# Patient Record
Sex: Female | Born: 1950 | Hispanic: No | Marital: Married | Smoking: Former smoker
Health system: Southern US, Community
[De-identification: ages and names within clinical notes are randomized; demographics above are authoritative.]

## PROBLEM LIST (undated history)

## (undated) DIAGNOSIS — I639 Cerebral infarction, unspecified: Secondary | ICD-10-CM

## (undated) DIAGNOSIS — C801 Malignant (primary) neoplasm, unspecified: Secondary | ICD-10-CM

## (undated) DIAGNOSIS — D649 Anemia, unspecified: Secondary | ICD-10-CM

## (undated) DIAGNOSIS — E119 Type 2 diabetes mellitus without complications: Secondary | ICD-10-CM

## (undated) DIAGNOSIS — I509 Heart failure, unspecified: Secondary | ICD-10-CM

## (undated) DIAGNOSIS — I10 Essential (primary) hypertension: Secondary | ICD-10-CM

## (undated) DIAGNOSIS — J449 Chronic obstructive pulmonary disease, unspecified: Secondary | ICD-10-CM

## (undated) DIAGNOSIS — F419 Anxiety disorder, unspecified: Secondary | ICD-10-CM

## (undated) DIAGNOSIS — J45909 Unspecified asthma, uncomplicated: Secondary | ICD-10-CM

## (undated) DIAGNOSIS — E785 Hyperlipidemia, unspecified: Secondary | ICD-10-CM

## (undated) HISTORY — DX: Anemia, unspecified: D64.9

## (undated) HISTORY — DX: Malignant (primary) neoplasm, unspecified: C80.1

## (undated) HISTORY — DX: Unspecified asthma, uncomplicated: J45.909

## (undated) HISTORY — DX: Chronic obstructive pulmonary disease, unspecified: J44.9

## (undated) HISTORY — DX: Essential (primary) hypertension: I10

## (undated) HISTORY — DX: Anxiety disorder, unspecified: F41.9

## (undated) HISTORY — DX: Cerebral infarction, unspecified: I63.9

## (undated) HISTORY — DX: Heart failure, unspecified: I50.9

## (undated) HISTORY — DX: Type 2 diabetes mellitus without complications: E11.9

## (undated) HISTORY — DX: Hyperlipidemia, unspecified: E78.5

## (undated) MED FILL — Dexamethasone Sodium Phosphate Inj 100 MG/10ML: INTRAMUSCULAR | Qty: 1 | Status: AC

---

## 2020-01-22 ENCOUNTER — Ambulatory Visit: Payer: Medicare Other | Attending: Internal Medicine

## 2020-01-22 DIAGNOSIS — Z23 Encounter for immunization: Secondary | ICD-10-CM | POA: Insufficient documentation

## 2020-01-22 NOTE — Progress Notes (Signed)
   Covid-19 Vaccination Clinic  Name:  Ndeye Tenorio    MRN: [id number] DOB: 12-Nov-1951  01/22/2020  Ms. Gebhardt was observed post Covid-19 immunization for 15 minutes without incidence. She was provided with Vaccine Information Sheet and instruction to access the V-Safe system.   Ms. Salazar was instructed to call 911 with any severe reactions post vaccine: Marland Kitchen Difficulty breathing  . Swelling of your face and throat  . A fast heartbeat  . A bad rash all over your body  . Dizziness and weakness    Immunizations Administered    Name Date Dose VIS Date Route   Pfizer COVID-19 Vaccine 01/22/2020 12:41 PM 0.3 mL 11/11/2019 Intramuscular   Manufacturer: [place]   Lot: [id number]   [place]: [address]-1

## 2020-02-14 ENCOUNTER — Ambulatory Visit: Payer: Medicare Other | Attending: Internal Medicine

## 2020-02-14 DIAGNOSIS — Z23 Encounter for immunization: Secondary | ICD-10-CM

## 2020-02-14 NOTE — Progress Notes (Signed)
   Covid-19 Vaccination Clinic  Name:  Jessica Stout    MRN: [id number] DOB: 05/31/1951  02/14/2020  [name] was observed post Covid-19 immunization for 15 minutes without incident. She was provided with Vaccine Information Sheet and instruction to access the V-Safe system.   [name] was instructed to call 911 with any severe reactions post vaccine: Marland Kitchen Difficulty breathing  . Swelling of face and throat  . A fast heartbeat  . A bad rash all over body  . Dizziness and weakness   Immunizations Administered    Name Date Dose VIS Date Route   Pfizer COVID-19 Vaccine 02/14/2020 10:34 AM 0.3 mL 11/11/2019 Intramuscular   Manufacturer: [place]   Lot: [id number]   [place]: [address]-3

## 2022-01-23 ENCOUNTER — Other Ambulatory Visit
Admission: RE | Admit: 2022-01-23 | Discharge: 2022-01-23 | Disposition: A | Discharge status: Home / Self Care | Payer: Medicare Other | Source: Ambulatory Visit | Attending: Cardiology | Admitting: Cardiology

## 2022-01-23 DIAGNOSIS — R000 Tachycardia, unspecified: Secondary | ICD-10-CM | POA: Insufficient documentation

## 2022-01-23 DIAGNOSIS — R0602 Shortness of breath: Secondary | ICD-10-CM | POA: Insufficient documentation

## 2022-01-23 DIAGNOSIS — I499 Cardiac arrhythmia, unspecified: Secondary | ICD-10-CM | POA: Insufficient documentation

## 2022-01-23 LAB — BRAIN NATRIURETIC PEPTIDE: B Natriuretic Peptide: 2004.6 pg/mL — ABNORMAL HIGH (ref 0.0–100.0)

## 2022-01-29 ENCOUNTER — Emergency Department: Payer: Medicare Other

## 2022-01-29 ENCOUNTER — Inpatient Hospital Stay
Admission: EM | Admit: 2022-01-29 | Discharge: 2022-02-05 | DRG: 291 | Disposition: A | Discharge status: Home / Self Care | Payer: Medicare Other | Attending: Internal Medicine | Admitting: Internal Medicine

## 2022-01-29 ENCOUNTER — Other Ambulatory Visit: Payer: Self-pay

## 2022-01-29 DIAGNOSIS — I5021 Acute systolic (congestive) heart failure: Secondary | ICD-10-CM | POA: Diagnosis present

## 2022-01-29 DIAGNOSIS — E119 Type 2 diabetes mellitus without complications: Secondary | ICD-10-CM | POA: Diagnosis present

## 2022-01-29 DIAGNOSIS — D630 Anemia in neoplastic disease: Secondary | ICD-10-CM | POA: Diagnosis present

## 2022-01-29 DIAGNOSIS — C184 Malignant neoplasm of transverse colon: Secondary | ICD-10-CM | POA: Diagnosis not present

## 2022-01-29 DIAGNOSIS — Z885 Allergy status to narcotic agent status: Secondary | ICD-10-CM

## 2022-01-29 DIAGNOSIS — K6389 Other specified diseases of intestine: Secondary | ICD-10-CM | POA: Diagnosis not present

## 2022-01-29 DIAGNOSIS — E782 Mixed hyperlipidemia: Secondary | ICD-10-CM

## 2022-01-29 DIAGNOSIS — D649 Anemia, unspecified: Secondary | ICD-10-CM

## 2022-01-29 DIAGNOSIS — K5909 Other constipation: Secondary | ICD-10-CM | POA: Diagnosis present

## 2022-01-29 DIAGNOSIS — E279 Disorder of adrenal gland, unspecified: Secondary | ICD-10-CM | POA: Diagnosis present

## 2022-01-29 DIAGNOSIS — K319 Disease of stomach and duodenum, unspecified: Secondary | ICD-10-CM | POA: Diagnosis present

## 2022-01-29 DIAGNOSIS — K3189 Other diseases of stomach and duodenum: Secondary | ICD-10-CM

## 2022-01-29 DIAGNOSIS — I110 Hypertensive heart disease with heart failure: Secondary | ICD-10-CM | POA: Diagnosis present

## 2022-01-29 DIAGNOSIS — J9811 Atelectasis: Secondary | ICD-10-CM | POA: Diagnosis present

## 2022-01-29 DIAGNOSIS — Z20822 Contact with and (suspected) exposure to covid-19: Secondary | ICD-10-CM | POA: Diagnosis present

## 2022-01-29 DIAGNOSIS — I509 Heart failure, unspecified: Secondary | ICD-10-CM

## 2022-01-29 DIAGNOSIS — K279 Peptic ulcer, site unspecified, unspecified as acute or chronic, without hemorrhage or perforation: Secondary | ICD-10-CM | POA: Diagnosis not present

## 2022-01-29 DIAGNOSIS — F32A Depression, unspecified: Secondary | ICD-10-CM | POA: Diagnosis present

## 2022-01-29 DIAGNOSIS — K449 Diaphragmatic hernia without obstruction or gangrene: Secondary | ICD-10-CM | POA: Diagnosis present

## 2022-01-29 DIAGNOSIS — Z794 Long term (current) use of insulin: Secondary | ICD-10-CM

## 2022-01-29 DIAGNOSIS — J449 Chronic obstructive pulmonary disease, unspecified: Secondary | ICD-10-CM | POA: Diagnosis present

## 2022-01-29 DIAGNOSIS — R0902 Hypoxemia: Secondary | ICD-10-CM | POA: Diagnosis not present

## 2022-01-29 DIAGNOSIS — Z8673 Personal history of transient ischemic attack (TIA), and cerebral infarction without residual deficits: Secondary | ICD-10-CM

## 2022-01-29 DIAGNOSIS — C189 Malignant neoplasm of colon, unspecified: Secondary | ICD-10-CM | POA: Diagnosis present

## 2022-01-29 DIAGNOSIS — E876 Hypokalemia: Secondary | ICD-10-CM | POA: Diagnosis present

## 2022-01-29 DIAGNOSIS — E785 Hyperlipidemia, unspecified: Secondary | ICD-10-CM | POA: Diagnosis present

## 2022-01-29 DIAGNOSIS — M7989 Other specified soft tissue disorders: Secondary | ICD-10-CM | POA: Diagnosis present

## 2022-01-29 DIAGNOSIS — Z9049 Acquired absence of other specified parts of digestive tract: Secondary | ICD-10-CM

## 2022-01-29 DIAGNOSIS — K644 Residual hemorrhoidal skin tags: Secondary | ICD-10-CM | POA: Diagnosis present

## 2022-01-29 DIAGNOSIS — Z7984 Long term (current) use of oral hypoglycemic drugs: Secondary | ICD-10-CM

## 2022-01-29 DIAGNOSIS — K5669 Other partial intestinal obstruction: Secondary | ICD-10-CM | POA: Diagnosis present

## 2022-01-29 DIAGNOSIS — K648 Other hemorrhoids: Secondary | ICD-10-CM | POA: Diagnosis present

## 2022-01-29 DIAGNOSIS — D509 Iron deficiency anemia, unspecified: Secondary | ICD-10-CM | POA: Diagnosis present

## 2022-01-29 DIAGNOSIS — A630 Anogenital (venereal) warts: Secondary | ICD-10-CM | POA: Diagnosis present

## 2022-01-29 DIAGNOSIS — J9601 Acute respiratory failure with hypoxia: Secondary | ICD-10-CM | POA: Diagnosis present

## 2022-01-29 DIAGNOSIS — F419 Anxiety disorder, unspecified: Secondary | ICD-10-CM | POA: Diagnosis present

## 2022-01-29 DIAGNOSIS — Z87891 Personal history of nicotine dependence: Secondary | ICD-10-CM

## 2022-01-29 DIAGNOSIS — D125 Benign neoplasm of sigmoid colon: Secondary | ICD-10-CM | POA: Diagnosis present

## 2022-01-29 DIAGNOSIS — K269 Duodenal ulcer, unspecified as acute or chronic, without hemorrhage or perforation: Secondary | ICD-10-CM

## 2022-01-29 DIAGNOSIS — E1169 Type 2 diabetes mellitus with other specified complication: Secondary | ICD-10-CM

## 2022-01-29 DIAGNOSIS — K635 Polyp of colon: Secondary | ICD-10-CM | POA: Diagnosis not present

## 2022-01-29 DIAGNOSIS — R296 Repeated falls: Secondary | ICD-10-CM | POA: Diagnosis present

## 2022-01-29 DIAGNOSIS — R918 Other nonspecific abnormal finding of lung field: Secondary | ICD-10-CM | POA: Diagnosis present

## 2022-01-29 DIAGNOSIS — Z79899 Other long term (current) drug therapy: Secondary | ICD-10-CM

## 2022-01-29 DIAGNOSIS — K5730 Diverticulosis of large intestine without perforation or abscess without bleeding: Secondary | ICD-10-CM | POA: Diagnosis present

## 2022-01-29 DIAGNOSIS — R188 Other ascites: Secondary | ICD-10-CM | POA: Diagnosis present

## 2022-01-29 DIAGNOSIS — R0602 Shortness of breath: Secondary | ICD-10-CM

## 2022-01-29 LAB — GLUCOSE, CAPILLARY: Glucose-Capillary: 150 mg/dL — ABNORMAL HIGH (ref 70–99)

## 2022-01-29 LAB — COMPREHENSIVE METABOLIC PANEL
ALT: 9 U/L (ref 0–44)
AST: 14 U/L — ABNORMAL LOW (ref 15–41)
Albumin: 3 g/dL — ABNORMAL LOW (ref 3.5–5.0)
Alkaline Phosphatase: 41 U/L (ref 38–126)
Anion gap: 8 (ref 5–15)
BUN: 14 mg/dL (ref 8–23)
CO2: 31 mmol/L (ref 22–32)
Calcium: 9.1 mg/dL (ref 8.9–10.3)
Chloride: 105 mmol/L (ref 98–111)
Creatinine, Ser: 0.75 mg/dL (ref 0.44–1.00)
GFR, Estimated: >60 mL/min (ref >60)
Glucose, Bld: 93 mg/dL (ref 70–99)
Potassium: 4.4 mmol/L (ref 3.5–5.1)
Sodium: 144 mmol/L (ref 135–145)
Total Bilirubin: 0.3 mg/dL (ref 0.3–1.2)
Total Protein: 6.3 g/dL — ABNORMAL LOW (ref 6.5–8.1)

## 2022-01-29 LAB — CBC
HCT: 32.7 % — ABNORMAL LOW (ref 36.0–46.0)
Hemoglobin: 9.3 g/dL — ABNORMAL LOW (ref 12.0–15.0)
MCH: 22.8 pg — ABNORMAL LOW (ref 26.0–34.0)
MCHC: 28.4 g/dL — ABNORMAL LOW (ref 30.0–36.0)
MCV: 80.1 fL (ref 80.0–100.0)
Platelets: 279 10*3/uL (ref 150–400)
RBC: 4.08 MIL/uL (ref 3.87–5.11)
RDW: 16.9 % — ABNORMAL HIGH (ref 11.5–15.5)
WBC: 9.1 10*3/uL (ref 4.0–10.5)
nRBC: 0 % (ref 0.0–0.2)

## 2022-01-29 LAB — TROPONIN I (HIGH SENSITIVITY)
Troponin I (High Sensitivity): 37 ng/L — ABNORMAL HIGH (ref <18)
Troponin I (High Sensitivity): 40 ng/L — ABNORMAL HIGH (ref <18)

## 2022-01-29 LAB — RESP PANEL BY RT-PCR (FLU A&B, COVID) ARPGX2
Influenza A by PCR: NEGATIVE
Influenza B by PCR: NEGATIVE
SARS Coronavirus 2 by RT PCR: NEGATIVE

## 2022-01-29 LAB — MAGNESIUM: Magnesium: 1.5 mg/dL — ABNORMAL LOW (ref 1.7–2.4)

## 2022-01-29 MED ORDER — SODIUM CHLORIDE 0.9 % IV SOLN: 250.0000 mL | INTRAVENOUS | Status: DC | PRN

## 2022-01-29 MED ORDER — ENOXAPARIN SODIUM 40 MG/0.4ML IJ SOSY
40.0000 mg | PREFILLED_SYRINGE | INTRAMUSCULAR | Status: DC
  Administered 2022-01-29: 40 mg via SUBCUTANEOUS
  Filled 2022-01-29: qty 0.4

## 2022-01-29 MED ORDER — INSULIN ASPART 100 UNIT/ML IJ SOLN: 0.0000 [IU] | Freq: Every day | INTRAMUSCULAR | Status: DC

## 2022-01-29 MED ORDER — SODIUM CHLORIDE 0.9% FLUSH
3.0000 mL | INTRAVENOUS | Status: DC | PRN
Start: 2022-01-29 — End: 2022-02-05

## 2022-01-29 MED ORDER — INSULIN ASPART 100 UNIT/ML IJ SOLN
0.0000 [IU] | Freq: Three times a day (TID) | INTRAMUSCULAR | Status: DC
  Administered 2022-01-30: 2 [IU] via SUBCUTANEOUS
  Administered 2022-01-31 (×2): 3 [IU] via SUBCUTANEOUS
  Administered 2022-02-01: 2 [IU] via SUBCUTANEOUS
  Administered 2022-02-03: 5 [IU] via SUBCUTANEOUS
  Administered 2022-02-04 – 2022-02-05 (×3): 2 [IU] via SUBCUTANEOUS
  Filled 2022-01-29 (×9): qty 1

## 2022-01-29 MED ORDER — VENLAFAXINE HCL ER 75 MG PO CP24
75.0000 mg | ORAL_CAPSULE | Freq: Every day | ORAL | Status: DC
  Administered 2022-01-30 – 2022-02-05 (×7): 75 mg via ORAL
  Filled 2022-01-29 (×8): qty 1

## 2022-01-29 MED ORDER — FUROSEMIDE 10 MG/ML IJ SOLN
40.0000 mg | INTRAMUSCULAR | Status: DC
  Administered 2022-01-29 – 2022-01-30 (×2): 40 mg via INTRAVENOUS
  Filled 2022-01-29 (×2): qty 4

## 2022-01-29 MED ORDER — FUROSEMIDE 10 MG/ML IJ SOLN
20.0000 mg | Freq: Once | INTRAMUSCULAR | Status: AC
  Administered 2022-01-29: 20 mg via INTRAVENOUS
  Filled 2022-01-29: qty 4

## 2022-01-29 MED ORDER — INSULIN ASPART 100 UNIT/ML IJ SOLN
3.0000 [IU] | Freq: Three times a day (TID) | INTRAMUSCULAR | Status: DC
  Administered 2022-01-30 – 2022-02-05 (×13): 3 [IU] via SUBCUTANEOUS
  Filled 2022-01-29 (×12): qty 1

## 2022-01-29 MED ORDER — IPRATROPIUM-ALBUTEROL 0.5-2.5 (3) MG/3ML IN SOLN: 3.0000 mL | RESPIRATORY_TRACT | Status: DC | PRN

## 2022-01-29 MED ORDER — ONDANSETRON HCL 4 MG/2ML IJ SOLN
4.0000 mg | Freq: Four times a day (QID) | INTRAMUSCULAR | Status: DC | PRN
  Administered 2022-02-02 – 2022-02-03 (×2): 4 mg via INTRAVENOUS
  Filled 2022-01-29 (×2): qty 2

## 2022-01-29 MED ORDER — SODIUM CHLORIDE 0.9% FLUSH
3.0000 mL | Freq: Two times a day (BID) | INTRAVENOUS | Status: DC
  Administered 2022-01-29 – 2022-01-30 (×2): 3 mL via INTRAVENOUS
  Administered 2022-01-30: 6 mL via INTRAVENOUS
  Administered 2022-01-31 – 2022-02-05 (×11): 3 mL via INTRAVENOUS

## 2022-01-29 MED ORDER — ACETAMINOPHEN 325 MG PO TABS
650.0000 mg | ORAL_TABLET | ORAL | Status: DC | PRN
  Administered 2022-01-30 – 2022-02-04 (×8): 650 mg via ORAL
  Filled 2022-01-29 (×8): qty 2

## 2022-01-29 MED ORDER — LOSARTAN POTASSIUM 25 MG PO TABS
25.0000 mg | ORAL_TABLET | Freq: Every day | ORAL | Status: DC
  Administered 2022-01-29 – 2022-02-05 (×8): 25 mg via ORAL
  Filled 2022-01-29 (×8): qty 1

## 2022-01-29 NOTE — ED Notes (Signed)
ECHOCARDIOGRAM - Not complete and marked completed by error ?

## 2022-01-29 NOTE — Assessment & Plan Note (Addendum)
No home controlled medications. No evidence of exacerbation.  ?- prn duoneb. ?

## 2022-01-29 NOTE — ED Triage Notes (Signed)
PT here with SOB. PT was sent her from her primary for anemia. Pt denies pain. Pt stable in triage. ?

## 2022-01-29 NOTE — ED Notes (Signed)
Pt placed on 2L BNC with sats at 95% ?

## 2022-01-29 NOTE — Assessment & Plan Note (Addendum)
HbA1c 6.5%.  ?- Hold home sitaglipitin, metformin ?- Covering with novolog 3u TIDWC + mod SSI. Remaining at inpatient goal without hypoglycemia. ?

## 2022-01-29 NOTE — H&P (Signed)
HISTORY AND PHYSICAL  Patient: Jessica Stout 71 y.o. female MRN: [id number]  Today is hospital day 0 after admission on 01/29/2022 11:45 AM  RECORD [place] COURSE: Jessica Stout is a 71 y.o. female who has been getting more more short of breath for the last 2 to 3 weeks.  She had some material she coughed up a couple weeks ago that was pinkish but it looked like the tea she drinks.  She also had 1 pinkish stool about 2 to 3 weeks ago.  She has not had any since.  She reports leg swelling as well.  She is not running a fever.  She is short of breath.  She does not have any chest pain.  She has some achy pain across the bottom of her ribs that it hurts when she pushes on it but it is not worse with breathing.  In ED EKG shows sinus tach at 119 with a rightward axis some PACs.  No obvious acute ST-T changes. Chest x-ray shows what appears to be CHF with some mild cardiomegaly. Recent BNP >2000. Treated w/ Lasix and O2 Reviewed recent cardiology notes: Seen at [facility] clinic 01/23/2022 for initial consult for evaluation for atrial fib, cardiology diagnosed with sinus tachycardia with frequent P AC.  At that time was reporting orthopnea and significant bilateral lower extremity edema  Procedures and Significant Results:  Echo pending  Consultants:  none    SUBJECTIVE:  Patient seen and examined in ED, resting comfortably.  Confirms above history, denies chest pain/shortness of breath at this time.     ASSESSMENT & PLAN  Congestive heart failure (CHF) (HCC) Diuresis ACE BB held for now per recent cardiology recs    Anemia Trending down past few months Will order retic ct, ferritin/TIBC/iron, hemoccult  Anxiety and depression Continue home meds effexor  Hyperlipidemia Lipid panel am  Chronic obstructive pulmonary disease ([place]) duoneb prn but current SOB more likely CHF related   Diabetes mellitus type 2, controlled, without complications ([place]) SSI initiated  inpatient  Hypoxia O2 supplementation will hopefully be able to wean off as diuresis is successful    VTE Ppx: lovenox CODE STATUS FULL  Admitted from: home Expected Dispo: home Barriers to discharge: continued medical workup and treatment, hypoxia  Family communication: pt asks I call husband, Jessica Stout             PMH: DM2, HTN, HLD, Anx/Dep  Home meds per recent cardiology note   albuterol 90 mcg/actuation inhaler As needed   atorvastatin (LIPITOR) 40 MG tablet Take 1 tablet by mouth once daily   fenofibrate 54 MG tablet Take 1 tablet by mouth once daily   FUROsemide (LASIX) 40 MG tablet Take 1 tablet (40 mg total) by mouth once daily 30 tablet 11   gabapentin (NEURONTIN) 600 MG tablet Take 1 tablet by mouth 3 (three) times a day   LANTUS SOLOSTAR U-100 INSULIN pen injector (concentration 100 units/mL)   losartan (COZAAR) 25 MG tablet Take 1 tablet by mouth once daily   losartan-hydrochlorothiazide (HYZAAR) 100-12.5 mg tablet Take 1 tablet by mouth once daily (???)  sitaGLIPtin-metFORMIN (JANUMET) 50-1,000 mg tablet Janumet 50 mg-1,000 mg tablet   traMADoL (ULTRAM) 50 mg tablet Take 1 tablet by mouth every 6 (six) hours   venlafaxine (EFFEXOR-XR) 75 MG XR capsule Take 1 capsule by mouth once daily    No family history on file. Social History:  has no history on file for tobacco use, alcohol use, and drug  use.  Allergies:  Allergies  Allergen Reactions   Codeine Rash    Happened in 1970s    (Not in a hospital admission)   Results for orders placed or performed during the hospital encounter of 01/29/22 (from the past 48 hour(s))  CBC     Status: Abnormal   Collection Time: 01/29/22 11:24 AM  Result Value Ref Range   WBC 9.1 4.0 - 10.5 K/uL   RBC 4.08 3.87 - 5.11 MIL/uL   Hemoglobin 9.3 (L) 12.0 - 15.0 g/dL   HCT 32.7 (L) 36.0 - 46.0 %   MCV 80.1 80.0 - 100.0 fL   MCH 22.8 (L) 26.0 - 34.0 pg   MCHC 28.4 (L) 30.0 - 36.0 g/dL   RDW 16.9 (H) 11.5 - 15.5 %    Platelets 279 150 - 400 K/uL   nRBC 0.0 0.0 - 0.2 %    Comment: Performed at [facility], [address], [place], [place] [address]  Comprehensive metabolic panel     Status: Abnormal   Collection Time: 01/29/22 11:24 AM  Result Value Ref Range   Sodium 144 135 - 145 mmol/L   Potassium 4.4 3.5 - 5.1 mmol/L   Chloride 105 98 - 111 mmol/L   CO2 31 22 - 32 mmol/L   Glucose, Bld 93 70 - 99 mg/dL    Comment: Glucose reference range applies only to samples taken after fasting for at least 8 hours.   BUN 14 8 - 23 mg/dL   Creatinine, Ser 0.75 0.44 - 1.00 mg/dL   Calcium 9.1 8.9 - 10.3 mg/dL   Total Protein 6.3 (L) 6.5 - 8.1 g/dL   Albumin 3.0 (L) 3.5 - 5.0 g/dL   AST 14 (L) 15 - 41 U/L   ALT 9 0 - 44 U/L   Alkaline Phosphatase 41 38 - 126 U/L   Total Bilirubin 0.3 0.3 - 1.2 mg/dL   GFR, Estimated >60 >60 mL/min    Comment: (NOTE) Calculated using the CKD-EPI Creatinine Equation (2021)    Anion gap 8 5 - 15    Comment: Performed at [facility], [address] [place], [place] [address]  Magnesium     Status: Abnormal   Collection Time: 01/29/22 11:24 AM  Result Value Ref Range   Magnesium 1.5 (L) 1.7 - 2.4 mg/dL    Comment: Performed at [facility], [place], [place] [address]  Troponin I (High Sensitivity)     Status: Abnormal   Collection Time: 01/29/22 11:24 AM  Result Value Ref Range   Troponin I (High Sensitivity) 37 (H) <18 ng/L    Comment: (NOTE) Elevated high sensitivity troponin I (hsTnI) values and significant  changes across serial measurements may suggest ACS but many other  chronic and acute conditions are known to elevate hsTnI results.  Refer to the "Links" section for chest pain algorithms and additional  guidance. Performed at [facility], [place], [place] [address]   Troponin I (High Sensitivity)     Status: Abnormal   Collection Time: 01/29/22  2:16 PM  Result Value Ref Range    Troponin I (High Sensitivity) 40 (H) <18 ng/L    Comment: (NOTE) Elevated high sensitivity troponin I (hsTnI) values and significant  changes across serial measurements may suggest ACS but many other  chronic and acute conditions are known to elevate hsTnI results.  Refer to the "Links" section for chest pain algorithms and additional  guidance. Performed at  [facility], [address] [place], [place] [address]    DG Chest 2 View  Result Date: 01/29/2022 CLINICAL DATA:  Shortness of breath. EXAM: CHEST - 2 VIEW COMPARISON:  None. FINDINGS: Mild cardiomegaly is noted. Mild central pulmonary vascular congestion is noted with probable bibasilar pulmonary edema and pleural effusions with associated atelectasis. Bony thorax is unremarkable. IMPRESSION: Mild cardiomegaly with central pulmonary vascular congestion and probable bilateral pulmonary edema and atelectasis and pleural effusions. Electronically Signed   By: Marijo Conception M.D.   On: 01/29/2022 11:58    Review of Systems  Constitutional:  Positive for fatigue and unexpected weight change. Negative for activity change and fever.  HENT:  Negative for sinus pressure and trouble swallowing.   Respiratory:  Positive for chest tightness and shortness of breath. Negative for apnea, cough and wheezing.   Cardiovascular:  Positive for leg swelling. Negative for chest pain and palpitations.  Gastrointestinal:  Negative for abdominal distention, abdominal pain, blood in stool, constipation and diarrhea.  Genitourinary:  Negative for difficulty urinating.  Musculoskeletal:  Negative for arthralgias.  Skin:  Negative for color change and rash.  Neurological:  Negative for dizziness and syncope.  Psychiatric/Behavioral:  Negative for dysphoric mood. The patient is not nervous/anxious.    Blood pressure 127/77, pulse (!) 101, temperature 98.1 F (36.7 C), temperature source Oral, resp. rate 16, height 5\' 4"  (1.626 m), weight 72.1 kg,  SpO2 98 %. Physical Exam Constitutional:      General: She is not in acute distress.    Appearance: She is well-developed and normal weight. She is not ill-appearing or toxic-appearing.  HENT:     Head: Normocephalic and atraumatic.     Mouth/Throat:     Mouth: Mucous membranes are moist.  Eyes:     Extraocular Movements: Extraocular movements intact.  Cardiovascular:     Rate and Rhythm: Normal rate and regular rhythm.  Pulmonary:     Effort: Pulmonary effort is normal.     Breath sounds: Examination of the right-middle field reveals rales. Examination of the left-middle field reveals rales. Examination of the right-lower field reveals rales. Examination of the left-lower field reveals rales. Rales present.  Chest:     Chest wall: No tenderness.  Abdominal:     Palpations: Abdomen is soft.     Tenderness: There is no abdominal tenderness.  Musculoskeletal:     Cervical back: Normal range of motion and neck supple.     Right lower leg: Edema present.     Left lower leg: Edema present.  Skin:    General: Skin is warm.  Neurological:     General: No focal deficit present.     Mental Status: She is alert and oriented to person, place, and time.  Psychiatric:        Mood and Affect: Mood normal.        Behavior: Behavior normal.      Emeterio Reeve, DO 01/29/2022, 7:03 PM

## 2022-01-29 NOTE — ED Notes (Signed)
Transport here at this time- pt leaving department  ?

## 2022-01-29 NOTE — ED Notes (Signed)
Transport requested at this time.

## 2022-01-29 NOTE — ED Notes (Signed)
Secure chat message sent to inpatient RN Estanislado Spire at this time  ?

## 2022-01-29 NOTE — Plan of Care (Signed)

## 2022-01-29 NOTE — ED Provider Notes (Signed)
? ?[facility] ?Provider Note ? ? ? Event Date/Time  ? First MD Initiated Contact with Patient 01/29/22 1218   ?  (approximate) ? ? ?History  ? ?Shortness of Breath ? ? ?HPI ? ?Jessica Stout is a 71 y.o. female who has been getting more more short of breath for the last 2 to 3 weeks.  She had some material she coughed up a couple weeks ago that was pinkish but it looked like the tea she drinks.  She also had 1 pinkish stool about 2 to 3 weeks ago.  She has not had any since.  She reports leg swelling as well.  She is not running a fever.  She is short of breath.  She does not have any chest pain.  She has some achy pain across the bottom of her ribs that it hurts when she pushes on it but it is not worse with breathing. ?  ? ? ?Physical Exam  ? ?Triage Vital Signs: ?ED Triage Vitals  ?Enc Vitals Group  ?   BP 01/29/22 1125 113/66  ?   Pulse Rate 01/29/22 1125 (!) 104  ?   Resp 01/29/22 1125 20  ?   Temp 01/29/22 1125 98.1 ?F (36.7 ?C)  ?   Temp Source 01/29/22 1125 Oral  ?   SpO2 01/29/22 1128 (!) 89 %  ?   Weight 01/29/22 1126 159 lb (72.1 kg)  ?   Height 01/29/22 1126 5\' 4"  (1.626 m)  ?   Head Circumference --   ?   Peak Flow --   ?   Pain Score 01/29/22 1126 0  ?   Pain Loc --   ?   Pain Edu? --   ?   Excl. in [place]? --   ? ? ?Most recent vital signs: ?Vitals:  ? 01/29/22 1430 01/29/22 1500  ?BP: 139/78 (!) 132/96  ?Pulse: (!) 105 (!) 108  ?Resp: 20 (!) 21  ?Temp:    ?SpO2: 93% 93%  ? ? ? ?General: Awake, no distress.  ?CV:  Good peripheral perfusion.  Heart regular rate and rhythm no audible murmurs there are occasional irregular beats. ?Resp:  Normal effort.  Lungs clear with some crackles in the bases ?Abd:  No distention.  Abdomen soft bowel sounds positive there is tenderness as described in HPI ?Extremities: Bilateral edema about 1+ ? ? ?ED Results / Procedures / Treatments  ? ?Labs ?(all labs ordered are listed, but only abnormal results are displayed) ?Labs Reviewed  ?CBC - Abnormal;  Notable for the following components:  ?    Result Value  ? Hemoglobin 9.3 (*)   ? HCT 32.7 (*)   ? MCH 22.8 (*)   ? MCHC 28.4 (*)   ? RDW 16.9 (*)   ? All other components within normal limits  ?COMPREHENSIVE METABOLIC PANEL - Abnormal; Notable for the following components:  ? Total Protein 6.3 (*)   ? Albumin 3.0 (*)   ? AST 14 (*)   ? All other components within normal limits  ?MAGNESIUM - Abnormal; Notable for the following components:  ? Magnesium 1.5 (*)   ? All other components within normal limits  ?TROPONIN I (HIGH SENSITIVITY) - Abnormal; Notable for the following components:  ? Troponin I (High Sensitivity) 37 (*)   ? All other components within normal limits  ?TROPONIN I (HIGH SENSITIVITY) - Abnormal; Notable for the following components:  ? Troponin I (High Sensitivity) 40 (*)   ? All  other components within normal limits  ? ? ? ?EKG ? ?EKG read interpreted by me shows sinus tach at 119 with a rightward axis some PACs.  No obvious acute ST-T changes ? ? ?RADIOLOGY ?Chest x-ray shows what appears to be CHF with some mild cardiomegaly.  I reviewed the films ? ? ?PROCEDURES: ? ?Critical Care performed:  ? ?Procedures ? ? ?MEDICATIONS ORDERED IN ED: ?Medications  ?furosemide (LASIX) injection 20 mg (20 mg Intravenous Given 01/29/22 1242)  ? ? ? ?IMPRESSION / MDM / ASSESSMENT AND PLAN / ED COURSE  ?I reviewed the triage vital signs and the nursing notes. ?Patient with gradual onset of CHF slight symptoms over the last few weeks.  She has never had this before.  She has leg swelling and shortness of breath.  Chest x-ray looks like CHF.  Her BNP is over 2000.  Her troponin is mildly elevated 37 went up to 40 over 2 hours.  This does not seem to be due to an acute heart attack.  Her GFR is good.  Her white count is good.  EKG does not show STEMI ? ?The patient is on the cardiac monitor to evaluate for evidence of arrhythmia and/or significant heart rate changes.  None have been seen ? ?I have given the patient  Lasix.  She is somewhat better but not much.  I believe we will have to get her in the hospital for further treatment and evaluation of this new onset CHF. ? ? ?FINAL CLINICAL IMPRESSION(S) / ED DIAGNOSES  ? ?Final diagnoses:  ?Hypoxia  ?Acute congestive heart failure, unspecified heart failure type ([place])  ? ? ? ?Rx / DC Orders  ? ?ED Discharge Orders   ? ? None  ? ?  ? ? ? ?Note:  This document was prepared using Dragon voice recognition software and may include unintentional dictation errors. ?  ?Nena Polio, MD ?01/29/22 1520 ? ?

## 2022-01-29 NOTE — Assessment & Plan Note (Addendum)
Microcytic and trending down past few months, due to iron deficiency and due to malignancy. Iron stores low, high TIBC, 5% sat. Bilirubin wnl. ?- IV iron given 3/3, repeated 3/6 per GI, will discharge on oral supplementation. ?- Hgb stabilized at 9g/dl. ?- With no active bleeding seen on endoscopic evaluations and current suspicion for malignancy, initiated prophylactic VTE ppx. ?- PPI will be continued per GI rec's ?

## 2022-01-29 NOTE — Assessment & Plan Note (Addendum)
Pt not taking rosuvastatin. No definitive PAD, CAD, though with DM, would qualify for statin regardless. LDL is 48. Will discuss with patient. ?

## 2022-01-29 NOTE — Assessment & Plan Note (Addendum)
LVEF 45-50%, mild-mod MR, IVC normal. ?- Continue lasix 40mg  IV BID (got an oral dose early this morning) since renal function stable and still with evidence of pulmonary edema. Recheck BMP, I/O, weights in AM. ?- ARB, BB ordered for GDMT. ?- Seen at [facility], appreciate recommendations. ?

## 2022-01-29 NOTE — Assessment & Plan Note (Addendum)
Quiescent.  ?- Continue home effexor ?

## 2022-01-29 NOTE — Assessment & Plan Note (Addendum)
Due to new diagnosis CHF with pulmonary edema. Has baseline COPD, though no wheezing to suggest exacerbation, just lowers pulmonary reserve. ?- Remains hypoxic with no PE on CTA chest. No consolidation. Suspect atelectasis is somewhat contributing though interstitial prominence and crackles on exam suggest continued pulmonary edema. Will give IV lasix x2 today and recheck ambulatory pulse oximetry in AM. ?

## 2022-01-30 ENCOUNTER — Inpatient Hospital Stay
Admit: 2022-01-30 | Discharge: 2022-01-30 | Disposition: A | Discharge status: Home / Self Care | Payer: Medicare Other | Attending: Osteopathic Medicine | Admitting: Osteopathic Medicine

## 2022-01-30 DIAGNOSIS — D509 Iron deficiency anemia, unspecified: Secondary | ICD-10-CM

## 2022-01-30 DIAGNOSIS — J449 Chronic obstructive pulmonary disease, unspecified: Secondary | ICD-10-CM | POA: Diagnosis not present

## 2022-01-30 DIAGNOSIS — J9601 Acute respiratory failure with hypoxia: Secondary | ICD-10-CM | POA: Diagnosis not present

## 2022-01-30 DIAGNOSIS — F419 Anxiety disorder, unspecified: Secondary | ICD-10-CM | POA: Diagnosis not present

## 2022-01-30 DIAGNOSIS — I509 Heart failure, unspecified: Secondary | ICD-10-CM | POA: Diagnosis not present

## 2022-01-30 LAB — RETICULOCYTES
Immature Retic Fract: 23 % — ABNORMAL HIGH (ref 2.3–15.9)
RBC.: 4.12 MIL/uL (ref 3.87–5.11)
Retic Count, Absolute: 104 10*3/uL (ref 19.0–186.0)
Retic Ct Pct: 2.5 % (ref 0.4–3.1)

## 2022-01-30 LAB — HEMOGLOBIN A1C
Hgb A1c MFr Bld: 6.5 % — ABNORMAL HIGH (ref 4.8–5.6)
Mean Plasma Glucose: 140 mg/dL

## 2022-01-30 LAB — FERRITIN: Ferritin: 11 ng/mL (ref 11–307)

## 2022-01-30 LAB — ECHOCARDIOGRAM COMPLETE
AR max vel: 2.17 cm2
AV Area VTI: 2.46 cm2
AV Area mean vel: 2.06 cm2
AV Mean grad: 1 mmHg
AV Peak grad: 2.2 mmHg
Ao pk vel: 0.75 m/s
Area-P 1/2: 6.43 cm2
Height: 64 in
MV VTI: 1.45 cm2
S' Lateral: 3.9 cm
Weight: 2544 oz

## 2022-01-30 LAB — CBC
HCT: 32.4 % — ABNORMAL LOW (ref 36.0–46.0)
Hemoglobin: 9.2 g/dL — ABNORMAL LOW (ref 12.0–15.0)
MCH: 22.4 pg — ABNORMAL LOW (ref 26.0–34.0)
MCHC: 28.4 g/dL — ABNORMAL LOW (ref 30.0–36.0)
MCV: 78.8 fL — ABNORMAL LOW (ref 80.0–100.0)
Platelets: 271 10*3/uL (ref 150–400)
RBC: 4.11 MIL/uL (ref 3.87–5.11)
RDW: 16.8 % — ABNORMAL HIGH (ref 11.5–15.5)
WBC: 9.4 10*3/uL (ref 4.0–10.5)
nRBC: 0 % (ref 0.0–0.2)

## 2022-01-30 LAB — IRON AND TIBC
Iron: 27 ug/dL — ABNORMAL LOW (ref 28–170)
Saturation Ratios: 5 % — ABNORMAL LOW (ref 10.4–31.8)
TIBC: 545 ug/dL — ABNORMAL HIGH (ref 250–450)
UIBC: 518 ug/dL

## 2022-01-30 LAB — GLUCOSE, CAPILLARY
Glucose-Capillary: 117 mg/dL — ABNORMAL HIGH (ref 70–99)
Glucose-Capillary: 132 mg/dL — ABNORMAL HIGH (ref 70–99)
Glucose-Capillary: 91 mg/dL (ref 70–99)
Glucose-Capillary: 128 mg/dL — ABNORMAL HIGH (ref 70–99)

## 2022-01-30 LAB — LIPID PANEL
Cholesterol: 108 mg/dL (ref 0–200)
HDL: 36 mg/dL — ABNORMAL LOW (ref >40)
LDL Cholesterol: 48 mg/dL (ref 0–99)
Total CHOL/HDL Ratio: 3 RATIO
Triglycerides: 119 mg/dL (ref <150)
VLDL: 24 mg/dL (ref 0–40)

## 2022-01-30 LAB — BASIC METABOLIC PANEL
Anion gap: 8 (ref 5–15)
BUN: 19 mg/dL (ref 8–23)
CO2: 34 mmol/L — ABNORMAL HIGH (ref 22–32)
Calcium: 9 mg/dL (ref 8.9–10.3)
Chloride: 104 mmol/L (ref 98–111)
Creatinine, Ser: 0.93 mg/dL (ref 0.44–1.00)
GFR, Estimated: >60 mL/min (ref >60)
Glucose, Bld: 114 mg/dL — ABNORMAL HIGH (ref 70–99)
Potassium: 4.6 mmol/L (ref 3.5–5.1)
Sodium: 146 mmol/L — ABNORMAL HIGH (ref 135–145)

## 2022-01-30 LAB — FOLATE: Folate: 12.9 ng/mL (ref >5.9)

## 2022-01-30 LAB — VITAMIN B12: Vitamin B-12: 558 pg/mL (ref 180–914)

## 2022-01-30 MED ORDER — SODIUM CHLORIDE 0.9 % IV SOLN
300.0000 mg | Freq: Once | INTRAVENOUS | Status: AC
  Administered 2022-01-31: 300 mg via INTRAVENOUS
  Filled 2022-01-30: qty 300

## 2022-01-30 MED ORDER — METOPROLOL SUCCINATE ER 25 MG PO TB24
25.0000 mg | ORAL_TABLET | Freq: Every day | ORAL | Status: DC
  Administered 2022-01-30 – 2022-02-05 (×7): 25 mg via ORAL
  Filled 2022-01-30 (×7): qty 1

## 2022-01-30 MED ORDER — FUROSEMIDE 10 MG/ML IJ SOLN
40.0000 mg | Freq: Two times a day (BID) | INTRAMUSCULAR | Status: AC
  Administered 2022-01-30 – 2022-01-31 (×3): 40 mg via INTRAVENOUS
  Filled 2022-01-30 (×3): qty 4

## 2022-01-30 MED ORDER — MAGNESIUM OXIDE -MG SUPPLEMENT 400 (240 MG) MG PO TABS
400.0000 mg | ORAL_TABLET | Freq: Two times a day (BID) | ORAL | Status: DC
  Administered 2022-01-30 – 2022-02-05 (×13): 400 mg via ORAL
  Filled 2022-01-30 (×13): qty 1

## 2022-01-30 NOTE — Consult Note (Cosign Needed)
[place] NOTE       Patient ID: Jessica Stout MRN: [id number] DOB/AGE: September 07, 1951 71 y.o.  Admit date: 01/29/2022 Referring Physician Dr. Vance Gather Primary Physician Dr. Hall Busing Primary Cardiologist Dr. Donnelly Angelica Reason for Consultation new onset CHF  HPI: The patient is a 71 year old female with a past medical history notable for type 2 diabetes, hypertension, sinus tachycardia with frequent PACs, asthma, colon polyps s/p colon resection (3 feet of colon resected 2002) who presented to [facility] ED 01/29/2022 with worsening shortness of breath.  She reports a 3 week history of worsening LEE and dyspnea with minimal exertion, unable to walk between rooms in her house without significant SOB and also extreme fatigue. She was seen by Dr. Corky Sox on 2/23 where her lasix was increased from 57m daily to 414mdaily and she did not notice much improvement in her swelling. She was seen by outpatient GI yesterday where she was having worsening of her fatigue and upper abdominal tenderness with significant heavy NSAID use (taking 2 or 3 Advil or Aleve per day for left leg neuropathy) and inpatient GI work-up was recommended. She admits to upper abdominal/left lower rib pain but denies pain in her chest. Admits to significant SOB and fatigue, and orthopnea. Denies palpitations or presyncope. She says her leg swelling has much improved after IV diuresis since she has been in the hospital.   BNP measured on 2/23 of 2000 and labs on admission were significant for creatinine of 0.75-0.93, EGFR greater than 60, magnesium 1.5, initial troponin without significant delta 37-40.  TC 108, HDL 36, LDL 48, Trigs 119.  Iron level 27, TIBC 545.  H&H 9.2/32.4, platelets 271 and microcytic anemia.  Chest x-ray notable for mild cardiomegaly with central pulmonary vascular congestion probable bilateral pulmonary edema and atelectasis and pleural effusions.  Review of systems complete and found to be negative  unless listed above   Medications Prior to Admission  Medication Sig Dispense Refill Last Dose   acetaminophen (TYLENOL) 325 MG tablet Take 650 mg by mouth every 6 (six) hours as needed.   Past Week   albuterol (VENTOLIN HFA) 108 (90 Base) MCG/ACT inhaler Inhale into the lungs.   Past Week   atorvastatin (LIPITOR) 40 MG tablet Take 40 mg by mouth daily.   01/28/2022   fenofibrate 54 MG tablet Take 54 mg by mouth daily.   01/28/2022   furosemide (LASIX) 40 MG tablet Take 40 mg by mouth daily.   01/28/2022   gabapentin (NEURONTIN) 600 MG tablet Take by mouth.   01/28/2022   JANUMET 50-1000 MG tablet Take 1 tablet by mouth daily.   01/28/2022   LANTUS SOLOSTAR 100 UNIT/ML Solostar Pen Inject 10 Units into the skin at bedtime.   01/28/2022   losartan (COZAAR) 25 MG tablet Take 25 mg by mouth daily.   01/28/2022   losartan-hydrochlorothiazide (HYZAAR) 100-12.5 MG tablet Take 1 tablet by mouth daily.   01/28/2022   meloxicam (MOBIC) 7.5 MG tablet Take 7.5 mg by mouth daily. (Patient not taking: Reported on 01/30/2022)   Not Taking   rosuvastatin (CRESTOR) 20 MG tablet Crestor 20 mg tablet (Patient not taking: Reported on 01/30/2022)   Not Taking   traMADol (ULTRAM) 50 MG tablet Take 50 mg by mouth 4 (four) times daily as needed.   01/28/2022   venlafaxine XR (EFFEXOR-XR) 75 MG 24 hr capsule Take 75 mg by mouth daily.   01/28/2022   Social History   Socioeconomic History   Marital  status: Unknown    Spouse name: Not on file   Number of children: Not on file   Years of education: Not on file   Highest education level: Not on file  Occupational History   Not on file  Tobacco Use   Smoking status: Not on file   Smokeless tobacco: Not on file  Substance and Sexual Activity   Alcohol use: Not on file   Drug use: Not on file   Sexual activity: Not on file  Other Topics Concern   Not on file  Social History Narrative   Not on file   Social Determinants of Health   Financial Resource Strain: Not on  file  Food Insecurity: Not on file  Transportation Needs: Not on file  Physical Activity: Not on file  Stress: Not on file  Social Connections: Not on file  Intimate Partner Violence: Not on file    No family history on file.    Review of systems complete and found to be negative unless listed above    PHYSICAL EXAM General: Elderly Caucasian female, well nourished, in no acute distress.  Sitting upright eating breakfast. HEENT:  Normocephalic and atraumatic. Neck:  No JVD.  Lungs: Some conversational dyspnea on oxygen by nasal cannula.  Poor air movement to bases, some bibasilar crackles  heart: Tachycardic RRR . Normal S1 and S2 without gallops or murmurs. Radial & DP pulses 2+ bilaterally. Abdomen: Soft, tight and distended.  Msk: Normal strength and tone for age. Extremities: Warm and well perfused. No clubbing, cyanosis.  1+ bilateral lower extremityedema  L >R .  Neuro: Alert and oriented X 3. Psych:  Answers questions appropriately.   Labs:   Lab Results  Component Value Date   WBC 9.4 01/30/2022   HGB 9.2 (L) 01/30/2022   HCT 32.4 (L) 01/30/2022   MCV 78.8 (L) 01/30/2022   PLT 271 01/30/2022    Recent Labs  Lab 01/29/22 1124 01/30/22 0524  NA 144 146*  K 4.4 4.6  CL 105 104  CO2 31 34*  BUN 14 19  CREATININE 0.75 0.93  CALCIUM 9.1 9.0  PROT 6.3*  --   BILITOT 0.3  --   ALKPHOS 41  --   ALT 9  --   AST 14*  --   GLUCOSE 93 114*   No results found for: CKTOTAL, CKMB, CKMBINDEX, TROPONINI  Lab Results  Component Value Date   CHOL 108 01/30/2022   Lab Results  Component Value Date   HDL 36 (L) 01/30/2022   Lab Results  Component Value Date   LDLCALC 48 01/30/2022   Lab Results  Component Value Date   TRIG 119 01/30/2022   Lab Results  Component Value Date   CHOLHDL 3.0 01/30/2022   No results found for: LDLDIRECT    Radiology: DG Chest 2 View  Result Date: 01/29/2022 CLINICAL DATA:  Shortness of breath. EXAM: CHEST - 2 VIEW COMPARISON:   None. FINDINGS: Mild cardiomegaly is noted. Mild central pulmonary vascular congestion is noted with probable bibasilar pulmonary edema and pleural effusions with associated atelectasis. Bony thorax is unremarkable. IMPRESSION: Mild cardiomegaly with central pulmonary vascular congestion and probable bilateral pulmonary edema and atelectasis and pleural effusions. Electronically Signed   By: Marijo Conception M.D.   On: 01/29/2022 11:58    ECHO no prior   TELEMETRY reviewed by me: Sinus rhythm with periods of tachycardia at 115  EKG reviewed by me: sinus tach with PACs rate 119  ASSESSMENT  AND PLAN:  The patient is a 70 year old female with a past medical history notable for type 2 diabetes, hypertension, sinus tachycardia with frequent PACs, asthma, colon polyps s/p colon resection (3 feet of colon resected 2002) who presented to [facility] ED 01/29/2022 with worsening shortness of breath.  #elevated BNP #shortness of breath  The patient presents with 3 weeks of worsening SOB, dyspnea with minimal exertion and abdominal and leg swelling with concern for new onset heart failure. She doubled her dose of lasix for 7 days without significant improvement.  Her BNP is markedly elevated to 2000, chest x-ray shows pleural effusions and central pulmonary vascular congestion and she is clinically grossly volume overloaded.  She feels like her swelling is much improved after IV diuresis thus far, but is still short of breath. -S/p 50m IV lasix x 1 and 40 mg IV Lasix x2 with great output.  -2.3 L yesterday -Continue 40 mg IV Lasix twice daily for now with close monitoring of renal function -Continue losartan 25 mg for GDMT, will likely initiate beta-blockade during this admission -Recommend strict I's and O's, daily weights,  -discussed fluid restriction to 1.5L per day and low salt diet -Ordered echocardiogram complete  #symptomatic anemia Hemoglobin measured 2/23 was 8.8, improved slightly to 9.2 today however  patient still has significant shortness of breath likely 2/2 pulmonary edema and ongoing anemia -appreciate GI recommendations  #hypertension #hyperlipidemia -continue atorvastatin 436mand medications as above.   This patient's plan of care was discussed and created with Dr. PaSaralyn Pilarnd he is in agreement.  Signed: LiTristan Schroeder PA-C 01/30/2022, 12:58 PM [facility]

## 2022-01-30 NOTE — Progress Notes (Signed)
*  PRELIMINARY RESULTS* ?Echocardiogram ?2D Echocardiogram has been performed. ? ?Jessica Stout, [place] Side ?01/30/2022, 2:41 PM ?

## 2022-01-30 NOTE — Progress Notes (Signed)
Nutrition Brief Note ? ?RD drawn to pt secondary to new CHF diagnosis.  ? ?Wt Readings from Last 15 Encounters:  ?01/29/22 72.1 kg  ? ?Jessica Stout is a 71 y.o. female with a history of T2DM, HTN, HLD, anxiety/depression who presented to the ED 3/1 with several weeks of dyspnea with exertion, worsening to dyspnea at rest associated with leg swelling and severe, progressive fatigue. She had been evaluated by cardiology recently, echocardiogram ordered and pending. GI referral was made due to significant anemia (hgb 9.3g/dl from previous 14g/dl), and an episode of pink stool. In the ED she was afebrile, newly hypoxemic, with sinus tachycardia. CXR with cardiomegaly, pulmonary edema, BNP recently >2000. Hgb IV lasix  ? ?Pt admitted with CHF.  ? ?Reviewed I/O's: -2.3 L x 24 hours ? ?UOP: 2.3 L x 24 hours ? ?Case discussed with RN; Heart Failure RN to see pt after each. Sent message to Heart Failure RN to notify that education process has been started.  ? ?Spoke with pt and husband at bedside. Pt shares that she has had nerve pain since May 20222 and this has greatly impacted her mobility. She usually consumes 2-3 meals per day. She and her husband cook at home and have been trying to decrease sodium in their diet. They use a lot of garlic and frozen vegetables.  ? ?Per pt, UBW is around 120#. She admits to some weight loss secondary to fluid.  ? ?Nutrition-Focused physical exam completed. Findings are no fat depletion, no muscle depletion, and mild edema.   ? ?RD provided "Low Sodium Nutrition Therapy" handout from the Academy of [employer] Dietetics. Reviewed patient's dietary recall. Provided examples on ways to decrease sodium intake in diet. Discouraged intake of processed foods and use of salt shaker. Encouraged fresh fruits and vegetables as well as whole grain sources of carbohydrates to maximize fiber intake.  ? ?RD discussed why it is important for patient to adhere to diet recommendations, and emphasized the  role of fluids, foods to avoid, and importance of weighing self daily. Teach back method used. ? ?Expect fair to good compliance. ? ?Labs reviewed: Na: 146, CBGS: 117-150 (inpatient orders for glycemic control are 0-15 units insulin aspart TID with meals, 0-5 units insulin aspart daily at bedtime, and 3 units insulin aspart TID with meals).   ? ?Current diet order is heart healthy/ carb modified, patient is consuming approximately n/a% of meals at this time. Labs and medications reviewed.  ? ?No nutrition interventions warranted at this time. If nutrition issues arise, please consult RD.  ? ?Loistine Chance, RD, LDN, CDCES ?Registered Dietitian II ?Certified Diabetes Care and Education Specialist ?Please refer to [facility] for RD and/or RD on-call/weekend/after hours pager   ?

## 2022-01-30 NOTE — Consult Note (Signed)
? ?  Heart Failure Nurse Navigator Note ? ?HF--echocardiogram results are pending at this time. ? ?She presented to the emergency room with worsening shortness of breath and lower extremity edema for proximately 2 to 3 weeks. ? ? ?Comorbidities: ? ?Type 2 diabetes ?Hypertension ?Hyperlipidemia ?Anxiety/depression ?Anemia ? ?Medications: ? ?Losartan 25 mg daily ?Furosemide 40 mg IV 2 times a day ? ?Labs: ? ?Sodium 146, potassium 4.6, chloride 104, CO2 34, BUN 19, creatinine 0.93, folate 12.9, hemoglobin 9.2, hematocrit 32.4, vitamin B12 558, total cholesterol 108 triglycerides 119, HDL 36, LDL 48.  BNP on admission was greater than 2000. ?Weight is 72.1 kg ?Blood pressure 141/72 ?Take not documented ?Output 2300 mL ? ?Initial meeting with patient and her husband who was at the bedside.  They state that the dietitian and had been in and talk to them about a low-sodium diet. ? ?Discussed the different types of heart failure since her echocardiogram has not been performed yet.  They voiced understand. ? ?She states that she was 1 that likes her salt but now realized portance of cutting back.  Explained the relationship between salt/sodium and liquids.  Discussed salt restriction of 2000 mg a day along with restricting fluid to 2 L or 64 ounces daily. ? ?Also talked about the importance of daily weight and what to report. ? ?Husband states that they very rarely eat and restaurant, he is one that does grocery shopping. ? ?Went over signs and symptoms to report, patient states since July she has not had the energy to clean her house.  And she states just prior to admission she did not have the strength to even take a shower and wash her hair.  She had also noted that her abdomen was larger and a decrease in her appetite. ? ?Discussed  the importance of follow-up in the outpatient heart failure clinic. ? ?She was given the living with heart failure teaching booklet, zone magnet and information on low-sodium.  Also given the  doctors low-sodium cookbook. ? ?She has an appointment on March 10 at 11:30 in the morning. ? ?Pricilla Riffle RN CHFN ? ?

## 2022-01-30 NOTE — Progress Notes (Signed)
?Progress Note ? ?Patient: Jessica Stout VZC:[id number] DOB: [birth year]  ?DOA: 01/29/2022  DOS: 01/30/2022  ?  ?Brief hospital course: ?Jessica Stout is a 71 y.o. female with a history of T2DM, HTN, HLD, anxiety/depression who presented to the ED 3/1 with several weeks of dyspnea with exertion, worsening to dyspnea at rest associated with leg swelling and severe, progressive fatigue. She had been evaluated by cardiology recently, echocardiogram ordered and pending. GI referral was made due to significant anemia (hgb 9.3g/dl from previous 14g/dl), and an episode of pink stool. In the ED she was afebrile, newly hypoxemic, with sinus tachycardia. CXR with cardiomegaly, pulmonary edema, BNP recently >2000. Hgb IV lasix  ? ?Assessment and Plan: ?* Congestive heart failure (CHF) ([place]) ?- Continue lasix 40mg  IV TID, monitor I/O, daily weights, BMP.  ?- ARB is reordered (note redundant ARB and ACEi on home meds), though may consider holding for now with creatinine bump, need for diuresis. Defer to cards, received dose this AM. ?- Echocardiogram pending for characterization. Defer specific GDMT titration pending this result. ?- Seen at [facility], consulted for further recommendations.  ? ? ?Iron deficiency anemia- (present on admission) ?Microcytic and trending down past few months, possible GI bleed vs. nutritional deficiency. Iron stores low, high TIBC, 5% sat. Bilirubin wnl. ?- Agree with IV iron and initiating oral supplementation ongoing thereafter ?- GI consulted, d/w Dr. Marius Stout, will pursue evaluation for GI bleeding with EGD, colonoscopy, once respiratory status is stable. ?- Check vitamin J28, folic acid.  ?- With concern for GI bleed, use SCDs for VTE ppx for now. ? ?Acute respiratory failure with hypoxia ([place])- (present on admission) ?Due to suspected new diagnosis CHF with pulmonary edema. Has baseline COPD, though no wheezing to suggest exacerbation, just lowers pulmonary reserve. ?- Diurese as above ?- Still  with crackles on exam. Will wean to room air as tolerated. If unable to do so after diuresis or RV appears overloaded on echo, would need to evaluate for PE. ? ?Anxiety and depression ?Quiescent.  ?- Continue home effexor ? ?Hyperlipidemia ?Pt not taking rosuvastatin. No definitive PAD, CAD, though with DM, would qualify for statin regardless. LDL is 48. Will discuss with patient. ? ?Chronic obstructive pulmonary disease ([place])- (present on admission) ?No home controlled medications. No evidence of exacerbation.  ?- prn duoneb. ? ?Diabetes mellitus type 2, controlled, without complications ([place]) ?- HbA1c pending ?- Hold home sitaglipitin, metformin ?- Covering with novolog 3u TIDWC + mod SSI. May need to reinitiate home basal insulin.  ? ? ?Subjective: Breathing slightly improved, though still short of breath even with just talking at rest. Wants to get up and move around more. Feels some muscular strain on left lower chest, not exertional or pleuritic. No bleeding noted. Worried about colon CA due to +FH and her personal scare 15 years ago requiring partial colectomy (negative pathology). ? ?Objective: ?Vitals:  ? 01/29/22 2350 01/30/22 0210 01/30/22 0425 01/30/22 0739  ?BP: 131/69 (!) 144/84 (!) 143/80 137/76  ?Pulse: (!) 112 (!) 112 (!) 104 (!) 106  ?Resp: 17  18   ?Temp: 97.8 ?F (36.6 ?C)  (!) 97.5 ?F (36.4 ?C) (!) 97.5 ?F (36.4 ?C)  ?TempSrc: Oral  Oral Oral  ?SpO2: 95% 96% 96% 92%  ?Weight:      ?Height:      ? ?Gen: Pleasant 71 y.o. female in no distress ?Pulm: Tachypneic, mildly labored with conversation at rest, crackles at bilateral bases, no wheezes ?CV: Regular tachycardia without murmur, rub, or gallop. No  JVD, 1+ symmetric dependent edema. ?GI: Abdomen soft, non-tender, non-distended, with normoactive bowel sounds. Mild tenderness related to significant scar formation. ?Ext: Warm, no deformities ?Skin: No rashes, lesions or ulcers on visualized skin. ?Neuro: Alert and oriented. No focal neurological  deficits. ?Psych: Judgement and insight appear fair. Mood euthymic & affect congruent. Behavior is appropriate.   ? ?Data Personally reviewed: ?Hgb 9.3 > 9.2, microcytic indices, ferritin 11, iron 27, TIBC 545, 5% sat, Na 146, SCr 0.75 > 0.93. LFTs wnl, TBili 0.3. LDL 48, HDL 36.  ?Glucose at inpatient goal.  ? ?CXR (personal review): Cardiomegaly with vascular congestion centrally, suggestion of effusions.  ?   ?SARS-CoV-2 PCR (3/1): Negative ?Influenza A/B: Negative ? ?Family Communication: None at bedside ? ?Disposition: ?Status is: Inpatient ?Remains inpatient appropriate because: Ongoing IV diuresis, GI bleed work up ?Planned Discharge Destination: Home ? ?Jessica Pour, MD ?01/30/2022 9:57 AM ?Page by [email or web address]  ?

## 2022-01-30 NOTE — Discharge Instructions (Signed)
Low Sodium Nutrition Therapy  Eating less sodium can help you if you have high blood pressure, heart failure, or kidney or liver disease.   Your body needs a little sodium, but too much sodium can cause your body to hold onto extra water. This extra water will raise your blood pressure and can cause damage to your heart, kidneys, or liver as they are forced to work harder.   Sometimes you can see how the extra fluid affects you because your hands, legs, or belly swell. You may also hold water around your heart and lungs, which makes it hard to breathe.   Even if you take medication for blood pressure or a water pill (diuretic) to remove fluid, it is still important to have less salt in your diet.   Check with your primary care provider before drinking alcohol since it may affect the amount of fluid in your body and how your heart, kidneys, or liver work. Sodium in Food A low-sodium meal plan limits the sodium that you get from food and beverages to 1,500-2,000 milligrams (mg) per day. Salt is the main source of sodium. Read the nutrition label on the package to find out how much sodium is in one serving of a food.  Select foods with 140 milligrams (mg) of sodium or less per serving.  You may be able to eat one or two servings of foods with a little more than 140 milligrams (mg) of sodium if you are closely watching how much sodium you eat in a day.  Check the serving size on the label. The amount of sodium listed on the label shows the amount in one serving of the food. So, if you eat more than one serving, you will get more sodium than the amount listed.  Tips Cutting Back on Sodium Eat more fresh foods.  Fresh fruits and vegetables are low in sodium, as well as frozen vegetables and fruits that have no added juices or sauces.  Fresh meats are lower in sodium than processed meats, such as bacon, sausage, and hotdogs.  Not all processed foods are unhealthy, but some processed foods may have too  much sodium.  Eat less salt at the table and when cooking. One of the ingredients in salt is sodium.  One teaspoon of table salt has 2,300 milligrams of sodium.  Leave the salt out of recipes for pasta, casseroles, and soups. Be a Paramedic.  Food packages that say Salt-free, sodium-free, very low sodium, and low sodium have less than 140 milligrams of sodium per serving.  Beware of products identified as Unsalted, No Salt Added, Reduced Sodium, or Lower Sodium. These items may still be high in sodium. You should always check the nutrition label. Add flavors to your food without adding sodium.  Try lemon juice, lime juice, or vinegar.  Dry or fresh herbs add flavor.  Buy a sodium-free seasoning blend or make your own at home. You can purchase salt-free or sodium-free condiments like barbeque sauce in stores and online. Ask your registered dietitian nutritionist for recommendations and where to find them.   Eating in Restaurants Choose foods carefully when you eat outside your home. Restaurant foods can be very high in sodium. Many restaurants provide nutrition facts on their menus or their websites. If you cannot find that information, ask your server. Let your server know that you want your food to be cooked without salt and that you would like your salad dressing and sauces to be served on the  side.    Foods Recommended Food Group Foods Recommended  Grains Bread, bagels, rolls without salted tops Homemade bread made with reduced-sodium baking powder Cold cereals, especially shredded wheat and puffed rice Oats, grits, or cream of wheat Pastas, quinoa, and rice Popcorn, pretzels or crackers without salt Corn tortillas  Protein Foods Fresh meats and fish; [place] bacon (check the nutrition labels - make sure they are not packaged in a sodium solution) Canned or packed tuna (no more than 4 ounces at 1 serving) Beans and peas Soybeans) and tofu Eggs Nuts or nut butters  without salt  Dairy Milk or milk powder Plant milks, such as rice and soy Yogurt, including Greek yogurt Small amounts of natural cheese (blocks of cheese) or reduced-sodium cheese can be used in moderation. (Swiss, ricotta, and fresh mozzarella cheese are lower in sodium than the others) Cream Cheese Low sodium cottage cheese  Vegetables Fresh and frozen vegetables without added sauces or salt Homemade soups (without salt) Low-sodium, salt-free or sodium-free canned vegetables and soups  Fruit Fresh and canned fruits Dried fruits, such as raisins, cranberries, and prunes  Oils Tub or liquid margarine, regular or without salt Canola, corn, peanut, olive, safflower, or sunflower oils  Condiments Fresh or dried herbs such as basil, bay leaf, dill, mustard (dry), nutmeg, paprika, parsley, rosemary, sage, or thyme.  Low sodium ketchup Vinegar  Lemon or lime juice Pepper, red pepper flakes, and cayenne. Hot sauce contains sodium, but if you use just a drop or two, it will not add up to much.  Salt-free or sodium-free seasoning mixes and marinades Simple salad dressings: vinegar and oil   Foods Not Recommended Food Group Foods Not Recommended  Grains Breads or crackers topped with salt Cereals (hot/cold) with more than 300 mg sodium per serving Biscuits, cornbread, and other quick breads prepared with baking soda Pre-packaged bread crumbs Seasoned and packaged rice and pasta mixes Self-rising flours  Protein Foods Cured meats: Bacon, ham, sausage, pepperoni and hot dogs Canned meats (chili, vienna sausage, or sardines) Smoked fish and meats Frozen meals that have more than 600 mg of sodium per serving Egg substitute (with added sodium)  Dairy Buttermilk Processed cheese spreads Cottage cheese (1 cup may have over 500 mg of sodium; look for low-sodium.) American or feta cheese Shredded Cheese has more sodium than blocks of cheese String cheese  Vegetables Canned vegetables  (unless they are salt-free, sodium-free or low sodium) Frozen vegetables with seasoning and sauces Sauerkraut and pickled vegetables Canned or dried soups (unless they are salt-free, sodium-free, or low sodium) [place] fries and onion rings  Fruit Dried fruits preserved with additives that have sodium  Oils Salted butter or margarine, all types of olives  Condiments Salt, sea salt, kosher salt, onion salt, and garlic salt Seasoning mixes with salt Bouillon cubes Ketchup Barbeque sauce and Worcestershire sauce unless low sodium Soy sauce Salsa, pickles, olives, relish Salad dressings: ranch, blue cheese, [place], and [place].   Low Sodium Sample 1-Day Menu  Breakfast 1 cup cooked oatmeal  1 slice whole wheat bread toast  1 tablespoon peanut butter without salt  1 banana  1 cup 1% milk  Lunch Tacos made with: 2 corn tortillas   cup black beans, low sodium   cup roasted or grilled chicken (without skin)   avocado  Squeeze of lime juice  1 cup salad greens  1 tablespoon low-sodium salad dressing   cup strawberries  1 orange  Afternoon Snack 1/3 cup grapes  6 ounces yogurt  Evening Meal 3 ounces herb-baked fish  1 baked potato  2 teaspoons olive oil   cup cooked carrots  2 thick slices tomatoes on:  2 lettuce leaves  1 teaspoon olive oil  1 teaspoon balsamic vinegar  1 cup 1% milk  Evening Snack 1 apple   cup almonds without salt   Low-Sodium Vegetarian (Lacto-Ovo) Sample 1-Day Menu  Breakfast 1 cup cooked oatmeal  1 slice whole wheat toast  1 tablespoon peanut butter without salt  1 banana  1 cup 1% milk  Lunch Tacos made with: 2 corn tortillas   cup black beans, low sodium   cup roasted or grilled chicken (without skin)   avocado  Squeeze of lime juice  1 cup salad greens  1 tablespoon low-sodium salad dressing   cup strawberries  1 orange  Evening Meal Stir fry made with:  cup tofu  1 cup brown rice   cup broccoli   cup green beans   cup  peppers   tablespoon peanut oil  1 orange  1 cup 1% milk  Evening Snack 4 strips celery  2 tablespoons hummus  1 hard-boiled egg   Low-Sodium Vegan Sample 1-Day Menu  Breakfast 1 cup cooked oatmeal  1 tablespoon peanut butter without salt  1 cup blueberries  1 cup soymilk fortified with calcium, vitamin B12, and vitamin D  Lunch 1 small whole wheat pita   cup cooked lentils  2 tablespoons hummus  4 carrot sticks  1 medium apple  1 cup soymilk fortified with calcium, vitamin B12, and vitamin D  Evening Meal Stir fry made with:  cup tofu  1 cup brown rice   cup broccoli   cup green beans   cup peppers   tablespoon peanut oil  1 cup cantaloupe  Evening Snack 1 cup soy yogurt   cup mixed nuts  Copyright 2020  Academy of [employer] and Dietetics. All rights reserved  Sodium Free Flavoring Tips  When cooking, the following items may be used for flavoring instead of salt or seasonings that contain sodium. Remember: A little bit of spice goes a long way! Be careful not to overseason. Spice Blend Recipe (makes about ? cup) 5 teaspoons onion powder  2 teaspoons garlic powder  2 teaspoons paprika  2 teaspoon dry mustard  1 teaspoon crushed thyme leaves   teaspoon white pepper   teaspoon celery seed Food Item Flavorings  Beef Basil, bay leaf, caraway, curry, dill, dry mustard, garlic, grape jelly, green pepper, mace, marjoram, mushrooms (fresh), nutmeg, onion or onion powder, parsley, pepper, rosemary, sage  Chicken Basil, cloves, cranberries, mace, mushrooms (fresh), nutmeg, oregano, paprika, parsley, pineapple, saffron, sage, savory, tarragon, thyme, tomato, turmeric  Egg Chervil, curry, dill, dry mustard, garlic or garlic powder, green pepper, jelly, mushrooms (fresh), nutmeg, onion powder, paprika, parsley, rosemary, tarragon, tomato  Fish Basil, bay leaf, chervil, curry, dill, dry mustard, green pepper, lemon juice, marjoram, mushrooms (fresh), paprika, pepper,  tarragon, tomato, turmeric  Lamb Cloves, curry, dill, garlic or garlic powder, mace, mint, mint jelly, onion, oregano, parsley, pineapple, rosemary, tarragon, thyme  Pork Applesauce, basil, caraway, chives, cloves, garlic or garlic powder, onion or onion powder, rosemary, thyme  Veal Apricots, basil, bay leaf, currant jelly, curry, ginger, marjoram, mushrooms (fresh), oregano, paprika  Vegetables Basil, dill, garlic or garlic powder, ginger, lemon juice, mace, marjoram, nutmeg, onion or onion powder, tarragon, tomato, sugar or sugar substitute, salt-free salad dressing, vinegar  Desserts Allspice, anise, cinnamon, cloves, ginger, mace, nutmeg, vanilla extract, other  extracts   Copyright 2020  Academy of Nutrition and Dietetics. All rights reserved

## 2022-01-30 NOTE — Consult Note (Signed)
Cephas Darby, MD [address]  [place]  [place], [place] [address]  Main: [phone]  Fax: [phone] Pager: [phone]   Consultation  Referring Provider:     No ref. provider found Primary Care Physician:  Patient, No Pcp Per (Inactive) Primary Gastroenterologist:  Dr. Haig Prophet         Reason for Consultation:     Iron deficiency anemia  Date of Admission:  01/29/2022 Date of Consultation:  01/30/2022         HPI:   Jessica Stout is a 71 y.o. female history of multiple comorbidities including diabetes, hypertension, history of COPD, CHF, hyperlipidemia who is admitted with congestive heart failure in setting of severe iron deficiency anemia.  Patient was evaluated by [facility] clinic gastroenterologist, Dr. Haig Prophet in the office yesterday, she was symptomatic with shortness of breath and tachypneic, therefore sent to ER.  She got admitted yesterday.  Her BNP was 2004 on 01/23/2022.  She was also evaluated by cardiology as outpatient.  Started on diuretics.  Labs revealed hemoglobin 9.2, MCV 78.8, BUN/creatinine normal 19/0.93, ferritin 11, folate 12.9.  Chest x-ray in the ER revealed bilateral pulmonary venous congestion, probable bilateral pulmonary edema with atelectasis and pleural effusions, mild cardiomegaly.  Patient is started on diuretics.  TSH normal,, mildly elevated troponin.  Oxygenating 92% on 3 L nasal cannula.  Patient is not on home oxygen.  She does have intermittent smoking history.  Patient reports that her shortness of breath has not improved much.  Her swelling of legs is improving.  She denies any black stools, rectal bleeding.  She does report chronic constipation for which she takes over-the-counter laxatives.  She denies any abdominal pain or distention.  She reports feeling tired.   NSAIDs: None  Antiplts/Anticoagulants/Anti thrombotics: None  GI Procedures: Reports undergoing partial colectomy, 3 feet of the colon removed in 2002 for history of  colon polyps.  Reports undergoing endoscopy procedures several years ago   Prior to Admission medications   Medication Sig Start Date End Date Taking? Authorizing Provider  albuterol (VENTOLIN HFA) 108 (90 Base) MCG/ACT inhaler Inhale into the lungs. 01/19/22   [provider]  atorvastatin (LIPITOR) 40 MG tablet Take 40 mg by mouth daily. 12/12/21   [provider]  fenofibrate 54 MG tablet Take 54 mg by mouth daily. 12/12/21   [provider]  furosemide (LASIX) 40 MG tablet Take 40 mg by mouth daily. 01/23/22   [provider]  gabapentin (NEURONTIN) 600 MG tablet Take by mouth. 01/01/22   [provider]  JANUMET 50-1000 MG tablet Take 1 tablet by mouth daily. 11/24/21   [provider]  LANTUS SOLOSTAR 100 UNIT/ML Solostar Pen Inject 10 Units into the skin at bedtime. 01/17/22   [provider]  losartan (COZAAR) 25 MG tablet Take 25 mg by mouth daily. 12/12/21   [provider]  losartan-hydrochlorothiazide (HYZAAR) 100-12.5 MG tablet Take 1 tablet by mouth daily.    [provider]  rosuvastatin (CRESTOR) 20 MG tablet Crestor 20 mg tablet    [provider]  traMADol (ULTRAM) 50 MG tablet Take 50 mg by mouth 4 (four) times daily as needed. 01/20/22   [provider]  venlafaxine XR (EFFEXOR-XR) 75 MG 24 hr capsule Take 75 mg by mouth daily. 12/07/21   [provider]    No family history on file.     Current Facility-Administered Medications:    0.9 %  sodium chloride infusion,  250 mL, Intravenous, PRN, Emeterio Reeve, DO   acetaminophen (TYLENOL) tablet 650 mg, 650 mg, Oral, Q4H PRN, Emeterio Reeve, DO, 650 mg at 01/30/22 0231   furosemide (LASIX) injection 40 mg, 40 mg, Intravenous, BID, Tang, Alanson Puls, PA-C   insulin aspart (novoLOG) injection 0-15 Units, 0-15 Units, Subcutaneous, TID WC, Emeterio Reeve, DO, 2 Units at 01/30/22 1214   insulin aspart (novoLOG)  injection 0-5 Units, 0-5 Units, Subcutaneous, QHS, Alexander, Natalie, DO   insulin aspart (novoLOG) injection 3 Units, 3 Units, Subcutaneous, TID WC, Emeterio Reeve, DO, 3 Units at 01/30/22 1214   ipratropium-albuterol (DUONEB) 0.5-2.5 (3) MG/3ML nebulizer solution 3 mL, 3 mL, Nebulization, Q4H PRN, Emeterio Reeve, DO   [START ON 01/31/2022] iron sucrose (VENOFER) 300 mg in sodium chloride 0.9 % 250 mL IVPB, 300 mg, Intravenous, Once, Ellarae Nevitt, Tally Due, MD   losartan (COZAAR) tablet 25 mg, 25 mg, Oral, Daily, Emeterio Reeve, DO, 25 mg at 01/30/22 0855   ondansetron (ZOFRAN) injection 4 mg, 4 mg, Intravenous, Q6H PRN, Emeterio Reeve, DO   sodium chloride flush (NS) 0.9 % injection 3 mL, 3 mL, Intravenous, Q12H, Emeterio Reeve, DO, 6 mL at 01/30/22 0855   sodium chloride flush (NS) 0.9 % injection 3 mL, 3 mL, Intravenous, PRN, Emeterio Reeve, DO   venlafaxine XR (EFFEXOR-XR) 24 hr capsule 75 mg, 75 mg, Oral, Q breakfast, Emeterio Reeve, DO, 75 mg at 01/30/22 6213   Allergies as of 01/29/2022 - Review Complete 01/29/2022  Allergen Reaction Noted   Codeine Rash 01/29/2022    Review of Systems:    All systems reviewed and negative except where noted in HPI.   Physical Exam:  Vital signs in last 24 hours: Temp:  [97.5 F (36.4 C)-97.8 F (36.6 C)] 97.5 F (36.4 C) (03/02 1132) Pulse Rate:  [55-112] 58 (03/02 1132) Resp:  [15-22] 18 (03/02 0425) BP: (121-151)/(61-96) 141/72 (03/02 1132) SpO2:  [91 %-98 %] 94 % (03/02 1132) Last BM Date : 01/29/22 General:   Pleasant, thin built, cooperative in NAD Head:  Normocephalic and atraumatic. Eyes:   No icterus.   Conjunctiva pink. PERRLA. Ears:  Normal auditory acuity. Neck:  Supple; no masses or thyroidomegaly Lungs: Respirations even and unlabored. Lungs crackles bilaterally.   Heart:  Regular rate and rhythm;  Without murmur, clicks, rubs or gallops Abdomen:  Soft, nondistended, nontender. Normal bowel sounds. No  appreciable masses or hepatomegaly.  No rebound or guarding.  Rectal:  Not performed. Msk:  Symmetrical without gross deformities.  Strength generalized weakness Extremities:  1+ edema, no cyanosis or clubbing. Neurologic:  Alert and oriented x3;  grossly normal neurologically. Skin:  Intact without significant lesions or rashes. Psych:  Alert and cooperative. Normal affect.  LAB RESULTS: CBC Latest Ref Rng & Units 01/30/2022 01/29/2022  WBC 4.0 - 10.5 K/uL 9.4 9.1  Hemoglobin 12.0 - 15.0 g/dL 9.2(L) 9.3(L)  Hematocrit 36.0 - 46.0 % 32.4(L) 32.7(L)  Platelets 150 - 400 K/uL 271 279    BMET BMP Latest Ref Rng & Units 01/30/2022 01/29/2022  Glucose 70 - 99 mg/dL 114(H) 93  BUN 8 - 23 mg/dL 19 14  Creatinine 0.44 - 1.00 mg/dL 0.93 0.75  Sodium 135 - 145 mmol/L 146(H) 144  Potassium 3.5 - 5.1 mmol/L 4.6 4.4  Chloride 98 - 111 mmol/L 104 105  CO2 22 - 32 mmol/L 34(H) 31  Calcium 8.9 - 10.3 mg/dL 9.0 9.1    LFT Hepatic Function Latest Ref Rng & Units 01/29/2022  Total Protein 6.5 -  8.1 g/dL 6.3(L)  Albumin 3.5 - 5.0 g/dL 3.0(L)  AST 15 - 41 U/L 14(L)  ALT 0 - 44 U/L 9  Alk Phosphatase 38 - 126 U/L 41  Total Bilirubin 0.3 - 1.2 mg/dL 0.3     STUDIES: DG Chest 2 View  Result Date: 01/29/2022 CLINICAL DATA:  Shortness of breath. EXAM: CHEST - 2 VIEW COMPARISON:  None. FINDINGS: Mild cardiomegaly is noted. Mild central pulmonary vascular congestion is noted with probable bibasilar pulmonary edema and pleural effusions with associated atelectasis. Bony thorax is unremarkable. IMPRESSION: Mild cardiomegaly with central pulmonary vascular congestion and probable bilateral pulmonary edema and atelectasis and pleural effusions. Electronically Signed   By: Marijo Conception M.D.   On: 01/29/2022 11:58      Impression / Plan:   Jessica Stout is a 71 y.o. female with history of metabolic syndrome is admitted with congestive heart failure in setting of severe symptomatic anemia with no evidence of  active GI bleed  Iron deficiency anemia No evidence of active GI bleed, normal BUN/creatinine, no evidence of chronic liver disease Hemoglobin is 9.2, maintain above 8 Severe iron deficiency, recommend IV iron Check B12 levels, normal folate levels Patient will need bidirectional endoscopy +/- video capsule endoscopy when she is medically stable from cardiorespiratory standpoint Diet as tolerated Recommend to obtain cardiology clearance when patient is ready to undergo endoscopic procedures while inpatient.  Otherwise, follow-up with [facility] clinic gastroenterology as outpatient to determine timing of endoscopic evaluation once her CHF is well controlled   Thank you for involving me in the care of this patient.      LOS: 1 day   Sherri Sear, MD  01/30/2022, 1:15 PM    Note: This dictation was prepared with Dragon dictation along with smaller phrase technology. Any transcriptional errors that result from this process are unintentional.

## 2022-01-31 ENCOUNTER — Inpatient Hospital Stay: Payer: Medicare Other

## 2022-01-31 ENCOUNTER — Encounter: Payer: Self-pay | Admitting: Anesthesiology

## 2022-01-31 DIAGNOSIS — D509 Iron deficiency anemia, unspecified: Secondary | ICD-10-CM | POA: Diagnosis not present

## 2022-01-31 DIAGNOSIS — I509 Heart failure, unspecified: Secondary | ICD-10-CM | POA: Diagnosis not present

## 2022-01-31 DIAGNOSIS — J9601 Acute respiratory failure with hypoxia: Secondary | ICD-10-CM | POA: Diagnosis not present

## 2022-01-31 DIAGNOSIS — F419 Anxiety disorder, unspecified: Secondary | ICD-10-CM | POA: Diagnosis not present

## 2022-01-31 DIAGNOSIS — J449 Chronic obstructive pulmonary disease, unspecified: Secondary | ICD-10-CM | POA: Diagnosis not present

## 2022-01-31 LAB — GLUCOSE, CAPILLARY
Glucose-Capillary: 165 mg/dL — ABNORMAL HIGH (ref 70–99)
Glucose-Capillary: 92 mg/dL (ref 70–99)
Glucose-Capillary: 179 mg/dL — ABNORMAL HIGH (ref 70–99)
Glucose-Capillary: 63 mg/dL — ABNORMAL LOW (ref 70–99)
Glucose-Capillary: 84 mg/dL (ref 70–99)

## 2022-01-31 LAB — BASIC METABOLIC PANEL
Anion gap: 8 (ref 5–15)
BUN: 22 mg/dL (ref 8–23)
CO2: 33 mmol/L — ABNORMAL HIGH (ref 22–32)
Calcium: 8.5 mg/dL — ABNORMAL LOW (ref 8.9–10.3)
Chloride: 102 mmol/L (ref 98–111)
Creatinine, Ser: 0.77 mg/dL (ref 0.44–1.00)
GFR, Estimated: >60 mL/min (ref >60)
Glucose, Bld: 105 mg/dL — ABNORMAL HIGH (ref 70–99)
Potassium: 3.4 mmol/L — ABNORMAL LOW (ref 3.5–5.1)
Sodium: 143 mmol/L (ref 135–145)

## 2022-01-31 LAB — MAGNESIUM: Magnesium: 1.7 mg/dL (ref 1.7–2.4)

## 2022-01-31 MED ORDER — POTASSIUM CHLORIDE CRYS ER 20 MEQ PO TBCR
40.0000 meq | EXTENDED_RELEASE_TABLET | Freq: Once | ORAL | Status: AC
  Administered 2022-01-31: 40 meq via ORAL
  Filled 2022-01-31: qty 2

## 2022-01-31 MED ORDER — POLYETHYLENE GLYCOL 3350 17 GM/SCOOP PO POWD
1.0000 | Freq: Once | ORAL | Status: AC
  Administered 2022-01-31: 255 g via ORAL
  Filled 2022-01-31: qty 255

## 2022-01-31 MED ORDER — SODIUM CHLORIDE 0.9 % IV SOLN: INTRAVENOUS | Status: DC

## 2022-01-31 NOTE — Progress Notes (Signed)
?Progress Note ? ?Patient: Jessica Stout BWI:[id number] DOB: 07/17/1951  ?DOA: 01/29/2022  DOS: 01/31/2022  ?  ?Brief hospital course: ?Jessica Stout is a 71 y.o. female with a history of T2DM, HTN, HLD, anxiety/depression who presented to the ED 3/1 with several weeks of dyspnea with exertion, worsening to dyspnea at rest associated with leg swelling and severe, progressive fatigue. She had been evaluated by cardiology recently, echocardiogram ordered and pending. GI referral was made due to significant anemia (hgb 9.3g/dl from previous 14g/dl), and an episode of pink stool. In the ED she was afebrile, newly hypoxemic, with sinus tachycardia. CXR with cardiomegaly, pulmonary edema, BNP recently >2000. IV lasix given with some improvement, GDMT initiated with cardiology consultation for LVEF 45-50%. With improved cardiorespiratory status, plan is to undergo colonoscopy, EGD +/- VCE 3/4.  ? ?Assessment and Plan: ?* New onset of HFrEF ?LVEF 45-50%, mild-mod MR, IVC normal. ?- Continue lasix 40mg  IV BID, good diuresis, stable renal function, improving clinical evidence of pulmonary edema.  ?- ARB, BB ordered for GDMT. ?- Seen at [facility], appreciate recommendations. ? ? ?Iron deficiency anemia ?Microcytic and trending down past few months, possible GI bleed vs. nutritional deficiency. Iron stores low, high TIBC, 5% sat. Bilirubin wnl. ?- IV iron given 3/3 ?- GI consulted, d/w Dr. Marius Ditch, will pursue evaluation for GI bleeding with EGD, colonoscopy 3/4. ?- With concern for GI bleed, use SCDs for VTE ppx for now. ? ?Acute respiratory failure with hypoxia ([place]) ?Due to new diagnosis CHF with pulmonary edema. Has baseline COPD, though no wheezing to suggest exacerbation, just lowers pulmonary reserve. ?- Continue diuresis. Still with crackles on exam, albeit improved. Will wean to room air as tolerated. If unable to do so after diuresis or RV appears overloaded on echo, would need to evaluate for PE. ? ?Anxiety and  depression ?Quiescent.  ?- Continue home effexor ? ?Hyperlipidemia ?Pt not taking rosuvastatin. No definitive PAD, CAD, though with DM, would qualify for statin regardless. LDL is 48. Will discuss with patient. ? ?Chronic obstructive pulmonary disease ([place]) ?No home controlled medications. No evidence of exacerbation.  ?- prn duoneb. ? ?Diabetes mellitus type 2, controlled, without complications ([place]) ?- HbA1c pending ?- Hold home sitaglipitin, metformin ?- Covering with novolog 3u TIDWC + mod SSI. May need to reinitiate home basal insulin.  ? ? ?Subjective: Feels her breathing is much better, still on oxygen though. No chest pain. Getting preprocedural clean out, no gross blood noted.  ? ?Objective: ?Vitals:  ? 01/31/22 0500 01/31/22 0904 01/31/22 1200 01/31/22 1618  ?BP:  134/77 125/68 118/64  ?Pulse:  89 78 76  ?Resp:  15 18 17   ?Temp:  97.6 ?F (36.4 ?C) 97.8 ?F (36.6 ?C) 98 ?F (36.7 ?C)  ?TempSrc:      ?SpO2:  92% 93% 96%  ?Weight: 71.9 kg     ?Height:      ? ?Gen: 71 y.o. female in no distress ?Pulm: Nonlabored breathing supplemental oxygen, improved crackles but still present at bases. ?CV: Regular rate and rhythm. No murmur, rub, or gallop. No JVD, no dependent edema. ?GI: Abdomen soft, non-tender, non-distended, with normoactive bowel sounds.  ?Ext: Warm, no deformities ?Skin: No new rashes, lesions or ulcers on visualized skin. ?Neuro: Alert and oriented. No focal neurological deficits. ?Psych: Judgement and insight appear fair. Mood euthymic & affect congruent. Behavior is appropriate.   ? ?Data Personally reviewed: ?Hgb 9.3 > 9.2, microcytic indices, ferritin 11, iron 27, TIBC 545, 5% sat, Na 146, SCr 0.75 >  0.93 > 0.77 ?LFTs wnl, TBili 0.3.  ?LDL 48, HDL 36.  ?Glucose remains at inpatient goal.  ? ?CXR (personal review): Cardiomegaly with vascular congestion centrally, suggestion of effusions.  ?   ?SARS-CoV-2 PCR (3/1): Negative ?Influenza A/B: Negative ? ?Family Communication: Spouse at  bedside ? ?Disposition: ?Status is: Inpatient ?Remains inpatient appropriate because: Ongoing IV diuresis, GI bleed work up ?Planned Discharge Destination: Home ? ?Patrecia Pour, MD ?01/31/2022 4:49 PM ?Page by [email or web address]  ?

## 2022-01-31 NOTE — Progress Notes (Signed)
[place] NOTE       Patient ID: Jessica Stout MRN: [id number] DOB/AGE: 1951/09/13 71 y.o.  Admit date: 01/29/2022 Referring Physician Dr. Vance Gather Primary Physician Dr. Hall Busing Primary Cardiologist Dr. Donnelly Angelica Reason for Consultation new onset CHF  HPI: The patient is a 71 year old female with a past medical history notable for type 2 diabetes, hypertension, sinus tachycardia with frequent PACs, asthma, colon polyps s/p colon resection (3 feet of colon resected 2002) who presented to [facility] ED 01/29/2022 with worsening shortness of breath. Echo 3/2 resulted with LVEF 45-50% and mildly decreased function, mi-mod MR.  Interval History:  -feels much better this morning, slept well last night and SOB has greatly improved.  -denies chest pain, palpitations.  -Echo resulted with LVEF 45-50% and mildly decreased function, mi-mod MR.  Review of systems complete and found to be negative unless listed above   Medications Prior to Admission  Medication Sig Dispense Refill Last Dose   acetaminophen (TYLENOL) 325 MG tablet Take 650 mg by mouth every 6 (six) hours as needed.   Past Week   albuterol (VENTOLIN HFA) 108 (90 Base) MCG/ACT inhaler Inhale into the lungs.   Past Week   atorvastatin (LIPITOR) 40 MG tablet Take 40 mg by mouth daily.   01/28/2022   fenofibrate 54 MG tablet Take 54 mg by mouth daily.   01/28/2022   furosemide (LASIX) 40 MG tablet Take 40 mg by mouth daily.   01/28/2022   gabapentin (NEURONTIN) 600 MG tablet Take by mouth.   01/28/2022   JANUMET 50-1000 MG tablet Take 1 tablet by mouth daily.   01/28/2022   LANTUS SOLOSTAR 100 UNIT/ML Solostar Pen Inject 10 Units into the skin at bedtime.   01/28/2022   losartan (COZAAR) 25 MG tablet Take 25 mg by mouth daily.   01/28/2022   losartan-hydrochlorothiazide (HYZAAR) 100-12.5 MG tablet Take 1 tablet by mouth daily.   01/28/2022   meloxicam (MOBIC) 7.5 MG tablet Take 7.5 mg by mouth daily. (Patient not taking:  Reported on 01/30/2022)   Not Taking   rosuvastatin (CRESTOR) 20 MG tablet Crestor 20 mg tablet (Patient not taking: Reported on 01/30/2022)   Not Taking   traMADol (ULTRAM) 50 MG tablet Take 50 mg by mouth 4 (four) times daily as needed.   01/28/2022   venlafaxine XR (EFFEXOR-XR) 75 MG 24 hr capsule Take 75 mg by mouth daily.   01/28/2022   Social History   Socioeconomic History   Marital status: Unknown    Spouse name: Not on file   Number of children: Not on file   Years of education: Not on file   Highest education level: Not on file  Occupational History   Not on file  Tobacco Use   Smoking status: Not on file   Smokeless tobacco: Not on file  Substance and Sexual Activity   Alcohol use: Not on file   Drug use: Not on file   Sexual activity: Not on file  Other Topics Concern   Not on file  Social History Narrative   Not on file   Social Determinants of Health   Financial Resource Strain: Not on file  Food Insecurity: Not on file  Transportation Needs: Not on file  Physical Activity: Not on file  Stress: Not on file  Social Connections: Not on file  Intimate Partner Violence: Not on file    No family history on file.    Review of systems complete and found to be negative  unless listed above    PHYSICAL EXAM General: Elderly Caucasian female, well nourished, in no acute distress.  Sitting upright in recliner.  HEENT:  Normocephalic and atraumatic. Neck:  No JVD.  Lungs: normal respiratory effort on by nasal cannula.  Poor air movement, some bibasilar crackles  heart: Tachycardic RRR . Normal S1 and S2 without gallops or murmurs. Radial & DP pulses 2+ bilaterally. Abdomen: Soft, obese appearing. Msk: Normal strength and tone for age. Extremities: Warm and well perfused. No clubbing, cyanosis.  Trace bilateral LEE, much improved.  Neuro: Alert and oriented X 3. Psych:  Answers questions appropriately.   Labs:   Lab Results  Component Value Date   WBC 9.4  01/30/2022   HGB 9.2 (L) 01/30/2022   HCT 32.4 (L) 01/30/2022   MCV 78.8 (L) 01/30/2022   PLT 271 01/30/2022    Recent Labs  Lab 01/29/22 1124 01/30/22 0524 01/31/22 0433  NA 144   < > 143  K 4.4   < > 3.4*  CL 105   < > 102  CO2 31   < > 33*  BUN 14   < > 22  CREATININE 0.75   < > 0.77  CALCIUM 9.1   < > 8.5*  PROT 6.3*  --   --   BILITOT 0.3  --   --   ALKPHOS 41  --   --   ALT 9  --   --   AST 14*  --   --   GLUCOSE 93   < > 105*   < > = values in this interval not displayed.    No results found for: CKTOTAL, CKMB, CKMBINDEX, TROPONINI  Lab Results  Component Value Date   CHOL 108 01/30/2022   Lab Results  Component Value Date   HDL 36 (L) 01/30/2022   Lab Results  Component Value Date   LDLCALC 48 01/30/2022   Lab Results  Component Value Date   TRIG 119 01/30/2022   Lab Results  Component Value Date   CHOLHDL 3.0 01/30/2022   No results found for: LDLDIRECT    Radiology: DG Chest 2 View  Result Date: 01/29/2022 CLINICAL DATA:  Shortness of breath. EXAM: CHEST - 2 VIEW COMPARISON:  None. FINDINGS: Mild cardiomegaly is noted. Mild central pulmonary vascular congestion is noted with probable bibasilar pulmonary edema and pleural effusions with associated atelectasis. Bony thorax is unremarkable. IMPRESSION: Mild cardiomegaly with central pulmonary vascular congestion and probable bilateral pulmonary edema and atelectasis and pleural effusions. Electronically Signed   By: Marijo Conception M.D.   On: 01/29/2022 11:58   ECHOCARDIOGRAM COMPLETE  Result Date: 01/30/2022    ECHOCARDIOGRAM REPORT   Patient Name:   Jessica Stout Date of Exam: 01/30/2022 Medical Rec #:  [id number]   Height:       64.0 in Accession #:    [id number]  Weight:       159.0 lb Date of Birth:  Mar 25, 1951   BSA:          1.775 m Patient Age:    71 years    BP:           141/72 mmHg Patient Gender: F           HR:           58 bpm. Exam Location:  [facility] Procedure: 2D Echo, Cardiac Doppler and Color  Doppler Indications:     CHF I50.9  History:  Patient has no prior history of Echocardiogram examinations. No                  past medical history on file.  Sonographer:     Sherrie Sport Referring Phys:  [id number] Emeterio Reeve Diagnosing Phys: Isaias Cowman MD  Sonographer Comments: Suboptimal apical window. IMPRESSIONS  1. Left ventricular ejection fraction, by estimation, is 45 to 50%. The left ventricle has mildly decreased function. The left ventricle has no regional wall motion abnormalities. There is mild left ventricular hypertrophy. Left ventricular diastolic parameters are indeterminate.  2. Right ventricular systolic function is normal. The right ventricular size is normal.  3. The mitral valve is normal in structure. Mild to moderate mitral valve regurgitation. No evidence of mitral stenosis.  4. The aortic valve is normal in structure. Aortic valve regurgitation is not visualized. No aortic stenosis is present.  5. The inferior vena cava is normal in size with greater than 50% respiratory variability, suggesting right atrial pressure of 3 mmHg. FINDINGS  Left Ventricle: Left ventricular ejection fraction, by estimation, is 45 to 50%. The left ventricle has mildly decreased function. The left ventricle has no regional wall motion abnormalities. The left ventricular internal cavity size was normal in size. There is mild left ventricular hypertrophy. Left ventricular diastolic parameters are indeterminate. Right Ventricle: The right ventricular size is normal. No increase in right ventricular wall thickness. Right ventricular systolic function is normal. Left Atrium: Left atrial size was normal in size. Right Atrium: Right atrial size was normal in size. Pericardium: There is no evidence of pericardial effusion. Mitral Valve: The mitral valve is normal in structure. Mild to moderate mitral valve regurgitation. No evidence of mitral valve stenosis. MV peak gradient, 6.7 mmHg. The mean mitral  valve gradient is 2.0 mmHg. Tricuspid Valve: The tricuspid valve is normal in structure. Tricuspid valve regurgitation is mild . No evidence of tricuspid stenosis. Aortic Valve: The aortic valve is normal in structure. Aortic valve regurgitation is not visualized. No aortic stenosis is present. Aortic valve mean gradient measures 1.0 mmHg. Aortic valve peak gradient measures 2.2 mmHg. Aortic valve area, by VTI measures 2.46 cm. Pulmonic Valve: The pulmonic valve was normal in structure. Pulmonic valve regurgitation is not visualized. No evidence of pulmonic stenosis. Aorta: The aortic root is normal in size and structure. Venous: The inferior vena cava is normal in size with greater than 50% respiratory variability, suggesting right atrial pressure of 3 mmHg. IAS/Shunts: No atrial level shunt detected by color flow Doppler.  LEFT VENTRICLE PLAX 2D LVIDd:         5.10 cm   Diastology LVIDs:         3.90 cm   LV e' medial:    3.26 cm/s LV PW:         1.40 cm   LV E/e' medial:  19.4 LV IVS:        1.20 cm   LV e' lateral:   4.68 cm/s LVOT diam:     2.00 cm   LV E/e' lateral: 13.5 LV SV:         28 LV SV Index:   16 LVOT Area:     3.14 cm  LEFT ATRIUM              Index        RIGHT ATRIUM           Index LA diam:        4.60 cm  2.59  cm/m   RA Area:     22.60 cm LA Vol (A2C):   87.2 ml  49.14 ml/m  RA Volume:   75.90 ml  42.77 ml/m LA Vol (A4C):   111.0 ml 62.55 ml/m LA Biplane Vol: 99.9 ml  56.29 ml/m  AORTIC VALVE                    PULMONIC VALVE AV Area (Vmax):    2.17 cm     PV Vmax:        0.85 m/s AV Area (Vmean):   2.06 cm     PV Vmean:       55.700 cm/s AV Area (VTI):     2.46 cm     PV VTI:         0.107 m AV Vmax:           74.60 cm/s   PV Peak grad:   2.9 mmHg AV Vmean:          52.350 cm/s  PV Mean grad:   1.0 mmHg AV VTI:            0.114 m      RVOT Peak grad: 1 mmHg AV Peak Grad:      2.2 mmHg AV Mean Grad:      1.0 mmHg LVOT Vmax:         51.60 cm/s LVOT Vmean:        34.300 cm/s LVOT VTI:           0.089 m LVOT/AV VTI ratio: 0.78  AORTA Ao Root diam: 3.05 cm MITRAL VALVE MV Area (PHT): 6.43 cm     SHUNTS MV Area VTI:   1.45 cm     Systemic VTI:  0.09 m MV Peak grad:  6.7 mmHg     Systemic Diam: 2.00 cm MV Mean grad:  2.0 mmHg     Pulmonic VTI:  0.067 m MV Vmax:       1.29 m/s MV Vmean:      65.2 cm/s MV Decel Time: 118 msec MV E velocity: 63.40 cm/s MV A velocity: 112.00 cm/s MV E/A ratio:  0.57 Isaias Cowman MD Electronically signed by Isaias Cowman MD Signature Date/Time: 01/30/2022/4:37:47 PM    Final     ECHO LVEF 45-50% mi-mod MR   TELEMETRY reviewed by me: Sinus rhythm with periods of tachycardia at 115  EKG reviewed by me: sinus tach with PACs rate 119  ASSESSMENT AND PLAN:  The patient is a 71 year old female with a past medical history notable for type 2 diabetes, hypertension, sinus tachycardia with frequent PACs, asthma, colon polyps s/p colon resection (3 feet of colon resected 2002) who presented to [facility] ED 01/29/2022 with worsening shortness of breath.  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 She doubled her dose of lasix for 7 days without significant improvement.  Her BNP is markedly elevated to 2000, chest x-ray shows pleural effusions and central pulmonary vascular congestion and she is clinically grossly volume overloaded.  She feels like her swelling is much improved after IV diuresis thus far, and breathing has much improved on 01/31/22 -S/p 20mg  IV lasix x 1 and 40 mg IV Lasix x 3 doses with great UOP. -Continue 40 mg IV Lasix twice daily for now with close monitoring of renal function and  electrolytes. Will likely dc on 40mg  PO lasix once daily. -Continue losartan 25 mg once daily and metoprolol XL 25mg  once daily for GDMT.  -Recommend strict I's and O's, daily weights -wean O2 as tolerated - from  3L down to 2L this morning. (Not on O2 at home)  -discussed fluid restriction to 1.5L per day and low salt diet - appreciate HF RN education. -encouraged ambulation and OOB as tolerated.  -as the patient is compensating much better and diuresing well, she should be ok to undergo upper endoscopy/colonoscopy +/- video capsule endoscopy with GI tomorrow from a cardiac standpoint.  #IDA Hemoglobin measured 2/23 was 8.8, improved slightly to 9.2 today however patient still has significant shortness of breath likely 2/2 pulmonary edema and ongoing anemia -appreciate GI recommendations  #hypokalemia #hypomagnesemia K+ decreasing with aggressive diuresis, 3.4 today - replete Mg on admission 1.5, replaced yesterday and recheck labs today  #hypertension #hyperlipidemia -continue atorvastatin 40mg  and medications as above.   This patient's plan of care was discussed and created with Dr. Saralyn Pilar and he is in agreement.  Signed: Tristan Schroeder , PA-C 01/31/2022, 9:19 AM [facility]

## 2022-01-31 NOTE — Progress Notes (Signed)
Jessica Darby, MD [address]  [place]  [place], [place] [address]  Main: [phone]  Fax: [phone] Pager: [phone]   Subjective: Patient reports feeling significantly better, she was able to sleep through night.  Her shortness of breath has almost resolved.  Her swelling of legs has resolved.  She is having her breakfast.  She wants to get upper endoscopy and colonoscopy done as inpatient.   Objective: Vital signs in last 24 hours: Vitals:   01/31/22 0003 01/31/22 0350 01/31/22 0500 01/31/22 0904  BP: 106/60 (!) 113/57  134/77  Pulse: 77 73  89  Resp: 19 18  15   Temp: 98.8 F (37.1 C) 98.3 F (36.8 C)  97.6 F (36.4 C)  TempSrc:      SpO2: 94% 94%  92%  Weight:   71.9 kg   Height:       Weight change: -0.222 kg  Intake/Output Summary (Last 24 hours) at 01/31/2022 0912 Last data filed at 01/30/2022 1526 Gross per 24 hour  Intake 240 ml  Output 1380 ml  Net -1140 ml     Exam: Heart:: Regular rate and rhythm, S1S2 present, or without murmur or extra heart sounds Lungs: normal and clear to auscultation Abdomen: soft, nontender, normal bowel sounds   Lab Results: CBC Latest Ref Rng & Units 01/30/2022 01/29/2022  WBC 4.0 - 10.5 K/uL 9.4 9.1  Hemoglobin 12.0 - 15.0 g/dL 9.2(L) 9.3(L)  Hematocrit 36.0 - 46.0 % 32.4(L) 32.7(L)  Platelets 150 - 400 K/uL 271 279    Micro Results: Recent Results (from the past 240 hour(s))  Resp Panel by RT-PCR (Flu A&B, Covid) Nasopharyngeal Swab     Status: None   Collection Time: 01/29/22  7:42 PM   Specimen: Nasopharyngeal Swab; Nasopharyngeal(NP) swabs in vial transport medium  Result Value Ref Range Status   SARS Coronavirus 2 by RT PCR NEGATIVE NEGATIVE Final    Comment: (NOTE) SARS-CoV-2 target nucleic acids are NOT DETECTED.  The SARS-CoV-2 RNA is generally detectable in upper respiratory specimens during the acute phase of infection. The lowest concentration of SARS-CoV-2 viral copies this assay can  detect is 138 copies/mL. A negative result does not preclude SARS-Cov-2 infection and should not be used as the sole basis for treatment or other patient management decisions. A negative result may occur with  improper specimen collection/handling, submission of specimen other than nasopharyngeal swab, presence of viral mutation(s) within the areas targeted by this assay, and inadequate number of viral copies(<138 copies/mL). A negative result must be combined with clinical observations, patient history, and epidemiological information. The expected result is Negative.  Fact Sheet for Patients:  EntrepreneurPulse.com.au  Fact Sheet for Healthcare Providers:  IncredibleEmployment.be  This test is no t yet approved or cleared by the [place] FDA and  has been authorized for detection and/or diagnosis of SARS-CoV-2 by FDA under an Emergency Use Authorization (EUA). This EUA will remain  in effect (meaning this test can be used) for the duration of the COVID-19 declaration under Section 564(b)(1) of the Act, 21 U.S.C.section 360bbb-3(b)(1), unless the authorization is terminated  or revoked sooner.       Influenza A by PCR NEGATIVE NEGATIVE Final   Influenza B by PCR NEGATIVE NEGATIVE Final    Comment: (NOTE) The Xpert Xpress SARS-CoV-2/FLU/RSV plus assay is intended as an aid in the diagnosis of influenza from Nasopharyngeal swab specimens and should not be used as a sole basis for treatment. Nasal washings and aspirates are  unacceptable for Xpert Xpress SARS-CoV-2/FLU/RSV testing.  Fact Sheet for Patients: EntrepreneurPulse.com.au  Fact Sheet for Healthcare Providers: IncredibleEmployment.be  This test is not yet approved or cleared by the [place] FDA and has been authorized for detection and/or diagnosis of SARS-CoV-2 by FDA under an Emergency Use Authorization (EUA). This EUA will remain in  effect (meaning this test can be used) for the duration of the COVID-19 declaration under Section 564(b)(1) of the Act, 21 U.S.C. section 360bbb-3(b)(1), unless the authorization is terminated or revoked.  Performed at [facility], [address], [place], [place] [address]    Studies/Results: DG Chest 2 View  Result Date: 01/29/2022 CLINICAL DATA:  Shortness of breath. EXAM: CHEST - 2 VIEW COMPARISON:  None. FINDINGS: Mild cardiomegaly is noted. Mild central pulmonary vascular congestion is noted with probable bibasilar pulmonary edema and pleural effusions with associated atelectasis. Bony thorax is unremarkable. IMPRESSION: Mild cardiomegaly with central pulmonary vascular congestion and probable bilateral pulmonary edema and atelectasis and pleural effusions. Electronically Signed   By: Marijo Conception M.D.   On: 01/29/2022 11:58   ECHOCARDIOGRAM COMPLETE  Result Date: 01/30/2022    ECHOCARDIOGRAM REPORT   Patient Name:   Jessica Stout Date of Exam: 01/30/2022 Medical Rec #:  [id number]   Height:       64.0 in Accession #:    [id number]  Weight:       159.0 lb Date of Birth:  01-Feb-1951   BSA:          1.775 m Patient Age:    71 years    BP:           141/72 mmHg Patient Gender: F           HR:           58 bpm. Exam Location:  [facility] Procedure: 2D Echo, Cardiac Doppler and Color Doppler Indications:     CHF I50.9  History:         Patient has no prior history of Echocardiogram examinations. No                  past medical history on file.  Sonographer:     Sherrie Sport Referring Phys:  [id number] Emeterio Reeve Diagnosing Phys: Isaias Cowman MD  Sonographer Comments: Suboptimal apical window. IMPRESSIONS  1. Left ventricular ejection fraction, by estimation, is 45 to 50%. The left ventricle has mildly decreased function. The left ventricle has no regional wall motion abnormalities. There is mild left ventricular hypertrophy. Left ventricular diastolic parameters are indeterminate.  2.  Right ventricular systolic function is normal. The right ventricular size is normal.  3. The mitral valve is normal in structure. Mild to moderate mitral valve regurgitation. No evidence of mitral stenosis.  4. The aortic valve is normal in structure. Aortic valve regurgitation is not visualized. No aortic stenosis is present.  5. The inferior vena cava is normal in size with greater than 50% respiratory variability, suggesting right atrial pressure of 3 mmHg. FINDINGS  Left Ventricle: Left ventricular ejection fraction, by estimation, is 45 to 50%. The left ventricle has mildly decreased function. The left ventricle has no regional wall motion abnormalities. The left ventricular internal cavity size was normal in size. There is mild left ventricular hypertrophy. Left ventricular diastolic parameters are indeterminate. Right Ventricle: The right ventricular size is normal. No increase in right ventricular wall thickness. Right ventricular systolic function is normal. Left Atrium: Left atrial size was normal in size. Right Atrium:  Right atrial size was normal in size. Pericardium: There is no evidence of pericardial effusion. Mitral Valve: The mitral valve is normal in structure. Mild to moderate mitral valve regurgitation. No evidence of mitral valve stenosis. MV peak gradient, 6.7 mmHg. The mean mitral valve gradient is 2.0 mmHg. Tricuspid Valve: The tricuspid valve is normal in structure. Tricuspid valve regurgitation is mild . No evidence of tricuspid stenosis. Aortic Valve: The aortic valve is normal in structure. Aortic valve regurgitation is not visualized. No aortic stenosis is present. Aortic valve mean gradient measures 1.0 mmHg. Aortic valve peak gradient measures 2.2 mmHg. Aortic valve area, by VTI measures 2.46 cm. Pulmonic Valve: The pulmonic valve was normal in structure. Pulmonic valve regurgitation is not visualized. No evidence of pulmonic stenosis. Aorta: The aortic root is normal in size and  structure. Venous: The inferior vena cava is normal in size with greater than 50% respiratory variability, suggesting right atrial pressure of 3 mmHg. IAS/Shunts: No atrial level shunt detected by color flow Doppler.  LEFT VENTRICLE PLAX 2D LVIDd:         5.10 cm   Diastology LVIDs:         3.90 cm   LV e' medial:    3.26 cm/s LV PW:         1.40 cm   LV E/e' medial:  19.4 LV IVS:        1.20 cm   LV e' lateral:   4.68 cm/s LVOT diam:     2.00 cm   LV E/e' lateral: 13.5 LV SV:         28 LV SV Index:   16 LVOT Area:     3.14 cm  LEFT ATRIUM              Index        RIGHT ATRIUM           Index LA diam:        4.60 cm  2.59 cm/m   RA Area:     22.60 cm LA Vol (A2C):   87.2 ml  49.14 ml/m  RA Volume:   75.90 ml  42.77 ml/m LA Vol (A4C):   111.0 ml 62.55 ml/m LA Biplane Vol: 99.9 ml  56.29 ml/m  AORTIC VALVE                    PULMONIC VALVE AV Area (Vmax):    2.17 cm     PV Vmax:        0.85 m/s AV Area (Vmean):   2.06 cm     PV Vmean:       55.700 cm/s AV Area (VTI):     2.46 cm     PV VTI:         0.107 m AV Vmax:           74.60 cm/s   PV Peak grad:   2.9 mmHg AV Vmean:          52.350 cm/s  PV Mean grad:   1.0 mmHg AV VTI:            0.114 m      RVOT Peak grad: 1 mmHg AV Peak Grad:      2.2 mmHg AV Mean Grad:      1.0 mmHg LVOT Vmax:         51.60 cm/s LVOT Vmean:        34.300 cm/s LVOT VTI:  0.089 m LVOT/AV VTI ratio: 0.78  AORTA Ao Root diam: 3.05 cm MITRAL VALVE MV Area (PHT): 6.43 cm     SHUNTS MV Area VTI:   1.45 cm     Systemic VTI:  0.09 m MV Peak grad:  6.7 mmHg     Systemic Diam: 2.00 cm MV Mean grad:  2.0 mmHg     Pulmonic VTI:  0.067 m MV Vmax:       1.29 m/s MV Vmean:      65.2 cm/s MV Decel Time: 118 msec MV E velocity: 63.40 cm/s MV A velocity: 112.00 cm/s MV E/A ratio:  0.57 Isaias Cowman MD Electronically signed by Isaias Cowman MD Signature Date/Time: 01/30/2022/4:37:47 PM    Final    Medications: I have reviewed the patient's current medications. Prior to  Admission:  Medications Prior to Admission  Medication Sig Dispense Refill Last Dose   acetaminophen (TYLENOL) 325 MG tablet Take 650 mg by mouth every 6 (six) hours as needed.   Past Week   albuterol (VENTOLIN HFA) 108 (90 Base) MCG/ACT inhaler Inhale into the lungs.   Past Week   atorvastatin (LIPITOR) 40 MG tablet Take 40 mg by mouth daily.   01/28/2022   fenofibrate 54 MG tablet Take 54 mg by mouth daily.   01/28/2022   furosemide (LASIX) 40 MG tablet Take 40 mg by mouth daily.   01/28/2022   gabapentin (NEURONTIN) 600 MG tablet Take by mouth.   01/28/2022   JANUMET 50-1000 MG tablet Take 1 tablet by mouth daily.   01/28/2022   LANTUS SOLOSTAR 100 UNIT/ML Solostar Pen Inject 10 Units into the skin at bedtime.   01/28/2022   losartan (COZAAR) 25 MG tablet Take 25 mg by mouth daily.   01/28/2022   losartan-hydrochlorothiazide (HYZAAR) 100-12.5 MG tablet Take 1 tablet by mouth daily.   01/28/2022   meloxicam (MOBIC) 7.5 MG tablet Take 7.5 mg by mouth daily. (Patient not taking: Reported on 01/30/2022)   Not Taking   rosuvastatin (CRESTOR) 20 MG tablet Crestor 20 mg tablet (Patient not taking: Reported on 01/30/2022)   Not Taking   traMADol (ULTRAM) 50 MG tablet Take 50 mg by mouth 4 (four) times daily as needed.   01/28/2022   venlafaxine XR (EFFEXOR-XR) 75 MG 24 hr capsule Take 75 mg by mouth daily.   01/28/2022   Scheduled:  furosemide  40 mg Intravenous BID   insulin aspart  0-15 Units Subcutaneous TID WC   insulin aspart  0-5 Units Subcutaneous QHS   insulin aspart  3 Units Subcutaneous TID WC   losartan  25 mg Oral Daily   magnesium oxide  400 mg Oral BID   metoprolol succinate  25 mg Oral Daily   polyethylene glycol powder  1 Container Oral Once   potassium chloride  40 mEq Oral Once   sodium chloride flush  3 mL Intravenous Q12H   venlafaxine XR  75 mg Oral Q breakfast   Continuous:  sodium chloride     sodium chloride     iron sucrose     ZOX:WRUEAV chloride, acetaminophen,  ipratropium-albuterol, ondansetron (ZOFRAN) IV, sodium chloride flush Anti-infectives (From admission, onward)    None      Scheduled Meds:  furosemide  40 mg Intravenous BID   insulin aspart  0-15 Units Subcutaneous TID WC   insulin aspart  0-5 Units Subcutaneous QHS   insulin aspart  3 Units Subcutaneous TID WC   losartan  25 mg Oral Daily  magnesium oxide  400 mg Oral BID   metoprolol succinate  25 mg Oral Daily   polyethylene glycol powder  1 Container Oral Once   potassium chloride  40 mEq Oral Once   sodium chloride flush  3 mL Intravenous Q12H   venlafaxine XR  75 mg Oral Q breakfast   Continuous Infusions:  sodium chloride     sodium chloride     iron sucrose     PRN Meds:.sodium chloride, acetaminophen, ipratropium-albuterol, ondansetron (ZOFRAN) IV, sodium chloride flush   Assessment: Principal Problem:   Congestive heart failure (CHF) (HCC) Active Problems:   Diabetes mellitus type 2, controlled, without complications (HCC)   Chronic obstructive pulmonary disease (HCC)   Hyperlipidemia   Anxiety and depression   Acute respiratory failure with hypoxia (HCC)   Iron deficiency anemia   Plan: Iron deficiency anemia: No evidence of active GI bleed Echocardiogram revealed EF of 45 to 50% with no regional wall motion abnormalities, mildly decreased LV function Patient has been aggressively diuresed and her volume status has significantly improved, swelling of legs has resolved and shortness of breath has significantly improved as well.  She is diuresing well on Lasix 40 mg IV twice daily We can proceed with upper endoscopy and colonoscopy tomorrow +/- video capsule endoscopy Clear liquid diet today N.p.o. effective 5 AM tomorrow Bowel prep today Dr. Virgina Jock will cover for the weekend Recommend IV iron   LOS: 2 days   Jorden Minchey 01/31/2022, 9:12 AM

## 2022-01-31 NOTE — Care Management Important Message (Signed)
Important Message ? ?Patient Details  ?Name: Jessica Stout ?MRN: [id number] ?Date of Birth: 02-02-1951 ? ? ?Medicare Important Message Given:  N/A - LOS <3 / Initial given by admissions ? ? ? ? ?Dannette Barbara ?01/31/2022, 10:06 AM ?

## 2022-02-01 ENCOUNTER — Encounter: Payer: Self-pay | Admitting: Osteopathic Medicine

## 2022-02-01 ENCOUNTER — Encounter
Admission: EM | Disposition: A | Discharge status: Home / Self Care | Payer: Self-pay | Source: Home / Self Care | Attending: Family Medicine

## 2022-02-01 DIAGNOSIS — J449 Chronic obstructive pulmonary disease, unspecified: Secondary | ICD-10-CM | POA: Diagnosis not present

## 2022-02-01 DIAGNOSIS — J9601 Acute respiratory failure with hypoxia: Secondary | ICD-10-CM | POA: Diagnosis not present

## 2022-02-01 DIAGNOSIS — E876 Hypokalemia: Secondary | ICD-10-CM

## 2022-02-01 DIAGNOSIS — I509 Heart failure, unspecified: Secondary | ICD-10-CM | POA: Diagnosis not present

## 2022-02-01 DIAGNOSIS — F419 Anxiety disorder, unspecified: Secondary | ICD-10-CM | POA: Diagnosis not present

## 2022-02-01 LAB — BASIC METABOLIC PANEL
Anion gap: 9 (ref 5–15)
BUN: 20 mg/dL (ref 8–23)
CO2: 32 mmol/L (ref 22–32)
Calcium: 8.5 mg/dL — ABNORMAL LOW (ref 8.9–10.3)
Chloride: 103 mmol/L (ref 98–111)
Creatinine, Ser: 0.48 mg/dL (ref 0.44–1.00)
GFR, Estimated: >60 mL/min (ref >60)
Glucose, Bld: 125 mg/dL — ABNORMAL HIGH (ref 70–99)
Potassium: 3.3 mmol/L — ABNORMAL LOW (ref 3.5–5.1)
Sodium: 144 mmol/L (ref 135–145)

## 2022-02-01 LAB — CBC
HCT: 31.6 % — ABNORMAL LOW (ref 36.0–46.0)
Hemoglobin: 9 g/dL — ABNORMAL LOW (ref 12.0–15.0)
MCH: 22 pg — ABNORMAL LOW (ref 26.0–34.0)
MCHC: 28.5 g/dL — ABNORMAL LOW (ref 30.0–36.0)
MCV: 77.3 fL — ABNORMAL LOW (ref 80.0–100.0)
Platelets: 271 10*3/uL (ref 150–400)
RBC: 4.09 MIL/uL (ref 3.87–5.11)
RDW: 16.5 % — ABNORMAL HIGH (ref 11.5–15.5)
WBC: 6.9 10*3/uL (ref 4.0–10.5)
nRBC: 0 % (ref 0.0–0.2)

## 2022-02-01 LAB — GLUCOSE, CAPILLARY
Glucose-Capillary: 110 mg/dL — ABNORMAL HIGH (ref 70–99)
Glucose-Capillary: 133 mg/dL — ABNORMAL HIGH (ref 70–99)
Glucose-Capillary: 107 mg/dL — ABNORMAL HIGH (ref 70–99)
Glucose-Capillary: 103 mg/dL — ABNORMAL HIGH (ref 70–99)

## 2022-02-01 SURGERY — ESOPHAGOGASTRODUODENOSCOPY (EGD) WITH PROPOFOL
Anesthesia: General

## 2022-02-01 MED ORDER — FUROSEMIDE 10 MG/ML IJ SOLN
40.0000 mg | Freq: Two times a day (BID) | INTRAMUSCULAR | Status: AC
  Administered 2022-02-01 – 2022-02-02 (×2): 40 mg via INTRAVENOUS
  Filled 2022-02-01 (×2): qty 4

## 2022-02-01 MED ORDER — FUROSEMIDE 10 MG/ML IJ SOLN
40.0000 mg | Freq: Every day | INTRAMUSCULAR | Status: DC
  Administered 2022-02-01: 40 mg via INTRAVENOUS
  Filled 2022-02-01: qty 4

## 2022-02-01 MED ORDER — POTASSIUM CHLORIDE CRYS ER 20 MEQ PO TBCR
40.0000 meq | EXTENDED_RELEASE_TABLET | Freq: Once | ORAL | Status: AC
  Administered 2022-02-01: 40 meq via ORAL
  Filled 2022-02-01: qty 2

## 2022-02-01 NOTE — Progress Notes (Signed)
Cross Cover ?SCD's ordered for VTE prophylaxis ? ?

## 2022-02-01 NOTE — Progress Notes (Signed)
Inpatient Follow-up/Progress Note   Patient ID: Jessica Stout is a 71 y.o. female.  Overnight Events / Subjective Findings NAEON. Completed bowel prep. Initially anticipated EGD and colonoscopy today. No signs of GIB. Pt feels her SOB has improved. Still on 2LNC of O2. D/w anesthesia who feels it is better to hold off until O2 requirement has resolved given new dx of HF. Denies chest pain and abdominal pain. No N/v.  Review of Systems  Constitutional:  Negative for activity change, appetite change, chills, diaphoresis, fatigue, fever and unexpected weight change.  HENT:  Negative for trouble swallowing and voice change.   Respiratory:  Negative for shortness of breath and wheezing.   Cardiovascular:  Negative for chest pain, palpitations and leg swelling.  Gastrointestinal:  Negative for abdominal distention, abdominal pain, anal bleeding, blood in stool, constipation, diarrhea, nausea, rectal pain and vomiting.  Musculoskeletal:  Negative for arthralgias and myalgias.  Skin:  Negative for color change and pallor.  Neurological:  Negative for dizziness, syncope and weakness.  Psychiatric/Behavioral:  Negative for confusion.   All other systems reviewed and are negative.   Medications  Current Facility-Administered Medications:    [MAR Hold] 0.9 %  sodium chloride infusion, 250 mL, Intravenous, PRN, Emeterio Reeve, DO   [facility] Hold] acetaminophen (TYLENOL) tablet 650 mg, 650 mg, Oral, Q4H PRN, Emeterio Reeve, DO, 650 mg at 01/31/22 2015   [MAR Hold] furosemide (LASIX) injection 40 mg, 40 mg, Intravenous, Daily, Patrecia Pour, MD   [facility] Hold] insulin aspart (novoLOG) injection 0-15 Units, 0-15 Units, Subcutaneous, TID WC, Emeterio Reeve, DO, 3 Units at 01/31/22 1712   [MAR Hold] insulin aspart (novoLOG) injection 0-5 Units, 0-5 Units, Subcutaneous, QHS, Alexander, Lanelle Bal, DO   [facility] Hold] insulin aspart (novoLOG) injection 3 Units, 3 Units, Subcutaneous, TID WC, Emeterio Reeve, DO, 3 Units at 01/31/22 1712   [MAR Hold] ipratropium-albuterol (DUONEB) 0.5-2.5 (3) MG/3ML nebulizer solution 3 mL, 3 mL, Nebulization, Q4H PRN, Emeterio Reeve, DO   [facility] Hold] losartan (COZAAR) tablet 25 mg, 25 mg, Oral, Daily, Emeterio Reeve, DO, 25 mg at 01/31/22 1012   [MAR Hold] magnesium oxide (MAG-OX) tablet 400 mg, 400 mg, Oral, BID, Tang, Alanson Puls, PA-C, 400 mg at 01/31/22 2015   [facility] Hold] metoprolol succinate (TOPROL-XL) 24 hr tablet 25 mg, 25 mg, Oral, Daily, Tang, Lily Michelle, PA-C, 25 mg at 01/31/22 1013   [MAR Hold] ondansetron (ZOFRAN) injection 4 mg, 4 mg, Intravenous, Q6H PRN, Emeterio Reeve, DO   [facility] Hold] potassium chloride SA (KLOR-CON M) CR tablet 40 mEq, 40 mEq, Oral, Once, Patrecia Pour, MD   [facility] Hold] sodium chloride flush (NS) 0.9 % injection 3 mL, 3 mL, Intravenous, Q12H, Emeterio Reeve, DO, 3 mL at 01/31/22 2016   [facility] Hold] sodium chloride flush (NS) 0.9 % injection 3 mL, 3 mL, Intravenous, PRN, Emeterio Reeve, DO   [facility] Hold] venlafaxine XR (EFFEXOR-XR) 24 hr capsule 75 mg, 75 mg, Oral, Q breakfast, Emeterio Reeve, DO, 75 mg at 01/31/22 1013  [MAR Hold] sodium chloride      [MAR Hold] sodium chloride, [MAR Hold] acetaminophen, [MAR Hold] ipratropium-albuterol, [MAR Hold] ondansetron (ZOFRAN) IV, [MAR Hold] sodium chloride flush   Objective    Vitals:   01/31/22 1947 01/31/22 2328 02/01/22 0409 02/01/22 0500  BP: 135/71 (!) 108/57 113/62   Pulse: 76 77 82   Resp: (!) 24 18 16    Temp: 97.6 F (36.4 C) 97.8 F (36.6 C) 97.8 F (36.6 C)   TempSrc:  SpO2: 93% 91% 91%   Weight:    66.8 kg  Height:         Physical Exam Vitals and nursing note reviewed.  Constitutional:      General: She is not in acute distress.    Appearance: Normal appearance. She is not ill-appearing, toxic-appearing or diaphoretic.  HENT:     Head: Normocephalic and atraumatic.     Nose: Nose normal.     Mouth/Throat:     Mouth:  Mucous membranes are moist.     Pharynx: Oropharynx is clear.  Eyes:     General: No scleral icterus.    Extraocular Movements: Extraocular movements intact.  Cardiovascular:     Rate and Rhythm: Normal rate and regular rhythm.     Heart sounds: Normal heart sounds. No murmur heard.   No friction rub. No gallop.  Pulmonary:     Effort: Pulmonary effort is normal. No respiratory distress.     Breath sounds: Normal breath sounds. No wheezing, rhonchi or rales.     Comments: 2LNC Abdominal:     General: Abdomen is flat. Bowel sounds are normal. There is no distension.     Palpations: Abdomen is soft.     Tenderness: There is no abdominal tenderness. There is no guarding or rebound.  Musculoskeletal:     Cervical back: Neck supple.     Right lower leg: No edema.     Left lower leg: No edema.  Skin:    General: Skin is warm and dry.     Coloration: Skin is not jaundiced or pale.  Neurological:     General: No focal deficit present.     Mental Status: She is alert and oriented to person, place, and time. Mental status is at baseline.  Psychiatric:        Mood and Affect: Mood normal.        Behavior: Behavior normal.        Thought Content: Thought content normal.        Judgment: Judgment normal.     Laboratory Data Recent Labs  Lab 01/29/22 1124 01/30/22 0524 02/01/22 0526  WBC 9.1 9.4 6.9  HGB 9.3* 9.2* 9.0*  HCT 32.7* 32.4* 31.6*  PLT 279 271 271   Recent Labs  Lab 01/29/22 1124 01/30/22 0524 01/31/22 0433 02/01/22 0526  NA 144 146* 143 144  K 4.4 4.6 3.4* 3.3*  CL 105 104 102 103  CO2 31 34* 33* 32  BUN 14 19 22 20   CREATININE 0.75 0.93 0.77 0.48  CALCIUM 9.1 9.0 8.5* 8.5*  PROT 6.3*  --   --   --   BILITOT 0.3  --   --   --   ALKPHOS 41  --   --   --   ALT 9  --   --   --   AST 14*  --   --   --   GLUCOSE 93 114* 105* 125*   No results for input(s): INR in the last 168 hours.    Imaging Studies: DG Chest 1 View  Result Date: 01/31/2022 CLINICAL  DATA:  Shortness of breath. EXAM: CHEST  1 VIEW COMPARISON:  Radiographs 01/29/2022. FINDINGS: 0828 hours. Stable cardiac enlargement with small bilateral pleural effusions and bibasilar atelectasis. There is vascular congestion with possible mild edema. No evidence of pneumothorax. The bones appear unremarkable. Telemetry leads overlie the chest. IMPRESSION: Stable examination with cardiomegaly, bilateral pleural effusions and possible mild edema. Electronically Signed  By: Richardean Sale M.D.   On: 01/31/2022 09:47   ECHOCARDIOGRAM COMPLETE  Result Date: 01/30/2022    ECHOCARDIOGRAM REPORT   Patient Name:   LUAN URBANI Date of Exam: 01/30/2022 Medical Rec #:  [id number]   Height:       64.0 in Accession #:    [id number]  Weight:       159.0 lb Date of Birth:  December 23, 1950   BSA:          1.775 m Patient Age:    54 years    BP:           141/72 mmHg Patient Gender: F           HR:           58 bpm. Exam Location:  [facility] Procedure: 2D Echo, Cardiac Doppler and Color Doppler Indications:     CHF I50.9  History:         Patient has no prior history of Echocardiogram examinations. No                  past medical history on file.  Sonographer:     Sherrie Sport Referring Phys:  [id number] Emeterio Reeve Diagnosing Phys: Isaias Cowman MD  Sonographer Comments: Suboptimal apical window. IMPRESSIONS  1. Left ventricular ejection fraction, by estimation, is 45 to 50%. The left ventricle has mildly decreased function. The left ventricle has no regional wall motion abnormalities. There is mild left ventricular hypertrophy. Left ventricular diastolic parameters are indeterminate.  2. Right ventricular systolic function is normal. The right ventricular size is normal.  3. The mitral valve is normal in structure. Mild to moderate mitral valve regurgitation. No evidence of mitral stenosis.  4. The aortic valve is normal in structure. Aortic valve regurgitation is not visualized. No aortic stenosis is present.  5. The inferior  vena cava is normal in size with greater than 50% respiratory variability, suggesting right atrial pressure of 3 mmHg. FINDINGS  Left Ventricle: Left ventricular ejection fraction, by estimation, is 45 to 50%. The left ventricle has mildly decreased function. The left ventricle has no regional wall motion abnormalities. The left ventricular internal cavity size was normal in size. There is mild left ventricular hypertrophy. Left ventricular diastolic parameters are indeterminate. Right Ventricle: The right ventricular size is normal. No increase in right ventricular wall thickness. Right ventricular systolic function is normal. Left Atrium: Left atrial size was normal in size. Right Atrium: Right atrial size was normal in size. Pericardium: There is no evidence of pericardial effusion. Mitral Valve: The mitral valve is normal in structure. Mild to moderate mitral valve regurgitation. No evidence of mitral valve stenosis. MV peak gradient, 6.7 mmHg. The mean mitral valve gradient is 2.0 mmHg. Tricuspid Valve: The tricuspid valve is normal in structure. Tricuspid valve regurgitation is mild . No evidence of tricuspid stenosis. Aortic Valve: The aortic valve is normal in structure. Aortic valve regurgitation is not visualized. No aortic stenosis is present. Aortic valve mean gradient measures 1.0 mmHg. Aortic valve peak gradient measures 2.2 mmHg. Aortic valve area, by VTI measures 2.46 cm. Pulmonic Valve: The pulmonic valve was normal in structure. Pulmonic valve regurgitation is not visualized. No evidence of pulmonic stenosis. Aorta: The aortic root is normal in size and structure. Venous: The inferior vena cava is normal in size with greater than 50% respiratory variability, suggesting right atrial pressure of 3 mmHg. IAS/Shunts: No atrial level shunt detected by color flow Doppler.  LEFT VENTRICLE PLAX  2D LVIDd:         5.10 cm   Diastology LVIDs:         3.90 cm   LV e' medial:    3.26 cm/s LV PW:         1.40  cm   LV E/e' medial:  19.4 LV IVS:        1.20 cm   LV e' lateral:   4.68 cm/s LVOT diam:     2.00 cm   LV E/e' lateral: 13.5 LV SV:         28 LV SV Index:   16 LVOT Area:     3.14 cm  LEFT ATRIUM              Index        RIGHT ATRIUM           Index LA diam:        4.60 cm  2.59 cm/m   RA Area:     22.60 cm LA Vol (A2C):   87.2 ml  49.14 ml/m  RA Volume:   75.90 ml  42.77 ml/m LA Vol (A4C):   111.0 ml 62.55 ml/m LA Biplane Vol: 99.9 ml  56.29 ml/m  AORTIC VALVE                    PULMONIC VALVE AV Area (Vmax):    2.17 cm     PV Vmax:        0.85 m/s AV Area (Vmean):   2.06 cm     PV Vmean:       55.700 cm/s AV Area (VTI):     2.46 cm     PV VTI:         0.107 m AV Vmax:           74.60 cm/s   PV Peak grad:   2.9 mmHg AV Vmean:          52.350 cm/s  PV Mean grad:   1.0 mmHg AV VTI:            0.114 m      RVOT Peak grad: 1 mmHg AV Peak Grad:      2.2 mmHg AV Mean Grad:      1.0 mmHg LVOT Vmax:         51.60 cm/s LVOT Vmean:        34.300 cm/s LVOT VTI:          0.089 m LVOT/AV VTI ratio: 0.78  AORTA Ao Root diam: 3.05 cm MITRAL VALVE MV Area (PHT): 6.43 cm     SHUNTS MV Area VTI:   1.45 cm     Systemic VTI:  0.09 m MV Peak grad:  6.7 mmHg     Systemic Diam: 2.00 cm MV Mean grad:  2.0 mmHg     Pulmonic VTI:  0.067 m MV Vmax:       1.29 m/s MV Vmean:      65.2 cm/s MV Decel Time: 118 msec MV E velocity: 63.40 cm/s MV A velocity: 112.00 cm/s MV E/A ratio:  0.57 Isaias Cowman MD Electronically signed by Isaias Cowman MD Signature Date/Time: 01/30/2022/4:37:47 PM    Final     Assessment:   # Symptomatic iron deficiency anemia # Acute respiratory failure with hypoxia- improving # DM2 # COPD # Anxiety and Depression  Plan:  Still w/o signs of GIB EGD and Colonoscopy initially planned for today- cancelled after d/w  anesthesia given continued supplemental O2 requirement  Ok to resume clear liquid diet Continue iv iron Pt has been diuresed and clinically improved Will discuss with  patient's husband and patient in room later today to determine if she would rather stay inpatient vs outpatient. If inpatient, will plan for Monday with Dr. Marius Ditch if off oxygen and prep again Potential video capsule endoscopy if EGD/Colonoscopy negative  I personally performed the service.  Management of other medical comorbidities as per primary team  Thank you for allowing [place] to participate in this patient's care. Please don't hesitate to call if any questions or concerns arise.   Annamaria Helling, DO [facility]  Portions of the record may have been created with voice recognition software. Occasional wrong-word or 'sound-a-like' substitutions may have occurred due to the inherent limitations of voice recognition software.  Read the chart carefully and recognize, using context, where substitutions may have occurred.

## 2022-02-01 NOTE — Assessment & Plan Note (Addendum)
Resolved

## 2022-02-01 NOTE — Progress Notes (Signed)
?Progress Note ? ?Patient: Jessica Stout YTK:[id number] DOB: August 13, 1951  ?DOA: 01/29/2022  DOS: 02/01/2022  ?  ?Brief hospital course: ?Jessica Stout is a 71 y.o. female with a history of T2DM, HTN, HLD, anxiety/depression who presented to the ED 3/1 with several weeks of dyspnea with exertion, worsening to dyspnea at rest associated with leg swelling and severe, progressive fatigue. She had been evaluated by cardiology recently, echocardiogram ordered and pending. GI referral was made due to significant anemia (hgb 9.3g/dl from previous 14g/dl), and an episode of pink stool. In the ED she was afebrile, newly hypoxemic, with sinus tachycardia. CXR with cardiomegaly, pulmonary edema, BNP recently >2000. IV lasix given with some improvement, GDMT initiated with cardiology consultation for LVEF 45-50%. With improved cardiorespiratory status, plan was to undergo colonoscopy, EGD +/- VCE 3/4. She was sent down with supplemental oxygen (though not noted to be hypoxic), so anesthesia deferred work up.  ? ?Assessment and Plan: ?* New onset of HFrEF ?LVEF 45-50%, mild-mod MR, IVC normal. ?- Continue lasix 40mg  IV BID again today, renal function robust.  ?- ARB, BB ordered for GDMT. ?- Seen at [facility], appreciate recommendations. ? ?Hypokalemia ?- Supplement, check again tomorrow with mag. ? ?Iron deficiency anemia ?Microcytic and trending down past few months, possible GI bleed vs. nutritional deficiency. Iron stores low, high TIBC, 5% sat. Bilirubin wnl. ?- IV iron given 3/3, will discharge on oral supplementation. ?- GI consulted, planning EGD, colonoscopy which was delayed today. Will reschedule. Hgb has continued to trend downward, to 9g/dl today. Will continue trending to stability until f/u.  ?- With concern for GI bleed, use SCDs for VTE ppx for now. ? ?Acute respiratory failure with hypoxia ([place]) ?Due to new diagnosis CHF with pulmonary edema. Has baseline COPD, though no wheezing to suggest exacerbation, just  lowers pulmonary reserve. ?- Continue diuresis.  ?- Wean to room air.  ? ?Anxiety and depression ?Quiescent.  ?- Continue home effexor ? ?Hyperlipidemia ?Pt not taking rosuvastatin. No definitive PAD, CAD, though with DM, would qualify for statin regardless. LDL is 48. Will discuss with patient. ? ?Chronic obstructive pulmonary disease ([place]) ?No home controlled medications. No evidence of exacerbation.  ?- prn duoneb. ? ?Diabetes mellitus type 2, controlled, without complications ([place]) ?- HbA1c pending ?- Hold home sitaglipitin, metformin ?- Covering with novolog 3u TIDWC + mod SSI. May need to reinitiate home basal insulin.  ? ? ?Subjective: Breathing is nearly back to baseline, still dyspneic with exertion but no chest pain, off O2 this AM. Anesthesia canceled GI cases due to hypoxia, despite clearance by medical and cardiology teams. The patient has no active bleeding, though hgb continues to trend downward and she'd prefer to have procedures performed prior to discharge. Leg swelling not back to baseline either. ? ?Objective: ?Vitals:  ? 01/31/22 2328 02/01/22 0409 02/01/22 0500 02/01/22 1115  ?BP: (!) 108/57 113/62  (!) 115/59  ?Pulse: 77 82  76  ?Resp: 18 16  18   ?Temp: 97.8 ?F (36.6 ?C) 97.8 ?F (36.6 ?C)    ?TempSrc:      ?SpO2: 91% 91%  92%  ?Weight:   66.8 kg   ?Height:      ? ?Gen: 71 y.o. female in no distress, standing at bedside without oxygen ?Pulm: Nonlabored breathing room air, crackles largely resolved, no wheezes. ?CV: Regular rate and rhythm. No murmur, rub, or gallop. No JVD, 1+ symmetric dependent edema. ?GI: Abdomen soft, non-tender, non-distended, with normoactive bowel sounds.  ?Ext: Warm, no deformities ?Skin: No  rashes, lesions or ulcers on visualized skin. ?Neuro: Alert and oriented. No focal neurological deficits. ?Psych: Judgement and insight appear fair. Mood euthymic & affect congruent. Behavior is appropriate.   ? ?Data Personally reviewed: ?Hgb 9.3 > 9.2 > 9, microcytic indices,  ferritin 11, iron 27, TIBC 545, 5% sat, B12 558, folate 12.9. ?Na 146 > 143 ?SCr 0.75 > 0.93 > 0.77 > 0.48 ?LFTs wnl, TBili 0.3.  ?K 3.3 ?LDL 48, HDL 36.  ?Glucose remains at inpatient goal.  ? ?CXR (personal review): Cardiomegaly with vascular congestion centrally, suggestion of effusions.  ?   ?SARS-CoV-2 PCR (3/1): Negative ?Influenza A/B: Negative ? ?Family Communication:  None at bedside ? ?Disposition: ?Status is: Inpatient ?Remains inpatient appropriate because: Ongoing IV diuresis, GI bleed work up ?Planned Discharge Destination: Home ? ?Patrecia Pour, MD ?02/01/2022 11:32 AM ?Page by [email or web address]  ?

## 2022-02-01 NOTE — Progress Notes (Signed)
[facility] Cardiology  SUBJECTIVE: Patient sitting on side of the bed, reports doing well overall, with improved breathing   Vitals:   01/31/22 1947 01/31/22 2328 02/01/22 0409 02/01/22 0500  BP: 135/71 (!) 108/57 113/62   Pulse: 76 77 82   Resp: (!) 24 18 16    Temp: 97.6 F (36.4 C) 97.8 F (36.6 C) 97.8 F (36.6 C)   TempSrc:      SpO2: 93% 91% 91%   Weight:    66.8 kg  Height:         Intake/Output Summary (Last 24 hours) at 02/01/2022 0941 Last data filed at 01/31/2022 1905 Gross per 24 hour  Intake 2843.25 ml  Output --  Net 2843.25 ml      PHYSICAL EXAM  General: Well developed, well nourished, in no acute distress HEENT:  Normocephalic and atramatic Neck:  No JVD.  Lungs: Clear bilaterally to auscultation and percussion. Heart: HRRR . Normal S1 and S2 without gallops or murmurs.  Abdomen: Bowel sounds are positive, abdomen soft and non-tender  Msk:  Back normal, normal gait. Normal strength and tone for age. Extremities: No clubbing, cyanosis or edema.   Neuro: Alert and oriented X 3. Psych:  Good affect, responds appropriately   LABS: Basic Metabolic Panel: Recent Labs    01/29/22 1124 01/30/22 0524 01/31/22 0433 02/01/22 0526  NA 144   < > 143 144  K 4.4   < > 3.4* 3.3*  CL 105   < > 102 103  CO2 31   < > 33* 32  GLUCOSE 93   < > 105* 125*  BUN 14   < > 22 20  CREATININE 0.75   < > 0.77 0.48  CALCIUM 9.1   < > 8.5* 8.5*  MG 1.5*  --  1.7  --    < > = values in this interval not displayed.   Liver Function Tests: Recent Labs    01/29/22 1124  AST 14*  ALT 9  ALKPHOS 41  BILITOT 0.3  PROT 6.3*  ALBUMIN 3.0*   No results for input(s): LIPASE, AMYLASE in the last 72 hours. CBC: Recent Labs    01/30/22 0524 02/01/22 0526  WBC 9.4 6.9  HGB 9.2* 9.0*  HCT 32.4* 31.6*  MCV 78.8* 77.3*  PLT 271 271   Cardiac Enzymes: No results for input(s): CKTOTAL, CKMB, CKMBINDEX, TROPONINI in the last 72 hours. BNP: Invalid input(s):  POCBNP D-Dimer: No results for input(s): DDIMER in the last 72 hours. Hemoglobin A1C: Recent Labs    01/29/22 1124  HGBA1C 6.5*   Fasting Lipid Panel: Recent Labs    01/30/22 0524  CHOL 108  HDL 36*  LDLCALC 48  TRIG 119  CHOLHDL 3.0   Thyroid Function Tests: No results for input(s): TSH, T4TOTAL, T3FREE, THYROIDAB in the last 72 hours.  Invalid input(s): FREET3 Anemia Panel: Recent Labs    01/30/22 0524 01/30/22 0929  VITAMINB12  --  558  FOLATE  --  12.9  FERRITIN 11  --   TIBC 545*  --   IRON 27*  --   RETICCTPCT 2.5  --     DG Chest 1 View  Result Date: 01/31/2022 CLINICAL DATA:  Shortness of breath. EXAM: CHEST  1 VIEW COMPARISON:  Radiographs 01/29/2022. FINDINGS: 0828 hours. Stable cardiac enlargement with small bilateral pleural effusions and bibasilar atelectasis. There is vascular congestion with possible mild edema. No evidence of pneumothorax. The bones appear unremarkable. Telemetry leads overlie the chest. IMPRESSION:  Stable examination with cardiomegaly, bilateral pleural effusions and possible mild edema. Electronically Signed   By: Richardean Sale M.D.   On: 01/31/2022 09:47   ECHOCARDIOGRAM COMPLETE  Result Date: 01/30/2022    ECHOCARDIOGRAM REPORT   Patient Name:   Jessica Stout Date of Exam: 01/30/2022 Medical Rec #:  [id number]   Height:       64.0 in Accession #:    [id number]  Weight:       159.0 lb Date of Birth:  1951/03/01   BSA:          1.775 m Patient Age:    71 years    BP:           141/72 mmHg Patient Gender: F           HR:           58 bpm. Exam Location:  [facility] Procedure: 2D Echo, Cardiac Doppler and Color Doppler Indications:     CHF I50.9  History:         Patient has no prior history of Echocardiogram examinations. No                  past medical history on file.  Sonographer:     Sherrie Sport Referring Phys:  [id number] Emeterio Reeve Diagnosing Phys: Isaias Cowman MD  Sonographer Comments: Suboptimal apical window. IMPRESSIONS  1. Left  ventricular ejection fraction, by estimation, is 45 to 50%. The left ventricle has mildly decreased function. The left ventricle has no regional wall motion abnormalities. There is mild left ventricular hypertrophy. Left ventricular diastolic parameters are indeterminate.  2. Right ventricular systolic function is normal. The right ventricular size is normal.  3. The mitral valve is normal in structure. Mild to moderate mitral valve regurgitation. No evidence of mitral stenosis.  4. The aortic valve is normal in structure. Aortic valve regurgitation is not visualized. No aortic stenosis is present.  5. The inferior vena cava is normal in size with greater than 50% respiratory variability, suggesting right atrial pressure of 3 mmHg. FINDINGS  Left Ventricle: Left ventricular ejection fraction, by estimation, is 45 to 50%. The left ventricle has mildly decreased function. The left ventricle has no regional wall motion abnormalities. The left ventricular internal cavity size was normal in size. There is mild left ventricular hypertrophy. Left ventricular diastolic parameters are indeterminate. Right Ventricle: The right ventricular size is normal. No increase in right ventricular wall thickness. Right ventricular systolic function is normal. Left Atrium: Left atrial size was normal in size. Right Atrium: Right atrial size was normal in size. Pericardium: There is no evidence of pericardial effusion. Mitral Valve: The mitral valve is normal in structure. Mild to moderate mitral valve regurgitation. No evidence of mitral valve stenosis. MV peak gradient, 6.7 mmHg. The mean mitral valve gradient is 2.0 mmHg. Tricuspid Valve: The tricuspid valve is normal in structure. Tricuspid valve regurgitation is mild . No evidence of tricuspid stenosis. Aortic Valve: The aortic valve is normal in structure. Aortic valve regurgitation is not visualized. No aortic stenosis is present. Aortic valve mean gradient measures 1.0 mmHg.  Aortic valve peak gradient measures 2.2 mmHg. Aortic valve area, by VTI measures 2.46 cm. Pulmonic Valve: The pulmonic valve was normal in structure. Pulmonic valve regurgitation is not visualized. No evidence of pulmonic stenosis. Aorta: The aortic root is normal in size and structure. Venous: The inferior vena cava is normal in size with greater than 50% respiratory variability, suggesting right atrial pressure of  3 mmHg. IAS/Shunts: No atrial level shunt detected by color flow Doppler.  LEFT VENTRICLE PLAX 2D LVIDd:         5.10 cm   Diastology LVIDs:         3.90 cm   LV e' medial:    3.26 cm/s LV PW:         1.40 cm   LV E/e' medial:  19.4 LV IVS:        1.20 cm   LV e' lateral:   4.68 cm/s LVOT diam:     2.00 cm   LV E/e' lateral: 13.5 LV SV:         28 LV SV Index:   16 LVOT Area:     3.14 cm  LEFT ATRIUM              Index        RIGHT ATRIUM           Index LA diam:        4.60 cm  2.59 cm/m   RA Area:     22.60 cm LA Vol (A2C):   87.2 ml  49.14 ml/m  RA Volume:   75.90 ml  42.77 ml/m LA Vol (A4C):   111.0 ml 62.55 ml/m LA Biplane Vol: 99.9 ml  56.29 ml/m  AORTIC VALVE                    PULMONIC VALVE AV Area (Vmax):    2.17 cm     PV Vmax:        0.85 m/s AV Area (Vmean):   2.06 cm     PV Vmean:       55.700 cm/s AV Area (VTI):     2.46 cm     PV VTI:         0.107 m AV Vmax:           74.60 cm/s   PV Peak grad:   2.9 mmHg AV Vmean:          52.350 cm/s  PV Mean grad:   1.0 mmHg AV VTI:            0.114 m      RVOT Peak grad: 1 mmHg AV Peak Grad:      2.2 mmHg AV Mean Grad:      1.0 mmHg LVOT Vmax:         51.60 cm/s LVOT Vmean:        34.300 cm/s LVOT VTI:          0.089 m LVOT/AV VTI ratio: 0.78  AORTA Ao Root diam: 3.05 cm MITRAL VALVE MV Area (PHT): 6.43 cm     SHUNTS MV Area VTI:   1.45 cm     Systemic VTI:  0.09 m MV Peak grad:  6.7 mmHg     Systemic Diam: 2.00 cm MV Mean grad:  2.0 mmHg     Pulmonic VTI:  0.067 m MV Vmax:       1.29 m/s MV Vmean:      65.2 cm/s MV Decel Time: 118 msec  MV E velocity: 63.40 cm/s MV A velocity: 112.00 cm/s MV E/A ratio:  0.57 Isaias Cowman MD Electronically signed by Isaias Cowman MD Signature Date/Time: 01/30/2022/4:37:47 PM    Final      Echo LVEF 45-50% with mild to moderate mitral gravitation 01/30/2022  TELEMETRY: Sinus rhythm:  ASSESSMENT AND PLAN:  Principal Problem:   New  onset of HFrEF Active Problems:   Diabetes mellitus type 2, controlled, without complications (HCC)   Chronic obstructive pulmonary disease (HCC)   Hyperlipidemia   Anxiety and depression   Acute respiratory failure with hypoxia (HCC)   Iron deficiency anemia    1.  Acute on chronic diastolic congestive heart failure, HFpEF, LVEF 45-50%, improved after initial diuresis, on furosemide 40 mg IV twice daily, on 2 L by nasal cannula 2.  Borderline elevated high-sensitivity troponin (37, 40), in the absence of chest pain or ischemic ECG changes, likely demand supply ischemia 3.  Iron deficiency anemia, EGD and colonoscopy pending, postponed today since patient still requiring supplemental O2, tentatively scheduled for 02/03/2022  Recommendations  1.  Agree with current therapy 2.  Continue diuresis 3.  Carefully monitor renal status 4.  EGD and colonoscopy pending 5.  Defer further cardiac diagnostics at this time   Isaias Cowman, MD, PhD, [facility] 02/01/2022 9:41 AM

## 2022-02-02 DIAGNOSIS — I509 Heart failure, unspecified: Secondary | ICD-10-CM | POA: Diagnosis not present

## 2022-02-02 DIAGNOSIS — J449 Chronic obstructive pulmonary disease, unspecified: Secondary | ICD-10-CM | POA: Diagnosis not present

## 2022-02-02 DIAGNOSIS — J9601 Acute respiratory failure with hypoxia: Secondary | ICD-10-CM | POA: Diagnosis not present

## 2022-02-02 DIAGNOSIS — F419 Anxiety disorder, unspecified: Secondary | ICD-10-CM | POA: Diagnosis not present

## 2022-02-02 LAB — BASIC METABOLIC PANEL
Anion gap: 9 (ref 5–15)
BUN: 16 mg/dL (ref 8–23)
CO2: 31 mmol/L (ref 22–32)
Calcium: 8.9 mg/dL (ref 8.9–10.3)
Chloride: 101 mmol/L (ref 98–111)
Creatinine, Ser: 0.54 mg/dL (ref 0.44–1.00)
GFR, Estimated: >60 mL/min (ref >60)
Glucose, Bld: 124 mg/dL — ABNORMAL HIGH (ref 70–99)
Potassium: 4 mmol/L (ref 3.5–5.1)
Sodium: 141 mmol/L (ref 135–145)

## 2022-02-02 LAB — CBC
HCT: 31.7 % — ABNORMAL LOW (ref 36.0–46.0)
Hemoglobin: 9 g/dL — ABNORMAL LOW (ref 12.0–15.0)
MCH: 22 pg — ABNORMAL LOW (ref 26.0–34.0)
MCHC: 28.4 g/dL — ABNORMAL LOW (ref 30.0–36.0)
MCV: 77.3 fL — ABNORMAL LOW (ref 80.0–100.0)
Platelets: 298 10*3/uL (ref 150–400)
RBC: 4.1 MIL/uL (ref 3.87–5.11)
RDW: 16.9 % — ABNORMAL HIGH (ref 11.5–15.5)
WBC: 7.7 10*3/uL (ref 4.0–10.5)
nRBC: 0 % (ref 0.0–0.2)

## 2022-02-02 LAB — GLUCOSE, CAPILLARY
Glucose-Capillary: 119 mg/dL — ABNORMAL HIGH (ref 70–99)
Glucose-Capillary: 126 mg/dL — ABNORMAL HIGH (ref 70–99)
Glucose-Capillary: 97 mg/dL (ref 70–99)
Glucose-Capillary: 105 mg/dL — ABNORMAL HIGH (ref 70–99)
Glucose-Capillary: 134 mg/dL — ABNORMAL HIGH (ref 70–99)

## 2022-02-02 LAB — MAGNESIUM: Magnesium: 1.9 mg/dL (ref 1.7–2.4)

## 2022-02-02 MED ORDER — PEG 3350-KCL-NA BICARB-NACL 420 G PO SOLR
2000.0000 mL | Freq: Once | ORAL | Status: DC | PRN
  Filled 2022-02-02: qty 4000

## 2022-02-02 MED ORDER — FUROSEMIDE 10 MG/ML IJ SOLN
40.0000 mg | Freq: Two times a day (BID) | INTRAMUSCULAR | Status: AC
  Administered 2022-02-02 – 2022-02-03 (×2): 40 mg via INTRAVENOUS
  Filled 2022-02-02 (×2): qty 4

## 2022-02-02 MED ORDER — MELATONIN 5 MG PO TABS
2.5000 mg | ORAL_TABLET | Freq: Every day | ORAL | Status: DC
  Administered 2022-02-02 – 2022-02-04 (×3): 2.5 mg via ORAL
  Filled 2022-02-02 (×3): qty 1

## 2022-02-02 MED ORDER — PEG 3350-KCL-NA BICARB-NACL 420 G PO SOLR
4000.0000 mL | Freq: Once | ORAL | Status: AC
  Administered 2022-02-02: 4000 mL via ORAL
  Filled 2022-02-02: qty 4000

## 2022-02-02 NOTE — Progress Notes (Signed)
Patient had an uneventful night. Clear liquid diet maintained. No s/s of GI bleed or discomfort. On 1L O2 via [place]. C/o chronic leg pain that was relieved with PRN tylenol. Safety maintained, call bell kept within reach. Slept through the night.  ?

## 2022-02-02 NOTE — Progress Notes (Signed)
[facility] Cardiology ? ?SUBJECTIVE: Patient sitting on side of the bed, eating breakfast, reports doing well, denies chest pain or shortness of breath ? ? ?Vitals:  ? 02/01/22 2358 02/02/22 8676 02/02/22 0447 02/02/22 0850  ?BP: (!) 101/39 119/61  (!) 119/57  ?Pulse: 76 75  75  ?Resp: 19 19  18   ?Temp: 98.4 ?F (36.9 ?C)   98.1 ?F (36.7 ?C)  ?TempSrc: Oral   Oral  ?SpO2: 97% 97%  94%  ?Weight:   66.7 kg   ?Height:      ? ? ? ?Intake/Output Summary (Last 24 hours) at 02/02/2022 0913 ?Last data filed at 02/02/2022 0805 ?Gross per 24 hour  ?Intake 840 ml  ?Output 0 ml  ?Net 840 ml  ? ? ? ? ?PHYSICAL EXAM ? ?General: Well developed, well nourished, in no acute distress ?HEENT:  Normocephalic and atramatic ?Neck:  No JVD.  ?Lungs: Clear bilaterally to auscultation and percussion. ?Heart: HRRR . Normal S1 and S2 without gallops or murmurs.  ?Abdomen: Bowel sounds are positive, abdomen soft and non-tender  ?Msk:  Back normal, normal gait. Normal strength and tone for age. ?Extremities: No clubbing, cyanosis or edema.   ?Neuro: Alert and oriented X 3. ?Psych:  Good affect, responds appropriately ? ? ?LABS: ?Basic Metabolic Panel: ?Recent Labs  ?  01/31/22 ?0433 02/01/22 ?7209 02/02/22 ?4709  ?NA 143 144 141  ?K 3.4* 3.3* 4.0  ?CL 102 103 101  ?CO2 33* 32 31  ?GLUCOSE 105* 125* 124*  ?BUN 22 20 16   ?CREATININE 0.77 0.48 0.54  ?CALCIUM 8.5* 8.5* 8.9  ?MG 1.7  --  1.9  ? ?Liver Function Tests: ?No results for input(s): AST, ALT, ALKPHOS, BILITOT, PROT, ALBUMIN in the last 72 hours. ?No results for input(s): LIPASE, AMYLASE in the last 72 hours. ?CBC: ?Recent Labs  ?  02/01/22 ?6283 02/02/22 ?6629  ?WBC 6.9 7.7  ?HGB 9.0* 9.0*  ?HCT 31.6* 31.7*  ?MCV 77.3* 77.3*  ?PLT 271 298  ? ?Cardiac Enzymes: ?No results for input(s): CKTOTAL, CKMB, CKMBINDEX, TROPONINI in the last 72 hours. ?BNP: ?Invalid input(s): POCBNP ?D-Dimer: ?No results for input(s): DDIMER in the last 72 hours. ?Hemoglobin A1C: ?No results for input(s): HGBA1C in the last 72  hours. ?Fasting Lipid Panel: ?No results for input(s): CHOL, HDL, LDLCALC, TRIG, CHOLHDL, LDLDIRECT in the last 72 hours. ?Thyroid Function Tests: ?No results for input(s): TSH, T4TOTAL, T3FREE, THYROIDAB in the last 72 hours. ? ?Invalid input(s): FREET3 ?Anemia Panel: ?Recent Labs  ?  01/30/22 ?0929  ?VITAMINB12 558  ?FOLATE 12.9  ? ? ?No results found. ? ? ?Echo EF 45-50% with mild-moderate mitral regurgitation 01/30/2022 ? ?TELEMETRY: Sinus rhythm at 73 bpm: ? ?ASSESSMENT AND PLAN: ? ?Principal Problem: ?  New onset of HFrEF ?Active Problems: ?  Diabetes mellitus type 2, controlled, without complications ([place]) ?  Chronic obstructive pulmonary disease (HCC) ?  Hyperlipidemia ?  Anxiety and depression ?  Acute respiratory failure with hypoxia ([place]) ?  Iron deficiency anemia ?  Hypokalemia ?  ? ?1. Acute on chronic diastolic congestive heart failure, HFpEF, LVEF 45-50%, improved after initial diuresis, on furosemide 40 mg IV twice daily, on 2 L by nasal cannula ?2.  Borderline elevated high-sensitivity troponin (37, 40), in the absence of chest pain or ischemic ECG changes, likely demand supply ischemia ?3.  Iron deficiency anemia, EGD and colonoscopy pending, postponed today since patient still requiring supplemental O2, tentatively scheduled for 02/03/2022 ?  ?Recommendations ?  ?1.  Agree with  current therapy ?2.  Continue diuresis ?3.  Carefully monitor renal status ?4.  EGD and colonoscopy scheduled for 02/03/2022 ?5.  Defer further cardiac diagnostics at this time ? ? ?Isaias Cowman, MD, PhD, [facility] ?02/02/2022 ?9:13 AM ? ? ? ?  ?

## 2022-02-02 NOTE — Progress Notes (Signed)
?Progress Note ? ?Patient: Jessica Stout MCN:[id number] DOB: 03-Jan-1951  ?DOA: 01/29/2022  DOS: 02/02/2022  ?  ?Brief hospital course: ?Jessica Stout is a 71 y.o. female with a history of T2DM, HTN, HLD, anxiety/depression who presented to the ED 3/1 with several weeks of dyspnea with exertion, worsening to dyspnea at rest associated with leg swelling and severe, progressive fatigue. She had been evaluated by cardiology recently, echocardiogram ordered and pending. GI referral was made due to significant anemia (hgb 9.3g/dl from previous 14g/dl), and an episode of pink stool. In the ED she was afebrile, newly hypoxemic, with sinus tachycardia. CXR with cardiomegaly, pulmonary edema, BNP recently >2000. IV lasix given with some improvement, GDMT initiated with cardiology consultation for LVEF 45-50%. With improved cardiorespiratory status, plan was to undergo colonoscopy, EGD +/- VCE 3/4. She was sent down with supplemental oxygen (though not noted to be hypoxic), so anesthesia deferred work up. Diuresis has continued, hypoxia improved. Plan is for GI work up 3/6. ? ?Assessment and Plan: ?* New onset of HFrEF ?LVEF 45-50%, mild-mod MR, IVC normal. ?- Continue lasix 40mg  IV BID again today, renal function robust.  ?- ARB, BB ordered for GDMT. ?- Seen at [facility], appreciate recommendations. ? ?Hypokalemia ?K is 4.0 now. ? ?Iron deficiency anemia ?Microcytic and trending down past few months, possible GI bleed vs. nutritional deficiency. Iron stores low, high TIBC, 5% sat. Bilirubin wnl. ?- IV iron given 3/3, will discharge on oral supplementation. ?- Hgb stabilized at 9g/dl. ?- GI consulted, planning EGD, colonoscopy which was delayed on 3/3, rescheduled for 3/6.  ?- With concern for GI bleed, use SCDs for VTE ppx for now. ? ?Acute respiratory failure with hypoxia ([place]) ?Due to new diagnosis CHF with pulmonary edema. Has baseline COPD, though no wheezing to suggest exacerbation, just lowers pulmonary reserve. ?-  Continue diuresis.  ?- Wean to room air. D/w RN. ? ?Anxiety and depression ?Quiescent.  ?- Continue home effexor ? ?Hyperlipidemia ?Pt not taking rosuvastatin. No definitive PAD, CAD, though with DM, would qualify for statin regardless. LDL is 48. Will discuss with patient. ? ?Chronic obstructive pulmonary disease ([place]) ?No home controlled medications. No evidence of exacerbation.  ?- prn duoneb. ? ?Diabetes mellitus type 2, controlled, without complications ([place]) ?HbA1c 6.5%.  ?- Hold home sitaglipitin, metformin ?- Covering with novolog 3u TIDWC + mod SSI. Remaining at inpatient goal without hypoglycemia. ? ? ?Subjective: Feels no shortness of breath. When ambulating she dropped to ~88% yesterday, so has been on 1L O2. No chest pain, cough. Her leg swelling has nearly resolved. Denies orthopnea. ? ?Objective: ?Vitals:  ? 02/02/22 0444 02/02/22 0447 02/02/22 0850 02/02/22 1050  ?BP: 119/61  (!) 119/57   ?Pulse: 75  75   ?Resp: 19  18   ?Temp:   98.1 ?F (36.7 ?C)   ?TempSrc:   Oral   ?SpO2: 97%  94% 92%  ?Weight:  66.7 kg    ?Height:      ? ?Gen: 71 y.o. female in no distress ?Pulm: Nonlabored breathing room air. Clear to the bases. ?CV: Regular rate and rhythm. No murmur, rub, or gallop. No JVD, trace dependent edema. ?GI: Abdomen soft, non-tender, non-distended, with normoactive bowel sounds.  ?Ext: Warm, no deformities ?Skin: No rashes, lesions or ulcers on visualized skin. ?Neuro: Alert and oriented. No focal neurological deficits. ?Psych: Judgement and insight appear fair. Mood euthymic & affect congruent. Behavior is appropriate.   ? ?Data Personally reviewed: ?Hgb 9.3 > 9.2 > 9 > 9, microcytic  indices, ferritin 11, iron 27, TIBC 545, 5% sat, B12 558, folate 12.9. ?Na 146 > 143 ?SCr 0.75 > 0.93 > 0.77 > 0.48 > 0.54 ?LFTs wnl, TBili 0.3.  ?K 3.3 ?LDL 48, HDL 36.  ?Glucose remains at inpatient goal.  ? ?CXR (personal review): Cardiomegaly with vascular congestion centrally, suggestion of effusions.  ?    ?SARS-CoV-2 PCR (3/1): Negative ?Influenza A/B: Negative ? ?Family Communication:  None at bedside ? ?Disposition: ?Status is: Inpatient ?Remains inpatient appropriate because: Ongoing IV diuresis, GI bleed work up ?Planned Discharge Destination: Home ? ?Patrecia Pour, MD ?02/02/2022 11:21 AM ?Page by [email or web address]  ?

## 2022-02-02 NOTE — Progress Notes (Signed)
? Inpatient Follow-up/Progress Note ?  ?Patient ID: Jessica Stout is a 71 y.o. female. ? ?Overnight Events / Subjective Findings ?NAEON. Pt down to [facility] o/n. Plan for EGD and Colonoscopy with Dr. Marius Ditch tomorrow if completely off of supplemental O2. Tolerating clear liquids ?SOB continues to improve. No chest pain. Some abdominal cramping today. No signs of GIB. Hgb stable on labs today. ?No other acute gi complaints. ? ?Review of Systems  ?Constitutional:  Negative for activity change, appetite change, chills, diaphoresis, fatigue, fever and unexpected weight change.  ?HENT:  Negative for trouble swallowing and voice change.   ?Respiratory:  Negative for shortness of breath and wheezing.   ?Cardiovascular:  Negative for chest pain, palpitations and leg swelling.  ?Gastrointestinal:  Negative for abdominal distention, abdominal pain, anal bleeding, blood in stool, constipation, diarrhea, nausea, rectal pain and vomiting.  ?Musculoskeletal:  Negative for arthralgias and myalgias.  ?Skin:  Negative for color change and pallor.  ?Neurological:  Negative for dizziness, syncope and weakness.  ?Psychiatric/Behavioral:  Negative for confusion.   ?All other systems reviewed and are negative.  ? ?Medications ? ?Current Facility-Administered Medications:  ?  0.9 %  sodium chloride infusion, 250 mL, Intravenous, PRN, Emeterio Reeve, DO ?  acetaminophen (TYLENOL) tablet 650 mg, 650 mg, Oral, Q4H PRN, Emeterio Reeve, DO, 650 mg at 02/01/22 2004 ?  furosemide (LASIX) injection 40 mg, 40 mg, Intravenous, BID, Patrecia Pour, MD, 40 mg at 02/01/22 1644 ?  insulin aspart (novoLOG) injection 0-15 Units, 0-15 Units, Subcutaneous, TID WC, Emeterio Reeve, DO, 2 Units at 02/01/22 1204 ?  insulin aspart (novoLOG) injection 0-5 Units, 0-5 Units, Subcutaneous, QHS, Emeterio Reeve, DO ?  insulin aspart (novoLOG) injection 3 Units, 3 Units, Subcutaneous, TID WC, Emeterio Reeve, DO, 3 Units at 02/01/22 1644 ?   ipratropium-albuterol (DUONEB) 0.5-2.5 (3) MG/3ML nebulizer solution 3 mL, 3 mL, Nebulization, Q4H PRN, Emeterio Reeve, DO ?  losartan (COZAAR) tablet 25 mg, 25 mg, Oral, Daily, Emeterio Reeve, DO, 25 mg at 02/01/22 0920 ?  magnesium oxide (MAG-OX) tablet 400 mg, 400 mg, Oral, BID, Tang, Alanson Puls, PA-C, 400 mg at 02/01/22 2120 ?  metoprolol succinate (TOPROL-XL) 24 hr tablet 25 mg, 25 mg, Oral, Daily, Tang, [place], PA-C, 25 mg at 02/01/22 0920 ?  ondansetron (ZOFRAN) injection 4 mg, 4 mg, Intravenous, Q6H PRN, Emeterio Reeve, DO ?  polyethylene glycol-electrolytes (NuLYTELY) solution 2,000 mL, 2,000 mL, Oral, Once PRN, Annamaria Helling, DO ?  polyethylene glycol-electrolytes (NuLYTELY) solution 4,000 mL, 4,000 mL, Oral, Once, Annamaria Helling, DO ?  sodium chloride flush (NS) 0.9 % injection 3 mL, 3 mL, Intravenous, Q12H, Emeterio Reeve, DO, 3 mL at 02/01/22 2120 ?  sodium chloride flush (NS) 0.9 % injection 3 mL, 3 mL, Intravenous, PRN, Emeterio Reeve, DO ?  venlafaxine XR (EFFEXOR-XR) 24 hr capsule 75 mg, 75 mg, Oral, Q breakfast, Emeterio Reeve, DO, 75 mg at 02/01/22 7482 ? sodium chloride    ?  ?sodium chloride, acetaminophen, ipratropium-albuterol, ondansetron (ZOFRAN) IV, polyethylene glycol-electrolytes, sodium chloride flush  ? ?Objective  ? ? ?Vitals:  ? 02/01/22 1955 02/01/22 2358 02/02/22 0444 02/02/22 0447  ?BP: 107/76 (!) 101/39 119/61   ?Pulse: 79 76 75   ?Resp: 18 19 19    ?Temp: 97.9 ?F (36.6 ?C) 98.4 ?F (36.9 ?C)    ?TempSrc: Oral Oral    ?SpO2: 92% 97% 97%   ?Weight:    66.7 kg  ?Height:      ? ? ? ?Physical Exam ?  Vitals and nursing note reviewed.  ?Constitutional:   ?   General: She is not in acute distress. ?   Appearance: Normal appearance. She is not ill-appearing, toxic-appearing or diaphoretic.  ?HENT:  ?   Head: Normocephalic and atraumatic.  ?   Nose: Nose normal.  ?   Mouth/Throat:  ?   Mouth: Mucous membranes are moist.  ?   Pharynx: Oropharynx  is clear.  ?Eyes:  ?   General: No scleral icterus. ?   Extraocular Movements: Extraocular movements intact.  ?Cardiovascular:  ?   Rate and Rhythm: Normal rate and regular rhythm.  ?   Heart sounds: Normal heart sounds. No murmur heard. ?  No friction rub. No gallop.  ?Pulmonary:  ?   Effort: Pulmonary effort is normal. No respiratory distress.  ?   Breath sounds: Normal breath sounds. No wheezing, rhonchi or rales.  ?   Comments: 1LNC ?Abdominal:  ?   General: Abdomen is flat. Bowel sounds are normal. There is no distension.  ?   Palpations: Abdomen is soft.  ?   Tenderness: There is no abdominal tenderness. There is no guarding or rebound.  ?Musculoskeletal:  ?   Cervical back: Neck supple.  ?   Right lower leg: No edema (improving).  ?   Left lower leg: No edema (improving).  ?Skin: ?   General: Skin is warm and dry.  ?   Coloration: Skin is not jaundiced or pale.  ?Neurological:  ?   General: No focal deficit present.  ?   Mental Status: She is alert and oriented to person, place, and time. Mental status is at baseline.  ?Psychiatric:     ?   Mood and Affect: Mood normal.     ?   Behavior: Behavior normal.     ?   Thought Content: Thought content normal.     ?   Judgment: Judgment normal.  ? ? ? ?Laboratory Data ?Recent Labs  ?Lab 01/30/22 ?4259 02/01/22 ?5638 02/02/22 ?7564  ?WBC 9.4 6.9 7.7  ?HGB 9.2* 9.0* 9.0*  ?HCT 32.4* 31.6* 31.7*  ?PLT 271 271 298  ? ? ?Recent Labs  ?Lab 01/29/22 ?1124 01/30/22 ?3329 01/31/22 ?5188 02/01/22 ?4166 02/02/22 ?0630  ?NA 144   < > 143 144 141  ?K 4.4   < > 3.4* 3.3* 4.0  ?CL 105   < > 102 103 101  ?CO2 31   < > 33* 32 31  ?BUN 14   < > 22 20 16   ?CREATININE 0.75   < > 0.77 0.48 0.54  ?CALCIUM 9.1   < > 8.5* 8.5* 8.9  ?PROT 6.3*  --   --   --   --   ?BILITOT 0.3  --   --   --   --   ?ALKPHOS 41  --   --   --   --   ?ALT 9  --   --   --   --   ?AST 14*  --   --   --   --   ?GLUCOSE 93   < > 105* 125* 124*  ? < > = values in this interval not displayed.  ? ? ?No results for  input(s): INR in the last 168 hours. ?  ? ?Imaging Studies: ?DG Chest 1 View ? ?Result Date: 01/31/2022 ?CLINICAL DATA:  Shortness of breath. EXAM: CHEST  1 VIEW COMPARISON:  Radiographs 01/29/2022. FINDINGS: 0828 hours. Stable cardiac enlargement with small bilateral pleural effusions  and bibasilar atelectasis. There is vascular congestion with possible mild edema. No evidence of pneumothorax. The bones appear unremarkable. Telemetry leads overlie the chest. IMPRESSION: Stable examination with cardiomegaly, bilateral pleural effusions and possible mild edema. Electronically Signed   By: Richardean Sale M.D.   On: 01/31/2022 09:47   ? ?Assessment:  ? ?# Symptomatic iron deficiency anemia ?# Acute respiratory failure with hypoxia- improving ?# DM2 ?# COPD ?# Anxiety and Depression ? ?Plan:  ?No signs of GIB. Hgb stable ?EGD and Colonoscopy planned for Monday with Dr. Marius Ditch pending patient not requiring supplemental O2 ?Clear liquid diet today. NPO at midnight ?Labs in am ?Golytely prep reordered for today ?Continue iv iron ?Pt continues diuresis and is clinically improving ?Potential video capsule endoscopy if EGD/Colonoscopy negative ? ?I personally performed the service. ? ?Management of other medical comorbidities as per primary team ? ?Thank you for allowing [place] to participate in this patient's care. Please don't hesitate to call if any questions or concerns arise.  ? ?Annamaria Helling, DO ?[facility] ? ?Portions of the record may have been created with voice recognition software. Occasional wrong-word or 'sound-a-like' substitutions may have occurred due to the inherent limitations of voice recognition software.  Read the chart carefully and recognize, using context, where substitutions may have occurred. ? ?

## 2022-02-03 ENCOUNTER — Inpatient Hospital Stay: Payer: Medicare Other | Admitting: Registered Nurse

## 2022-02-03 ENCOUNTER — Inpatient Hospital Stay: Payer: Medicare Other

## 2022-02-03 ENCOUNTER — Encounter: Payer: Self-pay | Admitting: Osteopathic Medicine

## 2022-02-03 ENCOUNTER — Encounter
Admission: EM | Disposition: A | Discharge status: Home / Self Care | Payer: Self-pay | Source: Home / Self Care | Attending: Family Medicine

## 2022-02-03 DIAGNOSIS — K269 Duodenal ulcer, unspecified as acute or chronic, without hemorrhage or perforation: Secondary | ICD-10-CM

## 2022-02-03 DIAGNOSIS — K6389 Other specified diseases of intestine: Secondary | ICD-10-CM | POA: Diagnosis not present

## 2022-02-03 DIAGNOSIS — K635 Polyp of colon: Secondary | ICD-10-CM

## 2022-02-03 DIAGNOSIS — K279 Peptic ulcer, site unspecified, unspecified as acute or chronic, without hemorrhage or perforation: Secondary | ICD-10-CM | POA: Diagnosis not present

## 2022-02-03 DIAGNOSIS — I509 Heart failure, unspecified: Secondary | ICD-10-CM | POA: Diagnosis not present

## 2022-02-03 DIAGNOSIS — J9601 Acute respiratory failure with hypoxia: Secondary | ICD-10-CM | POA: Diagnosis not present

## 2022-02-03 DIAGNOSIS — J449 Chronic obstructive pulmonary disease, unspecified: Secondary | ICD-10-CM | POA: Diagnosis not present

## 2022-02-03 DIAGNOSIS — F419 Anxiety disorder, unspecified: Secondary | ICD-10-CM | POA: Diagnosis not present

## 2022-02-03 DIAGNOSIS — K3189 Other diseases of stomach and duodenum: Secondary | ICD-10-CM

## 2022-02-03 HISTORY — PX: ESOPHAGOGASTRODUODENOSCOPY (EGD) WITH PROPOFOL: SHX5813

## 2022-02-03 HISTORY — PX: GIVENS CAPSULE STUDY: SHX5432

## 2022-02-03 HISTORY — PX: COLONOSCOPY WITH PROPOFOL: SHX5780

## 2022-02-03 LAB — BASIC METABOLIC PANEL
Anion gap: 8 (ref 5–15)
BUN: 17 mg/dL (ref 8–23)
CO2: 30 mmol/L (ref 22–32)
Calcium: 8.7 mg/dL — ABNORMAL LOW (ref 8.9–10.3)
Chloride: 101 mmol/L (ref 98–111)
Creatinine, Ser: 0.76 mg/dL (ref 0.44–1.00)
GFR, Estimated: >60 mL/min (ref >60)
Glucose, Bld: 122 mg/dL — ABNORMAL HIGH (ref 70–99)
Potassium: 4 mmol/L (ref 3.5–5.1)
Sodium: 139 mmol/L (ref 135–145)

## 2022-02-03 LAB — CBC
HCT: 31.1 % — ABNORMAL LOW (ref 36.0–46.0)
Hemoglobin: 9 g/dL — ABNORMAL LOW (ref 12.0–15.0)
MCH: 22.7 pg — ABNORMAL LOW (ref 26.0–34.0)
MCHC: 28.9 g/dL — ABNORMAL LOW (ref 30.0–36.0)
MCV: 78.3 fL — ABNORMAL LOW (ref 80.0–100.0)
Platelets: 274 10*3/uL (ref 150–400)
RBC: 3.97 MIL/uL (ref 3.87–5.11)
RDW: 16.9 % — ABNORMAL HIGH (ref 11.5–15.5)
WBC: 6.4 10*3/uL (ref 4.0–10.5)
nRBC: 0 % (ref 0.0–0.2)

## 2022-02-03 LAB — GLUCOSE, CAPILLARY
Glucose-Capillary: 129 mg/dL — ABNORMAL HIGH (ref 70–99)
Glucose-Capillary: 130 mg/dL — ABNORMAL HIGH (ref 70–99)
Glucose-Capillary: 210 mg/dL — ABNORMAL HIGH (ref 70–99)
Glucose-Capillary: 134 mg/dL — ABNORMAL HIGH (ref 70–99)

## 2022-02-03 SURGERY — ESOPHAGOGASTRODUODENOSCOPY (EGD) WITH PROPOFOL
Anesthesia: General

## 2022-02-03 MED ORDER — SODIUM CHLORIDE 0.9 % IV SOLN: INTRAVENOUS | Status: DC

## 2022-02-03 MED ORDER — FUROSEMIDE 40 MG PO TABS
40.0000 mg | ORAL_TABLET | Freq: Every day | ORAL | Status: DC
  Administered 2022-02-04: 40 mg via ORAL
  Filled 2022-02-03: qty 1

## 2022-02-03 MED ORDER — IOHEXOL 350 MG/ML SOLN
80.0000 mL | Freq: Once | INTRAVENOUS | Status: AC | PRN
  Administered 2022-02-03: 80 mL via INTRAVENOUS

## 2022-02-03 MED ORDER — FUROSEMIDE 10 MG/ML IJ SOLN
40.0000 mg | Freq: Once | INTRAMUSCULAR | Status: AC
  Administered 2022-02-03: 40 mg via INTRAVENOUS
  Filled 2022-02-03: qty 4

## 2022-02-03 MED ORDER — IOHEXOL 9 MG/ML PO SOLN
500.0000 mL | ORAL | Status: AC
  Administered 2022-02-03 (×2): 500 mL via ORAL

## 2022-02-03 MED ORDER — PROPOFOL 10 MG/ML IV BOLUS
INTRAVENOUS | Status: DC | PRN
Start: 2022-02-03 — End: 2022-02-03
  Administered 2022-02-03: 20 mg via INTRAVENOUS
  Administered 2022-02-03: 40 mg via INTRAVENOUS
  Administered 2022-02-03 (×3): 10 mg via INTRAVENOUS

## 2022-02-03 MED ORDER — IOHEXOL 240 MG/ML SOLN: 25.0000 mL | INTRAMUSCULAR | Status: AC

## 2022-02-03 MED ORDER — SPOT INK MARKER SYRINGE KIT
PACK | SUBMUCOSAL | Status: DC | PRN
  Administered 2022-02-03: 3 mL via SUBMUCOSAL

## 2022-02-03 MED ORDER — LIDOCAINE HCL (CARDIAC) PF 100 MG/5ML IV SOSY
PREFILLED_SYRINGE | INTRAVENOUS | Status: DC | PRN
Start: 2022-02-03 — End: 2022-02-03
  Administered 2022-02-03: 100 mg via INTRAVENOUS

## 2022-02-03 MED ORDER — PROPOFOL 500 MG/50ML IV EMUL
INTRAVENOUS | Status: DC | PRN
  Administered 2022-02-03: 150 ug/kg/min via INTRAVENOUS

## 2022-02-03 MED ORDER — SODIUM CHLORIDE 0.9 % IV SOLN
300.0000 mg | Freq: Once | INTRAVENOUS | Status: AC
  Administered 2022-02-03: 300 mg via INTRAVENOUS
  Filled 2022-02-03: qty 300

## 2022-02-03 NOTE — Progress Notes (Signed)
Patient noted to achieve 1500 on incentive spirometer, however, while off O2 sats dropped to 74% and again to 84%. The 1L is sustaining the sats at 94%. Stool is a clear brown at this time. NPO since MN and did not consume the entire dose of the bowel prep. Safety maintained. Bedside commode at bedside, call bell kept within reach. ?

## 2022-02-03 NOTE — Assessment & Plan Note (Signed)
No recent bleeding.  ?- PPI ?- Avoid NSAIDs, etc.  ?

## 2022-02-03 NOTE — Transfer of Care (Signed)
Immediate Anesthesia Transfer of Care Note ? ?Patient: Jessica Stout ? ?Procedure(s) Performed: ESOPHAGOGASTRODUODENOSCOPY (EGD) WITH PROPOFOL ?GIVENS CAPSULE STUDY ?COLONOSCOPY WITH PROPOFOL ? ?Patient Location: Endoscopy Unit ? ?Anesthesia Type:General ? ?Level of Consciousness: drowsy ? ?Airway & Oxygen Therapy: Patient Spontanous Breathing and Patient connected to nasal cannula oxygen ? ?Post-op Assessment: Report given to RN and Post -op Vital signs reviewed and stable ? ?Post vital signs: Reviewed and stable ? ?Last Vitals:  ?Vitals Value Taken Time  ?BP 92/51 02/03/22 1012  ?Temp    ?Pulse 70 02/03/22 1013  ?Resp 13 02/03/22 1013  ?SpO2 99 % 02/03/22 1013  ?Vitals shown include unvalidated device data. ? ?Last Pain:  ?Vitals:  ? 02/03/22 0822  ?TempSrc:   ?PainSc: 0-No pain  ?   ? ?Patients Stated Pain Goal: 0 (02/02/22 2059) ? ?Complications: No notable events documented. ?

## 2022-02-03 NOTE — Op Note (Signed)
[facility] ?Gastroenterology ?Patient Name: Jessica Stout ?Procedure Date: 02/03/2022 8:53 AM ?MRN: [id number] ?Account #: [id number] ?Date of Birth: 06/29/1951 ?Admit Type: Inpatient ?Age: 71 ?Room: endo 1 ?Gender: Female ?Note Status: Finalized ?Instrument Name: Peds Colonoscope [id number] ?Procedure:             Colonoscopy ?Indications:           Unexplained iron deficiency anemia ?Providers:             Lin Landsman MD, MD ?Referring MD:          Forest Gleason Md, MD (Referring MD) ?Medicines:             General Anesthesia ?Complications:         No immediate complications. Estimated blood loss:  ?                       Minimal. ?Procedure:             Pre-Anesthesia Assessment: ?                       - Prior to the procedure, a History and Physical was  ?                       performed, and patient medications and allergies were  ?                       reviewed. The patient is competent. The risks and  ?                       benefits of the procedure and the sedation options and  ?                       risks were discussed with the patient. All questions  ?                       were answered and informed consent was obtained.  ?                       Patient identification and proposed procedure were  ?                       verified by the physician, the nurse, the  ?                       anesthesiologist, the anesthetist and the technician  ?                       in the pre-procedure area in the procedure room in the  ?                       endoscopy suite. Mental Status Examination: alert and  ?                       oriented. Airway Examination: normal oropharyngeal  ?                       airway and neck mobility. Respiratory Examination:  ?  clear to auscultation. CV Examination: normal.  ?                       Prophylactic Antibiotics: The patient does not require  ?                       prophylactic antibiotics. Prior Anticoagulants: The  ?                        patient has taken no previous anticoagulant or  ?                       antiplatelet agents. ASA Grade Assessment: III - A  ?                       patient with severe systemic disease. After reviewing  ?                       the risks and benefits, the patient was deemed in  ?                       satisfactory condition to undergo the procedure. The  ?                       anesthesia plan was to use general anesthesia.  ?                       Immediately prior to administration of medications,  ?                       the patient was re-assessed for adequacy to receive  ?                       sedatives. The heart rate, respiratory rate, oxygen  ?                       saturations, blood pressure, adequacy of pulmonary  ?                       ventilation, and response to care were monitored  ?                       throughout the procedure. The physical status of the  ?                       patient was re-assessed after the procedure. ?                       After obtaining informed consent, the colonoscope was  ?                       passed under direct vision. Throughout the procedure,  ?                       the patient's blood pressure, pulse, and oxygen  ?                       saturations were monitored continuously. The  ?  Colonoscope was introduced through the anus and  ?                       advanced to the the transverse colon to examine a  ?                       mass. This was the intended extent. The colonoscopy  ?                       was performed with moderate difficulty due to a  ?                       partially obstructing mass. The patient tolerated the  ?                       procedure well. The quality of the bowel preparation  ?                       was fair. ?Findings: ?     The perianal and digital rectal examinations were normal. Pertinent  ?     negatives include normal sphincter tone and no palpable rectal lesions. ?     A frond-like/villous and  infiltrative partially obstructing large mass  ?     was found at 70 cm proximal to the anus. The mass was partially  ?     circumferential. Oozing was present. Biopsies were taken with a cold  ?     forceps for histology. Estimated blood loss was minimal. Area was  ?     tattooed with an injection of Spot (carbon black). ?     A 10 mm polyp was found in the descending colon. The polyp was sessile.  ?     The polyp was removed with a hot snare. Resection and retrieval were  ?     complete. Estimated blood loss: none. ?     Four sessile polyps were found in the sigmoid colon. The polyps were 5  ?     to 7 mm in size. These polyps were removed with a cold snare. Resection  ?     and retrieval were complete. Estimated blood loss: none. ?     A large polypoid lesion was found in the distal rectum encroaching on to  ?     the dentate line and external hemorrhoids. The lesion was sessile. No  ?     bleeding was present. Biopsies were taken with a cold forceps for  ?     histology. Estimated blood loss was minimal. ?     Non-bleeding external and internal hemorrhoids were found during  ?     retroflexion. The hemorrhoids were medium-sized. ?     Multiple small and large-mouthed diverticula were found in the entire  ?     colon. There was no evidence of diverticular bleeding. ?Impression:            - Preparation of the colon was fair. ?                       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                       cm proximal to the anus. Biopsied.  Tattooed. ?                       - One 10 mm polyp in the descending colon, removed  ?                       with a hot snare. Resected and retrieved. ?                       - Four 5 to 7 mm polyps in the sigmoid colon, removed  ?                       with a cold snare. Resected and retrieved. ?                       - Polypoid lesion in the distal rectum. Biopsied. ?                       - Non-bleeding external and internal hemorrhoids. ?                        - Severe diverticulosis in the entire examined colon.  ?                       There was no evidence of diverticular bleeding. ?Recommendation:        - Return patient to hospital ward for ongoing care. ?                       - Cardiac diet today. ?                       - Continue present medications. ?                       - Await pathology results. ?                       - Refer to an oncologist at appointment to be  ?                       scheduled. ?Procedure Code(s):     --- Professional --- ?                       [phone], 52, Colonoscopy, flexible; with removal of  ?                       tumor(s), polyp(s), or other lesion(s) by snare  ?                       technique ?                       71696, 52, Colonoscopy, flexible; with directed  ?                       submucosal injection(s), any substance ?                       [id number], 59,52, Colonoscopy, flexible; with biopsy,  ?  single or multiple ?Diagnosis Code(s):     --- Professional --- ?                       C16.6, Other hemorrhoids ?                       D49.0, Neoplasm of unspecified behavior of digestive  ?                       system ?                       K56.690, Other partial intestinal obstruction ?                       K63.5, Polyp of colon ?                       D50.9, Iron deficiency anemia, unspecified ?CPT copyright 2019 [employer] Medical Association. All rights reserved. ?The codes documented in this report are preliminary and upon coder review may  ?be revised to meet current compliance requirements. ?Dr. Ulyess Mort ?Tyese Finken Raeanne Gathers MD, MD ?02/03/2022 10:11:34 AM ?This report has been signed electronically. ?Number of Addenda: 0 ?Note Initiated On: 02/03/2022 8:53 AM ?Scope Withdrawal Time: 0 hours 26 minutes 31 seconds  ?Total Procedure Duration: 0 hours 33 minutes 37 seconds  ?Estimated Blood Loss:  Estimated blood loss was minimal. ?     [facility] ?

## 2022-02-03 NOTE — Anesthesia Preprocedure Evaluation (Signed)
Anesthesia Evaluation  ?Patient identified by MRN, date of birth, ID band ?Patient awake ? ? ? ?Reviewed: ?Allergy & Precautions, H&P , NPO status , Patient's Chart, lab work & pertinent test results, reviewed documented beta blocker date and time  ? ?Airway ?Mallampati: II ? ? ?Neck ROM: full ? ? ? Dental ? ?(+) Poor Dentition ?  ?Pulmonary ?COPD,  COPD inhaler, former smoker,  ?  ?Pulmonary exam normal ? ? ? ? ? ? ? Cardiovascular ?Exercise Tolerance: Poor ?+CHF  ?Normal cardiovascular exam ?Rhythm:regular Rate:Normal ? ? ?  ?Neuro/Psych ?PSYCHIATRIC DISORDERS Anxiety Depression negative neurological ROS ?   ? GI/Hepatic ?negative GI ROS, Neg liver ROS,   ?Endo/Other  ?diabetes, Well Controlled, Type 1, Insulin Dependent ? Renal/GU ?negative Renal ROS  ?negative genitourinary ?  ?Musculoskeletal ? ? Abdominal ?  ?Peds ? Hematology ? ?(+) Blood dyscrasia, anemia ,   ?Anesthesia Other Findings ?History reviewed. No pertinent past medical history. ?History reviewed. No pertinent surgical history. ?BMI   ? Body Mass Index: 25.04 kg/m?  ?  ? Reproductive/Obstetrics ?negative OB ROS ? ?  ? ? ? ? ? ? ? ? ? ? ? ? ? ?  ?  ? ? ? ? ? ? ? ? ?Anesthesia Physical ?Anesthesia Plan ? ?ASA: 3 ? ?Anesthesia Plan: General  ? ?Post-op Pain Management:   ? ?Induction:  ? ?PONV Risk Score and Plan:  ? ?Airway Management Planned:  ? ?Additional Equipment:  ? ?Intra-op Plan:  ? ?Post-operative Plan:  ? ?Informed Consent: I have reviewed the patients History and Physical, chart, labs and discussed the procedure including the risks, benefits and alternatives for the proposed anesthesia with the patient or authorized representative who has indicated his/her understanding and acceptance.  ? ? ? ?Dental Advisory Given ? ?Plan Discussed with: CRNA ? ?Anesthesia Plan Comments:   ? ? ? ? ? ? ?Anesthesia Quick Evaluation ? ?

## 2022-02-03 NOTE — Progress Notes (Signed)
?Progress Note ? ?Patient: Jessica Stout YKZ:[id number] DOB: 1951/06/30  ?DOA: 01/29/2022  DOS: 02/03/2022  ?  ?Brief hospital course: ?Ameya Vowell is a 71 y.o. female with a history of T2DM, HTN, HLD, anxiety/depression who presented to the ED 3/1 with several weeks of dyspnea with exertion, worsening to dyspnea at rest associated with leg swelling and severe, progressive fatigue. She had been evaluated by cardiology recently, echocardiogram ordered and pending. GI referral was made due to significant anemia (hgb 9.3g/dl from previous 14g/dl), and an episode of pink stool. In the ED she was afebrile, newly hypoxemic, with sinus tachycardia. CXR with cardiomegaly, pulmonary edema, BNP recently >2000. IV lasix given with some improvement, GDMT initiated with cardiology consultation for LVEF 45-50%. With improved cardiorespiratory status, plan was to undergo colonoscopy, EGD +/- VCE 3/4. She was sent down with supplemental oxygen (though not noted to be hypoxic), so anesthesia deferred work up. Diuresis has continued, hypoxia improved. Colonoscopy and EGD pursued 3/6 with results showing duodenal ulcer, erosive gastropathy, and colon mass. ? ?Assessment and Plan: ?* New onset of HFrEF ?LVEF 45-50%, mild-mod MR, IVC normal. ?- Continue lasix 40mg  IV BID again today, renal function robust.  ?- ARB, BB ordered for GDMT. ?- Seen at [facility], appreciate recommendations. ? ?Colonic mass ?Partially obstructing.  ?- Follow up pathology results.  ?- Oncology consulted for further recommendations.  ?- Will check CT abd/pelvis and CEA. ? ?Erosive gastropathy ?No recent bleeding.  ?- PPI ?- Avoid NSAIDs, etc.  ? 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  ?- Follow up biopsy from EGD 3/6. Continue PPI 40mg  po BID x3 months, then daily indefinitely. ?- Follow up with GI, Dr. Marius Ditch in ~1 month to arrange repeat EGD. ? ?Hypokalemia ?K is 4.0 now. ? ?Iron deficiency  anemia ?Microcytic and trending down past few months, possible GI bleed vs. nutritional deficiency. Iron stores low, high TIBC, 5% sat. Bilirubin wnl. ?- IV iron given 3/3, repeating 3/6 per GI, will discharge on oral supplementation. ?- Hgb stabilized at 9g/dl. ?- With no active bleeding seen on endoscopic evaluations and current suspicion for malignancy, will initiate prophylactic VTE ppx. ?- PPI will be continued per GI rec's ? ?Acute respiratory failure with hypoxia ([place]) ?Due to new diagnosis CHF with pulmonary edema. Has baseline COPD, though no wheezing to suggest exacerbation, just lowers pulmonary reserve. ?- Remains reportedly hypoxemic (out of proportion to subjective dyspnea) despite appearing euvolemic. In the setting of likely malignancy and needing to avoid pharmacologic VTE ppx, we will have to r/o PE with CTA chest. ? ?Anxiety and depression ?Quiescent.  ?- Continue home effexor ? ?Hyperlipidemia ?Pt not taking rosuvastatin. No definitive PAD, CAD, though with DM, would qualify for statin regardless. LDL is 48. Will discuss with patient. ? ?Chronic obstructive pulmonary disease ([place]) ?No home controlled medications. No evidence of exacerbation.  ?- prn duoneb. ? ?Diabetes mellitus type 2, controlled, without complications ([place]) ?HbA1c 6.5%.  ?- Hold home sitaglipitin, metformin ?- Covering with novolog 3u TIDWC + mod SSI. Remaining at inpatient goal without hypoglycemia. ? ? ?Subjective: Feels a bit out of it since the procedure but very hungry and excited to eat. No bleeding noted. Feels very fatigued but denies shortness of breath or chest pain. No new leg swelling, in fact this has resolved with diuresis.  ? ?Objective: ?Vitals:  ? 02/03/22 1030 02/03/22 1040 02/03/22 1050 02/03/22 1127  ?BP: 125/90 135/67 135/67 (!) 135/59  ?Pulse: 77 67 72 78  ?  Resp: (!) 22 16 18 18   ?Temp:    97.6 ?F (36.4 ?C)  ?TempSrc:    Oral  ?SpO2: 100% 100% 100% 95%  ?Weight:      ?Height:      ? ?Gen: 71 y.o. female in  no distress ?Pulm: Nonlabored breathing supplemental oxygen. Clear. ?CV: Regular rate and rhythm. No murmur, rub, or gallop. No JVD, no dependent edema. ?GI: Abdomen soft, non-tender, non-distended, with normoactive bowel sounds.  ?Ext: Warm, no deformities ?Skin: No rashes, lesions or ulcers on visualized skin. ?Neuro: Alert and oriented. No focal neurological deficits. ?Psych: Judgement and insight appear fair. Mood euthymic & affect congruent. Behavior is appropriate.   ? ?Data Personally reviewed: ?Hgb 9.3 > 9.2 > 9 > 9, microcytic indices, ferritin 11, iron 27, TIBC 545, 5% sat, B12 558, folate 12.9. ?Na 146 > 143 ?SCr 0.75 > 0.93 > 0.77 > 0.48 > 0.54 ?LFTs wnl, TBili 0.3.  ?K 3.3 ?LDL 48, HDL 36.  ?Glucose remains at inpatient goal.  ? ?CXR (personal review): Cardiomegaly with vascular congestion centrally, suggestion of effusions.  ?CTA chest, CT abd/pelvis 3/6: Pending. ?   ?SARS-CoV-2 PCR (3/1): Negative ?Influenza A/B: Negative ? ?EGD 02/03/2022 Dr. Marius Ditch:  ?Impression:    - Non-bleeding duodenal ulcers with a clean ulcer base  ?                       (Forrest Class III). Biopsied. ?                       - Erosive gastropathy with no bleeding and no stigmata  ?                       of recent bleeding. Biopsied. ?                       - Small hiatal hernia. ?                       - Normal gastric body. Biopsied. ?                       - Normal gastroesophageal junction and esophagus. ?Recommendation: - Await pathology results. ?                       - Follow an antireflux regimen indefinitely. ?                       - Use Protonix (pantoprazole) 40 mg PO BID for 3  ?                       months. ?                       - Return to my office in 1 month. ?                       - Proceed with colonoscopy as scheduled ?                       See colonoscopy report ? ?Colonoscopy 02/03/2022 Dr. Marius Ditch:  ?Impression:    - Preparation of the colon was fair. ?                       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                       cm proximal to the anus. Biopsied. Tattooed. ?                       - One 10 mm polyp in the descending colon, removed  ?                       with a hot snare. Resected and retrieved. ?                       - Four 5 to 7 mm polyps in the sigmoid colon, removed  ?                       with a cold snare. Resected and retrieved. ?                       - Polypoid lesion in the distal rectum. Biopsied. ?                       - Non-bleeding external and internal hemorrhoids. ?                       - Severe diverticulosis in the entire examined colon.  ?                       There was no evidence of diverticular bleeding. ?Recommendation: - Return patient to hospital ward for ongoing care. ?                       - Cardiac diet today. ?                       - Continue present medications. ?                       - Await pathology results. ?                       - Refer to an oncologist at appointment to be  ?                       scheduled. ? ? ? ?Family Communication:  None at bedside ? ?Disposition: ?Status is: Inpatient ?Remains inpatient appropriate because: Ongoing IV diuresis, GI bleed work up ?Planned Discharge Destination: Home ? ?Patrecia Pour, MD ?02/03/2022 12:01 PM ?Page by [email or web address]  ?

## 2022-02-03 NOTE — Consult Note (Signed)
[place]  Telephone:(336) [phone] Fax:(336) [phone]  ID: Jessica Stout OB: [birth year]  MR#: [id number]  HYW#:[id number]  Patient Care Team: Patient, No Pcp Per (Inactive) as PCP - General (General Practice)  CHIEF COMPLAINT: Colon mass highly suspicious for underlying malignancy.  INTERVAL HISTORY: Patient is a 71 year old female who noticed increasing shortness of breath over the past several weeks.  Over that timeframe she noticed coughing up some pinkish material as well as one possible episode of blood in her stool.  She also recently had a full evaluation by cardiology.  Lab work revealed a significant anemia and subsequent EGD and colonoscopy revealed a colon mass highly suspicious for underlying malignancy.  Currently, patient feels improved since admission.  She has no neurologic complaints.  She denies any recent fevers or illnesses.  She has a good appetite and denies weight loss.  She has no chest pain, cough, or hemoptysis.  She has no nausea, vomiting, constipation, or diarrhea.  She has no further melena or hematochezia.  She has no urinary complaints.  Patient offers no further specific complaints today.  REVIEW OF SYSTEMS:   Review of Systems  Constitutional:  Positive for malaise/fatigue. Negative for fever and weight loss.  Respiratory:  Positive for shortness of breath. Negative for cough.   Cardiovascular: Negative.  Negative for chest pain and leg swelling.  Gastrointestinal:  Positive for blood in stool. Negative for abdominal pain and melena.  Genitourinary: Negative.  Negative for hematuria.  Musculoskeletal: Negative.  Negative for back pain.  Skin: Negative.  Negative for rash.  Neurological:  Positive for weakness. Negative for dizziness, focal weakness and headaches.  Psychiatric/Behavioral: Negative.  The patient is not nervous/anxious.    As per HPI. Otherwise, a complete review of systems is negative.  PAST MEDICAL HISTORY: History  reviewed. No pertinent past medical history.  PAST SURGICAL HISTORY: History reviewed. No pertinent surgical history.  FAMILY HISTORY: History reviewed. No pertinent family history.  ADVANCED DIRECTIVES (Y/N):  @ADVDIR @  HEALTH MAINTENANCE: Social History   Tobacco Use   Smoking status: Former    Types: Cigarettes    Quit date: 01/01/2022    Years since quitting: 0.0     Colonoscopy:  PAP:  Bone density:  Lipid panel:  Allergies  Allergen Reactions   Codeine Rash    Happened in 1970s    Current Facility-Administered Medications  Medication Dose Route Frequency Provider Last Rate Last Admin   0.9 %  sodium chloride infusion  250 mL Intravenous PRN Emeterio Reeve, DO       acetaminophen (TYLENOL) tablet 650 mg  650 mg Oral Q4H PRN Emeterio Reeve, DO   650 mg at 02/02/22 2109   [START ON 02/04/2022] furosemide (LASIX) tablet 40 mg  40 mg Oral Daily Tang, Alanson Puls, PA-C       insulin aspart (novoLOG) injection 0-15 Units  0-15 Units Subcutaneous TID WC Emeterio Reeve, DO   2 Units at 02/01/22 1204   insulin aspart (novoLOG) injection 0-5 Units  0-5 Units Subcutaneous QHS Emeterio Reeve, DO       insulin aspart (novoLOG) injection 3 Units  3 Units Subcutaneous TID WC Emeterio Reeve, DO   3 Units at 02/02/22 0814   ipratropium-albuterol (DUONEB) 0.5-2.5 (3) MG/3ML nebulizer solution 3 mL  3 mL Nebulization Q4H PRN Emeterio Reeve, DO       iron sucrose (VENOFER) 300 mg in sodium chloride 0.9 % 250 mL IVPB  300 mg Intravenous Once Lin Landsman, MD  losartan (COZAAR) tablet 25 mg  25 mg Oral Daily Emeterio Reeve, DO   25 mg at 02/02/22 0804   magnesium oxide (MAG-OX) tablet 400 mg  400 mg Oral BID Tristan Schroeder, PA-C   400 mg at 02/02/22 2109   melatonin tablet 2.5 mg  2.5 mg Oral QHS Patrecia Pour, MD   2.5 mg at 02/02/22 2109   metoprolol succinate (TOPROL-XL) 24 hr tablet 25 mg  25 mg Oral Daily Tristan Schroeder, PA-C   25 mg at  02/02/22 0804   ondansetron [facility]) injection 4 mg  4 mg Intravenous Q6H PRN Emeterio Reeve, DO   4 mg at 02/03/22 2202   polyethylene glycol-electrolytes (NuLYTELY) solution 2,000 mL  2,000 mL Oral Once PRN Annamaria Helling, DO       sodium chloride flush (NS) 0.9 % injection 3 mL  3 mL Intravenous Q12H Emeterio Reeve, DO   3 mL at 02/03/22 0825   sodium chloride flush (NS) 0.9 % injection 3 mL  3 mL Intravenous PRN Emeterio Reeve, DO       venlafaxine XR (EFFEXOR-XR) 24 hr capsule 75 mg  75 mg Oral Q breakfast Emeterio Reeve, DO   75 mg at 02/02/22 0804    OBJECTIVE: Vitals:   02/03/22 1050 02/03/22 1127  BP: 135/67 (!) 135/59  Pulse: 72 78  Resp: 18 18  Temp:  97.6 F (36.4 C)  SpO2: 100% 95%     Body mass index is 25.04 kg/m.    ECOG FS:1 - Symptomatic but completely ambulatory  General: Well-developed, well-nourished, no acute distress. Eyes: Pink conjunctiva, anicteric sclera. HEENT: Normocephalic, moist mucous membranes. Lungs: No audible wheezing or coughing. Heart: Regular rate and rhythm. Abdomen: Soft, nontender, no obvious distention. Musculoskeletal: No edema, cyanosis, or clubbing. Neuro: Alert, answering all questions appropriately. Cranial nerves grossly intact. Skin: No rashes or petechiae noted. Psych: Normal affect. Lymphatics: No cervical, calvicular, axillary or inguinal LAD.   LAB RESULTS:  Lab Results  Component Value Date   NA 139 02/03/2022   K 4.0 02/03/2022   CL 101 02/03/2022   CO2 30 02/03/2022   GLUCOSE 122 (H) 02/03/2022   BUN 17 02/03/2022   CREATININE 0.76 02/03/2022   CALCIUM 8.7 (L) 02/03/2022   PROT 6.3 (L) 01/29/2022   ALBUMIN 3.0 (L) 01/29/2022   AST 14 (L) 01/29/2022   ALT 9 01/29/2022   ALKPHOS 41 01/29/2022   BILITOT 0.3 01/29/2022   GFRNONAA >60 02/03/2022    Lab Results  Component Value Date   WBC 6.4 02/03/2022   HGB 9.0 (L) 02/03/2022   HCT 31.1 (L) 02/03/2022   MCV 78.3 (L) 02/03/2022   PLT  274 02/03/2022     STUDIES: DG Chest 1 View  Result Date: 01/31/2022 CLINICAL DATA:  Shortness of breath. EXAM: CHEST  1 VIEW COMPARISON:  Radiographs 01/29/2022. FINDINGS: 0828 hours. Stable cardiac enlargement with small bilateral pleural effusions and bibasilar atelectasis. There is vascular congestion with possible mild edema. No evidence of pneumothorax. The bones appear unremarkable. Telemetry leads overlie the chest. IMPRESSION: Stable examination with cardiomegaly, bilateral pleural effusions and possible mild edema. Electronically Signed   By: Richardean Sale M.D.   On: 01/31/2022 09:47   DG Chest 2 View  Result Date: 01/29/2022 CLINICAL DATA:  Shortness of breath. EXAM: CHEST - 2 VIEW COMPARISON:  None. FINDINGS: Mild cardiomegaly is noted. Mild central pulmonary vascular congestion is noted with probable bibasilar pulmonary edema and pleural effusions with associated atelectasis.  Bony thorax is unremarkable. IMPRESSION: Mild cardiomegaly with central pulmonary vascular congestion and probable bilateral pulmonary edema and atelectasis and pleural effusions. Electronically Signed   By: Marijo Conception M.D.   On: 01/29/2022 11:58   ECHOCARDIOGRAM COMPLETE  Result Date: 01/30/2022    ECHOCARDIOGRAM REPORT   Patient Name:   KAMBRE MESSNER Date of Exam: 01/30/2022 Medical Rec #:  [id number]   Height:       64.0 in Accession #:    [id number]  Weight:       159.0 lb Date of Birth:  19-Dec-1950   BSA:          1.775 m Patient Age:    66 years    BP:           141/72 mmHg Patient Gender: F           HR:           58 bpm. Exam Location:  [facility] Procedure: 2D Echo, Cardiac Doppler and Color Doppler Indications:     CHF I50.9  History:         Patient has no prior history of Echocardiogram examinations. No                  past medical history on file.  Sonographer:     Sherrie Sport Referring Phys:  [id number] Emeterio Reeve Diagnosing Phys: Isaias Cowman MD  Sonographer Comments: Suboptimal apical window.  IMPRESSIONS  1. Left ventricular ejection fraction, by estimation, is 45 to 50%. The left ventricle has mildly decreased function. The left ventricle has no regional wall motion abnormalities. There is mild left ventricular hypertrophy. Left ventricular diastolic parameters are indeterminate.  2. Right ventricular systolic function is normal. The right ventricular size is normal.  3. The mitral valve is normal in structure. Mild to moderate mitral valve regurgitation. No evidence of mitral stenosis.  4. The aortic valve is normal in structure. Aortic valve regurgitation is not visualized. No aortic stenosis is present.  5. The inferior vena cava is normal in size with greater than 50% respiratory variability, suggesting right atrial pressure of 3 mmHg. FINDINGS  Left Ventricle: Left ventricular ejection fraction, by estimation, is 45 to 50%. The left ventricle has mildly decreased function. The left ventricle has no regional wall motion abnormalities. The left ventricular internal cavity size was normal in size. There is mild left ventricular hypertrophy. Left ventricular diastolic parameters are indeterminate. Right Ventricle: The right ventricular size is normal. No increase in right ventricular wall thickness. Right ventricular systolic function is normal. Left Atrium: Left atrial size was normal in size. Right Atrium: Right atrial size was normal in size. Pericardium: There is no evidence of pericardial effusion. Mitral Valve: The mitral valve is normal in structure. Mild to moderate mitral valve regurgitation. No evidence of mitral valve stenosis. MV peak gradient, 6.7 mmHg. The mean mitral valve gradient is 2.0 mmHg. Tricuspid Valve: The tricuspid valve is normal in structure. Tricuspid valve regurgitation is mild . No evidence of tricuspid stenosis. Aortic Valve: The aortic valve is normal in structure. Aortic valve regurgitation is not visualized. No aortic stenosis is present. Aortic valve mean gradient  measures 1.0 mmHg. Aortic valve peak gradient measures 2.2 mmHg. Aortic valve area, by VTI measures 2.46 cm. Pulmonic Valve: The pulmonic valve was normal in structure. Pulmonic valve regurgitation is not visualized. No evidence of pulmonic stenosis. Aorta: The aortic root is normal in size and structure. Venous: The inferior vena cava is normal in  size with greater than 50% respiratory variability, suggesting right atrial pressure of 3 mmHg. IAS/Shunts: No atrial level shunt detected by color flow Doppler.  LEFT VENTRICLE PLAX 2D LVIDd:         5.10 cm   Diastology LVIDs:         3.90 cm   LV e' medial:    3.26 cm/s LV PW:         1.40 cm   LV E/e' medial:  19.4 LV IVS:        1.20 cm   LV e' lateral:   4.68 cm/s LVOT diam:     2.00 cm   LV E/e' lateral: 13.5 LV SV:         28 LV SV Index:   16 LVOT Area:     3.14 cm  LEFT ATRIUM              Index        RIGHT ATRIUM           Index LA diam:        4.60 cm  2.59 cm/m   RA Area:     22.60 cm LA Vol (A2C):   87.2 ml  49.14 ml/m  RA Volume:   75.90 ml  42.77 ml/m LA Vol (A4C):   111.0 ml 62.55 ml/m LA Biplane Vol: 99.9 ml  56.29 ml/m  AORTIC VALVE                    PULMONIC VALVE AV Area (Vmax):    2.17 cm     PV Vmax:        0.85 m/s AV Area (Vmean):   2.06 cm     PV Vmean:       55.700 cm/s AV Area (VTI):     2.46 cm     PV VTI:         0.107 m AV Vmax:           74.60 cm/s   PV Peak grad:   2.9 mmHg AV Vmean:          52.350 cm/s  PV Mean grad:   1.0 mmHg AV VTI:            0.114 m      RVOT Peak grad: 1 mmHg AV Peak Grad:      2.2 mmHg AV Mean Grad:      1.0 mmHg LVOT Vmax:         51.60 cm/s LVOT Vmean:        34.300 cm/s LVOT VTI:          0.089 m LVOT/AV VTI ratio: 0.78  AORTA Ao Root diam: 3.05 cm MITRAL VALVE MV Area (PHT): 6.43 cm     SHUNTS MV Area VTI:   1.45 cm     Systemic VTI:  0.09 m MV Peak grad:  6.7 mmHg     Systemic Diam: 2.00 cm MV Mean grad:  2.0 mmHg     Pulmonic VTI:  0.067 m MV Vmax:       1.29 m/s MV Vmean:      65.2 cm/s MV  Decel Time: 118 msec MV E velocity: 63.40 cm/s MV A velocity: 112.00 cm/s MV E/A ratio:  0.57 Isaias Cowman MD Electronically signed by Isaias Cowman MD Signature Date/Time: 01/30/2022/4:37:47 PM    Final     ASSESSMENT: Colon mass highly suspicious for underlying malignancy.  PLAN:  1.  Colon mass: Highly suspicious for underlying malignancy.  Pathology is pending at time of dictation.  CEA as well as imaging with CT of chest, abdomen, and pelvis are also pending at time of dictation.  Patient will likely need surgical consult for consideration of resection if pathology is positive for malignancy.  No intervention is needed from an oncology standpoint at this time.  If biopsy positive, recommend patient follow-up in the cancer center in 1 to 2 weeks after her surgical resection.  If biopsy negative, no follow-up is necessary. 2.  Anemia: Likely secondary to possible underlying malignancy.  Hemoglobin has remained stable since admission.  Monitor.  Appreciate consult, will follow.   Lloyd Huger, MD   02/03/2022 2:48 PM

## 2022-02-03 NOTE — Assessment & Plan Note (Signed)
Jessica Stout.  ?- Follow up biopsy from EGD 3/6. Continue PPI 40mg  po BID x3 months, then daily indefinitely. ?- Follow up with GI, Dr. Marius Ditch in ~1 month to arrange repeat EGD. ?

## 2022-02-03 NOTE — Care Management Important Message (Signed)
Important Message ? ?Patient Details  ?Name: Jessica Stout ?MRN: [id number] ?Date of Birth: 1951/04/01 ? ? ?Medicare Important Message Given:  Yes ? ? ? ? ?Dannette Barbara ?02/03/2022, 12:06 PM ?

## 2022-02-03 NOTE — Progress Notes (Signed)
[facility] NOTE       Patient ID: Jessica Stout MRN: [id number] DOB/AGE: Apr 14, 1951 71 y.o.  Admit date: 01/29/2022 Referring Physician Dr. Vance Gather Primary Physician Dr. Hall Busing Primary Cardiologist Dr. Donnelly Angelica Reason for Consultation new onset CHF  HPI: The patient is a 71 year old female with a past medical history notable for type 2 diabetes, hypertension, sinus tachycardia with frequent PACs, asthma, colon polyps s/p colon resection (3 feet of colon resected 2002) who presented to [facility] ED 01/29/2022 with worsening shortness of breath. Echo 3/2 resulted with LVEF 45-50% and mildly decreased function, mi-mod MR.  Interval History:  -admits to some nausea after drinking half of the bowel prep for planned EGD/colonoscopy this morning -denies SOB, chest pain, palpitations, LE swelling. -weaning to room air this morning and O2sat around 90   Review of systems complete and found to be negative unless listed above   Medications Prior to Admission  Medication Sig Dispense Refill Last Dose   acetaminophen (TYLENOL) 325 MG tablet Take 650 mg by mouth every 6 (six) hours as needed.   Past Week   albuterol (VENTOLIN HFA) 108 (90 Base) MCG/ACT inhaler Inhale into the lungs.   Past Week   atorvastatin (LIPITOR) 40 MG tablet Take 40 mg by mouth daily.   01/28/2022   fenofibrate 54 MG tablet Take 54 mg by mouth daily.   01/28/2022   furosemide (LASIX) 40 MG tablet Take 40 mg by mouth daily.   01/28/2022   gabapentin (NEURONTIN) 600 MG tablet Take by mouth.   01/28/2022   JANUMET 50-1000 MG tablet Take 1 tablet by mouth daily.   01/28/2022   LANTUS SOLOSTAR 100 UNIT/ML Solostar Pen Inject 10 Units into the skin at bedtime.   01/28/2022   losartan (COZAAR) 25 MG tablet Take 25 mg by mouth daily.   01/28/2022   losartan-hydrochlorothiazide (HYZAAR) 100-12.5 MG tablet Take 1 tablet by mouth daily.   01/28/2022   meloxicam (MOBIC) 7.5 MG tablet Take 7.5 mg by mouth daily. (Patient not  taking: Reported on 01/30/2022)   Not Taking   rosuvastatin (CRESTOR) 20 MG tablet Crestor 20 mg tablet (Patient not taking: Reported on 01/30/2022)   Not Taking   traMADol (ULTRAM) 50 MG tablet Take 50 mg by mouth 4 (four) times daily as needed.   01/28/2022   venlafaxine XR (EFFEXOR-XR) 75 MG 24 hr capsule Take 75 mg by mouth daily.   01/28/2022   Social History   Socioeconomic History   Marital status: Unknown    Spouse name: Not on file   Number of children: Not on file   Years of education: Not on file   Highest education level: Not on file  Occupational History   Not on file  Tobacco Use   Smoking status: Never   Smokeless tobacco: Not on file  Substance and Sexual Activity   Alcohol use: Not on file   Drug use: Not on file   Sexual activity: Not on file  Other Topics Concern   Not on file  Social History Narrative   Not on file   Social Determinants of Health   Financial Resource Strain: Not on file  Food Insecurity: Not on file  Transportation Needs: Not on file  Physical Activity: Not on file  Stress: Not on file  Social Connections: Not on file  Intimate Partner Violence: Not on file    History reviewed. No pertinent family history.    Review of systems complete and found to  be negative unless listed above    PHYSICAL EXAM General: Elderly Caucasian female, well nourished, in no acute distress.  Sitting upright in PCU bed. HEENT:  Normocephalic and atraumatic. Neck:  No JVD.  Lungs: normal respiratory effort on by nasal cannula.  Clear to ascultation bilaterally  heart: HRRR . Normal S1 and S2 without gallops or murmurs. Radial & DP pulses 2+ bilaterally. Abdomen: obese appearing. Msk: Normal strength and tone for age. Extremities: Warm and well perfused. No clubbing, cyanosis.  No peripheral edema Neuro: Alert and oriented X 3. Psych:  Answers questions appropriately.   Labs:   Lab Results  Component Value Date   WBC 6.4 02/03/2022   HGB 9.0 (L)  02/03/2022   HCT 31.1 (L) 02/03/2022   MCV 78.3 (L) 02/03/2022   PLT 274 02/03/2022    Recent Labs  Lab 01/29/22 1124 01/30/22 0524 02/03/22 0522  NA 144   < > 139  K 4.4   < > 4.0  CL 105   < > 101  CO2 31   < > 30  BUN 14   < > 17  CREATININE 0.75   < > 0.76  CALCIUM 9.1   < > 8.7*  PROT 6.3*  --   --   BILITOT 0.3  --   --   ALKPHOS 41  --   --   ALT 9  --   --   AST 14*  --   --   GLUCOSE 93   < > 122*   < > = values in this interval not displayed.    No results found for: CKTOTAL, CKMB, CKMBINDEX, TROPONINI  Lab Results  Component Value Date   CHOL 108 01/30/2022   Lab Results  Component Value Date   HDL 36 (L) 01/30/2022   Lab Results  Component Value Date   LDLCALC 48 01/30/2022   Lab Results  Component Value Date   TRIG 119 01/30/2022   Lab Results  Component Value Date   CHOLHDL 3.0 01/30/2022   No results found for: LDLDIRECT    Radiology: DG Chest 1 View  Result Date: 01/31/2022 CLINICAL DATA:  Shortness of breath. EXAM: CHEST  1 VIEW COMPARISON:  Radiographs 01/29/2022. FINDINGS: 0828 hours. Stable cardiac enlargement with small bilateral pleural effusions and bibasilar atelectasis. There is vascular congestion with possible mild edema. No evidence of pneumothorax. The bones appear unremarkable. Telemetry leads overlie the chest. IMPRESSION: Stable examination with cardiomegaly, bilateral pleural effusions and possible mild edema. Electronically Signed   By: Richardean Sale M.D.   On: 01/31/2022 09:47   DG Chest 2 View  Result Date: 01/29/2022 CLINICAL DATA:  Shortness of breath. EXAM: CHEST - 2 VIEW COMPARISON:  None. FINDINGS: Mild cardiomegaly is noted. Mild central pulmonary vascular congestion is noted with probable bibasilar pulmonary edema and pleural effusions with associated atelectasis. Bony thorax is unremarkable. IMPRESSION: Mild cardiomegaly with central pulmonary vascular congestion and probable bilateral pulmonary edema and atelectasis  and pleural effusions. Electronically Signed   By: Marijo Conception M.D.   On: 01/29/2022 11:58   ECHOCARDIOGRAM COMPLETE  Result Date: 01/30/2022    ECHOCARDIOGRAM REPORT   Patient Name:   Jessica Stout Date of Exam: 01/30/2022 Medical Rec #:  [id number]   Height:       64.0 in Accession #:    [id number]  Weight:       159.0 lb Date of Birth:  01-Jun-1951   BSA:  1.775 m Patient Age:    54 years    BP:           141/72 mmHg Patient Gender: F           HR:           58 bpm. Exam Location:  [facility] Procedure: 2D Echo, Cardiac Doppler and Color Doppler Indications:     CHF I50.9  History:         Patient has no prior history of Echocardiogram examinations. No                  past medical history on file.  Sonographer:     Sherrie Sport Referring Phys:  [id number] Emeterio Reeve Diagnosing Phys: Isaias Cowman MD  Sonographer Comments: Suboptimal apical window. IMPRESSIONS  1. Left ventricular ejection fraction, by estimation, is 45 to 50%. The left ventricle has mildly decreased function. The left ventricle has no regional wall motion abnormalities. There is mild left ventricular hypertrophy. Left ventricular diastolic parameters are indeterminate.  2. Right ventricular systolic function is normal. The right ventricular size is normal.  3. The mitral valve is normal in structure. Mild to moderate mitral valve regurgitation. No evidence of mitral stenosis.  4. The aortic valve is normal in structure. Aortic valve regurgitation is not visualized. No aortic stenosis is present.  5. The inferior vena cava is normal in size with greater than 50% respiratory variability, suggesting right atrial pressure of 3 mmHg. FINDINGS  Left Ventricle: Left ventricular ejection fraction, by estimation, is 45 to 50%. The left ventricle has mildly decreased function. The left ventricle has no regional wall motion abnormalities. The left ventricular internal cavity size was normal in size. There is mild left ventricular hypertrophy.  Left ventricular diastolic parameters are indeterminate. Right Ventricle: The right ventricular size is normal. No increase in right ventricular wall thickness. Right ventricular systolic function is normal. Left Atrium: Left atrial size was normal in size. Right Atrium: Right atrial size was normal in size. Pericardium: There is no evidence of pericardial effusion. Mitral Valve: The mitral valve is normal in structure. Mild to moderate mitral valve regurgitation. No evidence of mitral valve stenosis. MV peak gradient, 6.7 mmHg. The mean mitral valve gradient is 2.0 mmHg. Tricuspid Valve: The tricuspid valve is normal in structure. Tricuspid valve regurgitation is mild . No evidence of tricuspid stenosis. Aortic Valve: The aortic valve is normal in structure. Aortic valve regurgitation is not visualized. No aortic stenosis is present. Aortic valve mean gradient measures 1.0 mmHg. Aortic valve peak gradient measures 2.2 mmHg. Aortic valve area, by VTI measures 2.46 cm. Pulmonic Valve: The pulmonic valve was normal in structure. Pulmonic valve regurgitation is not visualized. No evidence of pulmonic stenosis. Aorta: The aortic root is normal in size and structure. Venous: The inferior vena cava is normal in size with greater than 50% respiratory variability, suggesting right atrial pressure of 3 mmHg. IAS/Shunts: No atrial level shunt detected by color flow Doppler.  LEFT VENTRICLE PLAX 2D LVIDd:         5.10 cm   Diastology LVIDs:         3.90 cm   LV e' medial:    3.26 cm/s LV PW:         1.40 cm   LV E/e' medial:  19.4 LV IVS:        1.20 cm   LV e' lateral:   4.68 cm/s LVOT diam:     2.00 cm  LV E/e' lateral: 13.5 LV SV:         28 LV SV Index:   16 LVOT Area:     3.14 cm  LEFT ATRIUM              Index        RIGHT ATRIUM           Index LA diam:        4.60 cm  2.59 cm/m   RA Area:     22.60 cm LA Vol (A2C):   87.2 ml  49.14 ml/m  RA Volume:   75.90 ml  42.77 ml/m LA Vol (A4C):   111.0 ml 62.55 ml/m LA  Biplane Vol: 99.9 ml  56.29 ml/m  AORTIC VALVE                    PULMONIC VALVE AV Area (Vmax):    2.17 cm     PV Vmax:        0.85 m/s AV Area (Vmean):   2.06 cm     PV Vmean:       55.700 cm/s AV Area (VTI):     2.46 cm     PV VTI:         0.107 m AV Vmax:           74.60 cm/s   PV Peak grad:   2.9 mmHg AV Vmean:          52.350 cm/s  PV Mean grad:   1.0 mmHg AV VTI:            0.114 m      RVOT Peak grad: 1 mmHg AV Peak Grad:      2.2 mmHg AV Mean Grad:      1.0 mmHg LVOT Vmax:         51.60 cm/s LVOT Vmean:        34.300 cm/s LVOT VTI:          0.089 m LVOT/AV VTI ratio: 0.78  AORTA Ao Root diam: 3.05 cm MITRAL VALVE MV Area (PHT): 6.43 cm     SHUNTS MV Area VTI:   1.45 cm     Systemic VTI:  0.09 m MV Peak grad:  6.7 mmHg     Systemic Diam: 2.00 cm MV Mean grad:  2.0 mmHg     Pulmonic VTI:  0.067 m MV Vmax:       1.29 m/s MV Vmean:      65.2 cm/s MV Decel Time: 118 msec MV E velocity: 63.40 cm/s MV A velocity: 112.00 cm/s MV E/A ratio:  0.57 Isaias Cowman MD Electronically signed by Isaias Cowman MD Signature Date/Time: 01/30/2022/4:37:47 PM    Final     ECHO LVEF 45-50% mi-mod MR   TELEMETRY reviewed by me: Sinus rhythm with periods of tachycardia at 115  EKG reviewed by me: sinus tach with PACs rate 119  ASSESSMENT AND PLAN:  The patient is a 71 year old female with a past medical history notable for type 2 diabetes, hypertension, sinus tachycardia with frequent PACs, asthma, colon polyps s/p colon resection (3 feet of colon resected 2002) who presented to [facility] ED 01/29/2022 with worsening shortness of breath.  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 She doubled her dose of lasix for 7 days without significant improvement.  Her BNP is markedly elevated to 2000, chest x-ray shows pleural effusions and central pulmonary vascular  congestion and she was clinically grossly volume overloaded on admission, appears euvolemic today.  -S/p 5 days of diuresis with IV lasix BID. Weight down 6kg since admission.  -Continue 40 mg IV Lasix this moring and will dc on 40mg  PO lasix once daily.  -Continue losartan 25 mg once daily and metoprolol XL 25mg  once daily for GDMT.  -encouraged ambulation and OOB as tolerated.  -will need f/u with Dr. Corky Sox 1-2 weeks after discharge.  #IDA Hemoglobin stable at 9.0.  -GI planning for EGD and colonoscopy today   #hypertension #hyperlipidemia -continue atorvastatin 40mg  and medications as above.   Cardiology signing off. Please contact myself or Dr. Corky Sox with questions if needed.   This patient's plan of care was discussed and created with Dr. Donnelly Angelica and he is in agreement.  Signed: Tristan Schroeder , PA-C 02/03/2022, 8:12 AM [facility] Cardiology

## 2022-02-03 NOTE — Op Note (Signed)
[facility] ?Gastroenterology ?Patient Name: Jessica Stout ?Procedure Date: 02/03/2022 8:54 AM ?MRN: [id number] ?Account #: [id number] ?Date of Birth: 24-Sep-1951 ?Admit Type: Inpatient ?Age: 71 ?Room: [facility] ENDO ROOM 1 ?Gender: Female ?Note Status: Finalized ?Instrument Name: Upper Endoscope [id number] ?Procedure:             Upper GI endoscopy ?Indications:           Iron deficiency anemia ?Providers:             Lin Landsman MD, MD ?Referring MD:          Forest Gleason Md, MD (Referring MD) ?Medicines:             General Anesthesia ?Complications:         No immediate complications. Estimated blood loss: None. ?Procedure:             Pre-Anesthesia Assessment: ?                       - Prior to the procedure, a History and Physical was  ?                       performed, and patient medications and allergies were  ?                       reviewed. The patient is competent. The risks and  ?                       benefits of the procedure and the sedation options and  ?                       risks were discussed with the patient. All questions  ?                       were answered and informed consent was obtained.  ?                       Patient identification and proposed procedure were  ?                       verified by the physician, the nurse, the  ?                       anesthesiologist, the anesthetist and the technician  ?                       in the pre-procedure area in the procedure room in the  ?                       endoscopy suite. Mental Status Examination: alert and  ?                       oriented. Airway Examination: normal oropharyngeal  ?                       airway and neck mobility. Respiratory Examination:  ?                       clear to auscultation. CV Examination: normal.  ?  Prophylactic Antibiotics: The patient does not require  ?                       prophylactic antibiotics. Prior Anticoagulants: The  ?                       patient has taken  no previous anticoagulant or  ?                       antiplatelet agents. ASA Grade Assessment: III - A  ?                       patient with severe systemic disease. After reviewing  ?                       the risks and benefits, the patient was deemed in  ?                       satisfactory condition to undergo the procedure. The  ?                       anesthesia plan was to use general anesthesia.  ?                       Immediately prior to administration of medications,  ?                       the patient was re-assessed for adequacy to receive  ?                       sedatives. The heart rate, respiratory rate, oxygen  ?                       saturations, blood pressure, adequacy of pulmonary  ?                       ventilation, and response to care were monitored  ?                       throughout the procedure. The physical status of the  ?                       patient was re-assessed after the procedure. ?                       After obtaining informed consent, the endoscope was  ?                       passed under direct vision. Throughout the procedure,  ?                       the patient's blood pressure, pulse, and oxygen  ?                       saturations were monitored continuously. The Endoscope  ?                       was introduced through the mouth, and advanced to the  ?  second part of duodenum. The upper GI endoscopy was  ?                       accomplished without difficulty. The patient tolerated  ?                       the procedure well. ?Findings: ?     Many non-bleeding superficial duodenal ulcers with a clean ulcer base  ?     (Forrest Class III) were found in the second portion of the duodenum.  ?     The largest lesion was 5 mm in largest dimension. Biopsies were taken  ?     with a cold forceps for histology. ?     Multiple dispersed small erosions with no bleeding and no stigmata of  ?     recent bleeding were found at the incisura, in the  gastric antrum and in  ?     the prepyloric region of the stomach. Biopsies were taken with a cold  ?     forceps for Helicobacter pylori testing. ?     A small hiatal hernia was present. ?     The gastric body was normal. Biopsies were taken with a cold forceps for  ?     Helicobacter pylori testing. ?     The cardia and gastric fundus were normal on retroflexion. ?     The gastroesophageal junction and examined esophagus were normal. ?Impression:            - Non-bleeding duodenal ulcers with a clean ulcer base  ?                       (Forrest Class III). Biopsied. ?                       - Erosive gastropathy with no bleeding and no stigmata  ?                       of recent bleeding. Biopsied. ?                       - Small hiatal hernia. ?                       - Normal gastric body. Biopsied. ?                       - Normal gastroesophageal junction and esophagus. ?Recommendation:        - Await pathology results. ?                       - Follow an antireflux regimen indefinitely. ?                       - Use Protonix (pantoprazole) 40 mg PO BID for 3  ?                       months. ?                       - Return to my office in 1 month. ?                       -  Proceed with colonoscopy as scheduled ?                       See colonoscopy report ?Procedure Code(s):     --- Professional --- ?                       [phone], Esophagogastroduodenoscopy, flexible,  ?                       transoral; with biopsy, single or multiple ?Diagnosis Code(s):     --- Professional --- ?                       K26.9, Duodenal ulcer, unspecified as acute or  ?                       chronic, without hemorrhage or perforation ?                       K31.89, Other diseases of stomach and duodenum ?                       K44.9, Diaphragmatic hernia without obstruction or  ?                       gangrene ?                       D50.9, Iron deficiency anemia, unspecified ?CPT copyright 2019 [employer]. All  rights reserved. ?The codes documented in this report are preliminary and upon coder review may  ?be revised to meet current compliance requirements. ?Dr. Ulyess Mort ?Beverlee Wilmarth Raeanne Gathers MD, MD ?02/03/2022 9:30:28 AM ?This report has been signed electronically. ?Number of Addenda: 0 ?Note Initiated On: 02/03/2022 8:54 AM ?Estimated Blood Loss:  Estimated blood loss: none. ?     [facility] ?

## 2022-02-03 NOTE — Assessment & Plan Note (Deleted)
Partially obstructing.  ?- Follow up pathology results.  ?- Oncology consulted for further recommendations.  ?- Will check CT abd/pelvis and CEA. ?

## 2022-02-04 ENCOUNTER — Encounter: Payer: Self-pay | Admitting: Gastroenterology

## 2022-02-04 DIAGNOSIS — J9601 Acute respiratory failure with hypoxia: Secondary | ICD-10-CM | POA: Diagnosis not present

## 2022-02-04 DIAGNOSIS — C189 Malignant neoplasm of colon, unspecified: Secondary | ICD-10-CM

## 2022-02-04 DIAGNOSIS — J449 Chronic obstructive pulmonary disease, unspecified: Secondary | ICD-10-CM | POA: Diagnosis not present

## 2022-02-04 DIAGNOSIS — I509 Heart failure, unspecified: Secondary | ICD-10-CM | POA: Diagnosis not present

## 2022-02-04 DIAGNOSIS — C184 Malignant neoplasm of transverse colon: Secondary | ICD-10-CM | POA: Diagnosis not present

## 2022-02-04 DIAGNOSIS — F419 Anxiety disorder, unspecified: Secondary | ICD-10-CM | POA: Diagnosis not present

## 2022-02-04 LAB — COMPREHENSIVE METABOLIC PANEL
ALT: 12 U/L (ref 0–44)
AST: 14 U/L — ABNORMAL LOW (ref 15–41)
Albumin: 2.9 g/dL — ABNORMAL LOW (ref 3.5–5.0)
Alkaline Phosphatase: 46 U/L (ref 38–126)
Anion gap: 10 (ref 5–15)
BUN: 17 mg/dL (ref 8–23)
CO2: 31 mmol/L (ref 22–32)
Calcium: 8.8 mg/dL — ABNORMAL LOW (ref 8.9–10.3)
Chloride: 100 mmol/L (ref 98–111)
Creatinine, Ser: 0.74 mg/dL (ref 0.44–1.00)
GFR, Estimated: >60 mL/min (ref >60)
Glucose, Bld: 129 mg/dL — ABNORMAL HIGH (ref 70–99)
Potassium: 3.8 mmol/L (ref 3.5–5.1)
Sodium: 141 mmol/L (ref 135–145)
Total Bilirubin: 0.5 mg/dL (ref 0.3–1.2)
Total Protein: 5.9 g/dL — ABNORMAL LOW (ref 6.5–8.1)

## 2022-02-04 LAB — GLUCOSE, CAPILLARY
Glucose-Capillary: 132 mg/dL — ABNORMAL HIGH (ref 70–99)
Glucose-Capillary: 130 mg/dL — ABNORMAL HIGH (ref 70–99)
Glucose-Capillary: 97 mg/dL (ref 70–99)
Glucose-Capillary: 184 mg/dL — ABNORMAL HIGH (ref 70–99)

## 2022-02-04 LAB — CBC
HCT: 32.2 % — ABNORMAL LOW (ref 36.0–46.0)
Hemoglobin: 9.2 g/dL — ABNORMAL LOW (ref 12.0–15.0)
MCH: 22.5 pg — ABNORMAL LOW (ref 26.0–34.0)
MCHC: 28.6 g/dL — ABNORMAL LOW (ref 30.0–36.0)
MCV: 78.7 fL — ABNORMAL LOW (ref 80.0–100.0)
Platelets: 289 10*3/uL (ref 150–400)
RBC: 4.09 MIL/uL (ref 3.87–5.11)
RDW: 18.5 % — ABNORMAL HIGH (ref 11.5–15.5)
WBC: 8.8 10*3/uL (ref 4.0–10.5)
nRBC: 0 % (ref 0.0–0.2)

## 2022-02-04 MED ORDER — FUROSEMIDE 10 MG/ML IJ SOLN
40.0000 mg | Freq: Three times a day (TID) | INTRAMUSCULAR | Status: AC
  Administered 2022-02-04 (×2): 40 mg via INTRAVENOUS
  Filled 2022-02-04 (×2): qty 4

## 2022-02-04 NOTE — Consult Note (Signed)
Patient ID: Jessica Stout, female   DOB: 1951-07-22, 71 y.o.   MRN: [id number] ? ?HPI ?Jessica Stout is a 71 y.o. female 71 y.o. with hx of history of T2DM, HTN, asthma, Anxiety, CHF, Hx of CVA, HLD, HTN, DM colon polyps s/p removal of  partial colectomy in 2002 by Dr. Yvonna Alanis in [place], [place]. complicated by SBO and apparently a EC fistula and multiple hernias.She also has had at least 3 hernia operation. Last one [facility] in [place] Abdominal wall reconstruction w Mesh and abdominoplasty by Dr. Ned Clines group. ?She presents to the hospital with increased dyspnea on exertion found to be anemic and with a heart failure exacerbation. ?Further work-up included evaluation by GI who performed upper and lower scopes.  Please note that I personally review the colonoscopy and there was evidence of a large near obstructing transverse colon mass.  There was no mention of a prior colon resection.  Biopsies today were reviewed as well showing evidence of invasive adenocarcinoma.  She did have a high-grade dysplasia on one of the sigmoid polyps well as condyloma acuminata within the rectum.  She is currently still wearing oxygen but is getting slowly better. ?Does have history of CHF, does have dyspnea on exertion and frequent falls.  She uses a walker to get around.  She has had multiple back operations as well ?CBC today shows normal platelets hemoglobin of 9.2 white count is normal.  CMP shows a normal creatinine and an albumin of 2.9, rest of the labs are normal ?CT pers. Reviewed large 7 cm Transverse colon, no obstruction, left adrenal mass  , left lower lobe mass. Ascites and pleural effusions. LVEF 45 %. ?She did have some hematochezia ? ? ?HPI ? ? ? ?Past Surgical History:  ?Procedure Laterality Date  ? COLONOSCOPY WITH PROPOFOL N/A 02/03/2022  ? Procedure: COLONOSCOPY WITH PROPOFOL;  Surgeon: Lin Landsman, MD;  Location: [facility] ENDOSCOPY;  Service: Gastroenterology;  Laterality: N/A;  ? ESOPHAGOGASTRODUODENOSCOPY (EGD)  WITH PROPOFOL N/A 02/03/2022  ? Procedure: ESOPHAGOGASTRODUODENOSCOPY (EGD) WITH PROPOFOL;  Surgeon: Lin Landsman, MD;  Location: [facility] ENDOSCOPY;  Service: Gastroenterology;  Laterality: N/A;  ? GIVENS CAPSULE STUDY N/A 02/03/2022  ? Procedure: GIVENS CAPSULE STUDY;  Surgeon: Lin Landsman, MD;  Location: [facility] ENDOSCOPY;  Service: Gastroenterology;  Laterality: N/A;  possible Capsule Study  ? ? ?Fam Hx. Father w colon Cancer ? ?Social History ?Social History  ? ?Tobacco Use  ? Smoking status: Former  ?  Types: Cigarettes  ?  Quit date: 01/01/2022  ?  Years since quitting: 0.0  ? ? ?Allergies  ?Allergen Reactions  ? Codeine Rash  ?  Happened in 1970s  ? ? ?Current Facility-Administered Medications  ?Medication Dose Route Frequency Provider Last Rate Last Admin  ? 0.9 %  sodium chloride infusion  250 mL Intravenous PRN Emeterio Reeve, DO      ? acetaminophen (TYLENOL) tablet 650 mg  650 mg Oral Q4H PRN Emeterio Reeve, DO   650 mg at 02/04/22 1314  ? furosemide (LASIX) injection 40 mg  40 mg Intravenous Q8H Patrecia Pour, MD   40 mg at 02/04/22 1312  ? insulin aspart (novoLOG) injection 0-15 Units  0-15 Units Subcutaneous TID WC Emeterio Reeve, DO   2 Units at 02/04/22 1225  ? insulin aspart (novoLOG) injection 0-5 Units  0-5 Units Subcutaneous QHS Emeterio Reeve, DO      ? insulin aspart (novoLOG) injection 3 Units  3 Units Subcutaneous TID WC Emeterio Reeve,  DO   3 Units at 02/04/22 1225  ? ipratropium-albuterol (DUONEB) 0.5-2.5 (3) MG/3ML nebulizer solution 3 mL  3 mL Nebulization Q4H PRN Emeterio Reeve, DO      ? losartan (COZAAR) tablet 25 mg  25 mg Oral Daily Emeterio Reeve, DO   25 mg at 02/04/22 7062  ? magnesium oxide (MAG-OX) tablet 400 mg  400 mg Oral BID Tristan Schroeder, PA-C   400 mg at 02/04/22 3762  ? melatonin tablet 2.5 mg  2.5 mg Oral QHS Patrecia Pour, MD   2.5 mg at 02/03/22 2134  ? metoprolol succinate (TOPROL-XL) 24 hr tablet 25 mg  25 mg Oral Daily Tristan Schroeder, PA-C   25 mg at 02/04/22 8315  ? ondansetron (ZOFRAN) injection 4 mg  4 mg Intravenous Q6H PRN Emeterio Reeve, DO   4 mg at 02/03/22 1761  ? polyethylene glycol-electrolytes (NuLYTELY) solution 2,000 mL  2,000 mL Oral Once PRN Annamaria Helling, DO      ? sodium chloride flush (NS) 0.9 % injection 3 mL  3 mL Intravenous Q12H Emeterio Reeve, DO   3 mL at 02/04/22 1228  ? sodium chloride flush (NS) 0.9 % injection 3 mL  3 mL Intravenous PRN Emeterio Reeve, DO      ? venlafaxine XR (EFFEXOR-XR) 24 hr capsule 75 mg  75 mg Oral Q breakfast Emeterio Reeve, DO   75 mg at 02/04/22 6073  ? ? ? ?Review of Systems ?Full ROS  was asked and was negative except for the information on the HPI ? ?Physical Exam ?Blood pressure (!) 122/59, pulse 74, temperature 97.6 ?F (36.4 ?C), temperature source Oral, resp. rate 18, height 5\' 4"  (1.626 m), weight 66.5 kg, SpO2 95 %. ?CONSTITUTIONAL: NAD. ?EYES: Pupils are equal, round, Sclera are non-icteric. ?EARS, NOSE, MOUTH AND THROAT: She is wearing a mask. Marland Kitchen Hearing is intact to voice. ?LYMPH NODES:  Lymph nodes in the neck are normal. ?RESPIRATORY:  Lungs are clear. There is normal respiratory effort, with equal breath sounds bilaterally, and without pathologic use of accessory muscles. ?CARDIOVASCULAR: Heart is regular without murmurs, gallops, or rubs. ?GI: The abdomen is  soft, nontender, Anterior abdominal wall with hard mesh palpated. NO open wounds.  Non distended. There are no palpable masses. There is no hepatosplenomegaly. There are normal bowel sounds in all quadrants. ?GU: Rectal deferred.   ?MUSCULOSKELETAL: Normal muscle strength and tone. No cyanosis or edema.   ?SKIN: Turgor is good and there are no pathologic skin lesions or ulcers. ?NEUROLOGIC: Motor and sensation is grossly normal. Cranial nerves are grossly intact. ?PSYCH:  Oriented to person, place and time. Affect is normal. ? ?Data Reviewed ? ?I have personally reviewed the patient's  imaging, laboratory findings and medical records.   ? ?Assessment/Plan ?71 year old female with significant medical issues including heart failure, diabetes, history of stroke.  Presents with a transverse colon mass consistent with invasive adenocarcinoma.  Current work-up suggests either metastatic disease to the left lung and left adrenal versus an additional lung primary with metastasis to the left adrenal. ?Clinically she is not obstructed and does not require emergent surgical intervention.  I had an extensive discussion with the patient and her husband.  I do think that appropriate work-up with PET/CT plus or minus biopsy of the lung or adrenal mass is indicated.  She does have a hostile abdomen and I anticipate that this will be a major endeavor to get access to the abdominal cavity.  She does have  a complex surgical history which made things even more complicated.  We will start 1 step at a time and perform appropriate staging.  I will be happy to see her in my office in a few weeks.  She may require port placement if neoadjuvant chemo is initiated.  Patient wishes to fight this cancer and be aggressive with it. ?Please note that I spent greater than 75 minutes in this encounter including personally reviewing records, personally reviewing medical imaging studies, coordinating her care and performing a proper documentation ? ?Caroleen Hamman, MD FACS ?General Surgeon ?02/04/2022, 4:02 PM ? ?  ?

## 2022-02-04 NOTE — Anesthesia Postprocedure Evaluation (Signed)
Anesthesia Post Note ? ?Patient: Blakley Michna ? ?Procedure(s) Performed: ESOPHAGOGASTRODUODENOSCOPY (EGD) WITH PROPOFOL ?GIVENS CAPSULE STUDY ?COLONOSCOPY WITH PROPOFOL ? ?Patient location during evaluation: PACU ?Anesthesia Type: General ?Level of consciousness: awake and alert ?Pain management: pain level controlled ?Vital Signs Assessment: post-procedure vital signs reviewed and stable ?Respiratory status: spontaneous breathing, nonlabored ventilation, respiratory function stable and patient connected to nasal cannula oxygen ?Cardiovascular status: blood pressure returned to baseline and stable ?Postop Assessment: no apparent nausea or vomiting ?Anesthetic complications: no ? ? ?No notable events documented. ? ? ?Last Vitals:  ?Vitals:  ? 02/04/22 0900 02/04/22 1510  ?BP:  (!) 122/59  ?Pulse:  74  ?Resp:    ?Temp:  36.4 ?C  ?SpO2: 93% 95%  ?  ?Last Pain:  ?Vitals:  ? 02/04/22 1510  ?TempSrc: Oral  ?PainSc:   ? ? ?  ?  ?  ?  ?  ?  ? ?Molli Barrows ? ? ? ? ?

## 2022-02-04 NOTE — Progress Notes (Signed)
Patient resting sat 92% room air, ambulation saturation room air 83%, placed on oxygen 2L sat 93%. ?

## 2022-02-04 NOTE — Progress Notes (Signed)
SATURATION QUALIFICATIONS: (This note is used to comply with regulatory documentation for home oxygen) ? ?Patient Saturations on Room Air at Rest = 92% ? ?Patient Saturations on Room Air while Ambulating = 83% ? ?Patient Saturations on 2 Liters of oxygen while Ambulating = 93% ? ?Please briefly explain why patient needs home oxygen: ?

## 2022-02-04 NOTE — Assessment & Plan Note (Addendum)
Partially obstructing mass noted on colonoscopy 3/6 with pathology revealing invasive adenocarcinoma. CT revealed pulmonary nodule and left adrenal lesion which will need attention at follow up with PET/CT. CEA pending. ?- Oncology, Dr. Grayland Ormond consulted and will follow up ?- Surgery, Dr. Dahlia Byes also consulted and will see this PM. ?

## 2022-02-04 NOTE — Progress Notes (Signed)
?Progress Note ? ?Patient: Jessica Stout ZPH:[id number] DOB: 07-27-1951  ?DOA: 01/29/2022  DOS: 02/04/2022  ?  ?Brief hospital course: ?Jessica Stout is a 71 y.o. female with a history of T2DM, HTN, HLD, anxiety/depression who presented to the ED 3/1 with several weeks of dyspnea with exertion, worsening to dyspnea at rest associated with leg swelling and severe, progressive fatigue. She had been evaluated by cardiology recently, echocardiogram ordered and pending. GI referral was made due to significant anemia (hgb 9.3g/dl from previous 14g/dl), and an episode of pink stool. In the ED she was afebrile, newly hypoxemic, with sinus tachycardia. CXR with cardiomegaly, pulmonary edema, BNP recently >2000. IV lasix given with some improvement, GDMT initiated with cardiology consultation for LVEF 45-50%. With improved cardiorespiratory status, plan was to undergo colonoscopy, EGD +/- VCE 3/4. She was sent down with supplemental oxygen (though not noted to be hypoxic), so anesthesia deferred work up. Diuresis has continued, hypoxia improved. Colonoscopy and EGD pursued 3/6 with results showing duodenal ulcer, erosive gastropathy, and colon mass. ? ?Assessment and Plan: ?* New onset of HFrEF ?LVEF 45-50%, mild-mod MR, IVC normal. ?- Continue lasix 40mg  IV BID (got an oral dose early this morning) since renal function stable and still with evidence of pulmonary edema. Recheck BMP, I/O, weights in AM. ?- ARB, BB ordered for GDMT. ?- Seen at [facility], appreciate recommendations. ? ?Colon cancer ([place]) ?Partially obstructing mass noted on colonoscopy 3/6 with pathology revealing invasive adenocarcinoma. CT revealed pulmonary nodule and left adrenal lesion which will need attention at follow up with PET/CT. CEA pending. ?- Oncology, Dr. Grayland Ormond consulted and will follow up ?- Surgery, Dr. Dahlia Byes also consulted and will see this PM. ? ?Colonic mass ?Partially obstructing.  ?- Follow up pathology results.  ?- Oncology consulted  for further recommendations.  ?- Will check CT abd/pelvis and CEA. ? ?Erosive gastropathy ?No recent bleeding.  ?- PPI ?- Avoid NSAIDs, etc.  ? 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  ?- Follow up biopsy from EGD 3/6. Continue PPI 40mg  po BID x3 months, then daily indefinitely. ?- Follow up with GI, Dr. Marius Ditch in ~1 month to arrange repeat EGD. ? ?Hypokalemia ?Resolved. ? ?Iron deficiency anemia ?Microcytic and trending down past few months, due to iron deficiency and due to malignancy. Iron stores low, high TIBC, 5% sat. Bilirubin wnl. ?- IV iron given 3/3, repeated 3/6 per GI, will discharge on oral supplementation. ?- Hgb stabilized at 9g/dl. ?- With no active bleeding seen on endoscopic evaluations and current suspicion for malignancy, initiated prophylactic VTE ppx. ?- PPI will be continued per GI rec's ? ?Acute respiratory failure with hypoxia ([place]) ?Due to new diagnosis CHF with pulmonary edema. Has baseline COPD, though no wheezing to suggest exacerbation, just lowers pulmonary reserve. ?- Remains hypoxic with no PE on CTA chest. No consolidation. Suspect atelectasis is somewhat contributing though interstitial prominence and crackles on exam suggest continued pulmonary edema. Will give IV lasix x2 today and recheck ambulatory pulse oximetry in AM. ? ?Anxiety and depression ?Quiescent.  ?- Continue home effexor ? ?Hyperlipidemia ?Pt not taking rosuvastatin. No definitive PAD, CAD, though with DM, would qualify for statin regardless. LDL is 48. Will discuss with patient. ? ?Chronic obstructive pulmonary disease ([place]) ?No home controlled medications. No evidence of exacerbation.  ?- prn duoneb. ? ?Diabetes mellitus type 2, controlled, without complications ([place]) ?HbA1c 6.5%.  ?- Hold home sitaglipitin, metformin ?- Covering with novolog 3u TIDWC + mod SSI. Remaining at  inpatient goal without hypoglycemia. ? ? ?Subjective: Some shortness of breath on  exertion remains, still hypoxic, not back to baseline yet. Leg swelling much improved. No abd pain, nausea or vomiting.  ? ?Objective: ?Vitals:  ? 02/04/22 0306 02/04/22 0801 02/04/22 0848 02/04/22 0900  ?BP: 124/63 135/69    ?Pulse: 68 73    ?Resp: 18     ?Temp: 98.1 ?F (36.7 ?C) 97.8 ?F (36.6 ?C)    ?TempSrc:      ?SpO2: 96% 94% 92% 93%  ?Weight: 66.5 kg     ?Height:      ? ?Gen: 71 y.o. female in no distress ?Pulm: Nonlabored breathing room air, crackles evident at bases, much improved but audible. ?CV: Regular rate and rhythm. No murmur, rub, or gallop. No JVD, no significant dependent edema. ?GI: Abdomen soft, I do not palpate a mass, no tenderness non-distended, with normoactive bowel sounds.  ?Ext: Warm, no deformities ?Skin: No rashes, lesions or ulcers on visualized skin. ?Neuro: Alert and oriented. No focal neurological deficits. ?Psych: Judgement and insight appear fair. Mood euthymic & affect congruent. Behavior is appropriate.   ? ?Data Personally reviewed: ?Hgb 9.3 > 9.2 > 9 > 9, microcytic indices, ferritin 11, iron 27, TIBC 545, 5% sat, B12 558, folate 12.9. ?Na 146 > 143 ?SCr 0.75 > 0.93 > 0.77 > 0.48 > 0.54 ?LFTs wnl, TBili 0.3.  ?K 3.3 ?LDL 48, HDL 36.  ?Glucose remains at inpatient goal.  ? ?CXR (personal review): Cardiomegaly with vascular congestion centrally, suggestion of effusions.  ?CTA chest, CT abd/pelvis 3/6: Pending. ?   ?SARS-CoV-2 PCR (3/1): Negative ?Influenza A/B: Negative ? ?EGD 02/03/2022 Dr. Marius Ditch:  ?Impression:    - Non-bleeding duodenal ulcers with a clean ulcer base  ?                       (Forrest Class III). Biopsied. ?                       - Erosive gastropathy with no bleeding and no stigmata  ?                       of recent bleeding. Biopsied. ?                       - Small hiatal hernia. ?                       - Normal gastric body. Biopsied. ?                       - Normal gastroesophageal junction and esophagus. ?Recommendation: - Await pathology results. ?                        - Follow an antireflux regimen indefinitely. ?                       - Use Protonix (pantoprazole) 40 mg PO BID for 3  ?                       months. ?                       - Return to my office in 1 month. ?                       -  Proceed with colonoscopy as scheduled ?                       See colonoscopy report ? ?Colonoscopy 02/03/2022 Dr. Marius Ditch:  ?Impression:    - Preparation of the colon was fair. ?                       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                       cm proximal to the anus. Biopsied. Tattooed. ?                       - One 10 mm polyp in the descending colon, removed  ?                       with a hot snare. Resected and retrieved. ?                       - Four 5 to 7 mm polyps in the sigmoid colon, removed  ?                       with a cold snare. Resected and retrieved. ?                       - Polypoid lesion in the distal rectum. Biopsied. ?                       - Non-bleeding external and internal hemorrhoids. ?                       - Severe diverticulosis in the entire examined colon.  ?                       There was no evidence of diverticular bleeding. ?Recommendation: - Return patient to hospital ward for ongoing care. ?                       - Cardiac diet today. ?                       - Continue present medications. ?                       - Await pathology results. ?                       - Refer to an oncologist at appointment to be  ?                       scheduled. ? ? ? ?Family Communication:  None at bedside ? ?Disposition: ?Status is: Inpatient ?Remains inpatient appropriate because: Continue IV diuresis, surgery consultation ?Planned Discharge Destination: Home likely 3/8. ? ?Patrecia Pour, MD ?02/04/2022 1:55 PM ?Page by [email or web address]  ?

## 2022-02-05 DIAGNOSIS — I5021 Acute systolic (congestive) heart failure: Secondary | ICD-10-CM | POA: Diagnosis not present

## 2022-02-05 DIAGNOSIS — C184 Malignant neoplasm of transverse colon: Secondary | ICD-10-CM | POA: Diagnosis not present

## 2022-02-05 DIAGNOSIS — K269 Duodenal ulcer, unspecified as acute or chronic, without hemorrhage or perforation: Secondary | ICD-10-CM

## 2022-02-05 LAB — BASIC METABOLIC PANEL
Anion gap: 10 (ref 5–15)
BUN: 16 mg/dL (ref 8–23)
CO2: 32 mmol/L (ref 22–32)
Calcium: 8.9 mg/dL (ref 8.9–10.3)
Chloride: 100 mmol/L (ref 98–111)
Creatinine, Ser: 0.65 mg/dL (ref 0.44–1.00)
GFR, Estimated: >60 mL/min (ref >60)
Glucose, Bld: 137 mg/dL — ABNORMAL HIGH (ref 70–99)
Potassium: 3.5 mmol/L (ref 3.5–5.1)
Sodium: 142 mmol/L (ref 135–145)

## 2022-02-05 LAB — MAGNESIUM: Magnesium: 1.7 mg/dL (ref 1.7–2.4)

## 2022-02-05 LAB — CEA: CEA: 171 ng/mL — ABNORMAL HIGH (ref 0.0–4.7)

## 2022-02-05 LAB — GLUCOSE, CAPILLARY
Glucose-Capillary: 134 mg/dL — ABNORMAL HIGH (ref 70–99)
Glucose-Capillary: 114 mg/dL — ABNORMAL HIGH (ref 70–99)

## 2022-02-05 LAB — SURGICAL PATHOLOGY

## 2022-02-05 MED ORDER — PANTOPRAZOLE SODIUM 40 MG PO TBEC: 40.0000 mg | DELAYED_RELEASE_TABLET | Freq: Every day | ORAL | 0 refills | Status: DC

## 2022-02-05 MED ORDER — METOPROLOL SUCCINATE ER 25 MG PO TB24: 25.0000 mg | ORAL_TABLET | Freq: Every day | ORAL | 0 refills | Status: DC

## 2022-02-05 NOTE — Discharge Summary (Addendum)
Physician Discharge Summary  Jessica Stout JHE:[id number] DOB: 02-Dec-1950 DOA: 01/29/2022  PCP: Albina Billet, MD  Admit date: 01/29/2022 Discharge date: 02/05/2022  Admitted From: home  Disposition:  home   Recommendations for Outpatient Follow-up:  Follow up with onco, Dr. Grayland Ormond, in 1 week F/u w/ cardio, Dr. Corky Sox, in 1-2 weeks F/u w/ gen surg, Dr. Dahlia Byes, on 02/24/22  F/u w/ GI, Dr. Marius Ditch, in 1 month   Home Health: Equipment/Devices: 2L [place]   Discharge Condition: stable  CODE STATUS: full  Diet recommendation: Heart Healthy / Carb Modified  Brief/Interim Summary: HPI was taken from Dr. Sheppard Coil: Jessica Stout is a 71 y.o. female who has been getting more more short of breath for the last 2 to 3 weeks.  She had some material she coughed up a couple weeks ago that was pinkish but it looked like the tea she drinks.  She also had 1 pinkish stool about 2 to 3 weeks ago.  She has not had any since.  She reports leg swelling as well.  She is not running a fever.  She is short of breath.  She does not have any chest pain.  She has some achy pain across the bottom of her ribs that it hurts when she pushes on it but it is not worse with breathing.  In ED EKG shows sinus tach at 119 with a rightward axis some PACs.  No obvious acute ST-T changes. Chest x-ray shows what appears to be CHF with some mild cardiomegaly. Recent BNP >2000. Treated w/ Lasix and O2 Reviewed recent cardiology notes: Seen at [facility] clinic 01/23/2022 for initial consult for evaluation for atrial fib, cardiology diagnosed with sinus tachycardia with frequent P AC.  At that time was reporting orthopnea and significant bilateral lower extremity edema  As per Dr. Bonner Puna: Jessica Stout is a 71 y.o. female with a history of T2DM, HTN, HLD, anxiety/depression who presented to the ED 3/1 with several weeks of dyspnea with exertion, worsening to dyspnea at rest associated with leg swelling and severe, progressive fatigue. She had been  evaluated by cardiology recently, echocardiogram ordered and pending. GI referral was made due to significant anemia (hgb 9.3g/dl from previous 14g/dl), and an episode of pink stool. In the ED she was afebrile, newly hypoxemic, with sinus tachycardia. CXR with cardiomegaly, pulmonary edema, BNP recently >2000. IV lasix given with some improvement, GDMT initiated with cardiology consultation for LVEF 45-50%. With improved cardiorespiratory status, plan was to undergo colonoscopy, EGD +/- VCE 3/4. She was sent down with supplemental oxygen (though not noted to be hypoxic), so anesthesia deferred work up. Diuresis has continued, hypoxia improved. Colonoscopy and EGD pursued 3/6 with results showing duodenal ulcer, erosive gastropathy, and colon mass.   As per Dr. Jimmye Norman 02/05/22: Pt was medically stable from cardiac, onco, GI & general surg, & medical standpoint. Pt will f/u w/ onco, Dr. Grayland Ormond, in 1 week; w/ general surg, Dr. Dahlia Byes 02/24/22, & f/u w/ cardio, Dr. Corky Sox, in 1-2 weeks & f/u w/ GI, Dr. Marius Ditch, in 1 month. For more information, please see previous progress/consult notes.   Discharge Diagnoses:  Principal Problem:   New onset of HFrEF Active Problems:   Diabetes mellitus type 2, controlled, without complications (HCC)   Chronic obstructive pulmonary disease (HCC)   Hyperlipidemia   Anxiety and depression   Acute respiratory failure with hypoxia (HCC)   Iron deficiency anemia   Hypokalemia   Duodenal ulcer without hemorrhage or perforation and without obstruction  Erosive gastropathy   Colonic mass   Colon cancer (HCC)  Acute systolic CHF: echo shows EF 45-50%, mild-mod MR. Continue on lasix. Monitor I/Os. Continue on metoprolol, losartan.    Colon cancer: pathology revealing invasive adenocarcinoma. CT revealed pulmonary nodule and left adrenal lesion which will need attention at follow up with PET/CT. CEA is 171. Onco & general surg following    Erosive gastropathy: continue on  PPI as per GI. Avoid NSAIDs    Duodenal ulcer: w/o hemorrhage or perforation and without obstruction. Pathology is pending. Continue on PPI BID x 3 months. F/u w/ GI, Dr. Marius Ditch in 1 month for repeat EGD   Hypokalemia: WNL today    Iron deficiency anemia: s/p IV iron given.    Acute hypoxic respiratory failure: likely secondary to CHF w/ hx of COPD. Continue on supplemental oxygen and wean as tolerated    Depression: severity unknown. Continue on home dose of effexor    HLD: was not previously taking a statin    COPD: w/o exacerbation. Continue on bronchodilators    DM2: HbA1c 6.5, well controlled. Continue on SSI w/ accuchecks   Discharge Instructions  Discharge Instructions     Diet - low sodium heart healthy   Complete by: As directed    Diet Carb Modified   Complete by: As directed    Discharge instructions   Complete by: As directed    F/u w/ oncology, Dr. Grayland Ormond, in 1 week. F/u w/ cardiology, Dr. Corky Sox, in 1-2 weeks. F/u w/ general surgery, Dr. Dahlia Byes, on 02/24/22.   Increase activity slowly   Complete by: As directed       Allergies as of 02/05/2022       Reactions   Codeine Rash   Happened in 1970s        Medication List     STOP taking these medications    losartan-hydrochlorothiazide 100-12.5 MG tablet Commonly known as: HYZAAR   meloxicam 7.5 MG tablet Commonly known as: MOBIC   rosuvastatin 20 MG tablet Commonly known as: CRESTOR       TAKE these medications    acetaminophen 325 MG tablet Commonly known as: TYLENOL Take 650 mg by mouth every 6 (six) hours as needed.   albuterol 108 (90 Base) MCG/ACT inhaler Commonly known as: VENTOLIN HFA Inhale into the lungs.   atorvastatin 40 MG tablet Commonly known as: LIPITOR Take 40 mg by mouth daily.   fenofibrate 54 MG tablet Take 54 mg by mouth daily.   furosemide 40 MG tablet Commonly known as: LASIX Take 40 mg by mouth daily.   gabapentin 600 MG tablet Commonly known as:  NEURONTIN Take by mouth.   Janumet 50-1000 MG tablet Generic drug: sitaGLIPtin-metformin Take 1 tablet by mouth daily.   Lantus SoloStar 100 UNIT/ML Solostar Pen Generic drug: insulin glargine Inject 10 Units into the skin at bedtime.   losartan 25 MG tablet Commonly known as: COZAAR Take 25 mg by mouth daily.   metoprolol succinate 25 MG 24 hr tablet Commonly known as: TOPROL-XL Take 1 tablet (25 mg total) by mouth daily. Start taking on: February 06, 2022   pantoprazole 40 MG tablet Commonly known as: Protonix Take 1 tablet (40 mg total) by mouth daily.   traMADol 50 MG tablet Commonly known as: ULTRAM Take 50 mg by mouth 4 (four) times daily as needed.   venlafaxine XR 75 MG 24 hr capsule Commonly known as: EFFEXOR-XR Take 75 mg by mouth daily.  Durable Medical Equipment  (From admission, onward)           Start     Ordered   02/05/22 1140  For home use only DME oxygen  Once       Question Answer Comment  Length of Need Lifetime   Mode or (Route) Nasal cannula   Liters per Minute 2   Frequency Continuous (stationary and portable oxygen unit needed)   Oxygen conserving device Yes   Oxygen delivery system Gas      02/05/22 1140            Follow-up Information     Andrez Grime, MD. Go on 02/18/2022.   Specialty: Cardiology Why: hospital follow up 1-2 weeks after discharge @ 11:45am Contact information: [place] [address] [phone]         Jules Husbands, MD Follow up on 02/26/2022.   Specialty: General Surgery Why: @ 9:15am Contact information: [address] [place] [address] [phone]         Lloyd Huger, MD Follow up in 1 week(s).   Specialty: Oncology Why: Patient will need to make a follow up appointment. Contact information: [place] [address] [phone]                Allergies  Allergen Reactions    Codeine Rash    Happened in 1970s    Consultations: GI  Onco Cardio General surg    Procedures/Studies: DG Chest 1 View  Result Date: 01/31/2022 CLINICAL DATA:  Shortness of breath. EXAM: CHEST  1 VIEW COMPARISON:  Radiographs 01/29/2022. FINDINGS: 0828 hours. Stable cardiac enlargement with small bilateral pleural effusions and bibasilar atelectasis. There is vascular congestion with possible mild edema. No evidence of pneumothorax. The bones appear unremarkable. Telemetry leads overlie the chest. IMPRESSION: Stable examination with cardiomegaly, bilateral pleural effusions and possible mild edema. Electronically Signed   By: Richardean Sale M.D.   On: 01/31/2022 09:47   DG Chest 2 View  Result Date: 01/29/2022 CLINICAL DATA:  Shortness of breath. EXAM: CHEST - 2 VIEW COMPARISON:  None. FINDINGS: Mild cardiomegaly is noted. Mild central pulmonary vascular congestion is noted with probable bibasilar pulmonary edema and pleural effusions with associated atelectasis. Bony thorax is unremarkable. IMPRESSION: Mild cardiomegaly with central pulmonary vascular congestion and probable bilateral pulmonary edema and atelectasis and pleural effusions. Electronically Signed   By: Marijo Conception M.D.   On: 01/29/2022 11:58   CT Angio Chest Pulmonary Embolism (PE) W or WO Contrast  Result Date: 02/03/2022 CLINICAL DATA:  Pulmonary embolism (PE) suspected, unknown D-dimer; Colon cancer, non-metastatic, staging Metastatic status unknown EXAM: CT ANGIOGRAPHY CHEST CT ABDOMEN AND PELVIS WITH CONTRAST TECHNIQUE: Multidetector CT imaging of the chest was performed using the standard protocol during bolus administration of intravenous contrast. Multiplanar CT image reconstructions and MIPs were obtained to evaluate the vascular anatomy. Multidetector CT imaging of the abdomen and pelvis was performed using the standard protocol during bolus administration of intravenous contrast. RADIATION DOSE REDUCTION: This exam  was performed according to the departmental dose-optimization program which includes automated exposure control, adjustment of the mA and/or kV according to patient size and/or use of iterative reconstruction technique. CONTRAST:  50mL OMNIPAQUE IOHEXOL 350 MG/ML SOLN COMPARISON:  None. FINDINGS: CTA CHEST FINDINGS Cardiovascular: Satisfactory opacification of the pulmonary arteries to the segmental level. No evidence of pulmonary embolism. Mild cardiomegaly. Small pericardial effusion. Coronary artery calcifications. Atherosclerotic calcifications of the  aortic arch. Mediastinum/Nodes: No mediastinal, hilar, or axillary lymphadenopathy. Subcentimeter hypodense right thyroid nodule. Esophagus is unremarkable. The trachea is unremarkable. Lungs/Pleura: Moderate right and small left pleural effusions. Bibasilar atelectasis. Mild centrilobular and paraseptal emphysema, mid apical predominant. There is a solid, 1.4 cm pulmonary nodule in the superior segment of the left lower lobe (series 6, image 35). Mild interlobular septal thickening and dependent ground-glass, mid to apical predominant. Musculoskeletal: There is a subacute-chronic right anterior ninth rib fracture without evidence of underlying lesion. No acute osseous abnormality. No suspicious lytic or blastic lesions. Review of the MIP images confirms the above findings. CT ABDOMEN and PELVIS FINDINGS Hepatobiliary: There is a low-density lesion in the inferior right hepatic lobe, density consistent with simple cyst. The gallbladder is nondilated. There is a visible calcified intraluminal stone. Pancreas: Unremarkable. No pancreatic ductal dilatation or surrounding inflammatory changes. Spleen: Normal in size without focal abnormality. Adrenals/Urinary Tract: There is a heterogeneous left adrenal mass measuring 3.1 x 2.6 cm (series 3, image 26). Mild right adrenal thickening without discrete nodule. No hydronephrosis or nephrolithiasis. There is a small right  upper pole renal cyst. Bladder is unremarkable. Stomach/Bowel: There is a mass of the mid transverse colon with heterogeneous enhancement and marked wall thickening. This measures at least 7.4 x 6.1 x 5.3 cm (coronal image 16, axial image 36). This results in narrowing of the colon colonic lumen. There is adjacent stranding and abutment of the anterior abdominal wall is possibly involve (sagittal image 77).There is no evidence of bowel obstruction. Colonic diverticulosis. The stomach appears unremarkable. Vascular/Lymphatic: Aortoiliac atherosclerotic calcifications. No AAA. No lymphadenopathy. Reproductive: Unremarkable. Other: Prior ventral hernia repair. Small volume abdominopelvic ascites. Musculoskeletal: No acute osseous abnormality. No suspicious lytic or blastic lesions. Multilevel degenerative changes of the spine. Review of the MIP images confirms the above findings. IMPRESSION: Large heterogeneous mass in the mid transverse colon measuring at least 7.4 x 6.1 x 5.3 cm concerning for malignancy. Adjacent peritoneal stranding and small volume abdominopelvic ascites. This mass abuts the anterior abdominal wall and is possibly involved. Heterogeneous 3.1 x 2.6 cm left adrenal mass, concerning for a metastasis. Solid 1.4 cm pulmonary nodule in the superior segment of the left lower lobe, concerning for metastasis. Moderate right and small left pleural effusions with adjacent atelectasis. Mild interstitial pulmonary edema. Cardiomegaly. Small pericardial effusion. No evidence of pulmonary embolism. Electronically Signed   By: Maurine Simmering M.D.   On: 02/03/2022 15:28   CT ABDOMEN PELVIS W CONTRAST  Result Date: 02/03/2022 CLINICAL DATA:  Pulmonary embolism (PE) suspected, unknown D-dimer; Colon cancer, non-metastatic, staging Metastatic status unknown EXAM: CT ANGIOGRAPHY CHEST CT ABDOMEN AND PELVIS WITH CONTRAST TECHNIQUE: Multidetector CT imaging of the chest was performed using the standard protocol during  bolus administration of intravenous contrast. Multiplanar CT image reconstructions and MIPs were obtained to evaluate the vascular anatomy. Multidetector CT imaging of the abdomen and pelvis was performed using the standard protocol during bolus administration of intravenous contrast. RADIATION DOSE REDUCTION: This exam was performed according to the departmental dose-optimization program which includes automated exposure control, adjustment of the mA and/or kV according to patient size and/or use of iterative reconstruction technique. CONTRAST:  29mL OMNIPAQUE IOHEXOL 350 MG/ML SOLN COMPARISON:  None. FINDINGS: CTA CHEST FINDINGS Cardiovascular: Satisfactory opacification of the pulmonary arteries to the segmental level. No evidence of pulmonary embolism. Mild cardiomegaly. Small pericardial effusion. Coronary artery calcifications. Atherosclerotic calcifications of the aortic arch. Mediastinum/Nodes: No mediastinal, hilar, or axillary lymphadenopathy. Subcentimeter hypodense right thyroid nodule.  Esophagus is unremarkable. The trachea is unremarkable. Lungs/Pleura: Moderate right and small left pleural effusions. Bibasilar atelectasis. Mild centrilobular and paraseptal emphysema, mid apical predominant. There is a solid, 1.4 cm pulmonary nodule in the superior segment of the left lower lobe (series 6, image 35). Mild interlobular septal thickening and dependent ground-glass, mid to apical predominant. Musculoskeletal: There is a subacute-chronic right anterior ninth rib fracture without evidence of underlying lesion. No acute osseous abnormality. No suspicious lytic or blastic lesions. Review of the MIP images confirms the above findings. CT ABDOMEN and PELVIS FINDINGS Hepatobiliary: There is a low-density lesion in the inferior right hepatic lobe, density consistent with simple cyst. The gallbladder is nondilated. There is a visible calcified intraluminal stone. Pancreas: Unremarkable. No pancreatic ductal  dilatation or surrounding inflammatory changes. Spleen: Normal in size without focal abnormality. Adrenals/Urinary Tract: There is a heterogeneous left adrenal mass measuring 3.1 x 2.6 cm (series 3, image 26). Mild right adrenal thickening without discrete nodule. No hydronephrosis or nephrolithiasis. There is a small right upper pole renal cyst. Bladder is unremarkable. Stomach/Bowel: There is a mass of the mid transverse colon with heterogeneous enhancement and marked wall thickening. This measures at least 7.4 x 6.1 x 5.3 cm (coronal image 16, axial image 36). This results in narrowing of the colon colonic lumen. There is adjacent stranding and abutment of the anterior abdominal wall is possibly involve (sagittal image 77).There is no evidence of bowel obstruction. Colonic diverticulosis. The stomach appears unremarkable. Vascular/Lymphatic: Aortoiliac atherosclerotic calcifications. No AAA. No lymphadenopathy. Reproductive: Unremarkable. Other: Prior ventral hernia repair. Small volume abdominopelvic ascites. Musculoskeletal: No acute osseous abnormality. No suspicious lytic or blastic lesions. Multilevel degenerative changes of the spine. Review of the MIP images confirms the above findings. IMPRESSION: Large heterogeneous mass in the mid transverse colon measuring at least 7.4 x 6.1 x 5.3 cm concerning for malignancy. Adjacent peritoneal stranding and small volume abdominopelvic ascites. This mass abuts the anterior abdominal wall and is possibly involved. Heterogeneous 3.1 x 2.6 cm left adrenal mass, concerning for a metastasis. Solid 1.4 cm pulmonary nodule in the superior segment of the left lower lobe, concerning for metastasis. Moderate right and small left pleural effusions with adjacent atelectasis. Mild interstitial pulmonary edema. Cardiomegaly. Small pericardial effusion. No evidence of pulmonary embolism. Electronically Signed   By: Maurine Simmering M.D.   On: 02/03/2022 15:28   ECHOCARDIOGRAM  COMPLETE  Result Date: 01/30/2022    ECHOCARDIOGRAM REPORT   Patient Name:   Jessica Stout Date of Exam: 01/30/2022 Medical Rec #:  [id number]   Height:       64.0 in Accession #:    [id number]  Weight:       159.0 lb Date of Birth:  09-29-1951   BSA:          1.775 m Patient Age:    13 years    BP:           141/72 mmHg Patient Gender: F           HR:           58 bpm. Exam Location:  [facility] Procedure: 2D Echo, Cardiac Doppler and Color Doppler Indications:     CHF I50.9  History:         Patient has no prior history of Echocardiogram examinations. No                  past medical history on file.  Sonographer:     Sherrie Sport Referring  Phys:  [id number] Emeterio Reeve Diagnosing Phys: Isaias Cowman MD  Sonographer Comments: Suboptimal apical window. IMPRESSIONS  1. Left ventricular ejection fraction, by estimation, is 45 to 50%. The left ventricle has mildly decreased function. The left ventricle has no regional wall motion abnormalities. There is mild left ventricular hypertrophy. Left ventricular diastolic parameters are indeterminate.  2. Right ventricular systolic function is normal. The right ventricular size is normal.  3. The mitral valve is normal in structure. Mild to moderate mitral valve regurgitation. No evidence of mitral stenosis.  4. The aortic valve is normal in structure. Aortic valve regurgitation is not visualized. No aortic stenosis is present.  5. The inferior vena cava is normal in size with greater than 50% respiratory variability, suggesting right atrial pressure of 3 mmHg. FINDINGS  Left Ventricle: Left ventricular ejection fraction, by estimation, is 45 to 50%. The left ventricle has mildly decreased function. The left ventricle has no regional wall motion abnormalities. The left ventricular internal cavity size was normal in size. There is mild left ventricular hypertrophy. Left ventricular diastolic parameters are indeterminate. Right Ventricle: The right ventricular size is normal. No  increase in right ventricular wall thickness. Right ventricular systolic function is normal. Left Atrium: Left atrial size was normal in size. Right Atrium: Right atrial size was normal in size. Pericardium: There is no evidence of pericardial effusion. Mitral Valve: The mitral valve is normal in structure. Mild to moderate mitral valve regurgitation. No evidence of mitral valve stenosis. MV peak gradient, 6.7 mmHg. The mean mitral valve gradient is 2.0 mmHg. Tricuspid Valve: The tricuspid valve is normal in structure. Tricuspid valve regurgitation is mild . No evidence of tricuspid stenosis. Aortic Valve: The aortic valve is normal in structure. Aortic valve regurgitation is not visualized. No aortic stenosis is present. Aortic valve mean gradient measures 1.0 mmHg. Aortic valve peak gradient measures 2.2 mmHg. Aortic valve area, by VTI measures 2.46 cm. Pulmonic Valve: The pulmonic valve was normal in structure. Pulmonic valve regurgitation is not visualized. No evidence of pulmonic stenosis. Aorta: The aortic root is normal in size and structure. Venous: The inferior vena cava is normal in size with greater than 50% respiratory variability, suggesting right atrial pressure of 3 mmHg. IAS/Shunts: No atrial level shunt detected by color flow Doppler.  LEFT VENTRICLE PLAX 2D LVIDd:         5.10 cm   Diastology LVIDs:         3.90 cm   LV e' medial:    3.26 cm/s LV PW:         1.40 cm   LV E/e' medial:  19.4 LV IVS:        1.20 cm   LV e' lateral:   4.68 cm/s LVOT diam:     2.00 cm   LV E/e' lateral: 13.5 LV SV:         28 LV SV Index:   16 LVOT Area:     3.14 cm  LEFT ATRIUM              Index        RIGHT ATRIUM           Index LA diam:        4.60 cm  2.59 cm/m   RA Area:     22.60 cm LA Vol (A2C):   87.2 ml  49.14 ml/m  RA Volume:   75.90 ml  42.77 ml/m LA Vol (A4C):   111.0 ml 62.55 ml/m  LA Biplane Vol: 99.9 ml  56.29 ml/m  AORTIC VALVE                    PULMONIC VALVE AV Area (Vmax):    2.17 cm     PV  Vmax:        0.85 m/s AV Area (Vmean):   2.06 cm     PV Vmean:       55.700 cm/s AV Area (VTI):     2.46 cm     PV VTI:         0.107 m AV Vmax:           74.60 cm/s   PV Peak grad:   2.9 mmHg AV Vmean:          52.350 cm/s  PV Mean grad:   1.0 mmHg AV VTI:            0.114 m      RVOT Peak grad: 1 mmHg AV Peak Grad:      2.2 mmHg AV Mean Grad:      1.0 mmHg LVOT Vmax:         51.60 cm/s LVOT Vmean:        34.300 cm/s LVOT VTI:          0.089 m LVOT/AV VTI ratio: 0.78  AORTA Ao Root diam: 3.05 cm MITRAL VALVE MV Area (PHT): 6.43 cm     SHUNTS MV Area VTI:   1.45 cm     Systemic VTI:  0.09 m MV Peak grad:  6.7 mmHg     Systemic Diam: 2.00 cm MV Mean grad:  2.0 mmHg     Pulmonic VTI:  0.067 m MV Vmax:       1.29 m/s MV Vmean:      65.2 cm/s MV Decel Time: 118 msec MV E velocity: 63.40 cm/s MV A velocity: 112.00 cm/s MV E/A ratio:  0.57 Isaias Cowman MD Electronically signed by Isaias Cowman MD Signature Date/Time: 01/30/2022/4:37:47 PM    Final    (Echo, Carotid, EGD, Colonoscopy, ERCP)    Subjective: Pt c/o malaise    Discharge Exam: Vitals:   02/05/22 0830 02/05/22 1137  BP: 136/66 119/69  Pulse: 70 74  Resp: 18 18  Temp: 97.6 F (36.4 C) 97.9 F (36.6 C)  SpO2: 93% 95%   Vitals:   02/05/22 0323 02/05/22 0500 02/05/22 0830 02/05/22 1137  BP: 128/71  136/66 119/69  Pulse: 68  70 74  Resp: 18  18 18   Temp: (!) 97.3 F (36.3 C)  97.6 F (36.4 C) 97.9 F (36.6 C)  TempSrc:      SpO2: 95%  93% 95%  Weight:  66.7 kg    Height:        General: Pt is alert, awake, not in acute distress Cardiovascular: S1/S2 +, no rubs, no gallops Respiratory: CTA bilaterally, no wheezing, no rhonchi Abdominal: Soft, NT, ND, bowel sounds + Extremities: no edema, no cyanosis    The results of significant diagnostics from this hospitalization (including imaging, microbiology, ancillary and laboratory) are listed below for reference.     Microbiology: Recent Results (from the past 240  hour(s))  Resp Panel by RT-PCR (Flu A&B, Covid) Nasopharyngeal Swab     Status: None   Collection Time: 01/29/22  7:42 PM   Specimen: Nasopharyngeal Swab; Nasopharyngeal(NP) swabs in vial transport medium  Result Value Ref Range Status   SARS Coronavirus 2 by RT PCR NEGATIVE NEGATIVE  Final    Comment: (NOTE) SARS-CoV-2 target nucleic acids are NOT DETECTED.  The SARS-CoV-2 RNA is generally detectable in upper respiratory specimens during the acute phase of infection. The lowest concentration of SARS-CoV-2 viral copies this assay can detect is 138 copies/mL. A negative result does not preclude SARS-Cov-2 infection and should not be used as the sole basis for treatment or other patient management decisions. A negative result may occur with  improper specimen collection/handling, submission of specimen other than nasopharyngeal swab, presence of viral mutation(s) within the areas targeted by this assay, and inadequate number of viral copies(<138 copies/mL). A negative result must be combined with clinical observations, patient history, and epidemiological information. The expected result is Negative.  Fact Sheet for Patients:  EntrepreneurPulse.com.au  Fact Sheet for Healthcare Providers:  IncredibleEmployment.be  This test is no t yet approved or cleared by the [place] FDA and  has been authorized for detection and/or diagnosis of SARS-CoV-2 by FDA under an Emergency Use Authorization (EUA). This EUA will remain  in effect (meaning this test can be used) for the duration of the COVID-19 declaration under Section 564(b)(1) of the Act, 21 U.S.C.section 360bbb-3(b)(1), unless the authorization is terminated  or revoked sooner.       Influenza A by PCR NEGATIVE NEGATIVE Final   Influenza B by PCR NEGATIVE NEGATIVE Final    Comment: (NOTE) The Xpert Xpress SARS-CoV-2/FLU/RSV plus assay is intended as an aid in the diagnosis of influenza from  Nasopharyngeal swab specimens and should not be used as a sole basis for treatment. Nasal washings and aspirates are unacceptable for Xpert Xpress SARS-CoV-2/FLU/RSV testing.  Fact Sheet for Patients: EntrepreneurPulse.com.au  Fact Sheet for Healthcare Providers: IncredibleEmployment.be  This test is not yet approved or cleared by the [place] FDA and has been authorized for detection and/or diagnosis of SARS-CoV-2 by FDA under an Emergency Use Authorization (EUA). This EUA will remain in effect (meaning this test can be used) for the duration of the COVID-19 declaration under Section 564(b)(1) of the Act, 21 U.S.C. section 360bbb-3(b)(1), unless the authorization is terminated or revoked.  Performed at [facility], [address] [place], [place] [address]      Labs: BNP (last 3 results) Recent Labs    01/23/22 1519  BNP 9,518.8*   Basic Metabolic Panel: Recent Labs  Lab 01/31/22 0433 02/01/22 0526 02/02/22 0520 02/03/22 0522 02/04/22 0658 02/05/22 0628  NA 143 144 141 139 141 142  K 3.4* 3.3* 4.0 4.0 3.8 3.5  CL 102 103 101 101 100 100  CO2 33* 32 31 30 31  32  GLUCOSE 105* 125* 124* 122* 129* 137*  BUN 22 20 16 17 17 16   CREATININE 0.77 0.48 0.54 0.76 0.74 0.65  CALCIUM 8.5* 8.5* 8.9 8.7* 8.8* 8.9  MG 1.7  --  1.9  --   --  1.7   Liver Function Tests: Recent Labs  Lab 02/04/22 0658  AST 14*  ALT 12  ALKPHOS 46  BILITOT 0.5  PROT 5.9*  ALBUMIN 2.9*   No results for input(s): LIPASE, AMYLASE in the last 168 hours. No results for input(s): AMMONIA in the last 168 hours. CBC: Recent Labs  Lab 01/30/22 0524 02/01/22 0526 02/02/22 0520 02/03/22 0522 02/04/22 0658  WBC 9.4 6.9 7.7 6.4 8.8  HGB 9.2* 9.0* 9.0* 9.0* 9.2*  HCT 32.4* 31.6* 31.7* 31.1* 32.2*  MCV 78.8* 77.3* 77.3* 78.3* 78.7*  PLT 271 271 298 274 289   Cardiac Enzymes: No results  for input(s): CKTOTAL, CKMB, CKMBINDEX, TROPONINI in  the last 168 hours. BNP: Invalid input(s): POCBNP CBG: Recent Labs  Lab 02/04/22 1208 02/04/22 1618 02/04/22 2109 02/05/22 0737 02/05/22 1135  GLUCAP 130* 97 184* 134* 114*   D-Dimer No results for input(s): DDIMER in the last 72 hours. Hgb A1c No results for input(s): HGBA1C in the last 72 hours. Lipid Profile No results for input(s): CHOL, HDL, LDLCALC, TRIG, CHOLHDL, LDLDIRECT in the last 72 hours. Thyroid function studies No results for input(s): TSH, T4TOTAL, T3FREE, THYROIDAB in the last 72 hours.  Invalid input(s): FREET3 Anemia work up No results for input(s): VITAMINB12, FOLATE, FERRITIN, TIBC, IRON, RETICCTPCT in the last 72 hours. Urinalysis No results found for: COLORURINE, APPEARANCEUR, [place], [place], GLUCOSEU, [place], [place], [place], PROTEINUR, UROBILINOGEN, NITRITE, LEUKOCYTESUR Sepsis Labs Invalid input(s): PROCALCITONIN,  WBC,  LACTICIDVEN Microbiology Recent Results (from the past 240 hour(s))  Resp Panel by RT-PCR (Flu A&B, Covid) Nasopharyngeal Swab     Status: None   Collection Time: 01/29/22  7:42 PM   Specimen: Nasopharyngeal Swab; Nasopharyngeal(NP) swabs in vial transport medium  Result Value Ref Range Status   SARS Coronavirus 2 by RT PCR NEGATIVE NEGATIVE Final    Comment: (NOTE) SARS-CoV-2 target nucleic acids are NOT DETECTED.  The SARS-CoV-2 RNA is generally detectable in upper respiratory specimens during the acute phase of infection. The lowest concentration of SARS-CoV-2 viral copies this assay can detect is 138 copies/mL. A negative result does not preclude SARS-Cov-2 infection and should not be used as the sole basis for treatment or other patient management decisions. A negative result may occur with  improper specimen collection/handling, submission of specimen other than nasopharyngeal swab, presence of viral mutation(s) within the areas targeted by this assay, and inadequate number of viral copies(<138 copies/mL). A  negative result must be combined with clinical observations, patient history, and epidemiological information. The expected result is Negative.  Fact Sheet for Patients:  EntrepreneurPulse.com.au  Fact Sheet for Healthcare Providers:  IncredibleEmployment.be  This test is no t yet approved or cleared by the [place] FDA and  has been authorized for detection and/or diagnosis of SARS-CoV-2 by FDA under an Emergency Use Authorization (EUA). This EUA will remain  in effect (meaning this test can be used) for the duration of the COVID-19 declaration under Section 564(b)(1) of the Act, 21 U.S.C.section 360bbb-3(b)(1), unless the authorization is terminated  or revoked sooner.       Influenza A by PCR NEGATIVE NEGATIVE Final   Influenza B by PCR NEGATIVE NEGATIVE Final    Comment: (NOTE) The Xpert Xpress SARS-CoV-2/FLU/RSV plus assay is intended as an aid in the diagnosis of influenza from Nasopharyngeal swab specimens and should not be used as a sole basis for treatment. Nasal washings and aspirates are unacceptable for Xpert Xpress SARS-CoV-2/FLU/RSV testing.  Fact Sheet for Patients: EntrepreneurPulse.com.au  Fact Sheet for Healthcare Providers: IncredibleEmployment.be  This test is not yet approved or cleared by the [place] FDA and has been authorized for detection and/or diagnosis of SARS-CoV-2 by FDA under an Emergency Use Authorization (EUA). This EUA will remain in effect (meaning this test can be used) for the duration of the COVID-19 declaration under Section 564(b)(1) of the Act, 21 U.S.C. section 360bbb-3(b)(1), unless the authorization is terminated or revoked.  Performed at [facility], [address] [place], [place] [address]      Time coordinating discharge: Over 30 minutes  SIGNED:   Wyvonnia Dusky, MD  [facility] 02/05/2022, 2:19  PM Pager   If  7PM-7AM, please contact night-coverage

## 2022-02-06 NOTE — Progress Notes (Signed)
Patient ID: Jessica Stout, female    DOB: 11/03/1951, 71 y.o.   MRN: [id number]  HPI  Ms Jessica Stout is a 71 y/o female with a history of asthma, colon cancer, DM, hyperlipidemia, HTN, stroke, anemia, anxiety, COPD, recent tobacco use and chronic heart failure.   Echo report from 01/30/22 reviewed and showed an EF of 45-50% along with mild LVH and mild/moderate MR.   Admitted 01/29/22 due to SOB and pedal edema due to new onset HF. Initially given IV lasix with transition to oral diuretics. Cardiology, GI, oncology & surgical consults obtained.  Colonoscopy and EGD pursued 3/6 with results showing duodenal ulcer, erosive gastropathy, and colon mass. CT revealed pulmonary nodule and left adrenal lesion. IV iron given due to anemia. Discharged after 7 days.   She presents for her initial visit with a chief complaint of moderate fatigue with little exertion. She describes this as chronic in nature. She has associated decreased appetite, cough, shortness of breath and left leg weakness along with this. She denies any difficulty sleeping (although sleeps more during the day), dizziness, abdominal distention, palpitations, pedal edema, chest pain, wheezing or weight gain.   Weighing daily and reports a stable weight. Not adding any salt to her food. Currently being worked up for her recent colon cancer diagnosis.   Past Medical History:  Diagnosis Date   Anemia    Anxiety    Asthma    Cancer ([place])    colon   CHF (congestive heart failure) (H[facility])    COPD (chronic obstructive pulmonary disease) ([place])    Diabetes mellitus without complication ([place])    Hyperlipidemia    Hypertension    Stroke [facility])    Past Surgical History:  Procedure Laterality Date   COLONOSCOPY WITH PROPOFOL N/A 02/03/2022   Procedure: COLONOSCOPY WITH PROPOFOL;  Surgeon: Lin Landsman, MD;  Location: [facility] ENDOSCOPY;  Service: Gastroenterology;  Laterality: N/A;   ESOPHAGOGASTRODUODENOSCOPY (EGD) WITH PROPOFOL N/A 02/03/2022    Procedure: ESOPHAGOGASTRODUODENOSCOPY (EGD) WITH PROPOFOL;  Surgeon: Lin Landsman, MD;  Location: [facility] ENDOSCOPY;  Service: Gastroenterology;  Laterality: N/A;   GIVENS CAPSULE STUDY N/A 02/03/2022   Procedure: GIVENS CAPSULE STUDY;  Surgeon: Lin Landsman, MD;  Location: [facility] ENDOSCOPY;  Service: Gastroenterology;  Laterality: N/A;  possible Capsule Study   History reviewed. No pertinent family history. Social History   Tobacco Use   Smoking status: Former    Types: Cigarettes    Quit date: 01/01/2022    Years since quitting: 0.1   Smokeless tobacco: Not on file  Substance Use Topics   Alcohol use: Not on file   Allergies  Allergen Reactions   Codeine Rash    Happened in 1970s   Prior to Admission medications   Medication Sig Start Date End Date Taking? Authorizing Provider  acetaminophen (TYLENOL) 325 MG tablet Take 650 mg by mouth every 6 (six) hours as needed.   Yes [provider]  albuterol (VENTOLIN HFA) 108 (90 Base) MCG/ACT inhaler Inhale into the lungs. 01/19/22  Yes [provider]  atorvastatin (LIPITOR) 40 MG tablet Take 40 mg by mouth daily. 12/12/21  Yes [provider]  furosemide (LASIX) 40 MG tablet Take 40 mg by mouth daily. 01/23/22  Yes [provider]  gabapentin (NEURONTIN) 600 MG tablet Take by mouth 2 (two) times daily. 01/01/22  Yes [provider]  JANUMET 50-1000 MG tablet Take 1 tablet by mouth daily. 11/24/21  Yes [provider]  LANTUS SOLOSTAR 100 UNIT/ML Solostar  Pen Inject 12 Units into the skin at bedtime. 01/17/22  Yes [provider]  losartan (COZAAR) 25 MG tablet Take 25 mg by mouth daily. 12/12/21  Yes [provider]  metoprolol succinate (TOPROL-XL) 25 MG 24 hr tablet Take 1 tablet (25 mg total) by mouth daily. 02/06/22 03/08/22 Yes Wyvonnia Dusky, MD  pantoprazole (PROTONIX) 40 MG tablet Take 1 tablet (40 mg total) by mouth daily. 02/05/22 03/07/22 Yes Wyvonnia Dusky, MD  venlafaxine XR (EFFEXOR-XR) 75 MG 24 hr capsule Take 75 mg by mouth daily. 12/07/21  Yes [provider]  fenofibrate 54 MG tablet Take 54 mg by mouth daily. Patient not taking: Reported on 02/07/2022 12/12/21   [provider]  traMADol (ULTRAM) 50 MG tablet Take 50 mg by mouth 4 (four) times daily as needed. Patient not taking: Reported on 02/07/2022 01/20/22   [provider]   Review of Systems  Constitutional:  Positive for appetite change (decreased) and fatigue (easily).  HENT:  Negative for congestion, postnasal drip and sore throat.   Eyes: Negative.   Respiratory:  Positive for cough (intermittent) and shortness of breath (wearing 2L oxygen). Negative for wheezing.   Cardiovascular:  Negative for chest pain, palpitations and leg swelling.  Gastrointestinal:  Negative for abdominal distention and abdominal pain.  Endocrine: Negative.   Genitourinary: Negative.   Musculoskeletal:  Negative for back pain and neck pain.  Skin: Negative.   Allergic/Immunologic: Negative.   Neurological:  Positive for weakness (left leg). Negative for dizziness and light-headedness.  Hematological:  Negative for adenopathy. Does not bruise/bleed easily.  Psychiatric/Behavioral:  Negative for dysphoric mood and sleep disturbance (sleeping more during the day; sleeping on 1 pillow). The patient is not nervous/anxious.    Vitals:   02/07/22 1156  BP: (!) 90/47  Pulse: 81  Resp: 14  SpO2: 100%  Weight: 146 lb (66.2 kg)  Height: 5\' 4"  (1.626 m)   Wt Readings from Last 3 Encounters:  02/07/22 146 lb (66.2 kg)  02/05/22 147 lb 1.6 oz (66.7 kg)   Lab Results  Component Value Date   CREATININE 0.65 02/05/2022   CREATININE 0.74 02/04/2022   CREATININE 0.76 02/03/2022   Physical Exam Vitals and nursing note reviewed. Exam conducted with a chaperone present (husband).  Constitutional:      Appearance: Normal appearance.  HENT:     Head: Normocephalic and atraumatic.   Cardiovascular:     Rate and Rhythm: Normal rate and regular rhythm.  Pulmonary:     Effort: Pulmonary effort is normal. No respiratory distress.     Breath sounds: No wheezing or rales.  Abdominal:     General: There is no distension.     Palpations: Abdomen is soft.     Tenderness: There is no abdominal tenderness.  Musculoskeletal:        General: No tenderness.     Cervical back: Normal range of motion and neck supple.     Right lower leg: Edema (1+ pitting) present.     Left lower leg: Edema (1+ pitting) present.  Skin:    General: Skin is warm and dry.  Neurological:     General: No focal deficit present.     Mental Status: She is alert and oriented to person, place, and time.  Psychiatric:        Mood and Affect: Mood normal.        Behavior: Behavior normal.        Thought Content: Thought content  normal.   Assessment & Plan:  1: Chronic heart failure with mildly reduced ejection fraction- - NYHA class  III - euvolemic  - weighing daily and understands to call for an overnight weight gain of > 2 pounds or a weekly weight gain of > 5 pounds - not adding salt to her food and has been reading food labels for sodium content; low sodium cookbook provided - saw cardiology Corky Sox) 01/23/22; returns in a few weeks - on GDMT of losartan and metoprolol succinate; on janumet - current BP does not allow for entresto or MRA - BNP 01/23/22 was 2004.6  2: HTN- - BP low but patient without dizziness (90/47); furosemide increased last month by cardiology - sees PCP Hall Busing) - BMP 02/05/22 reviewed and showed sodium 142, potassium 3.5, creatinine 0.65 and GFR >60  3: DM- - A1c 01/29/22 was 6.5%  4: COPD- - wearing oxygen at 2L around the clock - quit smoking Jan 2023  5: New colon cancer- - saw GI Jerelene Redden) 01/29/22 - to see surgeon Dahlia Byes) 02/26/22 - waiting to see oncology Grayland Ormond)   6: Lymphedema- - stage 2 - limited in her ability to exercise due to fatigue - does elevate her  legs when sitting for long periods of time - encouraged to wear compression socks daily with removal at bedtime - consider compression boots if edema persists   Medication list reviewed.   Return in 1 month, sooner if needed.

## 2022-02-07 ENCOUNTER — Ambulatory Visit: Payer: Medicare Other | Attending: Family | Admitting: Family

## 2022-02-07 ENCOUNTER — Other Ambulatory Visit: Payer: Self-pay

## 2022-02-07 ENCOUNTER — Encounter: Payer: Self-pay | Admitting: Family

## 2022-02-07 VITALS — BP 90/47 | HR 81 | Resp 14 | Ht 64.0 in | Wt 146.0 lb

## 2022-02-07 DIAGNOSIS — C189 Malignant neoplasm of colon, unspecified: Secondary | ICD-10-CM | POA: Insufficient documentation

## 2022-02-07 DIAGNOSIS — J449 Chronic obstructive pulmonary disease, unspecified: Secondary | ICD-10-CM | POA: Diagnosis not present

## 2022-02-07 DIAGNOSIS — I890 Lymphedema, not elsewhere classified: Secondary | ICD-10-CM

## 2022-02-07 DIAGNOSIS — I110 Hypertensive heart disease with heart failure: Secondary | ICD-10-CM | POA: Insufficient documentation

## 2022-02-07 DIAGNOSIS — E785 Hyperlipidemia, unspecified: Secondary | ICD-10-CM | POA: Insufficient documentation

## 2022-02-07 DIAGNOSIS — Z9981 Dependence on supplemental oxygen: Secondary | ICD-10-CM | POA: Diagnosis not present

## 2022-02-07 DIAGNOSIS — F419 Anxiety disorder, unspecified: Secondary | ICD-10-CM | POA: Insufficient documentation

## 2022-02-07 DIAGNOSIS — E119 Type 2 diabetes mellitus without complications: Secondary | ICD-10-CM

## 2022-02-07 DIAGNOSIS — C184 Malignant neoplasm of transverse colon: Secondary | ICD-10-CM

## 2022-02-07 DIAGNOSIS — I5022 Chronic systolic (congestive) heart failure: Secondary | ICD-10-CM

## 2022-02-07 DIAGNOSIS — Z8673 Personal history of transient ischemic attack (TIA), and cerebral infarction without residual deficits: Secondary | ICD-10-CM | POA: Diagnosis not present

## 2022-02-07 DIAGNOSIS — Z794 Long term (current) use of insulin: Secondary | ICD-10-CM

## 2022-02-07 DIAGNOSIS — R0602 Shortness of breath: Secondary | ICD-10-CM | POA: Insufficient documentation

## 2022-02-07 DIAGNOSIS — I509 Heart failure, unspecified: Secondary | ICD-10-CM | POA: Insufficient documentation

## 2022-02-07 DIAGNOSIS — Z87891 Personal history of nicotine dependence: Secondary | ICD-10-CM | POA: Diagnosis not present

## 2022-02-07 DIAGNOSIS — Z7901 Long term (current) use of anticoagulants: Secondary | ICD-10-CM | POA: Diagnosis not present

## 2022-02-07 DIAGNOSIS — I10 Essential (primary) hypertension: Secondary | ICD-10-CM | POA: Diagnosis not present

## 2022-02-07 NOTE — Patient Instructions (Addendum)
Continue weighing daily and call for an overnight weight gain of 3 pounds or more or a weekly weight gain of more than 5 pounds. ? ? ?If you have voicemail, please make sure your mailbox is cleaned out so that we may leave a message and please make sure to listen to any voicemails.  ? ? ? ?Get compression socks and put them on every day with removal at bedtime ?

## 2022-02-11 ENCOUNTER — Other Ambulatory Visit: Payer: Self-pay | Admitting: Emergency Medicine

## 2022-02-11 DIAGNOSIS — I509 Heart failure, unspecified: Secondary | ICD-10-CM

## 2022-02-12 ENCOUNTER — Other Ambulatory Visit: Payer: Self-pay

## 2022-02-12 ENCOUNTER — Inpatient Hospital Stay (HOSPITAL_BASED_OUTPATIENT_CLINIC_OR_DEPARTMENT_OTHER): Payer: Medicare Other | Admitting: Oncology

## 2022-02-12 ENCOUNTER — Encounter: Payer: Self-pay | Admitting: Oncology

## 2022-02-12 ENCOUNTER — Inpatient Hospital Stay: Payer: Medicare Other | Attending: Oncology

## 2022-02-12 VITALS — BP 135/83 | HR 67 | Wt 146.0 lb

## 2022-02-12 DIAGNOSIS — I110 Hypertensive heart disease with heart failure: Secondary | ICD-10-CM | POA: Diagnosis not present

## 2022-02-12 DIAGNOSIS — Z87891 Personal history of nicotine dependence: Secondary | ICD-10-CM | POA: Diagnosis not present

## 2022-02-12 DIAGNOSIS — C189 Malignant neoplasm of colon, unspecified: Secondary | ICD-10-CM | POA: Diagnosis present

## 2022-02-12 DIAGNOSIS — R911 Solitary pulmonary nodule: Secondary | ICD-10-CM | POA: Diagnosis not present

## 2022-02-12 DIAGNOSIS — E119 Type 2 diabetes mellitus without complications: Secondary | ICD-10-CM | POA: Insufficient documentation

## 2022-02-12 DIAGNOSIS — I509 Heart failure, unspecified: Secondary | ICD-10-CM

## 2022-02-12 DIAGNOSIS — E279 Disorder of adrenal gland, unspecified: Secondary | ICD-10-CM | POA: Diagnosis not present

## 2022-02-12 LAB — CBC WITH DIFFERENTIAL/PLATELET
Abs Immature Granulocytes: 0.03 10*3/uL (ref 0.00–0.07)
Basophils Absolute: 0 10*3/uL (ref 0.0–0.1)
Basophils Relative: 0 %
Eosinophils Absolute: 0.1 10*3/uL (ref 0.0–0.5)
Eosinophils Relative: 1 %
HCT: 40.2 % (ref 36.0–46.0)
Hemoglobin: 11.6 g/dL — ABNORMAL LOW (ref 12.0–15.0)
Immature Granulocytes: 0 %
Lymphocytes Relative: 11 %
Lymphs Abs: 1 10*3/uL (ref 0.7–4.0)
MCH: 23 pg — ABNORMAL LOW (ref 26.0–34.0)
MCHC: 28.9 g/dL — ABNORMAL LOW (ref 30.0–36.0)
MCV: 79.6 fL — ABNORMAL LOW (ref 80.0–100.0)
Monocytes Absolute: 0.8 10*3/uL (ref 0.1–1.0)
Monocytes Relative: 9 %
Neutro Abs: 7 10*3/uL (ref 1.7–7.7)
Neutrophils Relative %: 79 %
Platelets: 256 10*3/uL (ref 150–400)
RBC: 5.05 MIL/uL (ref 3.87–5.11)
RDW: 20.7 % — ABNORMAL HIGH (ref 11.5–15.5)
WBC: 8.9 10*3/uL (ref 4.0–10.5)
nRBC: 0 % (ref 0.0–0.2)

## 2022-02-13 ENCOUNTER — Other Ambulatory Visit: Payer: Self-pay

## 2022-02-13 ENCOUNTER — Telehealth: Payer: Self-pay | Admitting: *Deleted

## 2022-02-13 MED ORDER — OXYCODONE HCL 5 MG PO TABS: 5.0000 mg | ORAL_TABLET | ORAL | 0 refills | Status: DC | PRN

## 2022-02-13 NOTE — Telephone Encounter (Signed)
Patient husband called stating that prescription was to have been sent to  pharmacy yesterday for patient and they have not received one as of yet. He did not say what it was or what it is for. And the note is not done from yesterday. Please advise ?

## 2022-02-14 ENCOUNTER — Telehealth: Payer: Self-pay

## 2022-02-14 NOTE — Progress Notes (Signed)
?[facility]  ?[phone] [phone] ? ?ID: Jessica Stout OB: Dec 18, 1950  MR#: [id number]  EXB#:[id number] ? ?Patient Care Team: ?Albina Billet, MD as PCP - General (Internal Medicine) ?Clent Jacks, RN as Oncology Nurse Navigator ? ?CHIEF COMPLAINT: Colon cancer. ? 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  Given her significant history of abdominal surgeries resection is not accomplished locally and likely needs to be done at a tertiary care center.  She continues to have intermittent abdominal pain, but otherwise feels well.  She has chronic weakness and fatigue.  She has no neurologic complaints.  She denies any recent fevers.  She has a fair appetite, but denies weight loss.  She has no chest pain, shortness of breath, cough, or hemoptysis.  She denies any nausea, vomiting, constipation, or diarrhea.  She has no melena or hematochezia.  Patient offers no further specific complaints today. ? ?REVIEW OF SYSTEMS:   ?Review of Systems  ?Constitutional:  Positive for malaise/fatigue. Negative for fever and weight loss.  ?Respiratory: Negative.  Negative for cough, hemoptysis and shortness of breath.   ?Cardiovascular: Negative.  Negative for chest pain and leg swelling.  ?Gastrointestinal:  Positive for abdominal pain. Negative for blood in stool, constipation, diarrhea, melena, nausea and vomiting.  ?Genitourinary: Negative.  Negative for dysuria.  ?Musculoskeletal: Negative.  Negative for back pain.  ?Skin: Negative.  Negative for rash.  ?Neurological:  Positive for weakness. Negative for dizziness, focal weakness and headaches.  ?Psychiatric/Behavioral: Negative.  The patient is not nervous/anxious.   ? ?As per HPI. Otherwise, a complete review of systems is negative. ? ?PAST MEDICAL HISTORY: ?Past Medical History:  ?Diagnosis Date  ? Anemia   ? Anxiety   ?  Asthma   ? Cancer [facility])   ? colon  ? CHF (congestive heart failure) ([place])   ? COPD (chronic obstructive pulmonary disease) ([place])   ? Diabetes mellitus without complication ([place])   ? Hyperlipidemia   ? Hypertension   ? Stroke [facility])   ? ? ?PAST SURGICAL HISTORY: ?Past Surgical History:  ?Procedure Laterality Date  ? COLONOSCOPY WITH PROPOFOL N/A 02/03/2022  ? Procedure: COLONOSCOPY WITH PROPOFOL;  Surgeon: Lin Landsman, MD;  Location: [facility] ENDOSCOPY;  Service: Gastroenterology;  Laterality: N/A;  ? ESOPHAGOGASTRODUODENOSCOPY (EGD) WITH PROPOFOL N/A 02/03/2022  ? Procedure: ESOPHAGOGASTRODUODENOSCOPY (EGD) WITH PROPOFOL;  Surgeon: Lin Landsman, MD;  Location: [facility] ENDOSCOPY;  Service: Gastroenterology;  Laterality: N/A;  ? GIVENS CAPSULE STUDY N/A 02/03/2022  ? Procedure: GIVENS CAPSULE STUDY;  Surgeon: Lin Landsman, MD;  Location: [facility] ENDOSCOPY;  Service: Gastroenterology;  Laterality: N/A;  possible Capsule Study  ? ? ?FAMILY HISTORY: ?History reviewed. No pertinent family history. ? ?ADVANCED DIRECTIVES (Y/N):  N ? ?HEALTH MAINTENANCE: ?Social History  ? ?Tobacco Use  ? Smoking status: Former  ?  Types: Cigarettes  ?  Quit date: 01/01/2022  ?  Years since quitting: 0.1  ? ? ? Colonoscopy: ? PAP: ? Bone density: ? Lipid panel: ? ?Allergies  ?Allergen Reactions  ? Codeine Rash  ?  Happened in 1970s  ? ? ?Current Outpatient Medications  ?Medication Sig Dispense Refill  ? acetaminophen (TYLENOL) 325 MG tablet Take 650 mg by mouth every 6 (six) hours as needed.    ? albuterol (VENTOLIN HFA) 108 (90 Base) MCG/ACT inhaler Inhale into the lungs.    ? atorvastatin (LIPITOR) 40 MG tablet Take  40 mg by mouth daily.    ? furosemide (LASIX) 40 MG tablet Take 40 mg by mouth daily.    ? gabapentin (NEURONTIN) 600 MG tablet Take by mouth 2 (two) times daily.    ? JANUMET 50-1000 MG tablet Take 1 tablet by mouth daily.    ? LANTUS SOLOSTAR 100 UNIT/ML Solostar Pen Inject 12 Units into the skin at bedtime.    ? losartan  (COZAAR) 25 MG tablet Take 25 mg by mouth daily.    ? metoprolol succinate (TOPROL-XL) 25 MG 24 hr tablet Take 1 tablet (25 mg total) by mouth daily. 30 tablet 0  ? oxyCODONE (OXY IR/ROXICODONE) 5 MG immediate release tablet Take 1 tablet (5 mg total) by mouth every 4 (four) hours as needed for severe pain. 30 tablet 0  ? pantoprazole (PROTONIX) 40 MG tablet Take 1 tablet (40 mg total) by mouth daily. 30 tablet 0  ? venlafaxine XR (EFFEXOR-XR) 75 MG 24 hr capsule Take 75 mg by mouth daily.    ? fenofibrate 54 MG tablet Take 54 mg by mouth daily. (Patient not taking: Reported on 02/07/2022)    ? traMADol (ULTRAM) 50 MG tablet Take 50 mg by mouth 4 (four) times daily as needed. (Patient not taking: Reported on 02/07/2022)    ? ?No current facility-administered medications for this visit.  ? ? ?OBJECTIVE: ?Vitals:  ? 02/12/22 1043  ?BP: 135/83  ?Pulse: 67  ?   Body mass index is 25.06 kg/m?Marland Kitchen    ECOG FS:1 - Symptomatic but completely ambulatory ? ?General: Well-developed, well-nourished, no acute distress. ?Eyes: Pink conjunctiva, anicteric sclera. ?HEENT: Normocephalic, moist mucous membranes. ?Lungs: No audible wheezing or coughing. ?Heart: Regular rate and rhythm. ?Abdomen: Soft, nontender, no obvious distention. ?Musculoskeletal: No edema, cyanosis, or clubbing. ?Neuro: Alert, answering all questions appropriately. Cranial nerves grossly intact. ?Skin: No rashes or petechiae noted. ?Psych: Normal affect. ?Lymphatics: No cervical, calvicular, axillary or inguinal LAD. ? ? ?LAB RESULTS: ? ?Lab Results  ?Component Value Date  ? NA 142 02/05/2022  ? K 3.5 02/05/2022  ? CL 100 02/05/2022  ? CO2 32 02/05/2022  ? GLUCOSE 137 (H) 02/05/2022  ? BUN 16 02/05/2022  ? CREATININE 0.65 02/05/2022  ? CALCIUM 8.9 02/05/2022  ? PROT 5.9 (L) 02/04/2022  ? ALBUMIN 2.9 (L) 02/04/2022  ? AST 14 (L) 02/04/2022  ? ALT 12 02/04/2022  ? ALKPHOS 46 02/04/2022  ? BILITOT 0.5 02/04/2022  ? GFRNONAA >60 02/05/2022  ? ? ?Lab Results  ?Component  Value Date  ? WBC 8.9 02/12/2022  ? NEUTROABS 7.0 02/12/2022  ? HGB 11.6 (L) 02/12/2022  ? HCT 40.2 02/12/2022  ? MCV 79.6 (L) 02/12/2022  ? PLT 256 02/12/2022  ? ? ? ?STUDIES: ?DG Chest 1 View ? ?Result Date: 01/31/2022 ?CLINICAL DATA:  Shortness of breath. EXAM: CHEST  1 VIEW COMPARISON:  Radiographs 01/29/2022. FINDINGS: 0828 hours. Stable cardiac enlargement with small bilateral pleural effusions and bibasilar atelectasis. There is vascular congestion with possible mild edema. No evidence of pneumothorax. The bones appear unremarkable. Telemetry leads overlie the chest. IMPRESSION: Stable examination with cardiomegaly, bilateral pleural effusions and possible mild edema. Electronically Signed   By: Richardean Sale M.D.   On: 01/31/2022 09:47  ? ?DG Chest 2 View ? ?Result Date: 01/29/2022 ?CLINICAL DATA:  Shortness of breath. EXAM: CHEST - 2 VIEW COMPARISON:  None. FINDINGS: Mild cardiomegaly is noted. Mild central pulmonary vascular congestion is noted with probable bibasilar pulmonary edema and pleural effusions with associated atelectasis.  Bony thorax is unremarkable. IMPRESSION: Mild cardiomegaly with central pulmonary vascular congestion and probable bilateral pulmonary edema and atelectasis and pleural effusions. Electronically Signed   By: Marijo Conception M.D.   On: 01/29/2022 11:58  ? ?CT Angio Chest Pulmonary Embolism (PE) W or WO Contrast ? ?Result Date: 02/03/2022 ?CLINICAL DATA:  Pulmonary embolism (PE) suspected, unknown D-dimer; Colon cancer, non-metastatic, staging Metastatic status unknown EXAM: CT ANGIOGRAPHY CHEST CT ABDOMEN AND PELVIS WITH CONTRAST TECHNIQUE: Multidetector CT imaging of the chest was performed using the standard protocol during bolus administration of intravenous contrast. Multiplanar CT image reconstructions and MIPs were obtained to evaluate the vascular anatomy. Multidetector CT imaging of the abdomen and pelvis was performed using the standard protocol during bolus administration  of intravenous contrast. RADIATION DOSE REDUCTION: This exam was performed according to the departmental dose-optimization program which includes automated exposure control, adjustment of the mA and/or

## 2022-02-14 NOTE — Telephone Encounter (Signed)
Called and spoke with Ms. Mcclish. Per Dr. Grayland Ormond she will need to be seen at a tertiary center for surgical opinion. She is understanding and is agreeable. Referral sent to [facility]. She will follow up with Dr. Grayland Ormond following PET. ?

## 2022-02-18 ENCOUNTER — Telehealth: Payer: Self-pay | Admitting: *Deleted

## 2022-02-18 NOTE — Telephone Encounter (Signed)
Alex, PHYSICAL THERAPIST called asking for order approval for PHYSICAL THERAPIST 1 wk 1, 2 wk 4, 1 wk 4. Approval given. He also wanted Dr Grayland Ormond to know that patient is doing well since discharged from hospital with O2 supplementation. ?

## 2022-02-19 ENCOUNTER — Other Ambulatory Visit
Admission: RE | Admit: 2022-02-19 | Discharge: 2022-02-19 | Disposition: A | Discharge status: Home / Self Care | Payer: Medicare Other | Source: Ambulatory Visit | Attending: Cardiology | Admitting: Cardiology

## 2022-02-19 DIAGNOSIS — C189 Malignant neoplasm of colon, unspecified: Secondary | ICD-10-CM | POA: Insufficient documentation

## 2022-02-19 DIAGNOSIS — I509 Heart failure, unspecified: Secondary | ICD-10-CM | POA: Insufficient documentation

## 2022-02-19 DIAGNOSIS — E782 Mixed hyperlipidemia: Secondary | ICD-10-CM | POA: Diagnosis present

## 2022-02-19 LAB — BRAIN NATRIURETIC PEPTIDE: B Natriuretic Peptide: 469.1 pg/mL — ABNORMAL HIGH (ref 0.0–100.0)

## 2022-02-26 ENCOUNTER — Encounter: Payer: Self-pay | Admitting: Surgery

## 2022-02-26 ENCOUNTER — Ambulatory Visit (INDEPENDENT_AMBULATORY_CARE_PROVIDER_SITE_OTHER): Payer: Medicare Other | Admitting: Surgery

## 2022-02-26 ENCOUNTER — Other Ambulatory Visit: Payer: Self-pay

## 2022-02-26 VITALS — BP 101/54 | HR 69 | Ht 64.0 in | Wt 133.2 lb

## 2022-02-26 DIAGNOSIS — C184 Malignant neoplasm of transverse colon: Secondary | ICD-10-CM | POA: Diagnosis not present

## 2022-02-26 NOTE — Patient Instructions (Addendum)
If you have any concerns or questions, please feel free to call our office. Follow up as needed.  ? ?Colorectal Cancer ?Colorectal cancer is a cancerous (malignant) tumor in the colon or rectum, which are parts of the large intestine. A tumor is a mass of cells or tissue. The cancer can spread (metastasize) to other parts of the body. ?What are the causes? ?This condition is usually caused by abnormal growths called polyps on the inner wall of the colon or rectum. Left untreated, these polyps can develop into cancer. Other times, abnormal changes to genes (gene mutations) can cause cells to become cancerous. ?What increases the risk? ?The following factors may make you more likely to develop this condition: ?Being older than age 65. ?Having a personal or family history of colorectal cancer or polyps in your colon. ?Having diabetes, or having had cancer and cancer treatments such as radiation before. ?Having certain hereditary conditions, such as: ?Lynch syndrome. ?Familial adenomatous polyposis. ?Turcot syndrome. ?Peutz-Jeghers syndrome. ?MUTYH-associated polyposis (MAP). ?Being overweight or obese. ?Having a diet that is: ?High in red meats, such as beef, pork, lamb, or liver. ?High in precooked, cured, or other processed meat, such as sausages, meat loaves, and hot dogs. ?Low in fiber, such as fiber found in whole grains, fruits, and vegetables. ?Being inactive (sedentary), smoking, or drinking too much alcohol. ?Having an inflammatory bowel disease, such as ulcerative colitis or Crohn's disease. ?What are the signs or symptoms? ?Early colorectal cancer often does not cause symptoms. As the cancer grows, symptoms may include: ?Changes in bowel habits. ?Feeling like the bowel does not empty completely after a bowel movement. ?Stools (feces) that are narrower than usual, or blood in the stool or toilet after a bowel movement. The blood may be bright red or very dark in color. ?Diarrhea, constipation, or frequent gas  pain. ?Anemia, constant tiredness (fatigue), or nausea and vomiting. ?Discomfort, pain, bloating, fullness, or cramps in the abdomen. ?Unexplained weight loss. ?How is this diagnosed? ?This condition may be diagnosed with: ?A medical history. ?A physical exam. ?Tests. These may include: ?An exam of the rectum using a gloved finger (digital rectal exam). ?A stool test called a fecal occult blood test. ?Blood tests. ?A biopsy. This is removal of a tissue sample from the colon or rectum to be looked at under a microscope. ?You may also have other tests, including: ?X-rays, CT scans, MRIs, or a PET scan. ?A sigmoidoscopy. This test is done to view the inside of the rectum. ?A colonoscopy. This test is done to view the inside of the colon. During this test, small polyps can be removed or biopsies may be taken. ?An endorectal ultrasound. This test checks how deep a tumor in the rectum has grown and whether the cancer has spread to lymph nodes or other nearby tissues. ?Additional tests may be done to find out whether the cancer has spread to other parts of the body (what stage it is). The stages of cancer include: ?Stage 0 - At this stage, the cancer is found only in the innermost lining of the colon or rectum. The tumor has not spread to other tissue. ?Stage 1 (I) - At this stage, the cancer has grown into the inner wall (muscle layer) of the colon or rectum. ?Stage 2 (II) - At this stage, the cancer has grown more deeply into the wall of the colon or rectum or through the wall. It may have invaded nearby tissue or organs. ?Stage 3 (III) - At this stage, the  cancer has spread to nearby lymph nodes or tissue near the lymph nodes. ?Stage 4 (IV) - At this stage, the cancer has spread to other parts of the body that are not near the colon, such as the liver or lungs. ?How is this treated? ?Treatment for this condition depends on the type and stage of the cancer. Treatment may include: ?Surgery. In the early stages of the  cancer, surgery may be done to remove polyps or small tumors from the colon. In later stages, surgery may be done to remove part of the colon (partial colectomy). ?Chemotherapy. This treatment uses medicines to kill cancer cells. ?Targeted therapy. This treatment can kill tumor cells by targeting specific gene mutations or proteins that the cancer expresses. ?Immunotherapy (biologic therapy). This treatment uses your body's disease-fighting system (immune system) to fight the cancer. Substances made by your body or in a laboratory are used to boost, direct, or restore your body's natural defenses against cancer. ?Radiation therapy. This treatment uses radiation to kill cancer cells or shrink tumors. ?Radiofrequency ablation. This treatment uses radio waves to destroy the tumors that may have spread to other areas of the body, such as the liver. ?Follow these instructions at home: ?Take over-the-counter and prescription medicines only as told by your health care provider. ?Try to eat regular, healthy meals. Some of your treatments might affect your appetite. Ask to meet with a dietitian if you are having problems eating or with your appetite. ?Consider joining a support group. This may help you learn about your diagnosis and manage the stress of having colorectal cancer. ?If you are admitted to the hospital, tell your cancer care team. ?Keep all follow-up visits. This is important. ?How is this prevented? ?Colorectal cancer can be prevented with screening tests that find polyps so they can be removed before they develop into cancer. ?All adults should have screening for colorectal cancer starting at age 15 and continuing until age 67. Your health care provider may recommend screening before age 76. People at increased risk should start screening at an earlier age. ?You may be able to help reduce your risk of developing colorectal cancer by staying at a healthy weight, eating a healthy diet, avoiding tobacco and  alcohol use, and being physically active. ?Where to find more information ?American Cancer Society: cancer.org ?[place] cancer.gov ?Contact a health care provider if: ?Your diarrhea or constipation does not go away. ?You have blood in your stool or in the toilet after a bowel movement. ?Your bowel habits change. ?You have increased pain in your abdomen. ?You notice new fatigue or weakness. ?You lose weight without a known reason. ?Get help right away if: ?You have increased bleeding from the rectum. ?You have any uncontrollable or severe abdominal symptoms. ?Summary ?Colorectal cancer is a cancerous (malignant) tumor in the colon or rectum, which are parts of the large intestine. ?Common risk factors for this condition include being older than age 25, having a personal or family history of colorectal cancer or colon polyps, having certain hereditary conditions, or having conditions such as diabetes or inflammatory bowel disease. ?This condition may be diagnosed with tests, such as a colonoscopy and biopsy. ?Treatment depends on the type and stage of the cancer. Often, treatment includes surgery to remove the abnormal tissue, along with chemotherapy, targeted therapy, or immunotherapy. ?Keep all follow-up visits. This is important. ?This information is not intended to replace advice given to you by your health care provider. Make sure you discuss any questions you  have with your health care provider. ?Document Revised: 03/07/2020 Document Reviewed: 03/07/2020 ?Elsevier Patient Education ? [place]. ? ?

## 2022-02-27 ENCOUNTER — Ambulatory Visit (HOSPITAL_COMMUNITY)
Admission: RE | Admit: 2022-02-27 | Discharge: 2022-02-27 | Disposition: A | Discharge status: Home / Self Care | Payer: Medicare Other | Source: Ambulatory Visit | Attending: Oncology | Admitting: Oncology

## 2022-02-27 DIAGNOSIS — R911 Solitary pulmonary nodule: Secondary | ICD-10-CM | POA: Insufficient documentation

## 2022-02-27 DIAGNOSIS — C189 Malignant neoplasm of colon, unspecified: Secondary | ICD-10-CM | POA: Diagnosis present

## 2022-02-27 DIAGNOSIS — K8020 Calculus of gallbladder without cholecystitis without obstruction: Secondary | ICD-10-CM | POA: Diagnosis not present

## 2022-02-27 DIAGNOSIS — I700 Atherosclerosis of aorta: Secondary | ICD-10-CM | POA: Insufficient documentation

## 2022-02-27 LAB — GLUCOSE, CAPILLARY: Glucose-Capillary: 110 mg/dL — ABNORMAL HIGH (ref 70–99)

## 2022-02-27 MED ORDER — FLUDEOXYGLUCOSE F - 18 (FDG) INJECTION
7.0500 | Freq: Once | INTRAVENOUS | Status: AC
  Administered 2022-02-27: 7.05 via INTRAVENOUS

## 2022-02-28 ENCOUNTER — Telehealth: Payer: Self-pay

## 2022-02-28 ENCOUNTER — Other Ambulatory Visit: Payer: Self-pay

## 2022-02-28 DIAGNOSIS — C189 Malignant neoplasm of colon, unspecified: Secondary | ICD-10-CM

## 2022-02-28 DIAGNOSIS — R911 Solitary pulmonary nodule: Secondary | ICD-10-CM

## 2022-02-28 NOTE — Progress Notes (Signed)
Outpatient Surgical Follow Up ? ?02/28/2022 ? ?Jessica Stout is an 71 y.o. female.  ? ?Chief Complaint  ?Patient presents with  ? Hospitalization Follow-up  ?  Colon cancer  ? ? ?HPI: Jessica Stout is a 71 year old female known to me as I saw her in the hospital for recently diagnosed colon mass in the transverse colon.  Please note that I have personally reviewed the endoscopic as well as a CT images.  There is evidence of a transverse colon mass.  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  Eventually require abdominal wall reconstruction at [facility] by Dr. Macy Mis for group.  She does have mesh and has multiple major abdominal scars ? ?She denies any obstructive symptoms.  She is tolerating diet well.  No fevers no chills she is having bowel movements.  She is not having any hematochezia or melena ?Past Medical History:  ?Diagnosis Date  ? Anemia   ? Anxiety   ? Asthma   ? Cancer [facility])   ? colon  ? CHF (congestive heart failure) ([place])   ? COPD (chronic obstructive pulmonary disease) ([place])   ? Diabetes mellitus without complication ([place])   ? Hyperlipidemia   ? Hypertension   ? Stroke [facility])   ? ? ?Past Surgical History:  ?Procedure Laterality Date  ? COLONOSCOPY WITH PROPOFOL N/A 02/03/2022  ? Procedure: COLONOSCOPY WITH PROPOFOL;  Surgeon: Lin Landsman, MD;  Location[facility] ENDOSCOPY;  Service: Gastroenterology;  Laterality: N/A;  ? ESOPHAGOGASTRODUODENOSCOPY (EGD) WITH PROPOFOL N/A 02/03/2022  ? Procedure: ESOPHAGOGASTRODUODENOSCOPY (EGD) WITH PROPOFOL;  Surgeon: Lin Landsman, MD;  Location: [facility] ENDOSCOPY;  Service: Gastroenterology;  Laterality: N/A;  ? GIVENS CAPSULE STUDY N/A 02/03/2022  ? Procedure: GIVENS CAPSULE STUDY;  Surgeon:  Lin Landsman, MD;  Location: [facility] ENDOSCOPY;  Service: Gastroenterology;  Laterality: N/A;  possible Capsule Study  ? ? ?History reviewed. No pertinent family history. ? ?Social History:  reports that she quit smoking about 8 weeks ago. Her smoking use included cigarettes. She does not have any smokeless tobacco history on file. No history on file for alcohol use and drug use. ? ?Allergies:  ?Allergies  ?Allergen Reactions  ? Codeine Rash  ?  Happened in 1970s  ? ? ?Medications reviewed. ? ? ? ?ROS ?Full ROS performed and is otherwise negative other than what is stated in HPI ? ? ?BP (!) 101/54   Pulse 69   Ht 5\' 4"  (1.626 m)   Wt 133 lb 3.2 oz (60.4 kg)   SpO2 99%   BMI 22.86 kg/m?  ? ?Physical Exam ?CONSTITUTIONAL: NAD. ?EYES: Pupils are equal, round, Sclera are non-icteric. ?EARS, NOSE, MOUTH AND THROAT: She is wearing a mask. Marland Kitchen Hearing is intact to voice. ?LYMPH NODES:  Lymph nodes in the neck are normal. ?RESPIRATORY:  Lungs are clear. There is normal respiratory effort, with equal breath sounds bilaterally, and without pathologic use of accessory muscles. ?CARDIOVASCULAR: Heart is regular without murmurs, gallops, or rubs. ?GI: The abdomen is  soft, nontender, Anterior abdominal wall with hard mesh palpated. NO open wounds.  Non distended. There are no palpable masses. There is no hepatosplenomegaly. There are normal bowel sounds in all quadrants. ?GU: Rectal deferred.   ?MUSCULOSKELETAL: Normal muscle strength and tone. No cyanosis  or edema.   ?SKIN: Turgor is good and there are no pathologic skin lesions or ulcers. ?NEUROLOGIC: Motor and sensation is grossly normal. Cranial nerves are grossly intact. ?PSYCH:  Oriented to person, place and time. Affect is normal. ?  ? ? ?NM PET Image Initial (PI) Skull Base To Thigh ? ?Result Date: 02/28/2022 ?CLINICAL DATA:  Initial treatment strategy for colon cancer. EXAM: NUCLEAR MEDICINE PET SKULL BASE TO THIGH TECHNIQUE: 7.05 mCi F-18 FDG was injected  intravenously. Full-ring PET imaging was performed from the skull base to thigh after the radiotracer. CT data was obtained and used for attenuation correction and anatomic localization. Fasting blood glucose: 110 and mg/dl COMPARISON:  CT scan 02/03/2022 FINDINGS: Mediastinal blood pool activity: SUV max 1.73 Liver activity: SUV max NA NECK: No hypermetabolic lymph nodes in the neck. Incidental CT findings: none CHEST: Sub solid 15 mm superior segment left lower lobe pulmonary lesion is hypermetabolic with SUV max of 4.48. Findings consistent with a neoplastic process. This is more suspicious for a primary lung neoplasm than a metastatic focus. No other pulmonary nodules are identified. No enlarged or hypermetabolic mediastinal or hilar lymph nodes. Incidental CT findings: Stable advanced atherosclerotic calcifications involving the aorta and coronary arteries. Stable mild cardiac enlargement. Stable underlying emphysematous changes. ABDOMEN/PELVIS: The 7 cm transverse colon mass is hypermetabolic with SUV max of 18.56 and consistent with known neoplasm. No adjacent hypermetabolic lymphadenopathy. No findings to suggest hepatic metastatic disease. 3.5 cm low-attenuation left adrenal gland lesion is not hypermetabolic and consistent with benign adenoma. No follow-up imaging is recommended. Incidental CT findings: Stable advanced vascular calcifications. Single calcified gallstone is noted in the gallbladder. Surgical changes involving the stomach and colon and anterior abdominal wall with mesh. There is hypermetabolism likely related to prior surgery. SKELETON: No findings suspicious for osseous metastatic disease. Incidental CT findings: none IMPRESSION: 1. Large transverse colon mass is hypermetabolic and consistent with known neoplasm. No findings to suggest abdominal metastatic disease. No hypermetabolic adenopathy or hepatic lesions. 2. Left adrenal gland lesion consistent with a benign adenoma. 3.  Hypermetabolic superior segment left lower lobe lesion could be a partially necrotic solitary metastasis or a primary lung neoplasm (favored). 4. Advanced vascular calcifications. 5. Postoperative changes involving the abdomen/pelvis. 6. Cholelithiasis. 7. Electronically Signed   By: Marijo Sanes M.D.   On: 02/28/2022 11:24   ? 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  It is near obstructing but clinically is not showing any signs of obstruction.  She does have her currently being worked up.  She does have a pending PET/CT and evaluation by colorectal surgery at academic tertiary center.  Discussed with the patient in detail about my thought process.  At this time given the multiple abdominal operations of her abdomen and likely hostility she will likely benefit from an operation at a tertiary facility where all resources are available.  If they decide to start neoadjuvant chemotherapy I will be happy to place her port.  Discussed with the patient and the family in detail.  They seem appreciative.  They understand the situation.  Please note that I spent 40 minutes in this encounter including coordination of her care, personally reviewing imaging studies, placing orders and performing appropriate documentation ? ?Caroleen Hamman, MD FACS ?General Surgeon  ?

## 2022-02-28 NOTE — Telephone Encounter (Signed)
Called and spoke to Jessica Stout regarding her PET scan results. A CT guided biopsy has been ordered and we will contact her once scheduled. She will be seeing Jessica Stout 4/4 and this interferes with her follow up with Jessica Stout. Will try to reschedule follow up with Jessica Stout.  ?

## 2022-03-02 NOTE — Progress Notes (Deleted)
?[place]  ?[phone] [phone] ? ?ID: [name] OB: 11/05/1951  MR#: [id number]  FVC#:[id number] ? ?Patient Care Team: ?Albina Billet, MD as PCP - General (Internal Medicine) ?Clent Jacks, RN as Oncology Nurse Navigator ? ?CHIEF COMPLAINT: Colon cancer. ? 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  Given her significant history of abdominal surgeries resection is not accomplished locally and likely needs to be done at a tertiary care center.  She continues to have intermittent abdominal pain, but otherwise feels well.  She has chronic weakness and fatigue.  She has no neurologic complaints.  She denies any recent fevers.  She has a fair appetite, but denies weight loss.  She has no chest pain, shortness of breath, cough, or hemoptysis.  She denies any nausea, vomiting, constipation, or diarrhea.  She has no melena or hematochezia.  Patient offers no further specific complaints today. ? ?REVIEW OF SYSTEMS:   ?Review of Systems  ?Constitutional:  Positive for malaise/fatigue. Negative for fever and weight loss.  ?Respiratory: Negative.  Negative for cough, hemoptysis and shortness of breath.   ?Cardiovascular: Negative.  Negative for chest pain and leg swelling.  ?Gastrointestinal:  Positive for abdominal pain. Negative for blood in stool, constipation, diarrhea, melena, nausea and vomiting.  ?Genitourinary: Negative.  Negative for dysuria.  ?Musculoskeletal: Negative.  Negative for back pain.  ?Skin: Negative.  Negative for rash.  ?Neurological:  Positive for weakness. Negative for dizziness, focal weakness and headaches.  ?Psychiatric/Behavioral: Negative.  The patient is not nervous/anxious.   ? ?As per HPI. Otherwise, a complete review of systems is negative. ? ?PAST MEDICAL HISTORY: ?Past Medical History:  ?Diagnosis Date  ? Anemia   ? Anxiety   ?  Asthma   ? Cancer [facility])   ? colon  ? CHF (congestive heart failure) ([place])   ? COPD (chronic obstructive pulmonary disease) ([place])   ? Diabetes mellitus without complication ([place])   ? Hyperlipidemia   ? Hypertension   ? Stroke [facility])   ? ? ?PAST SURGICAL HISTORY: ?Past Surgical History:  ?Procedure Laterality Date  ? COLONOSCOPY WITH PROPOFOL N/A 02/03/2022  ? Procedure: COLONOSCOPY WITH PROPOFOL;  Surgeon: Lin Landsman, MD;  Location: [facility] ENDOSCOPY;  Service: Gastroenterology;  Laterality: N/A;  ? ESOPHAGOGASTRODUODENOSCOPY (EGD) WITH PROPOFOL N/A 02/03/2022  ? Procedure: ESOPHAGOGASTRODUODENOSCOPY (EGD) WITH PROPOFOL;  Surgeon: Lin Landsman, MD;  Location: [facility] ENDOSCOPY;  Service: Gastroenterology;  Laterality: N/A;  ? GIVENS CAPSULE STUDY N/A 02/03/2022  ? Procedure: GIVENS CAPSULE STUDY;  Surgeon: Lin Landsman, MD;  Location: [facility] ENDOSCOPY;  Service: Gastroenterology;  Laterality: N/A;  possible Capsule Study  ? ? ?FAMILY HISTORY: ?No family history on file. ? ?ADVANCED DIRECTIVES (Y/N):  N ? ?HEALTH MAINTENANCE: ?Social History  ? ?Tobacco Use  ? Smoking status: Former  ?  Types: Cigarettes  ?  Quit date: 01/01/2022  ?  Years since quitting: 0.1  ? ? ? Colonoscopy: ? PAP: ? Bone density: ? Lipid panel: ? ?Allergies  ?Allergen Reactions  ? Codeine Rash  ?  Happened in 1970s  ? ? ?Current Outpatient Medications  ?Medication Sig Dispense Refill  ? acetaminophen (TYLENOL) 325 MG tablet Take 650 mg by mouth every 6 (six) hours as needed.    ? albuterol (VENTOLIN HFA) 108 (90 Base) MCG/ACT inhaler Inhale into the lungs.    ? atorvastatin (LIPITOR) 40 MG tablet Take 40  mg by mouth daily.    ? fenofibrate 54 MG tablet Take 54 mg by mouth daily.    ? furosemide (LASIX) 40 MG tablet Take 40 mg by mouth daily.    ? gabapentin (NEURONTIN) 600 MG tablet Take by mouth 2 (two) times daily.    ? JANUMET 50-1000 MG tablet Take 1 tablet by mouth daily.    ? LANTUS SOLOSTAR 100 UNIT/ML Solostar Pen Inject 12 Units  into the skin at bedtime.    ? losartan (COZAAR) 25 MG tablet Take 25 mg by mouth daily.    ? metoprolol succinate (TOPROL-XL) 25 MG 24 hr tablet Take 1 tablet (25 mg total) by mouth daily. 30 tablet 0  ? oxyCODONE (OXY IR/ROXICODONE) 5 MG immediate release tablet Take 1 tablet (5 mg total) by mouth every 4 (four) hours as needed for severe pain. 30 tablet 0  ? pantoprazole (PROTONIX) 40 MG tablet Take 1 tablet (40 mg total) by mouth daily. 30 tablet 0  ? traMADol (ULTRAM) 50 MG tablet Take 50 mg by mouth 4 (four) times daily as needed.    ? venlafaxine XR (EFFEXOR-XR) 75 MG 24 hr capsule Take 75 mg by mouth daily.    ? ?No current facility-administered medications for this visit.  ? ? ?OBJECTIVE: ?There were no vitals filed for this visit. ?   There is no height or weight on file to calculate BMI.    ECOG FS:1 - Symptomatic but completely ambulatory ? ?General: Well-developed, well-nourished, no acute distress. ?Eyes: Pink conjunctiva, anicteric sclera. ?HEENT: Normocephalic, moist mucous membranes. ?Lungs: No audible wheezing or coughing. ?Heart: Regular rate and rhythm. ?Abdomen: Soft, nontender, no obvious distention. ?Musculoskeletal: No edema, cyanosis, or clubbing. ?Neuro: Alert, answering all questions appropriately. Cranial nerves grossly intact. ?Skin: No rashes or petechiae noted. ?Psych: Normal affect. ?Lymphatics: No cervical, calvicular, axillary or inguinal LAD. ? ? ?LAB RESULTS: ? ?Lab Results  ?Component Value Date  ? NA 142 02/05/2022  ? K 3.5 02/05/2022  ? CL 100 02/05/2022  ? CO2 32 02/05/2022  ? GLUCOSE 137 (H) 02/05/2022  ? BUN 16 02/05/2022  ? CREATININE 0.65 02/05/2022  ? CALCIUM 8.9 02/05/2022  ? PROT 5.9 (L) 02/04/2022  ? ALBUMIN 2.9 (L) 02/04/2022  ? AST 14 (L) 02/04/2022  ? ALT 12 02/04/2022  ? ALKPHOS 46 02/04/2022  ? BILITOT 0.5 02/04/2022  ? GFRNONAA >60 02/05/2022  ? ? ?Lab Results  ?Component Value Date  ? WBC 8.9 02/12/2022  ? NEUTROABS 7.0 02/12/2022  ? HGB 11.6 (L) 02/12/2022  ?  HCT 40.2 02/12/2022  ? MCV 79.6 (L) 02/12/2022  ? PLT 256 02/12/2022  ? ? ? ?STUDIES: ?DG Chest 1 View ? ?Result Date: 01/31/2022 ?CLINICAL DATA:  Shortness of breath. EXAM: CHEST  1 VIEW COMPARISON:  Radiographs 01/29/2022. FINDINGS: 0828 hours. Stable cardiac enlargement with small bilateral pleural effusions and bibasilar atelectasis. There is vascular congestion with possible mild edema. No evidence of pneumothorax. The bones appear unremarkable. Telemetry leads overlie the chest. IMPRESSION: Stable examination with cardiomegaly, bilateral pleural effusions and possible mild edema. Electronically Signed   By: Richardean Sale M.D.   On: 01/31/2022 09:47  ? ?CT Angio Chest Pulmonary Embolism (PE) W or WO Contrast ? ?Result Date: 02/03/2022 ?CLINICAL DATA:  Pulmonary embolism (PE) suspected, unknown D-dimer; Colon cancer, non-metastatic, staging Metastatic status unknown EXAM: CT ANGIOGRAPHY CHEST CT ABDOMEN AND PELVIS WITH CONTRAST TECHNIQUE: Multidetector CT imaging of the chest was performed using the standard protocol during bolus administration of  intravenous contrast. Multiplanar CT image reconstructions and MIPs were obtained to evaluate the vascular anatomy. Multidetector CT imaging of the abdomen and pelvis was performed using the standard protocol during bolus administration of intravenous contrast. RADIATION DOSE REDUCTION: This exam was performed according to the departmental dose-optimization program which includes automated exposure control, adjustment of the mA and/or kV according to patient size and/or use of iterative reconstruction technique. CONTRAST:  59mL OMNIPAQUE IOHEXOL 350 MG/ML SOLN COMPARISON:  None. FINDINGS: CTA CHEST FINDINGS Cardiovascular: Satisfactory opacification of the pulmonary arteries to the segmental level. No evidence of pulmonary embolism. Mild cardiomegaly. Small pericardial effusion. Coronary artery calcifications. Atherosclerotic calcifications of the aortic arch.  Mediastinum/Nodes: No mediastinal, hilar, or axillary lymphadenopathy. Subcentimeter hypodense right thyroid nodule. Esophagus is unremarkable. The trachea is unremarkable. Lungs/Pleura: Moderate right and small lef

## 2022-03-03 ENCOUNTER — Telehealth: Payer: Self-pay

## 2022-03-03 NOTE — Telephone Encounter (Signed)
Appointment with Dr. Grayland Ormond rescheduled to 4/5 at 1015 so it will not interfere with [facility] colorectal appointment on 4/4. Awaiting lung biopsy to be scheduled with IR. ?

## 2022-03-04 ENCOUNTER — Inpatient Hospital Stay: Payer: Medicare Other | Admitting: Oncology

## 2022-03-04 ENCOUNTER — Inpatient Hospital Stay: Payer: Medicare Other | Admitting: Hospice and Palliative Medicine

## 2022-03-04 DIAGNOSIS — C189 Malignant neoplasm of colon, unspecified: Secondary | ICD-10-CM

## 2022-03-05 ENCOUNTER — Inpatient Hospital Stay (HOSPITAL_BASED_OUTPATIENT_CLINIC_OR_DEPARTMENT_OTHER): Payer: Medicare Other | Admitting: Hospice and Palliative Medicine

## 2022-03-05 ENCOUNTER — Inpatient Hospital Stay: Payer: Medicare Other | Attending: Oncology | Admitting: Oncology

## 2022-03-05 VITALS — BP 128/61 | Temp 97.2°F | Resp 16 | Ht 64.0 in | Wt 131.7 lb

## 2022-03-05 DIAGNOSIS — R911 Solitary pulmonary nodule: Secondary | ICD-10-CM

## 2022-03-05 DIAGNOSIS — I509 Heart failure, unspecified: Secondary | ICD-10-CM | POA: Diagnosis not present

## 2022-03-05 DIAGNOSIS — C189 Malignant neoplasm of colon, unspecified: Secondary | ICD-10-CM | POA: Diagnosis present

## 2022-03-05 DIAGNOSIS — C3432 Malignant neoplasm of lower lobe, left bronchus or lung: Secondary | ICD-10-CM | POA: Insufficient documentation

## 2022-03-05 DIAGNOSIS — E119 Type 2 diabetes mellitus without complications: Secondary | ICD-10-CM | POA: Insufficient documentation

## 2022-03-05 DIAGNOSIS — I110 Hypertensive heart disease with heart failure: Secondary | ICD-10-CM | POA: Insufficient documentation

## 2022-03-05 DIAGNOSIS — F17210 Nicotine dependence, cigarettes, uncomplicated: Secondary | ICD-10-CM | POA: Insufficient documentation

## 2022-03-05 NOTE — Progress Notes (Signed)
Pt has questions about getting lung biopsy.  ?

## 2022-03-05 NOTE — Progress Notes (Signed)
Patient on schedule for LLL Lung biopsy 03/07/2022, called and spoke with patient/husband, with pre procedure instructions given. Made aware to be here @ 0730, NPO after MN night prior to procedure and driver post procedure/recovery/discharge. Will hold am Janumet day of procedure/and 1/2 dose of insulin hs prior to day of procedure. Stated understanding. ?

## 2022-03-05 NOTE — Progress Notes (Signed)
? ?  ?Palliative Medicine ?[place] at [facility] ?Telephone:([phone]) [phone] [phone]) [phone] ? ? ?Name: Jessica Stout ?Date: 03/05/2022 ?MRN: [id number]  ?DOB: 05-13-1951 ? ?Patient Care Team: ?Albina Billet, MD as PCP - General (Internal Medicine) ?Clent Jacks, RN as Oncology Nurse Navigator  ? ? ?REASON FOR CONSULTATION: ?Jessica Stout is a 71 y.o. 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  Palliative care was consulted to address goals. ? ?SOCIAL HISTORY:    ? reports that she quit smoking about 2 months ago. Her smoking use included cigarettes. She does not have any smokeless tobacco history on file. ? ?Patient is married and lives at home with her husband and two cats.  She has a son who lives nearby and is involved.  Patient previously worked as a Regulatory affairs officer. ? ?ADVANCE DIRECTIVES:  ?None on file ? ?CODE STATUS:  ? ?PAST MEDICAL HISTORY: ?Past Medical History:  ?Diagnosis Date  ? Anemia   ? Anxiety   ? Asthma   ? Cancer [facility])   ? colon  ? CHF (congestive heart failure) ([place])   ? COPD (chronic obstructive pulmonary disease) ([place])   ? Diabetes mellitus without complication ([place])   ? Hyperlipidemia   ? Hypertension   ? Stroke [facility])   ? ? ?PAST SURGICAL HISTORY:  ?Past Surgical History:  ?Procedure Laterality Date  ? COLONOSCOPY WITH PROPOFOL N/A 02/03/2022  ? Procedure: COLONOSCOPY WITH PROPOFOL;  Surgeon: Lin Landsman, MD;  Location: [facility] ENDOSCOPY;  Service: Gastroenterology;  Laterality: N/A;  ? ESOPHAGOGASTRODUODENOSCOPY (EGD) WITH PROPOFOL N/A 02/03/2022  ? Procedure: ESOPHAGOGASTRODUODENOSCOPY (EGD) WITH PROPOFOL;  Surgeon: Lin Landsman, MD;  Location: [facility] ENDOSCOPY;  Service: Gastroenterology;   Laterality: N/A;  ? GIVENS CAPSULE STUDY N/A 02/03/2022  ? Procedure: GIVENS CAPSULE STUDY;  Surgeon: Lin Landsman, MD;  Location: [facility] ENDOSCOPY;  Service: Gastroenterology;  Laterality: N/A;  possible Capsule Study  ? ? ?HEMATOLOGY/ONCOLOGY HISTORY:  ?Oncology History  ? No history exists.  ? ? ?ALLERGIES:  is allergic to codeine. ? ?MEDICATIONS:  ?Current Outpatient Medications  ?Medication Sig Dispense Refill  ? acetaminophen (TYLENOL) 325 MG tablet Take 650 mg by mouth every 6 (six) hours as needed.    ? albuterol (VENTOLIN HFA) 108 (90 Base) MCG/ACT inhaler Inhale into the lungs.    ? atorvastatin (LIPITOR) 40 MG tablet Take 40 mg by mouth daily.    ? fenofibrate 54 MG tablet Take 54 mg by mouth daily. (Patient not taking: Reported on 03/05/2022)    ? furosemide (LASIX) 40 MG tablet Take 40 mg by mouth daily.    ? gabapentin (NEURONTIN) 600 MG tablet Take by mouth 2 (two) times daily.    ? JANUMET 50-1000 MG tablet Take 1 tablet by mouth daily.    ? LANTUS SOLOSTAR 100 UNIT/ML Solostar Pen Inject 12 Units into the skin at bedtime.    ? losartan (COZAAR) 25 MG tablet Take 25 mg by mouth daily.    ? metoprolol succinate (TOPROL-XL) 25 MG 24 hr tablet Take 1 tablet (25 mg total) by mouth daily. 30 tablet 0  ? oxyCODONE (OXY IR/ROXICODONE) 5 MG immediate release tablet Take 1 tablet (5 mg total) by mouth every 4 (four) hours as needed for severe pain. (Patient not taking: Reported on 03/05/2022)  30 tablet 0  ? pantoprazole (PROTONIX) 40 MG tablet Take 1 tablet (40 mg total) by mouth daily. 30 tablet 0  ? traMADol (ULTRAM) 50 MG tablet Take 50 mg by mouth 4 (four) times daily as needed. (Patient not taking: Reported on 03/05/2022)    ? venlafaxine XR (EFFEXOR-XR) 75 MG 24 hr capsule Take 75 mg by mouth daily.    ? ?No current facility-administered medications for this visit.  ? ? ?VITAL SIGNS: ?There were no vitals taken for this visit. ?There were no vitals filed for this visit.  ?Estimated body mass index is 22.61  kg/m? as calculated from the following: ?  Height as of an earlier encounter on 03/05/22: 5\' 4"  (1.626 m). ?  Weight as of an earlier encounter on 03/05/22: 131 lb 11.2 oz (59.7 kg). ? ?LABS: ?CBC: ?   ?Component Value Date/Time  ? WBC 8.9 02/12/2022 1013  ? HGB 11.6 (L) 02/12/2022 1013  ? HCT 40.2 02/12/2022 1013  ? PLT 256 02/12/2022 1013  ? MCV 79.6 (L) 02/12/2022 1013  ? NEUTROABS 7.0 02/12/2022 1013  ? LYMPHSABS 1.0 02/12/2022 1013  ? MONOABS 0.8 02/12/2022 1013  ? EOSABS 0.1 02/12/2022 1013  ? BASOSABS 0.0 02/12/2022 1013  ? ?Comprehensive Metabolic Panel: ?   ?Component Value Date/Time  ? NA 142 02/05/2022 0628  ? K 3.5 02/05/2022 0628  ? CL 100 02/05/2022 0628  ? CO2 32 02/05/2022 0628  ? BUN 16 02/05/2022 0628  ? CREATININE 0.65 02/05/2022 0628  ? GLUCOSE 137 (H) 02/05/2022 0454  ? CALCIUM 8.9 02/05/2022 0628  ? AST 14 (L) 02/04/2022 0981  ? ALT 12 02/04/2022 0658  ? ALKPHOS 46 02/04/2022 0658  ? BILITOT 0.5 02/04/2022 0658  ? PROT 5.9 (L) 02/04/2022 1914  ? ALBUMIN 2.9 (L) 02/04/2022 7829  ? ? ?RADIOGRAPHIC STUDIES: ?CT Angio Chest Pulmonary Embolism (PE) W or WO Contrast ? ?Result Date: 02/03/2022 ?CLINICAL DATA:  Pulmonary embolism (PE) suspected, unknown D-dimer; Colon cancer, non-metastatic, staging Metastatic status unknown EXAM: CT ANGIOGRAPHY CHEST CT ABDOMEN AND PELVIS WITH CONTRAST TECHNIQUE: Multidetector CT imaging of the chest was performed using the standard protocol during bolus administration of intravenous contrast. Multiplanar CT image reconstructions and MIPs were obtained to evaluate the vascular anatomy. Multidetector CT imaging of the abdomen and pelvis was performed using the standard protocol during bolus administration of intravenous contrast. RADIATION DOSE REDUCTION: This exam was performed according to the departmental dose-optimization program which includes automated exposure control, adjustment of the mA and/or kV according to patient size and/or use of iterative reconstruction  technique. CONTRAST:  63mL OMNIPAQUE IOHEXOL 350 MG/ML SOLN COMPARISON:  None. FINDINGS: CTA CHEST FINDINGS Cardiovascular: Satisfactory opacification of the pulmonary arteries to the segmental level. No evidence of pulmonary embolism. Mild cardiomegaly. Small pericardial effusion. Coronary artery calcifications. Atherosclerotic calcifications of the aortic arch. Mediastinum/Nodes: No mediastinal, hilar, or axillary lymphadenopathy. Subcentimeter hypodense right thyroid nodule. Esophagus is unremarkable. The trachea is unremarkable. Lungs/Pleura: Moderate right and small left pleural effusions. Bibasilar atelectasis. Mild centrilobular and paraseptal emphysema, mid apical predominant. There is a solid, 1.4 cm pulmonary nodule in the superior segment of the left lower lobe (series 6, image 35). Mild interlobular septal thickening and dependent ground-glass, mid to apical predominant. Musculoskeletal: There is a subacute-chronic right anterior ninth rib fracture without evidence of underlying lesion. No acute osseous abnormality. No suspicious lytic or blastic lesions. Review of the MIP images confirms the above findings. CT ABDOMEN and PELVIS FINDINGS Hepatobiliary: There is a low-density  lesion in the inferior right hepatic lobe, density consistent with simple cyst. The gallbladder is nondilated. There is a visible calcified intraluminal stone. Pancreas: Unremarkable. No pancreatic ductal dilatation or surrounding inflammatory changes. Spleen: Normal in size without focal abnormality. Adrenals/Urinary Tract: There is a heterogeneous left adrenal mass measuring 3.1 x 2.6 cm (series 3, image 26). Mild right adrenal thickening without discrete nodule. No hydronephrosis or nephrolithiasis. There is a small right upper pole renal cyst. Bladder is unremarkable. Stomach/Bowel: There is a mass of the mid transverse colon with heterogeneous enhancement and marked wall thickening. This measures at least 7.4 x 6.1 x 5.3 cm  (coronal image 16, axial image 36). This results in narrowing of the colon colonic lumen. There is adjacent stranding and abutment of the anterior abdominal wall is possibly involve (sagittal image 77).There is no

## 2022-03-05 NOTE — Progress Notes (Signed)
?[place]  ?[phone]) [phone] [phone] ? ?ID: Jessica Stout OB: [birth year]  MR#: [id number]  JYN#:[id number] ? ?Patient Care Team: ?Albina Billet, MD as PCP - General (Internal Medicine) ?Clent Jacks, RN as Oncology Nurse Navigator ? ?CHIEF COMPLAINT: Colon cancer, pulmonary nodule. ? ?INTERVAL HISTORY: Patient returns to clinic today for further evaluation and discussion of her PET scan results.  She continues to have intermittent abdominal pain, but otherwise feels well.  She has chronic weakness and fatigue.  She has no neurologic complaints.  She denies any recent fevers.  She has a fair appetite, but denies weight loss.  She has no chest pain, shortness of breath, cough, or hemoptysis.  She denies any nausea, vomiting, constipation, or diarrhea.  She has no melena or hematochezia.  Patient offers no further specific complaints today. ? ?REVIEW OF SYSTEMS:   ?Review of Systems  ?Constitutional:  Positive for malaise/fatigue. Negative for fever and weight loss.  ?Respiratory: Negative.  Negative for cough, hemoptysis and shortness of breath.   ?Cardiovascular: Negative.  Negative for chest pain and leg swelling.  ?Gastrointestinal:  Positive for abdominal pain. Negative for blood in stool, constipation, diarrhea, melena, nausea and vomiting.  ?Genitourinary: Negative.  Negative for dysuria.  ?Musculoskeletal: Negative.  Negative for back pain.  ?Skin: Negative.  Negative for rash.  ?Neurological:  Positive for weakness. Negative for dizziness, focal weakness and headaches.  ?Psychiatric/Behavioral: Negative.  The patient is not nervous/anxious.   ? ?As per HPI. Otherwise, a complete review of systems is negative. ? ?PAST MEDICAL HISTORY: ?Past Medical History:  ?Diagnosis Date  ? Anemia   ? Anxiety   ? Asthma   ? Cancer [facility])   ? colon  ? CHF (congestive heart failure) ([place])   ? COPD (chronic obstructive pulmonary disease) ([place])   ? Diabetes mellitus without complication  ([place])   ? Hyperlipidemia   ? Hypertension   ? Stroke [facility])   ? ? ?PAST SURGICAL HISTORY: ?Past Surgical History:  ?Procedure Laterality Date  ? COLONOSCOPY WITH PROPOFOL N/A 02/03/2022  ? Procedure: COLONOSCOPY WITH PROPOFOL;  Surgeon: Lin Landsman, MD;  Location: [facility] ENDOSCOPY;  Service: Gastroenterology;  Laterality: N/A;  ? ESOPHAGOGASTRODUODENOSCOPY (EGD) WITH PROPOFOL N/A 02/03/2022  ? Procedure: ESOPHAGOGASTRODUODENOSCOPY (EGD) WITH PROPOFOL;  Surgeon: Lin Landsman, MD;  Location: [facility] ENDOSCOPY;  Service: Gastroenterology;  Laterality: N/A;  ? GIVENS CAPSULE STUDY N/A 02/03/2022  ? Procedure: GIVENS CAPSULE STUDY;  Surgeon: Lin Landsman, MD;  Location: [facility] ENDOSCOPY;  Service: Gastroenterology;  Laterality: N/A;  possible Capsule Study  ? ? ?FAMILY HISTORY: ?No family history on file. ? ?ADVANCED DIRECTIVES (Y/N):  N ? ?HEALTH MAINTENANCE: ?Social History  ? ?Tobacco Use  ? Smoking status: Former  ?  Types: Cigarettes  ?  Quit date: 01/01/2022  ?  Years since quitting: 0.1  ? ? ? Colonoscopy: ? PAP: ? Bone density: ? Lipid panel: ? ?Allergies  ?Allergen Reactions  ? Codeine Rash  ?  Happened in 1970s  ? ? ?Current Outpatient Medications  ?Medication Sig Dispense Refill  ? acetaminophen (TYLENOL) 325 MG tablet Take 650 mg by mouth every 6 (six) hours as needed.    ? albuterol (VENTOLIN HFA) 108 (90 Base) MCG/ACT inhaler Inhale into the lungs.    ? atorvastatin (LIPITOR) 40 MG tablet Take 40 mg by mouth daily.    ? furosemide (LASIX) 40 MG tablet Take 40 mg by mouth daily.    ? gabapentin (NEURONTIN) 600 MG tablet  Take by mouth 2 (two) times daily.    ? JANUMET 50-1000 MG tablet Take 1 tablet by mouth daily.    ? LANTUS SOLOSTAR 100 UNIT/ML Solostar Pen Inject 12 Units into the skin at bedtime.    ? losartan (COZAAR) 25 MG tablet Take 25 mg by mouth daily.    ? metoprolol succinate (TOPROL-XL) 25 MG 24 hr tablet Take 1 tablet (25 mg total) by mouth daily. 30 tablet 0  ? pantoprazole (PROTONIX) 40  MG tablet Take 1 tablet (40 mg total) by mouth daily. 30 tablet 0  ? venlafaxine XR (EFFEXOR-XR) 75 MG 24 hr capsule Take 75 mg by mouth daily.    ? fenofibrate 54 MG tablet Take 54 mg by mouth daily. (Patient not taking: Reported on 03/05/2022)    ? oxyCODONE (OXY IR/ROXICODONE) 5 MG immediate release tablet Take 1 tablet (5 mg total) by mouth every 4 (four) hours as needed for severe pain. (Patient not taking: Reported on 03/05/2022) 30 tablet 0  ? traMADol (ULTRAM) 50 MG tablet Take 50 mg by mouth 4 (four) times daily as needed. (Patient not taking: Reported on 03/05/2022)    ? ?No current facility-administered medications for this visit.  ? ? ?OBJECTIVE: ?Vitals:  ? 03/05/22 1000  ?BP: 128/61  ?Resp: 16  ?Temp: (!) 97.2 ?F (36.2 ?C)  ?SpO2: 98%  ? ?   Body mass index is 22.61 kg/m?Marland Kitchen    ECOG FS:1 - Symptomatic but completely ambulatory ? ?General: Well-developed, well-nourished, no acute distress. ?Eyes: Pink conjunctiva, anicteric sclera. ?HEENT: Normocephalic, moist mucous membranes. ?Lungs: No audible wheezing or coughing. ?Heart: Regular rate and rhythm. ?Abdomen: Soft, nontender, no obvious distention. ?Musculoskeletal: No edema, cyanosis, or clubbing. ?Neuro: Alert, answering all questions appropriately. Cranial nerves grossly intact. ?Skin: No rashes or petechiae noted. ?Psych: Normal affect. ? ?LAB RESULTS: ? ?Lab Results  ?Component Value Date  ? NA 142 02/05/2022  ? K 3.5 02/05/2022  ? CL 100 02/05/2022  ? CO2 32 02/05/2022  ? GLUCOSE 137 (H) 02/05/2022  ? BUN 16 02/05/2022  ? CREATININE 0.65 02/05/2022  ? CALCIUM 8.9 02/05/2022  ? PROT 5.9 (L) 02/04/2022  ? ALBUMIN 2.9 (L) 02/04/2022  ? AST 14 (L) 02/04/2022  ? ALT 12 02/04/2022  ? ALKPHOS 46 02/04/2022  ? BILITOT 0.5 02/04/2022  ? GFRNONAA >60 02/05/2022  ? ? ?Lab Results  ?Component Value Date  ? WBC 8.9 02/12/2022  ? NEUTROABS 7.0 02/12/2022  ? HGB 11.6 (L) 02/12/2022  ? HCT 40.2 02/12/2022  ? MCV 79.6 (L) 02/12/2022  ? PLT 256 02/12/2022   ? ? ? ?STUDIES: ?NM PET Image Initial (PI) Skull Base To Thigh ? ?Result Date: 02/28/2022 ?CLINICAL DATA:  Initial treatment strategy for colon cancer. EXAM: NUCLEAR MEDICINE PET SKULL BASE TO THIGH TECHNIQUE: 7.05 mCi F-18 FDG was injected intravenously. Full-ring PET imaging was performed from the skull base to thigh after the radiotracer. CT data was obtained and used for attenuation correction and anatomic localization. Fasting blood glucose: 110 and mg/dl COMPARISON:  CT scan 02/03/2022 FINDINGS: Mediastinal blood pool activity: SUV max 1.73 Liver activity: SUV max NA NECK: No hypermetabolic lymph nodes in the neck. Incidental CT findings: none CHEST: Sub solid 15 mm superior segment left lower lobe pulmonary lesion is hypermetabolic with SUV max of 8.84. Findings consistent with a neoplastic process. This is more suspicious for a primary lung neoplasm than a metastatic focus. No other pulmonary nodules are identified. No enlarged or hypermetabolic mediastinal or hilar lymph  nodes. Incidental CT findings: Stable advanced atherosclerotic calcifications involving the aorta and coronary arteries. Stable mild cardiac enlargement. Stable underlying emphysematous changes. ABDOMEN/PELVIS: The 7 cm transverse colon mass is hypermetabolic with SUV max of 88.87 and consistent with known neoplasm. No adjacent hypermetabolic lymphadenopathy. No findings to suggest hepatic metastatic disease. 3.5 cm low-attenuation left adrenal gland lesion is not hypermetabolic and consistent with benign adenoma. No follow-up imaging is recommended. Incidental CT findings: Stable advanced vascular calcifications. Single calcified gallstone is noted in the gallbladder. Surgical changes involving the stomach and colon and anterior abdominal wall with mesh. There is hypermetabolism likely related to prior surgery. SKELETON: No findings suspicious for osseous metastatic disease. Incidental CT findings: none IMPRESSION: 1. Large transverse  colon mass is hypermetabolic and consistent with known neoplasm. No findings to suggest abdominal metastatic disease. No hypermetabolic adenopathy or hepatic lesions. 2. Left adrenal gland lesion consistent with a benig

## 2022-03-06 ENCOUNTER — Other Ambulatory Visit: Payer: Self-pay | Admitting: Student

## 2022-03-06 NOTE — H&P (Signed)
? ?Chief Complaint: Patient was seen in consultation today for left lower lobe lung nodule biopsy ? ?Referring Physician(s): [name],[name] ? ?Supervising Physician: Michaelle Birks ? ?Patient Status: [place] ? ?History of Present Illness: ?Jessica Stout is a 71 y.o. female with a past medical history significant for anxiety, DM, anemia, CHF, HTN, HLD, CVA, COPD on supplemental oxygen, colon cancer who presents today for a left lower lobe lung nodule biopsy with moderate sedation. Jessica Stout presented to the ED on 3/1 with complaints of worsening dyspnea, lower extremity swelling and severe fatigue. She was found to have new anemia and underwent EGD/colonocsopy which showed a mass in the transverse colon. Follow up CT chest and CT abdomen/pelvis noted a large heterogeneous mass in the mid transverse colon, a left adrenal mass and a solid 1.4 cm pulmonary nodule in the superior segment of the left lower lobe concerning for metastatic disease. She was discharged on 3/8 with outpatient follow up. She underwent PET scan on 3/31 which showed: ? ?1. Large transverse colon mass is hypermetabolic and consistent with ?known neoplasm. No findings to suggest abdominal metastatic disease. ?No hypermetabolic adenopathy or hepatic lesions. ?2. Left adrenal gland lesion consistent with a benign adenoma. ?3. Hypermetabolic superior segment left lower lobe lesion could be a ?partially necrotic solitary metastasis or a primary lung neoplasm ?(favored). ?4. Advanced vascular calcifications. ?5. Postoperative changes involving the abdomen/pelvis. ?6. Cholelithiasis. ? ? ?IR has been consulted for left lower lobe lung nodule biopsy to further direct care. ? ?Past Medical History:  ?Diagnosis Date  ? Anemia   ? Anxiety   ? Asthma   ? Cancer [facility])   ? colon  ? CHF (congestive heart failure) ([place])   ? COPD (chronic obstructive pulmonary disease) ([place])   ? Diabetes mellitus without complication ([place])   ? Hyperlipidemia   ?  Hypertension   ? Stroke [facility])   ? ? ?Past Surgical History:  ?Procedure Laterality Date  ? COLONOSCOPY WITH PROPOFOL N/A 02/03/2022  ? Procedure: COLONOSCOPY WITH PROPOFOL;  Surgeon: Lin Landsman, MD;  Location: [facility] ENDOSCOPY;  Service: Gastroenterology;  Laterality: N/A;  ? ESOPHAGOGASTRODUODENOSCOPY (EGD) WITH PROPOFOL N/A 02/03/2022  ? Procedure: ESOPHAGOGASTRODUODENOSCOPY (EGD) WITH PROPOFOL;  Surgeon: Lin Landsman, MD;  Location: [facility] ENDOSCOPY;  Service: Gastroenterology;  Laterality: N/A;  ? GIVENS CAPSULE STUDY N/A 02/03/2022  ? Procedure: GIVENS CAPSULE STUDY;  Surgeon: Lin Landsman, MD;  Location: [facility] ENDOSCOPY;  Service: Gastroenterology;  Laterality: N/A;  possible Capsule Study  ? ? ?Allergies: ?Codeine ? ?Medications: ?Prior to Admission medications   ?Medication Sig Start Date End Date Taking? Authorizing Provider  ?acetaminophen (TYLENOL) 325 MG tablet Take 650 mg by mouth every 6 (six) hours as needed.    [provider]  ?albuterol (VENTOLIN HFA) 108 (90 Base) MCG/ACT inhaler Inhale into the lungs. 01/19/22   [provider]  ?atorvastatin (LIPITOR) 40 MG tablet Take 40 mg by mouth daily. 12/12/21   [provider]  ?fenofibrate 54 MG tablet Take 54 mg by mouth daily. ?Patient not taking: Reported on 03/05/2022 12/12/21   [provider]  ?furosemide (LASIX) 40 MG tablet Take 40 mg by mouth daily. 01/23/22   [provider]  ?gabapentin (NEURONTIN) 600 MG tablet Take by mouth 2 (two) times daily. 01/01/22   [provider]  ?JANUMET 50-1000 MG tablet Take 1 tablet by mouth daily. 11/24/21   [provider]  ?LANTUS SOLOSTAR 100 UNIT/ML Solostar Pen Inject 12 Units into the skin  at bedtime. 01/17/22   [provider]  ?losartan (COZAAR) 25 MG tablet Take 25 mg by mouth daily. 12/12/21   [provider]  ?metoprolol succinate (TOPROL-XL) 25 MG 24 hr tablet Take 1 tablet (25 mg total) by mouth daily. 02/06/22 03/08/22   Wyvonnia Dusky, MD  ?oxyCODONE (OXY IR/ROXICODONE) 5 MG immediate release tablet Take 1 tablet (5 mg total) by mouth every 4 (four) hours as needed for severe pain. ?Patient not taking: Reported on 03/05/2022 02/13/22   Lloyd Huger, MD  ?pantoprazole (PROTONIX) 40 MG tablet Take 1 tablet (40 mg total) by mouth daily. 02/05/22 03/07/22  Wyvonnia Dusky, MD  ?traMADol Veatrice Bourbon) 50 MG tablet Take 50 mg by mouth 4 (four) times daily as needed. ?Patient not taking: Reported on 03/05/2022 01/20/22   [provider]  ?venlafaxine XR (EFFEXOR-XR) 75 MG 24 hr capsule Take 75 mg by mouth daily. 12/07/21   [provider]  ?  ? ?No family history on file. ? ?Social History  ? ?Socioeconomic History  ? Marital status: Married  ?  Spouse name: Not on file  ? Number of children: Not on file  ? Years of education: Not on file  ? Highest education level: Not on file  ?Occupational History  ? Not on file  ?Tobacco Use  ? Smoking status: Former  ?  Types: Cigarettes  ?  Quit date: 01/01/2022  ?  Years since quitting: 0.1  ? Smokeless tobacco: Not on file  ?Substance and Sexual Activity  ? Alcohol use: Not on file  ? Drug use: Not on file  ? Sexual activity: Not on file  ?Other Topics Concern  ? Not on file  ?Social History Narrative  ? Not on file  ? ?Social Determinants of Health  ? ?Financial Resource Strain: Not on file  ?Food Insecurity: Not on file  ?Transportation Needs: Not on file  ?Physical Activity: Not on file  ?Stress: Not on file  ?Social Connections: Not on file  ? ? ? ?Review of Systems: A 12 point ROS discussed and pertinent positives are indicated in the HPI above.  All other systems are negative. ? ?Review of Systems  ?Constitutional:  Negative for chills, fatigue and fever.  ?Respiratory:  Negative for cough and shortness of breath.   ?Cardiovascular:  Negative for chest pain.  ?Gastrointestinal:  Negative for abdominal pain, blood in stool, constipation, diarrhea, nausea and vomiting.   ?Musculoskeletal:  Negative for back pain.  ?Neurological:  Negative for dizziness and headaches.  ? ?Vital Signs: ?BP (!) 112/54   Pulse 87   Temp 98 ?F (36.7 ?C) (Oral)   Resp (!) 22   Ht 5\' 4"  (1.626 m)   Wt 131 lb 11.2 oz (59.7 kg)   SpO2 96%   BMI 22.61 kg/m?  ? ?Physical Exam ?Vitals reviewed.  ?Constitutional:   ?   General: She is not in acute distress. ?HENT:  ?   Head: Normocephalic.  ?   Mouth/Throat:  ?   Mouth: Mucous membranes are moist.  ?   Pharynx: Oropharynx is clear. No oropharyngeal exudate or posterior oropharyngeal erythema.  ?Cardiovascular:  ?   Rate and Rhythm: Normal rate and regular rhythm.  ?Pulmonary:  ?   Effort: Pulmonary effort is normal.  ?   Comments: Diminished breath sounds bilaterally ?(+) supplemental O2 via [place] ?Abdominal:  ?   General: There is no distension.  ?   Palpations: Abdomen is soft. There is mass (LUQ -  per patient told this was cancerous mass).  ?   Tenderness: There is no abdominal tenderness.  ?Skin: ?   General: Skin is warm and dry.  ?Neurological:  ?   Mental Status: She is alert and oriented to person, place, and time.  ?Psychiatric:     ?   Mood and Affect: Mood normal.     ?   Behavior: Behavior normal.     ?   Thought Content: Thought content normal.     ?   Judgment: Judgment normal.  ? ? ? ?MD Evaluation ?Airway: WNL ?Heart: WNL ?Abdomen: WNL ?Chest/ Lungs: WNL ?ASA  Classification: 2 ?Mallampati/Airway Score: Two ? ? ?Imaging: ?NM PET Image Initial (PI) Skull Base To Thigh ? ?Result Date: 02/28/2022 ?CLINICAL DATA:  Initial treatment strategy for colon cancer. EXAM: NUCLEAR MEDICINE PET SKULL BASE TO THIGH TECHNIQUE: 7.05 mCi F-18 FDG was injected intravenously. Full-ring PET imaging was performed from the skull base to thigh after the radiotracer. CT data was obtained and used for attenuation correction and anatomic localization. Fasting blood glucose: 110 and mg/dl COMPARISON:  CT scan 02/03/2022 FINDINGS: Mediastinal blood pool activity: SUV max  1.73 Liver activity: SUV max NA NECK: No hypermetabolic lymph nodes in the neck. Incidental CT findings: none CHEST: Sub solid 15 mm superior segment left lower lobe pulmonary lesion is hypermetabolic with SUV max of 8.00. F

## 2022-03-07 ENCOUNTER — Ambulatory Visit
Admission: RE | Admit: 2022-03-07 | Discharge: 2022-03-07 | Disposition: A | Discharge status: Home / Self Care | Payer: Medicare Other | Source: Ambulatory Visit | Attending: Interventional Radiology | Admitting: Interventional Radiology

## 2022-03-07 ENCOUNTER — Other Ambulatory Visit: Payer: Self-pay

## 2022-03-07 ENCOUNTER — Ambulatory Visit
Admission: RE | Admit: 2022-03-07 | Discharge: 2022-03-07 | Disposition: A | Discharge status: Home / Self Care | Payer: Medicare Other | Source: Ambulatory Visit | Attending: Oncology | Admitting: Oncology

## 2022-03-07 DIAGNOSIS — I509 Heart failure, unspecified: Secondary | ICD-10-CM | POA: Diagnosis not present

## 2022-03-07 DIAGNOSIS — Z9889 Other specified postprocedural states: Secondary | ICD-10-CM | POA: Insufficient documentation

## 2022-03-07 DIAGNOSIS — I110 Hypertensive heart disease with heart failure: Secondary | ICD-10-CM | POA: Insufficient documentation

## 2022-03-07 DIAGNOSIS — J449 Chronic obstructive pulmonary disease, unspecified: Secondary | ICD-10-CM | POA: Insufficient documentation

## 2022-03-07 DIAGNOSIS — E785 Hyperlipidemia, unspecified: Secondary | ICD-10-CM | POA: Insufficient documentation

## 2022-03-07 DIAGNOSIS — E119 Type 2 diabetes mellitus without complications: Secondary | ICD-10-CM | POA: Diagnosis not present

## 2022-03-07 DIAGNOSIS — R5383 Other fatigue: Secondary | ICD-10-CM | POA: Insufficient documentation

## 2022-03-07 DIAGNOSIS — R911 Solitary pulmonary nodule: Secondary | ICD-10-CM | POA: Diagnosis present

## 2022-03-07 DIAGNOSIS — Z9981 Dependence on supplemental oxygen: Secondary | ICD-10-CM | POA: Insufficient documentation

## 2022-03-07 DIAGNOSIS — J95811 Postprocedural pneumothorax: Secondary | ICD-10-CM | POA: Insufficient documentation

## 2022-03-07 DIAGNOSIS — Z8673 Personal history of transient ischemic attack (TIA), and cerebral infarction without residual deficits: Secondary | ICD-10-CM | POA: Insufficient documentation

## 2022-03-07 DIAGNOSIS — C189 Malignant neoplasm of colon, unspecified: Secondary | ICD-10-CM | POA: Diagnosis not present

## 2022-03-07 LAB — CBC
HCT: 36.9 % (ref 36.0–46.0)
Hemoglobin: 11.1 g/dL — ABNORMAL LOW (ref 12.0–15.0)
MCH: 23.6 pg — ABNORMAL LOW (ref 26.0–34.0)
MCHC: 30.1 g/dL (ref 30.0–36.0)
MCV: 78.5 fL — ABNORMAL LOW (ref 80.0–100.0)
Platelets: 262 10*3/uL (ref 150–400)
RBC: 4.7 MIL/uL (ref 3.87–5.11)
RDW: 22.2 % — ABNORMAL HIGH (ref 11.5–15.5)
WBC: 12 10*3/uL — ABNORMAL HIGH (ref 4.0–10.5)
nRBC: 0 % (ref 0.0–0.2)

## 2022-03-07 LAB — GLUCOSE, CAPILLARY: Glucose-Capillary: 158 mg/dL — ABNORMAL HIGH (ref 70–99)

## 2022-03-07 LAB — PROTIME-INR
INR: 1 (ref 0.8–1.2)
Prothrombin Time: 12.7 seconds (ref 11.4–15.2)

## 2022-03-07 MED ORDER — SODIUM CHLORIDE 0.9 % IV SOLN
INTRAVENOUS | Status: DC
Start: 2022-03-07 — End: 2022-03-08

## 2022-03-07 MED ORDER — FENTANYL CITRATE (PF) 100 MCG/2ML IJ SOLN
INTRAMUSCULAR | Status: AC
  Filled 2022-03-07: qty 2

## 2022-03-07 MED ORDER — MIDAZOLAM HCL 2 MG/2ML IJ SOLN
INTRAMUSCULAR | Status: DC | PRN
  Administered 2022-03-07 (×2): 1 mg via INTRAVENOUS

## 2022-03-07 MED ORDER — MIDAZOLAM HCL 2 MG/2ML IJ SOLN
INTRAMUSCULAR | Status: AC
  Filled 2022-03-07: qty 2

## 2022-03-07 MED ORDER — FENTANYL CITRATE (PF) 100 MCG/2ML IJ SOLN
INTRAMUSCULAR | Status: DC | PRN
  Administered 2022-03-07 (×2): 50 ug via INTRAVENOUS

## 2022-03-07 NOTE — Progress Notes (Signed)
Patient s/p LLL lung biopsy today in IR, small ptx noted post procedure. ? ?Initial follow up CXR at 0955 again noted small ptx, second CXR at 1100 reviewed and shows persistent small ptx with slight improvement from previous (has not enlarged). Patient without complaints, vital signs stable.  ? ?Patient ok for discharge home - return precautions reviewed and patient stated understanding. ? ?Candiss Norse, PA-C ?

## 2022-03-07 NOTE — Procedures (Signed)
Vascular and Interventional Radiology Procedure Note ? ?Patient: Jessica Stout ?DOB: June 18, 1951 ?Medical Record Number: [id number] ?Note Date/Time: 03/07/22 10:02 AM  ? ?Performing Physician: Michaelle Birks, MD ?Assistant(s): None ? ?Diagnosis: LEFT lung nodule ? ?Procedure: LEFT LUNG NODULE BIOPSY ? ?Anesthesia: Conscious Sedation ?Complications: None ?Estimated Blood Loss: Minimal ?Specimens: Sent for Pathology ? ?Findings:  ?Successful CT-guided biopsy of LEFT lung nodule. ?A total of 4 samples were obtained. ?Hemostasis of the tract was achieved using Hemostatic Patch. ?A small postprocedural PTX was present. Will follow. ? ?Plan: Bed rest for 1 hours. ? ?See detailed procedure note with images in PACS. ?The patient tolerated the procedure well without incident or complication and was returned to Recovery in stable condition.  ? ? ?Michaelle Birks, MD ?[facility] ?[facility] ? ? ?Pager. [phone] ?Clinic. [phone]  ?

## 2022-03-10 NOTE — Progress Notes (Signed)
? Patient ID: Alley Neils, female    DOB: 12-Oct-1951, 71 y.o.   MRN: [id number] ? ?HPI ? ?Ms Bosko is a 71 y/o female with a history of asthma, colon cancer, DM, hyperlipidemia, HTN, stroke, anemia, anxiety, COPD, recent tobacco use and chronic heart failure.  ? ?Echo report from 01/30/22 reviewed and showed an EF of 45-50% along with mild LVH and mild/moderate MR.  ? ?Admitted 01/29/22 due to SOB and pedal edema due to new onset HF. Initially given IV lasix with transition to oral diuretics. Cardiology, GI, oncology & surgical consults obtained.  Colonoscopy and EGD pursued 3/6 with results showing duodenal ulcer, erosive gastropathy, and colon mass. CT revealed pulmonary nodule and left adrenal lesion. IV iron given due to anemia. Discharged after 7 days.  ? ?She presents for a follow-up visit with a chief complaint of moderate fatigue upon minimal exertion. Describes this as chronic in nature. She has associated decreased appetite (due to pain), cough, generalized weakness, abdominal pain and weight loss along with this. She denies any difficulty sleeping, abdominal distention, palpitations, pedal edema, chest pain, wheezing or dizziness.  ? ?Has just recently started drinking glucerna and trying to eat foods high in protein. Gets upper abdominal pain while eating but not with any consistent foods. Has recently eaten smokie sausages without pain as well as yogurt / cottage cheese without pain.  ? ?Has PT coming twice weekly. Waiting on plans to get finalized regarding colon cancer surgery.  ? ?Past Medical History:  ?Diagnosis Date  ? Anemia   ? Anxiety   ? Asthma   ? Cancer [facility])   ? colon  ? CHF (congestive heart failure) ([place])   ? COPD (chronic obstructive pulmonary disease) ([place])   ? Diabetes mellitus without complication ([place])   ? Hyperlipidemia   ? Hypertension   ? Stroke [facility])   ? ?Past Surgical History:  ?Procedure Laterality Date  ? COLONOSCOPY WITH PROPOFOL N/A 02/03/2022  ? Procedure: COLONOSCOPY WITH  PROPOFOL;  Surgeon: Lin Landsman, MD;  Location: [facility] ENDOSCOPY;  Service: Gastroenterology;  Laterality: N/A;  ? ESOPHAGOGASTRODUODENOSCOPY (EGD) WITH PROPOFOL N/A 02/03/2022  ? Procedure: ESOPHAGOGASTRODUODENOSCOPY (EGD) WITH PROPOFOL;  Surgeon: Lin Landsman, MD;  Location: [facility] ENDOSCOPY;  Service: Gastroenterology;  Laterality: N/A;  ? GIVENS CAPSULE STUDY N/A 02/03/2022  ? Procedure: GIVENS CAPSULE STUDY;  Surgeon: Lin Landsman, MD;  Location: [facility] ENDOSCOPY;  Service: Gastroenterology;  Laterality: N/A;  possible Capsule Study  ? ?No family history on file. ?Social History  ? ?Tobacco Use  ? Smoking status: Some Days  ?  Types: Cigarettes, E-cigarettes  ?  Last attempt to quit: 01/01/2022  ?  Years since quitting: 0.1  ? Smokeless tobacco: Not on file  ?Substance Use Topics  ? Alcohol use: Not on file  ? ?Allergies  ?Allergen Reactions  ? Codeine Rash  ?  Happened in 1970s  ? ?Prior to Admission medications   ?Medication Sig Start Date End Date Taking? Authorizing Provider  ?acetaminophen (TYLENOL) 325 MG tablet Take 650 mg by mouth every 6 (six) hours as needed.   Yes [provider]  ?albuterol (VENTOLIN HFA) 108 (90 Base) MCG/ACT inhaler Inhale into the lungs. 01/19/22  Yes [provider]  ?atorvastatin (LIPITOR) 40 MG tablet Take 40 mg by mouth daily. 12/12/21  Yes [provider]  ?furosemide (LASIX) 40 MG tablet Take 40 mg by mouth daily. 01/23/22  Yes [provider]  ?gabapentin (NEURONTIN) 600 MG tablet Take by mouth 2 (  two) times daily. 01/01/22  Yes [provider]  ?JANUMET 50-1000 MG tablet Take 1 tablet by mouth daily. 11/24/21  Yes [provider]  ?LANTUS SOLOSTAR 100 UNIT/ML Solostar Pen Inject 12 Units into the skin at bedtime. 01/17/22  Yes [provider]  ?losartan (COZAAR) 25 MG tablet Take 25 mg by mouth daily. 12/12/21  Yes [provider]  ?oxyCODONE (OXY IR/ROXICODONE) 5 MG immediate release tablet Take 1  tablet (5 mg total) by mouth every 4 (four) hours as needed for severe pain. 02/13/22  Yes Lloyd Huger, MD  ?venlafaxine XR (EFFEXOR-XR) 75 MG 24 hr capsule Take 75 mg by mouth daily. 12/07/21  Yes [provider]  ?metoprolol succinate (TOPROL-XL) 25 MG 24 hr tablet Take 1 tablet (25 mg total) by mouth daily. ?Patient not taking: Reported on 03/11/2022 02/06/22 03/08/22  Wyvonnia Dusky, MD  ?pantoprazole (PROTONIX) 40 MG tablet Take 1 tablet (40 mg total) by mouth daily. 03/11/22 04/10/22  Alisa Graff, FNP  ?traMADol (ULTRAM) 50 MG tablet Take 50 mg by mouth 4 (four) times daily as needed. ?Patient not taking: Reported on 03/05/2022 01/20/22   [provider]  ? ? ?Review of Systems  ?Constitutional:  Positive for appetite change (decreased) and fatigue (easily).  ?HENT:  Negative for congestion, postnasal drip and sore throat.   ?Eyes: Negative.   ?Respiratory:  Positive for cough (intermittent) and shortness of breath (minimal). Negative for wheezing.   ?Cardiovascular:  Negative for chest pain, palpitations and leg swelling.  ?Gastrointestinal:  Positive for abdominal pain. Negative for abdominal distention.  ?Endocrine: Negative.   ?Genitourinary: Negative.   ?Musculoskeletal:  Negative for back pain and neck pain.  ?Skin: Negative.   ?Allergic/Immunologic: Negative.   ?Neurological:  Positive for weakness (generalized). Negative for dizziness and light-headedness.  ?Hematological:  Negative for adenopathy. Does not bruise/bleed easily.  ?Psychiatric/Behavioral:  Negative for dysphoric mood and sleep disturbance (sleeping on 1 pillow with oxygen @ 2L). The patient is not nervous/anxious.   ? ?Vitals:  ? 03/11/22 1035  ?BP: 119/63  ?Pulse: 96  ?Resp: 14  ?SpO2: 100%  ?Weight: 125 lb 7 oz (56.9 kg)  ?Height: 5\' 4"  (1.626 m)  ? ?Wt Readings from Last 3 Encounters:  ?03/11/22 125 lb 7 oz (56.9 kg)  ?03/07/22 131 lb 11.2 oz (59.7 kg)  ?03/05/22 131 lb 11.2 oz (59.7 kg)  ? ?Lab Results   ?Component Value Date  ? CREATININE 0.65 02/05/2022  ? CREATININE 0.74 02/04/2022  ? CREATININE 0.76 02/03/2022  ? ?Physical Exam ?Vitals and nursing note reviewed. Exam conducted with a chaperone present (husband).  ?Constitutional:   ?   Appearance: Normal appearance.  ?HENT:  ?   Head: Normocephalic and atraumatic.  ?Cardiovascular:  ?   Rate and Rhythm: Normal rate and regular rhythm.  ?Pulmonary:  ?   Effort: Pulmonary effort is normal. No respiratory distress.  ?   Breath sounds: No wheezing or rales.  ?Abdominal:  ?   General: There is no distension.  ?   Palpations: Abdomen is soft.  ?   Tenderness: There is no abdominal tenderness.  ?Musculoskeletal:     ?   General: No tenderness.  ?   Cervical back: Normal range of motion and neck supple.  ?   Right lower leg: No edema.  ?   Left lower leg: No edema.  ?Skin: ?   General: Skin is warm and dry.  ?Neurological:  ?   General: No focal  deficit present.  ?   Mental Status: She is alert and oriented to person, place, and time.  ?Psychiatric:     ?   Mood and Affect: Mood normal.     ?   Behavior: Behavior normal.     ?   Thought Content: Thought content normal.  ? ?Assessment & Plan: ? ?1: Chronic heart failure with mildly reduced ejection fraction- ?- NYHA class  III ?- euvolemic  ?- weighing daily and understands to call for an overnight weight gain of > 2 pounds or a weekly weight gain of > 5 pounds ?- weight down 21 pounds from last visit here 1 month ago ?- not adding salt to her food but appetite markedly decreased; getting food higher in salt just because she's trying to eat whatever doesn't hurt her stomach (4 BBQ smokies, yogurt, cottage cheese) ?- saw cardiology Corky Sox) 02/19/22 ?- on GDMT of losartan and metoprolol succinate; on janumet ?- BNP 01/23/22 was 2004.6 ? ?2: HTN- ?- BP looks good (119/63) ?- follows with PCP Hall Busing) ?- BMP 02/19/22 reviewed and showed sodium 141, potassium 5.2, creatinine 0.8 and GFR 70 ? ?3: DM- ?- A1c 01/29/22 was 6.5% ?-  fasting glucose at home was 128 ? ?4: COPD- ?- wearing oxygen at 2L just at bedtime now; checks her pulse ox throughout the day just to keep an eye out and it consistently stays >90% ?- quit smoking Jan 2023 ? ?5: Colon

## 2022-03-11 ENCOUNTER — Encounter: Payer: Self-pay | Admitting: Family

## 2022-03-11 ENCOUNTER — Other Ambulatory Visit: Payer: Self-pay | Admitting: Anatomic Pathology & Clinical Pathology

## 2022-03-11 ENCOUNTER — Ambulatory Visit: Payer: Medicare Other | Attending: Family | Admitting: Family

## 2022-03-11 VITALS — BP 119/63 | HR 96 | Resp 14 | Ht 64.0 in | Wt 125.4 lb

## 2022-03-11 DIAGNOSIS — I10 Essential (primary) hypertension: Secondary | ICD-10-CM | POA: Diagnosis not present

## 2022-03-11 DIAGNOSIS — Z79899 Other long term (current) drug therapy: Secondary | ICD-10-CM | POA: Diagnosis not present

## 2022-03-11 DIAGNOSIS — C189 Malignant neoplasm of colon, unspecified: Secondary | ICD-10-CM | POA: Insufficient documentation

## 2022-03-11 DIAGNOSIS — E785 Hyperlipidemia, unspecified: Secondary | ICD-10-CM | POA: Diagnosis not present

## 2022-03-11 DIAGNOSIS — J449 Chronic obstructive pulmonary disease, unspecified: Secondary | ICD-10-CM | POA: Diagnosis not present

## 2022-03-11 DIAGNOSIS — I110 Hypertensive heart disease with heart failure: Secondary | ICD-10-CM | POA: Diagnosis present

## 2022-03-11 DIAGNOSIS — Z794 Long term (current) use of insulin: Secondary | ICD-10-CM

## 2022-03-11 DIAGNOSIS — Z9981 Dependence on supplemental oxygen: Secondary | ICD-10-CM | POA: Insufficient documentation

## 2022-03-11 DIAGNOSIS — Z87891 Personal history of nicotine dependence: Secondary | ICD-10-CM | POA: Diagnosis not present

## 2022-03-11 DIAGNOSIS — E119 Type 2 diabetes mellitus without complications: Secondary | ICD-10-CM | POA: Insufficient documentation

## 2022-03-11 DIAGNOSIS — I5022 Chronic systolic (congestive) heart failure: Secondary | ICD-10-CM | POA: Insufficient documentation

## 2022-03-11 DIAGNOSIS — C3490 Malignant neoplasm of unspecified part of unspecified bronchus or lung: Secondary | ICD-10-CM | POA: Diagnosis not present

## 2022-03-11 DIAGNOSIS — C184 Malignant neoplasm of transverse colon: Secondary | ICD-10-CM

## 2022-03-11 DIAGNOSIS — Z8673 Personal history of transient ischemic attack (TIA), and cerebral infarction without residual deficits: Secondary | ICD-10-CM | POA: Insufficient documentation

## 2022-03-11 DIAGNOSIS — D649 Anemia, unspecified: Secondary | ICD-10-CM | POA: Insufficient documentation

## 2022-03-11 DIAGNOSIS — I890 Lymphedema, not elsewhere classified: Secondary | ICD-10-CM | POA: Insufficient documentation

## 2022-03-11 LAB — SURGICAL PATHOLOGY

## 2022-03-11 MED ORDER — PANTOPRAZOLE SODIUM 40 MG PO TBEC: 40.0000 mg | DELAYED_RELEASE_TABLET | Freq: Every day | ORAL | 5 refills | Status: AC

## 2022-03-11 NOTE — Patient Instructions (Addendum)
Continue weighing daily and call for an overnight weight gain of 3 pounds or more or a weekly weight gain of more than 5 pounds. ? ? ?If you have voicemail, please make sure your mailbox is cleaned out so that we may leave a message and please make sure to listen to any voicemails.  ? ? ?Call us in the future if you need us for anything ?

## 2022-03-13 ENCOUNTER — Encounter: Payer: Self-pay | Admitting: Oncology

## 2022-03-13 ENCOUNTER — Inpatient Hospital Stay (HOSPITAL_BASED_OUTPATIENT_CLINIC_OR_DEPARTMENT_OTHER): Payer: Medicare Other | Admitting: Hospice and Palliative Medicine

## 2022-03-13 ENCOUNTER — Inpatient Hospital Stay (HOSPITAL_BASED_OUTPATIENT_CLINIC_OR_DEPARTMENT_OTHER): Payer: Medicare Other | Admitting: Oncology

## 2022-03-13 VITALS — BP 115/60 | HR 66 | Temp 96.7°F | Resp 16 | Ht 64.0 in | Wt 133.0 lb

## 2022-03-13 DIAGNOSIS — R911 Solitary pulmonary nodule: Secondary | ICD-10-CM | POA: Diagnosis not present

## 2022-03-13 DIAGNOSIS — Z515 Encounter for palliative care: Secondary | ICD-10-CM

## 2022-03-13 DIAGNOSIS — C189 Malignant neoplasm of colon, unspecified: Secondary | ICD-10-CM

## 2022-03-13 NOTE — Progress Notes (Signed)
? ?  ?Palliative Medicine ?[place] at [facility] ?Telephone:(336) [phone] Fax:(336) [phone] ? ? ?Name: Jessica Stout ?Date: 03/13/2022 ?MRN: [id number]  ?DOB: August 09, 1951 ? ?Patient Care Team: ?Albina Billet, MD as PCP - General (Internal Medicine) ?Clent Jacks, RN as Oncology Nurse Navigator  ? ? ?REASON FOR CONSULTATION: ?Jessica Stout is a 71 y.o. 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  Palliative care was consulted to address goals. ? ?SOCIAL HISTORY:    ? reports that she has been smoking cigarettes and e-cigarettes. She does not have any smokeless tobacco history on file. ? ?Patient is married and lives at home with her husband and two cats.  She has a son who lives nearby and is involved.  Patient previously worked as a Regulatory affairs officer. ? ?ADVANCE DIRECTIVES:  ?None on file ? ?CODE STATUS:  ? ?PAST MEDICAL HISTORY: ?Past Medical History:  ?Diagnosis Date  ? Anemia   ? Anxiety   ? Asthma   ? Cancer [facility])   ? colon  ? CHF (congestive heart failure) ([place])   ? COPD (chronic obstructive pulmonary disease) ([place])   ? Diabetes mellitus without complication ([place])   ? Hyperlipidemia   ? Hypertension   ? Stroke [facility])   ? ? ?PAST SURGICAL HISTORY:  ?Past Surgical History:  ?Procedure Laterality Date  ? COLONOSCOPY WITH PROPOFOL N/A 02/03/2022  ? Procedure: COLONOSCOPY WITH PROPOFOL;  Surgeon: Lin Landsman, MD;  Location: [facility] ENDOSCOPY;  Service: Gastroenterology;  Laterality: N/A;  ? ESOPHAGOGASTRODUODENOSCOPY (EGD) WITH PROPOFOL N/A 02/03/2022  ? Procedure: ESOPHAGOGASTRODUODENOSCOPY (EGD) WITH PROPOFOL;  Surgeon: Lin Landsman, MD;  Location: [facility] ENDOSCOPY;  Service: Gastroenterology;  Laterality: N/A;  ? GIVENS  CAPSULE STUDY N/A 02/03/2022  ? Procedure: GIVENS CAPSULE STUDY;  Surgeon: Lin Landsman, MD;  Location: [facility] ENDOSCOPY;  Service: Gastroenterology;  Laterality: N/A;  possible Capsule Study  ? ? ?HEMATOLOGY/ONCOLOGY HISTORY:  ?Oncology History  ? No history exists.  ? ? ?ALLERGIES:  is allergic to codeine. ? ?MEDICATIONS:  ?Current Outpatient Medications  ?Medication Sig Dispense Refill  ? acetaminophen (TYLENOL) 325 MG tablet Take 650 mg by mouth every 6 (six) hours as needed.    ? albuterol (VENTOLIN HFA) 108 (90 Base) MCG/ACT inhaler Inhale into the lungs.    ? atorvastatin (LIPITOR) 40 MG tablet Take 40 mg by mouth daily.    ? furosemide (LASIX) 40 MG tablet Take 40 mg by mouth daily.    ? gabapentin (NEURONTIN) 600 MG tablet Take by mouth 2 (two) times daily.    ? JANUMET 50-1000 MG tablet Take 1 tablet by mouth daily.    ? LANTUS SOLOSTAR 100 UNIT/ML Solostar Pen Inject 12 Units into the skin at bedtime.    ? losartan (COZAAR) 25 MG tablet Take 25 mg by mouth daily.    ? metoprolol succinate (TOPROL-XL) 25 MG 24 hr tablet Take 1 tablet (25 mg total) by mouth daily. 30 tablet 0  ? oxyCODONE (OXY IR/ROXICODONE) 5 MG immediate release tablet Take 1 tablet (5 mg total) by mouth every 4 (four) hours as needed for severe pain. (Patient not taking: Reported on 03/13/2022) 30 tablet 0  ? pantoprazole (PROTONIX) 40 MG tablet Take 1 tablet (40 mg total) by mouth daily. 30 tablet 5  ? traMADol (  ULTRAM) 50 MG tablet Take 50 mg by mouth 4 (four) times daily as needed. (Patient not taking: Reported on 03/05/2022)    ? venlafaxine XR (EFFEXOR-XR) 75 MG 24 hr capsule Take 75 mg by mouth daily.    ? ?No current facility-administered medications for this visit.  ? ? ?VITAL SIGNS: ?There were no vitals taken for this visit. ?There were no vitals filed for this visit.  ?Estimated body mass index is 22.83 kg/m? as calculated from the following: ?  Height as of an earlier encounter on 03/13/22: 5\' 4"  (1.626 m). ?  Weight as of an  earlier encounter on 03/13/22: 133 lb (60.3 kg). ? ?LABS: ?CBC: ?   ?Component Value Date/Time  ? WBC 12.0 (H) 03/07/2022 0755  ? HGB 11.1 (L) 03/07/2022 0755  ? HCT 36.9 03/07/2022 0755  ? PLT 262 03/07/2022 0755  ? MCV 78.5 (L) 03/07/2022 0755  ? NEUTROABS 7.0 02/12/2022 1013  ? LYMPHSABS 1.0 02/12/2022 1013  ? MONOABS 0.8 02/12/2022 1013  ? EOSABS 0.1 02/12/2022 1013  ? BASOSABS 0.0 02/12/2022 1013  ? ?Comprehensive Metabolic Panel: ?   ?Component Value Date/Time  ? NA 142 02/05/2022 0628  ? K 3.5 02/05/2022 0628  ? CL 100 02/05/2022 0628  ? CO2 32 02/05/2022 0628  ? BUN 16 02/05/2022 0628  ? CREATININE 0.65 02/05/2022 0628  ? GLUCOSE 137 (H) 02/05/2022 9449  ? CALCIUM 8.9 02/05/2022 0628  ? AST 14 (L) 02/04/2022 6759  ? ALT 12 02/04/2022 0658  ? ALKPHOS 46 02/04/2022 0658  ? BILITOT 0.5 02/04/2022 0658  ? PROT 5.9 (L) 02/04/2022 1638  ? ALBUMIN 2.9 (L) 02/04/2022 4665  ? ? ?RADIOGRAPHIC STUDIES: ?NM PET Image Initial (PI) Skull Base To Thigh ? ?Result Date: 02/28/2022 ?CLINICAL DATA:  Initial treatment strategy for colon cancer. EXAM: NUCLEAR MEDICINE PET SKULL BASE TO THIGH TECHNIQUE: 7.05 mCi F-18 FDG was injected intravenously. Full-ring PET imaging was performed from the skull base to thigh after the radiotracer. CT data was obtained and used for attenuation correction and anatomic localization. Fasting blood glucose: 110 and mg/dl COMPARISON:  CT scan 02/03/2022 FINDINGS: Mediastinal blood pool activity: SUV max 1.73 Liver activity: SUV max NA NECK: No hypermetabolic lymph nodes in the neck. Incidental CT findings: none CHEST: Sub solid 15 mm superior segment left lower lobe pulmonary lesion is hypermetabolic with SUV max of 9.93. Findings consistent with a neoplastic process. This is more suspicious for a primary lung neoplasm than a metastatic focus. No other pulmonary nodules are identified. No enlarged or hypermetabolic mediastinal or hilar lymph nodes. Incidental CT findings: Stable advanced  atherosclerotic calcifications involving the aorta and coronary arteries. Stable mild cardiac enlargement. Stable underlying emphysematous changes. ABDOMEN/PELVIS: The 7 cm transverse colon mass is hypermetabolic with SUV max of 57.01 and consistent with known neoplasm. No adjacent hypermetabolic lymphadenopathy. No findings to suggest hepatic metastatic disease. 3.5 cm low-attenuation left adrenal gland lesion is not hypermetabolic and consistent with benign adenoma. No follow-up imaging is recommended. Incidental CT findings: Stable advanced vascular calcifications. Single calcified gallstone is noted in the gallbladder. Surgical changes involving the stomach and colon and anterior abdominal wall with mesh. There is hypermetabolism likely related to prior surgery. SKELETON: No findings suspicious for osseous metastatic disease. Incidental CT findings: none IMPRESSION: 1. Large transverse colon mass is hypermetabolic and consistent with known neoplasm. No findings to suggest abdominal metastatic disease. No hypermetabolic adenopathy or hepatic lesions. 2. Left adrenal gland lesion consistent with a benign adenoma. 3. Hypermetabolic  superior segment left lower lobe lesion could be a partially necrotic solitary metastasis or a primary lung neoplasm (favored). 4. Advanced vascular calcifications. 5. Postoperative changes involving the abdomen/pelvis. 6. Cholelithiasis. 7. Electronically Signed   By: Marijo Sanes M.D.   On: 02/28/2022 11:24  ? ?CT BIOPSY ? ?Result Date: 03/07/2022 ?INDICATION: Patient with colon cancer and hypermetabolic LEFT lung nodule on PET-CT. EXAM: CT-GUIDED LEFT LOWER LOBE LUNG NODULE BIOPSY RADIATION DOSE REDUCTION: This exam was performed according to the departmental dose-optimization program which includes automated exposure control, adjustment of the mA and/or kV according to patient size and/or use of iterative reconstruction technique. COMPARISON:  CT chest, 02/03/2022.  PET-CT,  02/27/2022. MEDICATIONS: None. ANESTHESIA/SEDATION: Moderate sedation was employed during this procedure. A total of Versed 2 mg Fentanyl 100 mcg was administered intravenously. The patient's level of consciousness and vital

## 2022-03-13 NOTE — Progress Notes (Signed)
?Jessica Stout  ?[phone]) [phone] [phone]) [phone] ? ?ID: [name] OB: 01/26/1951  MR#: [id number]  UXL#:[id number] ? ?Patient Care Team: ?Albina Billet, MD as PCP - General (Internal Medicine) ?Clent Jacks, RN as Oncology Nurse Navigator ? 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 ? ?INTERVAL HISTORY: Patient returns to clinic today for further evaluation, discussion of her lung biopsy results, and treatment planning.  She continues to have weakness and fatigue, but otherwise feels well. She has no neurologic complaints.  She denies any recent fevers.  She has a fair appetite, but denies weight loss.  She has no chest pain, shortness of breath, cough, or hemoptysis.  She denies any nausea, vomiting, constipation, or diarrhea.  She has no melena or hematochezia.  Patient offers no further specific complaints today. ? ?REVIEW OF SYSTEMS:   ?Review of Systems  ?Constitutional:  Positive for malaise/fatigue. Negative for fever and weight loss.  ?Respiratory: Negative.  Negative for cough, hemoptysis and shortness of breath.   ?Cardiovascular: Negative.  Negative for chest pain and leg swelling.  ?Gastrointestinal:  Positive for abdominal pain. Negative for blood in stool, constipation, diarrhea, melena, nausea and vomiting.  ?Genitourinary: Negative.  Negative for dysuria.  ?Musculoskeletal: Negative.  Negative for back pain.  ?Skin: Negative.  Negative for rash.  ?Neurological:  Positive for weakness. Negative for dizziness, focal weakness and headaches.  ?Psychiatric/Behavioral: Negative.  The patient is not nervous/anxious.   ? ?As per HPI. Otherwise, a complete review of systems is negative. ? ?PAST MEDICAL HISTORY: ?Past Medical History:  ?Diagnosis Date  ? Anemia   ? Anxiety   ? Asthma   ? Cancer [facility])   ? colon  ? CHF (congestive heart failure) ([place])   ? COPD (chronic obstructive pulmonary disease) ([place])   ? Diabetes mellitus without  complication ([place])   ? Hyperlipidemia   ? Hypertension   ? Stroke [facility])   ? ? ?PAST SURGICAL HISTORY: ?Past Surgical History:  ?Procedure Laterality Date  ? COLONOSCOPY WITH PROPOFOL N/A 02/03/2022  ? Procedure: COLONOSCOPY WITH PROPOFOL;  Surgeon: Lin Landsman, MD;  Location: [facility] ENDOSCOPY;  Service: Gastroenterology;  Laterality: N/A;  ? ESOPHAGOGASTRODUODENOSCOPY (EGD) WITH PROPOFOL N/A 02/03/2022  ? Procedure: ESOPHAGOGASTRODUODENOSCOPY (EGD) WITH PROPOFOL;  Surgeon: Lin Landsman, MD;  Location: [facility] ENDOSCOPY;  Service: Gastroenterology;  Laterality: N/A;  ? GIVENS CAPSULE STUDY N/A 02/03/2022  ? Procedure: GIVENS CAPSULE STUDY;  Surgeon: Lin Landsman, MD;  Location: [facility] ENDOSCOPY;  Service: Gastroenterology;  Laterality: N/A;  possible Capsule Study  ? ? ?FAMILY HISTORY: ?History reviewed. No pertinent family history. ? ?ADVANCED DIRECTIVES (Y/N):  N ? ?HEALTH MAINTENANCE: ?Social History  ? ?Tobacco Use  ? Smoking status: Some Days  ?  Types: Cigarettes, E-cigarettes  ?  Last attempt to quit: 01/01/2022  ?  Years since quitting: 0.1  ? ? ? Colonoscopy: ? PAP: ? Bone density: ? Lipid panel: ? ?Allergies  ?Allergen Reactions  ? Codeine Rash  ?  Happened in 1970s  ? ? ?Current Outpatient Medications  ?Medication Sig Dispense Refill  ? acetaminophen (TYLENOL) 325 MG tablet Take 650 mg by mouth every 6 (six) hours as needed.    ? albuterol (VENTOLIN HFA) 108 (90 Base) MCG/ACT inhaler Inhale into the lungs.    ? atorvastatin (LIPITOR) 40 MG tablet Take 40 mg by mouth daily.    ? furosemide (LASIX) 40 MG tablet Take 40 mg by mouth daily.    ?  gabapentin (NEURONTIN) 600 MG tablet Take by mouth 2 (two) times daily.    ? JANUMET 50-1000 MG tablet Take 1 tablet by mouth daily.    ? LANTUS SOLOSTAR 100 UNIT/ML Solostar Pen Inject 12 Units into the skin at bedtime.    ? losartan (COZAAR) 25 MG tablet Take 25 mg by mouth daily.    ? metoprolol succinate (TOPROL-XL) 25 MG 24 hr tablet Take 1 tablet (25 mg  total) by mouth daily. 30 tablet 0  ? pantoprazole (PROTONIX) 40 MG tablet Take 1 tablet (40 mg total) by mouth daily. 30 tablet 5  ? venlafaxine XR (EFFEXOR-XR) 75 MG 24 hr capsule Take 75 mg by mouth daily.    ? oxyCODONE (OXY IR/ROXICODONE) 5 MG immediate release tablet Take 1 tablet (5 mg total) by mouth every 4 (four) hours as needed for severe pain. (Patient not taking: Reported on 03/13/2022) 30 tablet 0  ? traMADol (ULTRAM) 50 MG tablet Take 50 mg by mouth 4 (four) times daily as needed. (Patient not taking: Reported on 03/05/2022)    ? ?No current facility-administered medications for this visit.  ? ? ?OBJECTIVE: ?Vitals:  ? 03/13/22 0930  ?BP: 115/60  ?Pulse: 66  ?Resp: 16  ?Temp: (!) 96.7 ?F (35.9 ?C)  ?SpO2: 100%  ? ?   Body mass index is 22.83 kg/m?Marland Kitchen    ECOG FS:1 - Symptomatic but completely ambulatory ? ?General: Well-developed, well-nourished, no acute distress.  Sitting in a wheelchair. ?Eyes: Pink conjunctiva, anicteric sclera. ?HEENT: Normocephalic, moist mucous membranes. ?Lungs: No audible wheezing or coughing. ?Heart: Regular rate and rhythm. ?Abdomen: Soft, nontender, no obvious distention. ?Musculoskeletal: No edema, cyanosis, or clubbing. ?Neuro: Alert, answering all questions appropriately. Cranial nerves grossly intact. ?Skin: No rashes or petechiae noted. ?Psych: Normal affect. ? ?LAB RESULTS: ? ?Lab Results  ?Component Value Date  ? NA 142 02/05/2022  ? K 3.5 02/05/2022  ? CL 100 02/05/2022  ? CO2 32 02/05/2022  ? GLUCOSE 137 (H) 02/05/2022  ? BUN 16 02/05/2022  ? CREATININE 0.65 02/05/2022  ? CALCIUM 8.9 02/05/2022  ? PROT 5.9 (L) 02/04/2022  ? ALBUMIN 2.9 (L) 02/04/2022  ? AST 14 (L) 02/04/2022  ? ALT 12 02/04/2022  ? ALKPHOS 46 02/04/2022  ? BILITOT 0.5 02/04/2022  ? GFRNONAA >60 02/05/2022  ? ? ?Lab Results  ?Component Value Date  ? WBC 12.0 (H) 03/07/2022  ? NEUTROABS 7.0 02/12/2022  ? HGB 11.1 (L) 03/07/2022  ? HCT 36.9 03/07/2022  ? MCV 78.5 (L) 03/07/2022  ? PLT 262 03/07/2022   ? ? ? ?STUDIES: ?NM PET Image Initial (PI) Skull Base To Thigh ? ?Result Date: 02/28/2022 ?CLINICAL DATA:  Initial treatment strategy for colon cancer. EXAM: NUCLEAR MEDICINE PET SKULL BASE TO THIGH TECHNIQUE: 7.05 mCi F-18 FDG was injected intravenously. Full-ring PET imaging was performed from the skull base to thigh after the radiotracer. CT data was obtained and used for attenuation correction and anatomic localization. Fasting blood glucose: 110 and mg/dl COMPARISON:  CT scan 02/03/2022 FINDINGS: Mediastinal blood pool activity: SUV max 1.73 Liver activity: SUV max NA NECK: No hypermetabolic lymph nodes in the neck. Incidental CT findings: none CHEST: Sub solid 15 mm superior segment left lower lobe pulmonary lesion is hypermetabolic with SUV max of 9.93. Findings consistent with a neoplastic process. This is more suspicious for a primary lung neoplasm than a metastatic focus. No other pulmonary nodules are identified. No enlarged or hypermetabolic mediastinal or hilar lymph nodes. Incidental CT findings: Stable advanced  atherosclerotic calcifications involving the aorta and coronary arteries. Stable mild cardiac enlargement. Stable underlying emphysematous changes. ABDOMEN/PELVIS: The 7 cm transverse colon mass is hypermetabolic with SUV max of 17.53 and consistent with known neoplasm. No adjacent hypermetabolic lymphadenopathy. No findings to suggest hepatic metastatic disease. 3.5 cm low-attenuation left adrenal gland lesion is not hypermetabolic and consistent with benign adenoma. No follow-up imaging is recommended. Incidental CT findings: Stable advanced vascular calcifications. Single calcified gallstone is noted in the gallbladder. Surgical changes involving the stomach and colon and anterior abdominal wall with mesh. There is hypermetabolism likely related to prior surgery. SKELETON: No findings suspicious for osseous metastatic disease. Incidental CT findings: none IMPRESSION: 1. Large transverse  colon mass is hypermetabolic and consistent with known neoplasm. No findings to suggest abdominal metastatic disease. No hypermetabolic adenopathy or hepatic lesions. 2. Left adrenal gland lesion consistent with a benign a

## 2022-03-17 ENCOUNTER — Ambulatory Visit: Payer: Medicare Other | Admitting: Radiation Oncology

## 2022-03-18 ENCOUNTER — Telehealth: Payer: Self-pay

## 2022-03-18 NOTE — Telephone Encounter (Signed)
Voicemail left on mobile number to return call to reschedule missed radiation consult appointment. ?

## 2022-03-19 ENCOUNTER — Inpatient Hospital Stay: Payer: Medicare Other

## 2022-03-19 ENCOUNTER — Encounter: Payer: Self-pay | Admitting: Gastroenterology

## 2022-03-19 ENCOUNTER — Ambulatory Visit (INDEPENDENT_AMBULATORY_CARE_PROVIDER_SITE_OTHER): Payer: Medicare Other | Admitting: Gastroenterology

## 2022-03-19 VITALS — BP 127/51 | HR 114 | Temp 98.1°F | Ht 64.0 in | Wt 133.0 lb

## 2022-03-19 DIAGNOSIS — K269 Duodenal ulcer, unspecified as acute or chronic, without hemorrhage or perforation: Secondary | ICD-10-CM

## 2022-03-19 DIAGNOSIS — M1990 Unspecified osteoarthritis, unspecified site: Secondary | ICD-10-CM | POA: Insufficient documentation

## 2022-03-19 DIAGNOSIS — R942 Abnormal results of pulmonary function studies: Secondary | ICD-10-CM | POA: Insufficient documentation

## 2022-03-19 DIAGNOSIS — C189 Malignant neoplasm of colon, unspecified: Secondary | ICD-10-CM | POA: Diagnosis not present

## 2022-03-19 DIAGNOSIS — F4321 Adjustment disorder with depressed mood: Secondary | ICD-10-CM | POA: Insufficient documentation

## 2022-03-19 NOTE — Progress Notes (Signed)
?  ?Cephas Darby, MD ?[address]  ?[address]  ?[place], [place] [address]  ?Main: [phone]  ?Fax: [phone] ? ? ? ?Gastroenterology Consultation ? ?Referring Provider:     No ref. provider found ?Primary Care Physician:  Albina Billet, MD ?Primary Gastroenterologist:  Dr. Cephas Darby ?Reason for Consultation:   Iron deficiency anemia, colon cancer ?      ? HPI:   ?Jessica Stout is a 71 y.o. female referred by Dr. Albina Billet, MD  for consultation & management of iron deficiency anemia.  Patient has history of multiple comorbidities including diabetes, hypertension, history of COPD, CHF, hyperlipidemia who was admitted at [facility] in early March 2023 with congestive heart failure in setting of severe iron deficiency anemia.  Her hemoglobin was 9.2, serum ferritin 11.  She underwent upper endoscopy which was unremarkable.  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  Patient is scheduled to undergo radiation therapy for her lung cancer.  She is also scheduled to undergo surgery by the [facility] plastic surgeon in May.  Patient reports doing well.  Her anemia is improving to iron therapy.  She does not have any GI concerns today ? ?NSAIDs: None ? ?Antiplts/Anticoagulants/Anti thrombotics: None ? ?GI Procedures: ?EGD and colonoscopy 02/03/2022 ? ?- Non-bleeding duodenal ulcers with a clean ulcer base (Forrest Class III). Biopsied. ?- Erosive gastropathy with no bleeding and no stigmata of recent bleeding. Biopsied. ?- Small hiatal hernia. ?- Normal gastric body. Biopsied. ?- Normal gastroesophageal junction and esophagus. ? ?- Preparation of the colon was fair. ?- Likely malignant partially obstructing tumor at 70 cm proximal to the anus. Biopsied. Tattooed. ?- One 10 mm polyp in the descending colon, removed with a hot snare.  Resected and retrieved. ?- Four 5 to 7 mm polyps in the sigmoid colon, removed with a cold snare. Resected and retrieved. ?- Polypoid lesion in the distal rectum. Biopsied. ?- Non-bleeding external and internal hemorrhoids. ?- Severe diverticulosis in the entire examined colon. There was no evidence of diverticular bleeding. ? ?DIAGNOSIS:  ?A. DUODENUM; COLD BIOPSY:  ?- DUODENAL MUCOSA WITH FOCAL SURFACE EROSION.  ?- NEGATIVE FOR FEATURES OF CELIAC DISEASE.  ?- NEGATIVE FOR ACTIVE INFLAMMATION, DYSPLASIA, AND MALIGNANCY.  ? ?B. STOMACH; COLD BIOPSY:  ?- CHRONIC ACTIVE GASTRITIS WITH INTESTINAL METAPLASIA.  ?- NEGATIVE FOR DYSPLASIA AND MALIGNANCY.  ?- IMMUNOHISTOCHEMICAL STAIN FOR H PYLORI WILL BE REPORTED IN AN  ?ADDENDUM.  ? ?C. COLON MASS, 70 CM; COLD BIOPSY:  ?- INVASIVE MODERATELY DIFFERENTIATED ADENOCARCINOMA.  ? ?D. COLON POLYP, DESCENDING; HOT SNARE:  ?- TUBULAR ADENOMA.  ?- NEGATIVE FOR HIGH GRADE DYSPLASIA AND MALIGNANCY.  ? ?E. COLON POLYP X4, SIGMOID; COLD SNARE:  ?- TUBULAR ADENOMA WITH HIGH GRADE DYSPLASIA AT LEAST, ONE FRAGMENT.  ?- TUBULAR ADENOMA, MULTIPLE FRAGMENTS.  ?- SEE COMMENT.  ? ?F. RECTUM POLYPOID LESIONS, DISTAL; COLD BIOPSY:  ?- CONDYLOMA ACUMINATUM.  ? ?Comment:  ?E) In the single fragment with high grade dysplasia, an unsampled  ?invasive component cannot be excluded.  Clinical correlation is  ?recommended.  ? ?Past Medical History:  ?Diagnosis Date  ? Anemia   ? Anxiety   ? Asthma   ? Cancer [facility])   ? colon  ? CHF (congestive heart failure) ([place])   ? COPD (chronic obstructive pulmonary disease) ([place])   ? Diabetes mellitus without  complication ([place])   ? Hyperlipidemia   ? Hypertension   ? Stroke [facility])   ? ? ?Past Surgical History:  ?Procedure Laterality Date  ? COLONOSCOPY WITH PROPOFOL N/A 02/03/2022  ? Procedure: COLONOSCOPY WITH PROPOFOL;  Surgeon: Lin Landsman, MD;  Location: [facility] ENDOSCOPY;  Service: Gastroenterology;  Laterality: N/A;  ? ESOPHAGOGASTRODUODENOSCOPY (EGD) WITH  PROPOFOL N/A 02/03/2022  ? Procedure: ESOPHAGOGASTRODUODENOSCOPY (EGD) WITH PROPOFOL;  Surgeon: Lin Landsman, MD;  Location: [facility] ENDOSCOPY;  Service: Gastroenterology;  Laterality: N/A;  ? GIVENS CAPSULE STUDY N/A 02/03/2022  ? Procedure: GIVENS CAPSULE STUDY;  Surgeon: Lin Landsman, MD;  Location: [facility] ENDOSCOPY;  Service: Gastroenterology;  Laterality: N/A;  possible Capsule Study  ? ? ?Current Outpatient Medications:  ?  acetaminophen (TYLENOL) 325 MG tablet, Take 650 mg by mouth every 6 (six) hours as needed., Disp: , Rfl:  ?  albuterol (VENTOLIN HFA) 108 (90 Base) MCG/ACT inhaler, Inhale into the lungs., Disp: , Rfl:  ?  atorvastatin (LIPITOR) 40 MG tablet, Take 40 mg by mouth daily., Disp: , Rfl:  ?  furosemide (LASIX) 40 MG tablet, Take 40 mg by mouth daily., Disp: , Rfl:  ?  gabapentin (NEURONTIN) 600 MG tablet, Take by mouth 2 (two) times daily., Disp: , Rfl:  ?  JANUMET 50-1000 MG tablet, Take 1 tablet by mouth daily., Disp: , Rfl:  ?  LANTUS SOLOSTAR 100 UNIT/ML Solostar Pen, Inject 12 Units into the skin at bedtime., Disp: , Rfl:  ?  losartan (COZAAR) 25 MG tablet, Take 25 mg by mouth daily., Disp: , Rfl:  ?  oxyCODONE (OXY IR/ROXICODONE) 5 MG immediate release tablet, Take 1 tablet (5 mg total) by mouth every 4 (four) hours as needed for severe pain., Disp: 30 tablet, Rfl: 0 ?  pantoprazole (PROTONIX) 40 MG tablet, Take 1 tablet (40 mg total) by mouth daily., Disp: 30 tablet, Rfl: 5 ?  venlafaxine XR (EFFEXOR-XR) 75 MG 24 hr capsule, Take 75 mg by mouth daily., Disp: , Rfl:  ?  metoprolol succinate (TOPROL-XL) 25 MG 24 hr tablet, Take 1 tablet (25 mg total) by mouth daily. (Patient not taking: Reported on 03/19/2022), Disp: 30 tablet, Rfl: 0 ? ? ?No family history on file.  ? ?Social History  ? ?Tobacco Use  ? Smoking status: Some Days  ?  Types: Cigarettes, E-cigarettes  ?  Last attempt to quit: 01/01/2022  ?  Years since quitting: 0.2  ? ? ?Allergies as of 03/19/2022 - Review Complete  03/19/2022  ?Allergen Reaction Noted  ? Codeine Rash 01/29/2022  ? ? ?Review of Systems:    ?All systems reviewed and negative except where noted in HPI. ? ? Physical Exam:  ?BP (!) 127/51 (BP Location: Left Arm, Patient Position: Sitting, Cuff Size: Normal)   Pulse (!) 114   Temp 98.1 ?F (36.7 ?C) (Oral)   Ht 5\' 4"  (1.626 m)   Wt 133 lb (60.3 kg)   BMI 22.83 kg/m?  ?No LMP recorded. Patient is postmenopausal. ? ?General:   Alert, thin built, moderately-nourished, pleasant and cooperative in NAD ?Head:  Normocephalic and atraumatic. ?Eyes:  Sclera clear, no icterus.   Conjunctiva pink. ?Ears:  Normal auditory acuity. ?Nose:  No deformity, discharge, or lesions. ?Mouth:  No deformity or lesions,oropharynx pink & moist. ?Neck:  Supple; no masses or thyromegaly. ?Lungs:  Respirations even and unlabored.  Clear throughout to auscultation.   No wheezes, crackles, or rhonchi. No acute distress. ?Heart:  Regular rate and rhythm; no murmurs, clicks,  rubs, or gallops. ?Abdomen:  Normal bowel sounds. Soft, non-tender and non-distended without masses, hepatosplenomegaly or hernias noted.  No guarding or rebound tenderness.   ?Rectal: Not performed ?Msk:  Symmetrical without gross deformities. Good, equal movement & strength bilaterally. ?Pulses:  Normal pulses noted. ?Extremities:  No clubbing or edema.  No cyanosis. ?Neurologic:  Alert and oriented x3;  grossly normal neurologically. ?Skin:  Intact without significant lesions or rashes. No jaundice. ?Psych:  Alert and cooperative. Normal mood and affect. ? ?Imaging Studies: ?Reviewed ? ?Assessment and Plan:  ? ?Jessica Stout is a 71 y.o. female with history of tobacco use, CHF, COPD, hypertension, diabetes, chronic iron deficiency anemia with new diagnosis of primary squamous cell carcinoma of the lung as well as adenocarcinoma of the colon.  Patient does not have active GI issues that need to be addressed by me today.  She will follow-up with plastic surgeon at [facility] for  her colon cancer and Dr. Berton Mount radiation oncologist with [facility] health for her lung cancer ? ? ?Peptic ulcer disease ?Continue Protonix 40 mg p.o. twice daily ?No evidence of H. pylori ? ?Follow up as needed ? ? ?

## 2022-03-19 NOTE — Progress Notes (Addendum)
Nutrition Assessment ? ? ?Reason for Assessment:  ?Referral from [place], NP for poor appetite and weight loss.   ? ? ?ASSESSMENT:  ?71 year old female with colon cancer and stage I squamous cell carcinoma of lung.  Past medical history of CHF, COPD, DM, HLD, HTN, stroke, previous multiple abdominal operations, colectomy, anastomotic leak, fistula.  Planning surgery at [facility] on 04/11/2022 for right extended hemicolectomy, ventral hernia repair with plastic surgeon present.  Following surgery planning folfox and radiation for lung cancer.   ? ?Met with patient and husband in clinic.  Patient reports anxiety about upcoming surgery and diagnosis overall.  Reports that she gets hungry and feels like she has a good appetite.  Usually drinks glucerna shake in the am and after supper.  After glucerna shake in the am will eat 1/2 banana.  Yesterday ate hamburger patty and turnip greens later in the afternoon.  Says she did not eat good yesterday because husband was not feeling well and she was taking care of him.  Likes to eat fruit and cottage cheese, greek yogurt.  Denies nausea.  Says that she has loose stool 2-3 times per day which is normal for her.  ? ? ?Nutrition Focused Physical Exam:  ? ?Orbital Region: mild ?Buccal Region: mild ?Upper Arm Region: mild ?Thoracic and Lumbar Region: normal ?Temple Region: mild ?Clavicle Bone Region: normal ?Shoulder and Acromion Bone Region: not observed ?Scapular Bone Region: normal ?Dorsal Hand: normal ?Patellar Region: mild ?Anterior Thigh Region: moderate ?Posterior Calf Region: moderate ?Edema (RD assessment): none ?Hair: observed  ?Eyes: observed ?Mouth: observed ?Skin: observed ?Nails: observed ? ? ?Medications: protonix, lasix, janumet, lasix ? ? ?Labs: reviewed ? ? ?Anthropometrics:  ? ?Height: 64 inches ?Weight: 133 lb 4 oz today ?159 lb on 01/29/22 (fluid present) ?BMI: 22 ? ?16% weight loss in the last month and half however some of this is fluid weight loss ? ? ?Estimated  Energy Needs ? ?Kcals: 1500-1800 ?Protein: 75-90 g ?Fluid: 1500-1825m ? ? ?NUTRITION DIAGNOSIS: Increased nutrient needs related to cancer, upcoming surgery and adjuvant chemotherapy, radiation for lung cancer as evidenced by estimated needs ? ? ?MALNUTRITION DIAGNOSIS: none at this time ? ? ?INTERVENTION:  ?Encouraged high calorie and high protein diet to maintain weight prior to surgery. Handout provided ?Encouraged small frequent meals 4-6 times per day ?Encouraged low sugar oral nutrition supplement 2-3 times per day.  List of low sugar shakes provided ?Encouraged patient to be mindful of high sodium foods can cause fluid weight gain.  ?Offered LCSW for assistance with anxiety.  Patient declined at this time ?Contact information provided ? ? ?MONITORING, EVALUATION, GOAL: weight trends, intake ? ? ?Next Visit: Wed May 31, phone call ? ?Vidal Lampkins B. AZenia Resides RD, LDN ?Registered Dietitian ?[phone]? ? ? ? ? ?

## 2022-03-20 ENCOUNTER — Encounter: Payer: Self-pay | Admitting: Radiation Oncology

## 2022-03-20 ENCOUNTER — Other Ambulatory Visit: Payer: Medicare Other

## 2022-03-20 ENCOUNTER — Ambulatory Visit
Admission: RE | Admit: 2022-03-20 | Discharge: 2022-03-20 | Disposition: A | Discharge status: Home / Self Care | Payer: Medicare Other | Source: Ambulatory Visit | Attending: Radiation Oncology | Admitting: Radiation Oncology

## 2022-03-20 VITALS — BP 120/3 | HR 122 | Resp 18 | Ht 64.0 in | Wt 134.5 lb

## 2022-03-20 DIAGNOSIS — C3432 Malignant neoplasm of lower lobe, left bronchus or lung: Secondary | ICD-10-CM

## 2022-03-20 NOTE — Consult Note (Signed)
?NEW PATIENT EVALUATION ? ?Name: Jessica Stout  ?MRN: [id number]  ?Date:   03/20/2022     ?DOB: May 30, 1951 ? ? ?This 71 y.o. female patient presents to the clinic for initial evaluation of T1 non-small cell lung cancer favoring squamous cell carcinoma of the left lower lobe and patient with colon cancer scheduled for surgery in early May.. ? ?REFERRING PHYSICIAN: Albina Billet, MD ? ?CHIEF COMPLAINT:  ?Chief Complaint  ?Patient presents with  ? Consult  ?  Lung  ? ? ?DIAGNOSIS: There were no encounter diagnoses. ?  ?PREVIOUS INVESTIGATIONS:  ?PET CT scan and CT scans reviewed ?Pathology reports reviewed ?Clinical notes reviewed ? ?HPI: Patient is a 71 year old female who presented on CT scan for work-up of possible PE to have a 7.4 by 6 x 1 x 5.3 cm mass in the mid transverse colon.  She is also found to have a a 1.4 cm pulmonary mass in the superior segment of the left lower lobe concerning for metastasis.  Biopsy of her left lower lobe mass by CT-guided core biopsy was positive for non-small cell lung cancer favoring squamous cell carcinoma.  Biopsy of the colon mass was positive for invasive moderately differentiated adenocarcinoma.  She is scheduled for surgical resection at [facility] on May 12.  She is now seen for consideration of SBRT to her left lower lobe squamous cell carcinoma.  From a pulmonary standpoint she specifically denies cough hemoptysis or chest tightness.  She is having some occasional abdominal pain and discomfort. ? ?PLANNED TREATMENT REGIMEN: SBRT to left lower lobe ? ?PAST MEDICAL HISTORY:  has a past medical history of Anemia, Anxiety, Asthma, Cancer ([place]), CHF (congestive heart failure) ([place]), COPD (chronic obstructive pulmonary disease) ([place]), Diabetes mellitus without complication ([place]), Hyperlipidemia, Hypertension, and Stroke ([place]).   ? ?PAST SURGICAL HISTORY:  ?Past Surgical History:  ?Procedure Laterality Date  ? COLONOSCOPY WITH PROPOFOL N/A 02/03/2022  ? Procedure: COLONOSCOPY WITH PROPOFOL;   Surgeon: Lin Landsman, MD;  Location: [facility] ENDOSCOPY;  Service: Gastroenterology;  Laterality: N/A;  ? ESOPHAGOGASTRODUODENOSCOPY (EGD) WITH PROPOFOL N/A 02/03/2022  ? Procedure: ESOPHAGOGASTRODUODENOSCOPY (EGD) WITH PROPOFOL;  Surgeon: Lin Landsman, MD;  Location: [facility] ENDOSCOPY;  Service: Gastroenterology;  Laterality: N/A;  ? GIVENS CAPSULE STUDY N/A 02/03/2022  ? Procedure: GIVENS CAPSULE STUDY;  Surgeon: Lin Landsman, MD;  Location: [facility] ENDOSCOPY;  Service: Gastroenterology;  Laterality: N/A;  possible Capsule Study  ? ? ?FAMILY HISTORY: family history is not on file. ? ?SOCIAL HISTORY:  reports that she has been smoking cigarettes and e-cigarettes. She does not have any smokeless tobacco history on file. ? ?ALLERGIES: Codeine ? ?MEDICATIONS:  ?Current Outpatient Medications  ?Medication Sig Dispense Refill  ? acetaminophen (TYLENOL) 325 MG tablet Take 650 mg by mouth every 6 (six) hours as needed.    ? albuterol (VENTOLIN HFA) 108 (90 Base) MCG/ACT inhaler Inhale into the lungs.    ? atorvastatin (LIPITOR) 40 MG tablet Take 40 mg by mouth daily.    ? furosemide (LASIX) 40 MG tablet Take 40 mg by mouth daily.    ? gabapentin (NEURONTIN) 600 MG tablet Take by mouth 2 (two) times daily.    ? JANUMET 50-1000 MG tablet Take 1 tablet by mouth daily.    ? LANTUS SOLOSTAR 100 UNIT/ML Solostar Pen Inject 12 Units into the skin at bedtime.    ? losartan (COZAAR) 25 MG tablet Take 25 mg by mouth daily.    ? pantoprazole (PROTONIX) 40 MG tablet Take 1 tablet (  40 mg total) by mouth daily. 30 tablet 5  ? venlafaxine XR (EFFEXOR-XR) 75 MG 24 hr capsule Take 75 mg by mouth daily.    ? metoprolol succinate (TOPROL-XL) 25 MG 24 hr tablet Take 1 tablet (25 mg total) by mouth daily. (Patient not taking: Reported on 03/19/2022) 30 tablet 0  ? oxyCODONE (OXY IR/ROXICODONE) 5 MG immediate release tablet Take 1 tablet (5 mg total) by mouth every 4 (four) hours as needed for severe pain. 30 tablet 0  ? ?No current  facility-administered medications for this encounter.  ? ? ?ECOG PERFORMANCE STATUS:  0 - Asymptomatic ? ?REVIEW OF SYSTEMS: ?Patient denies any weight loss, fatigue, weakness, fever, chills or night sweats. Patient denies any loss of vision, blurred vision. Patient denies any ringing  of the ears or hearing loss. No irregular heartbeat. Patient denies heart murmur or history of fainting. Patient denies any chest pain or pain radiating to her upper extremities. Patient denies any shortness of breath, difficulty breathing at night, cough or hemoptysis. Patient denies any swelling in the lower legs. Patient denies any nausea vomiting, vomiting of blood, or coffee ground material in the vomitus. Patient denies any stomach pain. Patient states has had normal bowel movements no significant constipation or diarrhea. Patient denies any dysuria, hematuria or significant nocturia. Patient denies any problems walking, swelling in the joints or loss of balance. Patient denies any skin changes, loss of hair or loss of weight. Patient denies any excessive worrying or anxiety or significant depression. Patient denies any problems with insomnia. Patient denies excessive thirst, polyuria, polydipsia. Patient denies any swollen glands, patient denies easy bruising or easy bleeding. Patient denies any recent infections, allergies or URI. Patient "s visual fields have not changed significantly in recent time. ?  ?PHYSICAL EXAM: ?BP (!) 120/3   Pulse (!) 122   Resp 18   Ht 5\' 4"  (1.626 m)   Wt 134 lb 8 oz (61 kg)   BMI 23.09 kg/m?  ?Somewhat frail-appearing female in NAD.  Well-developed well-nourished patient in NAD. HEENT reveals PERLA, EOMI, discs not visualized.  Oral cavity is clear. No oral mucosal lesions are identified. Neck is clear without evidence of cervical or supraclavicular adenopathy. Lungs are clear to A&P. Cardiac examination is essentially unremarkable with regular rate and rhythm without murmur rub or thrill.  Abdomen is benign with no organomegaly or masses noted. Motor sensory and DTR levels are equal and symmetric in the upper and lower extremities. Cranial nerves II through XII are grossly intact. Proprioception is intact. No peripheral adenopathy or edema is identified. No motor or sensory levels are noted. Crude visual fields are within normal range. ? ?LABORATORY DATA: Pathology reports reviewed ? ?  ?RADIOLOGY RESULTS: CT scans and PET CT scans reviewed compatible with above-stated findings ? ? ?IMPRESSION: Stage I non-small cell lung cancer favoring squamous of carcinoma of the left lower lobe in 71 year old female scheduled to undergo transverse colon resection for locally advanced adenocarcinoma of the colon ? ?PLAN: At this time I like to fast-track her for her SBRT treatment to her left lower lobe as the patient would like to get that over with prior to her surgery.  I would plan on delivering 60 Gray in 5 fractions to her left lower lobe using SBRT.  Low side effect profile including possible fatigue and slight cough development all were reviewed with the patient.  I will simulate her tomorrow have her treatments completed by the time she is scheduled for surgery.  Patient has been both comprehend our treatment plan well.  I will use for dimensional treatment planning as well as motion restriction for treatment planning. ? ?I would like to take this opportunity to thank you for allowing me to participate in the care of your patient.. ? ?Noreene Filbert, MD ? ? ? ? ? ? ? ? ?

## 2022-03-20 NOTE — Progress Notes (Signed)
Tumor Board Documentation ? ?Jessica Stout was presented by Dr Grayland Ormond at our Tumor Board on 03/20/2022, which included representatives from radiology, pathology, medical oncology, surgical, pharmacy, genetics, research, navigation, radiation oncology, internal medicine. ? ?Jessica Stout currently presents as a current patient, for [place], for new positive pathology with history of the following treatments: active survellience. ? ?Additionally, we reviewed previous medical and familial history, history of present illness, and recent lab results along with all available histopathologic and imaging studies. The tumor board considered available treatment options and made the following recommendations: ?Surgery, Adjuvant chemotherapy, Radiation therapy (primary modality) (For Colon Cancer) ?  ? ?The following procedures/referrals were also placed: No orders of the defined types were placed in this encounter. ? ? ?Clinical Trial Status: not discussed  ? ?Staging used: To be determined, AJCC Stage Group ?AJCC Staging: ?T: Lung 1b       Colon TBD ?N: Lung 0      Colon TBD ?  ?Group: Lung Stage ! Squamous Cell Carcinoma LLL    Colon Adenocarcinoma TBD ? ? ?National site-specific guidelines NCCN were discussed with respect to the case. ? ?Tumor board is a meeting of clinicians from various specialty areas who evaluate and discuss patients for whom a multidisciplinary approach is being considered. Final determinations in the plan of care are those of the provider(s). The responsibility for follow up of recommendations given during tumor board is that of the provider.  ? ?Today?s extended care, comprehensive team conference, Jessica Stout was not present for the discussion and was not examined.  ? ?Multidisciplinary Tumor Board is a multidisciplinary case peer review process.  Decisions discussed in the Multidisciplinary Tumor Board reflect the opinions of the specialists present at the conference without having examined the patient.   Ultimately, treatment and diagnostic decisions rest with the primary provider(s) and the patient. ? ?

## 2022-03-21 ENCOUNTER — Ambulatory Visit
Admission: RE | Admit: 2022-03-21 | Discharge: 2022-03-21 | Disposition: A | Discharge status: Home / Self Care | Payer: Medicare Other | Source: Ambulatory Visit | Attending: Radiation Oncology | Admitting: Radiation Oncology

## 2022-03-21 DIAGNOSIS — Z510 Encounter for antineoplastic radiation therapy: Secondary | ICD-10-CM | POA: Insufficient documentation

## 2022-03-21 DIAGNOSIS — C3432 Malignant neoplasm of lower lobe, left bronchus or lung: Secondary | ICD-10-CM | POA: Diagnosis not present

## 2022-03-25 DIAGNOSIS — Z510 Encounter for antineoplastic radiation therapy: Secondary | ICD-10-CM | POA: Diagnosis not present

## 2022-03-26 ENCOUNTER — Ambulatory Visit
Admission: RE | Admit: 2022-03-26 | Discharge: 2022-03-26 | Disposition: A | Discharge status: Home / Self Care | Payer: Medicare Other | Source: Ambulatory Visit | Attending: Radiation Oncology | Admitting: Radiation Oncology

## 2022-03-26 ENCOUNTER — Telehealth: Payer: Self-pay | Admitting: *Deleted

## 2022-03-26 ENCOUNTER — Other Ambulatory Visit: Payer: Self-pay

## 2022-03-26 DIAGNOSIS — Z510 Encounter for antineoplastic radiation therapy: Secondary | ICD-10-CM | POA: Diagnosis not present

## 2022-03-26 LAB — RAD ONC ARIA SESSION SUMMARY
Course Elapsed Days: 0
Plan Fractions Treated to Date: 1
Plan Prescribed Dose Per Fraction: 12 Gy
Plan Total Fractions Prescribed: 5
Plan Total Prescribed Dose: 60 Gy
Reference Point Dosage Given to Date: 12 Gy
Reference Point Session Dosage Given: 12 Gy
Session Number: 1

## 2022-03-26 NOTE — Telephone Encounter (Signed)
Called [facility] and gave verbal ok for PT per Dr. Woodfin Ganja. She expressed understanding with no additional questions or concerns.  ?

## 2022-03-26 NOTE — Telephone Encounter (Signed)
Orders requested for PHYSICAL THERAPY 1 wk 1, 2 wk 4, 1 wk 4. Please advise if approved ?

## 2022-03-28 ENCOUNTER — Other Ambulatory Visit: Payer: Self-pay

## 2022-03-28 ENCOUNTER — Ambulatory Visit
Admission: RE | Admit: 2022-03-28 | Discharge: 2022-03-28 | Disposition: A | Discharge status: Home / Self Care | Payer: Medicare Other | Source: Ambulatory Visit | Attending: Radiation Oncology | Admitting: Radiation Oncology

## 2022-03-28 DIAGNOSIS — Z510 Encounter for antineoplastic radiation therapy: Secondary | ICD-10-CM | POA: Diagnosis not present

## 2022-03-28 LAB — RAD ONC ARIA SESSION SUMMARY
Course Elapsed Days: 2
Plan Fractions Treated to Date: 2
Plan Prescribed Dose Per Fraction: 12 Gy
Plan Total Fractions Prescribed: 5
Plan Total Prescribed Dose: 60 Gy
Reference Point Dosage Given to Date: 24 Gy
Reference Point Session Dosage Given: 12 Gy
Session Number: 2

## 2022-04-01 ENCOUNTER — Other Ambulatory Visit: Payer: Self-pay

## 2022-04-01 ENCOUNTER — Ambulatory Visit
Admission: RE | Admit: 2022-04-01 | Discharge: 2022-04-01 | Disposition: A | Discharge status: Home / Self Care | Payer: Medicare Other | Source: Ambulatory Visit | Attending: Radiation Oncology | Admitting: Radiation Oncology

## 2022-04-01 DIAGNOSIS — Z510 Encounter for antineoplastic radiation therapy: Secondary | ICD-10-CM | POA: Insufficient documentation

## 2022-04-01 DIAGNOSIS — C3432 Malignant neoplasm of lower lobe, left bronchus or lung: Secondary | ICD-10-CM | POA: Insufficient documentation

## 2022-04-01 LAB — RAD ONC ARIA SESSION SUMMARY
Course Elapsed Days: 6
Plan Fractions Treated to Date: 3
Plan Prescribed Dose Per Fraction: 12 Gy
Plan Total Fractions Prescribed: 5
Plan Total Prescribed Dose: 60 Gy
Reference Point Dosage Given to Date: 36 Gy
Reference Point Session Dosage Given: 12 Gy
Session Number: 3

## 2022-04-03 ENCOUNTER — Other Ambulatory Visit: Payer: Self-pay

## 2022-04-03 ENCOUNTER — Ambulatory Visit
Admission: RE | Admit: 2022-04-03 | Discharge: 2022-04-03 | Disposition: A | Discharge status: Home / Self Care | Payer: Medicare Other | Source: Ambulatory Visit | Attending: Radiation Oncology | Admitting: Radiation Oncology

## 2022-04-03 ENCOUNTER — Ambulatory Visit: Payer: Medicare Other

## 2022-04-03 DIAGNOSIS — Z510 Encounter for antineoplastic radiation therapy: Secondary | ICD-10-CM | POA: Diagnosis not present

## 2022-04-03 LAB — RAD ONC ARIA SESSION SUMMARY
Course Elapsed Days: 8
Plan Fractions Treated to Date: 4
Plan Prescribed Dose Per Fraction: 12 Gy
Plan Total Fractions Prescribed: 5
Plan Total Prescribed Dose: 60 Gy
Reference Point Dosage Given to Date: 48 Gy
Reference Point Session Dosage Given: 12 Gy
Session Number: 4

## 2022-04-07 ENCOUNTER — Other Ambulatory Visit: Payer: Self-pay

## 2022-04-07 ENCOUNTER — Ambulatory Visit
Admission: RE | Admit: 2022-04-07 | Discharge: 2022-04-07 | Disposition: A | Discharge status: Home / Self Care | Payer: Medicare Other | Source: Ambulatory Visit | Attending: Radiation Oncology | Admitting: Radiation Oncology

## 2022-04-07 ENCOUNTER — Ambulatory Visit: Payer: Medicare Other

## 2022-04-07 DIAGNOSIS — Z510 Encounter for antineoplastic radiation therapy: Secondary | ICD-10-CM | POA: Diagnosis not present

## 2022-04-07 LAB — RAD ONC ARIA SESSION SUMMARY
Course Elapsed Days: 12
Plan Fractions Treated to Date: 5
Plan Prescribed Dose Per Fraction: 12 Gy
Plan Total Fractions Prescribed: 5
Plan Total Prescribed Dose: 60 Gy
Reference Point Dosage Given to Date: 60 Gy
Reference Point Session Dosage Given: 12 Gy
Session Number: 5

## 2022-04-09 ENCOUNTER — Ambulatory Visit: Payer: Medicare Other

## 2022-04-18 ENCOUNTER — Telehealth: Payer: Self-pay

## 2022-04-18 NOTE — Telephone Encounter (Signed)
error 

## 2022-04-24 ENCOUNTER — Ambulatory Visit: Payer: Medicare Other | Admitting: Oncology

## 2022-04-29 ENCOUNTER — Inpatient Hospital Stay: Payer: Medicare Other | Attending: Oncology | Admitting: Hospice and Palliative Medicine

## 2022-04-29 DIAGNOSIS — Z515 Encounter for palliative care: Secondary | ICD-10-CM

## 2022-04-29 NOTE — Progress Notes (Signed)
I spoke with patient's husband by phone.  Patient was asleep.  Reportedly, patient is doing reasonably well.  No concerns or changes were reported by husband.

## 2022-04-30 ENCOUNTER — Inpatient Hospital Stay: Payer: Medicare Other

## 2022-04-30 NOTE — Progress Notes (Signed)
Nutrition Follow-up:  Patient with colon cancer and stage I squamous cell carcinoma of lung.  Surgery on colon has been moved to 6/23.    Spoke with patient via phone.  Completed radiation to lung on 5/8.  Says that her appetite is good and she is eating 3 meals per day plus snacks.  Eating a variety of foods. Including protein foods, mostly chicken, fish, cottage cheese, yogurt. Drinks glucerna shakes 3 times per day.  Says that her sister comes on the weekend and prepares meals for the week.  Denies nausea. Reports loose stool 2-3 times per day.      Medications: reviewed  Labs: reviewed  Anthropometrics:   135-136 lb on home scale per patient  133 lb on 4/19   NUTRITION DIAGNOSIS: Increased nutrient needs continues    INTERVENTION:  Continue high protein foods prior to surgery Continue glucerna shakes for added nutrition    MONITORING, EVALUATION, GOAL: weight trends, intake   NEXT VISIT: phone July 18 (Tuesday)  Nyana Haren B. Zenia Resides, [place], [place] Registered Dietitian [phone]

## 2022-05-07 ENCOUNTER — Encounter: Payer: Self-pay | Admitting: Radiation Oncology

## 2022-05-07 ENCOUNTER — Ambulatory Visit
Admission: RE | Admit: 2022-05-07 | Discharge: 2022-05-07 | Disposition: A | Discharge status: Home / Self Care | Payer: Medicare Other | Source: Ambulatory Visit | Attending: Radiation Oncology | Admitting: Radiation Oncology

## 2022-05-07 VITALS — BP 117/67 | HR 80 | Temp 97.0°F | Resp 18 | Ht 64.0 in | Wt 138.8 lb

## 2022-05-07 DIAGNOSIS — C3432 Malignant neoplasm of lower lobe, left bronchus or lung: Secondary | ICD-10-CM | POA: Insufficient documentation

## 2022-05-07 DIAGNOSIS — Z923 Personal history of irradiation: Secondary | ICD-10-CM | POA: Diagnosis not present

## 2022-05-07 NOTE — Progress Notes (Signed)
Radiation Oncology Follow up Note  Name: Jessica Stout   Date:   05/07/2022 MRN:  [id number] DOB: 1951/05/09    This 71 y.o. 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  She is also scheduled for rectal surgery in about a month's time.  She feels well specifically [place] cough hemoptysis chest tightness or any change in her pulmonary status..  COMPLICATIONS OF TREATMENT: none  FOLLOW UP COMPLIANCE: keeps appointments   PHYSICAL EXAM:  BP 117/67   Pulse 80   Temp (!) 97 F (36.1 C)   Resp 18   Ht 5\' 4"  (1.626 m)   Wt 138 lb 12.8 oz (63 kg)   BMI 23.82 kg/m  Well-developed well-nourished patient in NAD. HEENT reveals PERLA, EOMI, discs not visualized.  Oral cavity is clear. No oral mucosal lesions are identified. Neck is clear without evidence of cervical or supraclavicular adenopathy. Lungs are clear to A&P. Cardiac examination is essentially unremarkable with regular rate and rhythm without murmur rub or thrill. Abdomen is benign with no organomegaly or masses noted. Motor sensory and DTR levels are equal and symmetric in the upper and lower extremities. Cranial nerves II through XII are grossly intact. Proprioception is intact. No peripheral adenopathy or edema is identified. No motor or sensory levels are noted. Crude visual fields are within normal range.  RADIOLOGY RESULTS: No current films were reviewed CT scan of the chest ordered  PLAN: Present time patient is doing well very low side effect profile from SBRT.  And pleased with her overall progress.  I have asked to see her back in 3 months for follow-up with a repeat CT scan of the chest at that time.   Patient knows to call with any concerns.  I would like to take this opportunity to thank you for allowing me to participate in the care of your patient.Noreene Filbert, MD

## 2022-06-10 ENCOUNTER — Telehealth: Payer: Self-pay | Admitting: *Deleted

## 2022-06-10 ENCOUNTER — Telehealth: Payer: Self-pay

## 2022-06-10 ENCOUNTER — Inpatient Hospital Stay: Payer: Medicare Other | Attending: Oncology | Admitting: Oncology

## 2022-06-10 VITALS — BP 137/68 | HR 87 | Temp 97.3°F | Resp 18 | Wt 137.9 lb

## 2022-06-10 DIAGNOSIS — C189 Malignant neoplasm of colon, unspecified: Secondary | ICD-10-CM | POA: Diagnosis present

## 2022-06-10 DIAGNOSIS — Z5111 Encounter for antineoplastic chemotherapy: Secondary | ICD-10-CM | POA: Insufficient documentation

## 2022-06-10 DIAGNOSIS — I110 Hypertensive heart disease with heart failure: Secondary | ICD-10-CM | POA: Diagnosis not present

## 2022-06-10 DIAGNOSIS — Z923 Personal history of irradiation: Secondary | ICD-10-CM | POA: Diagnosis not present

## 2022-06-10 DIAGNOSIS — F17290 Nicotine dependence, other tobacco product, uncomplicated: Secondary | ICD-10-CM | POA: Diagnosis not present

## 2022-06-10 DIAGNOSIS — C184 Malignant neoplasm of transverse colon: Secondary | ICD-10-CM | POA: Diagnosis not present

## 2022-06-10 DIAGNOSIS — I509 Heart failure, unspecified: Secondary | ICD-10-CM | POA: Diagnosis not present

## 2022-06-10 DIAGNOSIS — C3432 Malignant neoplasm of lower lobe, left bronchus or lung: Secondary | ICD-10-CM | POA: Insufficient documentation

## 2022-06-10 DIAGNOSIS — E119 Type 2 diabetes mellitus without complications: Secondary | ICD-10-CM | POA: Diagnosis not present

## 2022-06-10 MED ORDER — PROCHLORPERAZINE MALEATE 10 MG PO TABS: 10.0000 mg | ORAL_TABLET | Freq: Four times a day (QID) | ORAL | 2 refills | Status: DC | PRN

## 2022-06-10 MED ORDER — LIDOCAINE-PRILOCAINE 2.5-2.5 % EX CREA: TOPICAL_CREAM | CUTANEOUS | 3 refills | Status: DC

## 2022-06-10 MED ORDER — ONDANSETRON HCL 8 MG PO TABS: 8.0000 mg | ORAL_TABLET | Freq: Two times a day (BID) | ORAL | 2 refills | Status: DC | PRN

## 2022-06-10 NOTE — Progress Notes (Signed)
Patient here today for follow up today regarding colon cancer and lung cancer, post surgery follow up.

## 2022-06-10 NOTE — Progress Notes (Signed)
START ON PATHWAY REGIMEN - Colorectal     A cycle is every 14 days:     Oxaliplatin      Leucovorin      Fluorouracil      Fluorouracil   **Always confirm dose/schedule in your pharmacy ordering system**  Patient Characteristics: Postoperative without Neoadjuvant Therapy, M0 (Pathologic Staging), Colon, Stage III, High Risk (pT4 or pN2) Tumor Location: Colon Therapeutic Status: Postoperative without Neoadjuvant Therapy, M0 (Pathologic Staging) AJCC M Category: cM0 AJCC T Category: pT4b AJCC N Category: pN1c AJCC 8 Stage Grouping: IIIC Intent of Therapy: Curative Intent, Discussed with Patient

## 2022-06-10 NOTE — Telephone Encounter (Signed)
Port placement form faxed over to IR scheduling

## 2022-06-10 NOTE — Progress Notes (Signed)
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  INTERVAL HISTORY: Patient returns to clinic today postoperatively to discuss her final pathology results and treatment planning.  She underwent surgical resection at [facility] on May 23, 2022.  She is now fully recovered and feels significantly better than preop.  She has no neurologic complaints.  She denies any recent fevers.  She has a fair appetite, but denies weight loss.  She has no chest pain, shortness of breath, cough, or hemoptysis.  She denies any nausea, vomiting, constipation, or diarrhea.  She has no melena or hematochezia.  Patient offers no further specific complaints today.  REVIEW OF SYSTEMS:   Review of Systems  Constitutional: Negative.  Negative for fever, malaise/fatigue and weight loss.  Respiratory: Negative.  Negative for cough, hemoptysis and shortness of breath.   Cardiovascular: Negative.  Negative for chest pain and leg swelling.  Gastrointestinal: Negative.  Negative for abdominal pain, blood in stool, constipation, diarrhea, melena, nausea and vomiting.  Genitourinary: Negative.  Negative for dysuria.  Musculoskeletal: Negative.  Negative for back pain.  Skin: Negative.  Negative for rash.  Neurological: Negative.  Negative for dizziness, focal weakness, weakness and headaches.  Psychiatric/Behavioral: Negative.  The patient is not nervous/anxious.     As per HPI. Otherwise, a complete review of systems is negative.  PAST MEDICAL HISTORY: Past Medical History:  Diagnosis Date   Anemia    Anxiety    Asthma    Cancer ([place])    colon   CHF (congestive heart failure) (HCC)    COPD (chronic  obstructive pulmonary disease) ([place])    Diabetes mellitus without complication ([place])    Hyperlipidemia    Hypertension    Stroke ([place])     PAST SURGICAL HISTORY: Past Surgical History:  Procedure Laterality Date   COLONOSCOPY WITH PROPOFOL N/A 02/03/2022   Procedure: COLONOSCOPY WITH PROPOFOL;  Surgeon: Lin Landsman, MD;  Location: [facility] ENDOSCOPY;  Service: Gastroenterology;  Laterality: N/A;   ESOPHAGOGASTRODUODENOSCOPY (EGD) WITH PROPOFOL N/A 02/03/2022   Procedure: ESOPHAGOGASTRODUODENOSCOPY (EGD) WITH PROPOFOL;  Surgeon: Lin Landsman, MD;  Location: [facility] ENDOSCOPY;  Service: Gastroenterology;  Laterality: N/A;   GIVENS CAPSULE STUDY N/A 02/03/2022   Procedure: GIVENS CAPSULE STUDY;  Surgeon: Lin Landsman, MD;  Location: [facility] ENDOSCOPY;  Service: Gastroenterology;  Laterality: N/A;  possible Capsule Study    FAMILY HISTORY: No family history on file.  ADVANCED DIRECTIVES (Y/N):  N  HEALTH MAINTENANCE: Social History   Tobacco Use   Smoking status: Some Days    Types: Cigarettes, E-cigarettes    Last attempt to quit: 01/01/2022    Years since quitting: 0.4     Colonoscopy:  PAP:  Bone density:  Lipid panel:  Allergies  Allergen Reactions   Codeine Rash    Happened in 1970s    Current Outpatient Medications  Medication Sig Dispense Refill   acetaminophen (TYLENOL) 325 MG tablet Take 650 mg by mouth every 6 (six) hours as needed.     albuterol (VENTOLIN HFA) 108 (90 Base) MCG/ACT inhaler Inhale into the lungs.     atorvastatin (LIPITOR) 40 MG tablet Take 40 mg by mouth daily.     enoxaparin (LOVENOX) 40 MG/0.4ML injection  Inject into the skin.     furosemide (LASIX) 40 MG tablet Take 40 mg by mouth daily.     gabapentin (NEURONTIN) 600 MG tablet Take by mouth 2 (two) times daily.     LANTUS SOLOSTAR 100 UNIT/ML Solostar Pen Inject 6 Units into the skin at bedtime.     oxyCODONE (OXY IR/ROXICODONE) 5 MG immediate release tablet Take 1 tablet (5 mg  total) by mouth every 4 (four) hours as needed for severe pain. 30 tablet 0   ranolazine (RANEXA) 500 MG 12 hr tablet Take 500 mg by mouth 2 (two) times daily.     venlafaxine XR (EFFEXOR-XR) 75 MG 24 hr capsule Take 75 mg by mouth daily.     metoprolol succinate (TOPROL-XL) 25 MG 24 hr tablet Take 1 tablet (25 mg total) by mouth daily. (Patient not taking: Reported on 03/19/2022) 30 tablet 0   pantoprazole (PROTONIX) 40 MG tablet Take 1 tablet (40 mg total) by mouth daily. 30 tablet 5   No current facility-administered medications for this visit.    OBJECTIVE: Vitals:   06/10/22 1008  BP: 137/68  Pulse: 87  Resp: 18  Temp: (!) 97.3 F (36.3 C)  SpO2: 97%      Body mass index is 23.67 kg/m.    ECOG FS:1 - Symptomatic but completely ambulatory  General: Well-developed, well-nourished, no acute distress. Eyes: Pink conjunctiva, anicteric sclera. HEENT: Normocephalic, moist mucous membranes. Lungs: No audible wheezing or coughing. Heart: Regular rate and rhythm. Abdomen: Soft, nontender, no obvious distention.  Large, well-healed midline scar. Musculoskeletal: No edema, cyanosis, or clubbing. Neuro: Alert, answering all questions appropriately. Cranial nerves grossly intact. Skin: No rashes or petechiae noted. Psych: Normal affect.  LAB RESULTS:  Lab Results  Component Value Date   NA 142 02/05/2022   K 3.5 02/05/2022   CL 100 02/05/2022   CO2 32 02/05/2022   GLUCOSE 137 (H) 02/05/2022   BUN 16 02/05/2022   CREATININE 0.65 02/05/2022   CALCIUM 8.9 02/05/2022   PROT 5.9 (L) 02/04/2022   ALBUMIN 2.9 (L) 02/04/2022   AST 14 (L) 02/04/2022   ALT 12 02/04/2022   ALKPHOS 46 02/04/2022   BILITOT 0.5 02/04/2022   GFRNONAA >60 02/05/2022    Lab Results  Component Value Date   WBC 12.0 (H) 03/07/2022   NEUTROABS 7.0 02/12/2022   HGB 11.1 (L) 03/07/2022   HCT 36.9 03/07/2022   MCV 78.5 (L) 03/07/2022   PLT 262 03/07/2022     STUDIES: No results found.  ASSESSMENT:  Stage IIIc colon cancer.  PLAN:    Stage IIIc colon cancer: Patient underwent complete surgical resection at [facility] on May 23, 2022.  Large colon mass was noted to partially invade stomach as well as surrounding mesentery.  PET scan did not reveal any metastatic disease other than a positive lung nodule which was confirmed to be a second primary.  Patient's preop CEA was 171.  Patient will require adjuvant chemotherapy using FOLFOX every 2 weeks x 12.  She will also require port placement prior to initiating treatment.  Return to clinic in 2 weeks for further evaluation and consideration of cycle 1.   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  She has now completed XRT to this lesion.  Continue monitoring with CT scan every 6 months.   Pain: Essentially resolved.  Patient only uses oxycodone sparingly.  I spent a total of  30 minutes reviewing chart data, face-to-face evaluation with the patient, counseling and coordination of care as detailed above.   Patient expressed understanding and was in agreement with this plan. She also understands that She can call clinic at any time with any questions, concerns, or complaints.    Cancer Staging  Colon cancer [facility]) Staging form: Colon and Rectum, AJCC 8th Edition - Pathologic stage from 06/10/2022: Stage IIIC (pT4b, pN1c, cM0) - Signed by Lloyd Huger, MD on 06/10/2022 Stage prefix: Initial diagnosis Total positive nodes: 0 Histologic grading system: 4 grade system Histologic grade (G): G3   Lloyd Huger, MD   06/10/2022 3:14 PM

## 2022-06-10 NOTE — Telephone Encounter (Signed)
Called patient and husband Jessica Stout to update on appointment for port placement. Port placement is scheduled for 7/19 at 1:30, patient to arrive in the medical mall at 12:30. Patients husband verbalized understanding of appointment details.

## 2022-06-13 NOTE — Progress Notes (Signed)
Patient on schedule for Port placement 06/18/2022, called and spoke with Cecil/husband on phone with pre procedure instructions given. Made aware to be here @ 1230, NPO after 0630 light breakfast, and driver post procedure/recovery/discharge. Stated understanding.

## 2022-06-17 ENCOUNTER — Other Ambulatory Visit: Payer: Self-pay | Admitting: Radiology

## 2022-06-17 ENCOUNTER — Inpatient Hospital Stay: Payer: Medicare Other | Admitting: Hospice and Palliative Medicine

## 2022-06-17 ENCOUNTER — Inpatient Hospital Stay: Payer: Medicare Other

## 2022-06-18 ENCOUNTER — Other Ambulatory Visit: Payer: Self-pay

## 2022-06-18 ENCOUNTER — Ambulatory Visit
Admission: RE | Admit: 2022-06-18 | Discharge: 2022-06-18 | Disposition: A | Discharge status: Home / Self Care | Payer: Medicare Other | Source: Ambulatory Visit | Attending: Oncology | Admitting: Oncology

## 2022-06-18 ENCOUNTER — Encounter: Payer: Self-pay | Admitting: Radiology

## 2022-06-18 DIAGNOSIS — C184 Malignant neoplasm of transverse colon: Secondary | ICD-10-CM | POA: Diagnosis present

## 2022-06-18 DIAGNOSIS — Z85118 Personal history of other malignant neoplasm of bronchus and lung: Secondary | ICD-10-CM | POA: Insufficient documentation

## 2022-06-18 DIAGNOSIS — E785 Hyperlipidemia, unspecified: Secondary | ICD-10-CM | POA: Diagnosis not present

## 2022-06-18 DIAGNOSIS — E119 Type 2 diabetes mellitus without complications: Secondary | ICD-10-CM | POA: Diagnosis not present

## 2022-06-18 DIAGNOSIS — Z923 Personal history of irradiation: Secondary | ICD-10-CM | POA: Diagnosis not present

## 2022-06-18 DIAGNOSIS — I10 Essential (primary) hypertension: Secondary | ICD-10-CM | POA: Insufficient documentation

## 2022-06-18 HISTORY — PX: IR IMAGING GUIDED PORT INSERTION: IMG5740

## 2022-06-18 LAB — GLUCOSE, CAPILLARY: Glucose-Capillary: 188 mg/dL — ABNORMAL HIGH (ref 70–99)

## 2022-06-18 MED ORDER — LIDOCAINE-EPINEPHRINE 1 %-1:100000 IJ SOLN
INTRAMUSCULAR | Status: AC
  Administered 2022-06-18: 8 mL
  Filled 2022-06-18: qty 1

## 2022-06-18 MED ORDER — FENTANYL CITRATE (PF) 100 MCG/2ML IJ SOLN
INTRAMUSCULAR | Status: AC
  Filled 2022-06-18: qty 4

## 2022-06-18 MED ORDER — SODIUM CHLORIDE 0.9 % IV SOLN: INTRAVENOUS | Status: DC

## 2022-06-18 MED ORDER — HEPARIN SOD (PORK) LOCK FLUSH 100 UNIT/ML IV SOLN
INTRAVENOUS | Status: AC
  Administered 2022-06-18: 500 [IU]
  Filled 2022-06-18: qty 5

## 2022-06-18 MED ORDER — FENTANYL CITRATE (PF) 100 MCG/2ML IJ SOLN
INTRAMUSCULAR | Status: AC | PRN
  Administered 2022-06-18: 50 ug via INTRAVENOUS

## 2022-06-18 NOTE — OR Nursing (Signed)
Pt said she ate breakfast at 10 am, she thought she could have a light breakfast but forgot it was before 6:30 am

## 2022-06-18 NOTE — H&P (Signed)
Chief Complaint: Patient was seen in consultation today for port a catheter placement at the request of Stout,Jessica J  Referring Physician(s): Stout,Jessica J  Supervising Physician: Jessica Stout  Patient Status: [facility] - Out-pt  History of Present Illness: Jessica Stout is a 71 y.o. female with PMHx significant for stage IIIc colon cancer s/p surgical resection and awaiting chemotherapy. Patient also with second primary cancer stage I left lower lobe squamous cell of the lung and has completed radiation. The patient follows with oncology and request received for port a catheter placement to begin chemotherapy.   The patient denies any current chest pain or shortness of breath. She states overall she feels extremely well and is ambulating well and cooking for herself at home. The patient denies any recent infections, fever or chills. The patient denies any history of sleep apnea or chronic oxygen use.   Past Medical History:  Diagnosis Date   Anemia    Anxiety    Asthma    Cancer ([place])    colon   CHF (congestive heart failure) (HCC)    COPD (chronic obstructive pulmonary disease) ([place])    Diabetes mellitus without complication ([place])    Hyperlipidemia    Hypertension    Stroke [facility])     Past Surgical History:  Procedure Laterality Date   COLONOSCOPY WITH PROPOFOL N/A 02/03/2022   Procedure: COLONOSCOPY WITH PROPOFOL;  Surgeon: Lin Landsman, MD;  Location: [facility] ENDOSCOPY;  Service: Gastroenterology;  Laterality: N/A;   ESOPHAGOGASTRODUODENOSCOPY (EGD) WITH PROPOFOL N/A 02/03/2022   Procedure: ESOPHAGOGASTRODUODENOSCOPY (EGD) WITH PROPOFOL;  Surgeon: Lin Landsman, MD;  Location: [facility] ENDOSCOPY;  Service: Gastroenterology;  Laterality: N/A;   GIVENS CAPSULE STUDY N/A 02/03/2022   Procedure: GIVENS CAPSULE STUDY;  Surgeon: Lin Landsman, MD;  Location: [facility] ENDOSCOPY;  Service: Gastroenterology;  Laterality: N/A;  possible Capsule Study    Allergies: Codeine  Medications: Prior to Admission medications   Medication Sig Start Date End Date Taking? Authorizing Provider  acetaminophen (TYLENOL) 325 MG tablet Take 650 mg by mouth every 6 (six) hours as needed.    [provider]  albuterol (VENTOLIN HFA) 108 (90 Base) MCG/ACT inhaler Inhale into the lungs. 01/19/22   [provider]  atorvastatin (LIPITOR) 40 MG tablet Take 40 mg by mouth daily. 12/12/21   [provider]  enoxaparin (LOVENOX) 40 MG/0.4ML injection Inject into the skin. 05/27/22   [provider]  furosemide (LASIX) 40 MG tablet Take 40 mg by mouth daily. 01/23/22   [provider]  gabapentin (NEURONTIN) 600 MG tablet Take by mouth 2 (two) times daily. 01/01/22   [provider]  LANTUS SOLOSTAR 100 UNIT/ML Solostar Pen Inject 6 Units into the skin at bedtime. 01/17/22   [provider]  lidocaine-prilocaine (EMLA) cream Apply to affected area once 06/10/22   Lloyd Huger, MD  metoprolol succinate (TOPROL-XL) 25 MG 24 hr tablet Take 1 tablet (25 mg total) by mouth daily. Patient not taking: Reported on 03/19/2022 02/06/22 03/13/22  Wyvonnia Dusky, MD  ondansetron [facility]) 8 MG tablet Take 1 tablet (8 mg total) by mouth 2 (two) times daily as needed for refractory nausea / vomiting. 06/10/22   Lloyd Huger, MD  oxyCODONE (OXY IR/ROXICODONE) 5 MG immediate release tablet Take 1 tablet (5 mg total) by mouth every 4 (four) hours as needed for severe pain. 02/13/22   Lloyd Huger, MD  pantoprazole (PROTONIX) 40 MG tablet Take 1 tablet (40 mg total) by mouth  daily. 03/11/22 04/10/22  Alisa Graff, FNP  prochlorperazine (COMPAZINE) 10 MG tablet Take 1 tablet (10 mg total) by mouth every 6 (six) hours as needed (Nausea or vomiting). 06/10/22   Lloyd Huger, MD  ranolazine (RANEXA) 500 MG 12 hr tablet Take 500 mg by mouth 2 (two) times daily.    [provider]  venlafaxine XR  (EFFEXOR-XR) 75 MG 24 hr capsule Take 75 mg by mouth daily. 12/07/21   [provider]     No family history on file.  Social History   Socioeconomic History   Marital status: Married    Spouse name: Not on file   Number of children: Not on file   Years of education: Not on file   Highest education level: Not on file  Occupational History   Not on file  Tobacco Use   Smoking status: Some Days    Types: Cigarettes, E-cigarettes    Last attempt to quit: 01/01/2022    Years since quitting: 0.4   Smokeless tobacco: Not on file  Substance and Sexual Activity   Alcohol use: Not on file   Drug use: Not on file   Sexual activity: Not on file  Other Topics Concern   Not on file  Social History Narrative   Not on file   Social Determinants of Health   Financial Resource Strain: Not on file  Food Insecurity: Not on file  Transportation Needs: Not on file  Physical Activity: Not on file  Stress: Not on file  Social Connections: Not on file    Review of Systems: A 12 point ROS discussed and pertinent positives are indicated in the HPI above.  All other systems are negative.  Review of Systems  Vital Signs: waiting for RN to obtain at time of note There were no vitals taken for this visit.  Physical Exam Constitutional:      General: She is not in acute distress. HENT:     Head: Normocephalic and atraumatic.  Cardiovascular:     Rate and Rhythm: Normal rate and regular rhythm.  Pulmonary:     Effort: Pulmonary effort is normal. No respiratory distress.  Abdominal:     Palpations: Abdomen is soft.  Skin:    General: Skin is warm and dry.  Neurological:     Mental Status: She is alert and oriented to person, place, and time.     Imaging: No results found.  Labs:  CBC: Recent Labs    02/03/22 0522 02/04/22 0658 02/12/22 1013 03/07/22 0755  WBC 6.4 8.8 8.9 12.0*  HGB 9.0* 9.2* 11.6* 11.1*  HCT 31.1* 32.2* 40.2 36.9  PLT 274 289 256 262     COAGS: Recent Labs    03/07/22 0755  INR 1.0    BMP: Recent Labs    02/02/22 0520 02/03/22 0522 02/04/22 0658 02/05/22 0628  NA 141 139 141 142  K 4.0 4.0 3.8 3.5  CL 101 101 100 100  CO2 31 30 31  32  GLUCOSE 124* 122* 129* 137*  BUN 16 17 17 16   CALCIUM 8.9 8.7* 8.8* 8.9  CREATININE 0.54 0.76 0.74 0.65  GFRNONAA >60 >60 >60 >60    LIVER FUNCTION TESTS: Recent Labs    01/29/22 1124 02/04/22 0658  BILITOT 0.3 0.5  AST 14* 14*  ALT 9 12  ALKPHOS 41 46  PROT 6.3* 5.9*  ALBUMIN 3.0* 2.9*    Assessment and Plan: This is a 71 year old female with PMHx significant  for stage IIIc colon cancer s/p surgical resection and awaiting chemotherapy. Patient also with second primary cancer stage I left lower lobe squamous cell of the lung and has completed radiation. The patient follows with oncology and request received for port a catheter placement to begin chemotherapy.   The patient has not been NPO and had a biscuit at 10am therefore we will proceed today with local anesthesia and fentanyl only- patient is aware and agrees with the plan, imaging, labs and vitals have been reviewed.  Risks and benefits of image guided port-a-catheter placement was discussed with the patient including, but not limited to bleeding, infection, pneumothorax, or fibrin sheath development and need for additional procedures.  All of the patient's questions were answered, patient is agreeable to proceed. Consent signed and in chart.   Thank you for this interesting consult.  I greatly enjoyed meeting Havannah Streat and look forward to participating in their care.  A copy of this report was sent to the requesting provider on this date.  Electronically Signed: Hedy Jacob, PA-C 06/18/2022, 1:06 PM   I spent a total of 10 Minutes in face to face in clinical consultation, greater than 50% of which was counseling/coordinating care for port a catheter placement.

## 2022-06-18 NOTE — Procedures (Signed)
Pre Procedure Dx: Poor venous access Post Procedural Dx: Same  Successful placement of right IJ approach port-a-cath with tip at the superior caval atrial junction. The catheter is ready for immediate use.  Estimated Blood Loss: Minimal  Complications: None immediate.  Jay Giulio Bertino, MD Pager #: [phone]   

## 2022-06-19 ENCOUNTER — Inpatient Hospital Stay: Payer: Medicare Other

## 2022-06-23 NOTE — Progress Notes (Unsigned)
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  INTERVAL HISTORY: Patient returns to clinic today postoperatively to discuss her final pathology results and treatment planning.  She underwent surgical resection at [facility] on May 23, 2022.  She is now fully recovered and feels significantly better than preop.  She has no neurologic complaints.  She denies any recent fevers.  She has a fair appetite, but denies weight loss.  She has no chest pain, shortness of breath, cough, or hemoptysis.  She denies any nausea, vomiting, constipation, or diarrhea.  She has no melena or hematochezia.  Patient offers no further specific complaints today.  REVIEW OF SYSTEMS:   Review of Systems  Constitutional: Negative.  Negative for fever, malaise/fatigue and weight loss.  Respiratory: Negative.  Negative for cough, hemoptysis and shortness of breath.   Cardiovascular: Negative.  Negative for chest pain and leg swelling.  Gastrointestinal: Negative.  Negative for abdominal pain, blood in stool, constipation, diarrhea, melena, nausea and vomiting.  Genitourinary: Negative.  Negative for dysuria.  Musculoskeletal: Negative.  Negative for back pain.  Skin: Negative.  Negative for rash.  Neurological: Negative.  Negative for dizziness, focal weakness, weakness and headaches.  Psychiatric/Behavioral: Negative.  The patient is not nervous/anxious.     As per HPI. Otherwise, a complete review of systems is negative.  PAST MEDICAL HISTORY: Past Medical History:  Diagnosis Date   Anemia    Anxiety    Asthma    Cancer ([place])    colon   CHF (congestive heart failure) (HCC)    COPD (chronic  obstructive pulmonary disease) ([place])    Diabetes mellitus without complication ([place])    Hyperlipidemia    Hypertension    Stroke ([place])     PAST SURGICAL HISTORY: Past Surgical History:  Procedure Laterality Date   COLONOSCOPY WITH PROPOFOL N/A 02/03/2022   Procedure: COLONOSCOPY WITH PROPOFOL;  Surgeon: Lin Landsman, MD;  Location: [facility] ENDOSCOPY;  Service: Gastroenterology;  Laterality: N/A;   ESOPHAGOGASTRODUODENOSCOPY (EGD) WITH PROPOFOL N/A 02/03/2022   Procedure: ESOPHAGOGASTRODUODENOSCOPY (EGD) WITH PROPOFOL;  Surgeon: Lin Landsman, MD;  Location: [facility] ENDOSCOPY;  Service: Gastroenterology;  Laterality: N/A;   GIVENS CAPSULE STUDY N/A 02/03/2022   Procedure: GIVENS CAPSULE STUDY;  Surgeon: Lin Landsman, MD;  Location: [facility] ENDOSCOPY;  Service: Gastroenterology;  Laterality: N/A;  possible Capsule Study   IR IMAGING GUIDED PORT INSERTION  06/18/2022    FAMILY HISTORY: No family history on file.  ADVANCED DIRECTIVES (Y/N):  N  HEALTH MAINTENANCE: Social History   Tobacco Use   Smoking status: Every Day    Types: E-cigarettes  Substance Use Topics   Alcohol use: Yes    Comment: rare     Colonoscopy:  PAP:  Bone density:  Lipid panel:  Allergies  Allergen Reactions   Codeine Rash    Happened in 1970s    Current Outpatient Medications  Medication Sig Dispense Refill   acetaminophen (TYLENOL) 325 MG tablet Take 650 mg by mouth every 6 (six) hours as needed.     albuterol (VENTOLIN HFA) 108 (90 Base) MCG/ACT inhaler Inhale into the lungs.     atorvastatin (LIPITOR) 40 MG tablet Take 40 mg by mouth daily.  enoxaparin (LOVENOX) 40 MG/0.4ML injection Inject into the skin.     furosemide (LASIX) 40 MG tablet Take 40 mg by mouth daily.     gabapentin (NEURONTIN) 600 MG tablet Take by mouth 2 (two) times daily.     LANTUS SOLOSTAR 100 UNIT/ML Solostar Pen Inject 6 Units into the skin at bedtime.     lidocaine-prilocaine (EMLA) cream Apply to affected  area once 30 g 3   metoprolol succinate (TOPROL-XL) 25 MG 24 hr tablet Take 1 tablet (25 mg total) by mouth daily. 30 tablet 0   ondansetron (ZOFRAN) 8 MG tablet Take 1 tablet (8 mg total) by mouth 2 (two) times daily as needed for refractory nausea / vomiting. 60 tablet 2   oxyCODONE (OXY IR/ROXICODONE) 5 MG immediate release tablet Take 1 tablet (5 mg total) by mouth every 4 (four) hours as needed for severe pain. 30 tablet 0   pantoprazole (PROTONIX) 40 MG tablet Take 1 tablet (40 mg total) by mouth daily. 30 tablet 5   prochlorperazine (COMPAZINE) 10 MG tablet Take 1 tablet (10 mg total) by mouth every 6 (six) hours as needed (Nausea or vomiting). 60 tablet 2   ranolazine (RANEXA) 500 MG 12 hr tablet Take 500 mg by mouth 2 (two) times daily.     venlafaxine XR (EFFEXOR-XR) 75 MG 24 hr capsule Take 75 mg by mouth daily.     No current facility-administered medications for this visit.    OBJECTIVE: There were no vitals filed for this visit.     There is no height or weight on file to calculate BMI.    ECOG FS:1 - Symptomatic but completely ambulatory  General: Well-developed, well-nourished, no acute distress. Eyes: Pink conjunctiva, anicteric sclera. HEENT: Normocephalic, moist mucous membranes. Lungs: No audible wheezing or coughing. Heart: Regular rate and rhythm. Abdomen: Soft, nontender, no obvious distention.  Large, well-healed midline scar. Musculoskeletal: No edema, cyanosis, or clubbing. Neuro: Alert, answering all questions appropriately. Cranial nerves grossly intact. Skin: No rashes or petechiae noted. Psych: Normal affect.  LAB RESULTS:  Lab Results  Component Value Date   NA 142 02/05/2022   K 3.5 02/05/2022   CL 100 02/05/2022   CO2 32 02/05/2022   GLUCOSE 137 (H) 02/05/2022   BUN 16 02/05/2022   CREATININE 0.65 02/05/2022   CALCIUM 8.9 02/05/2022   PROT 5.9 (L) 02/04/2022   ALBUMIN 2.9 (L) 02/04/2022   AST 14 (L) 02/04/2022   ALT 12 02/04/2022   ALKPHOS  46 02/04/2022   BILITOT 0.5 02/04/2022   GFRNONAA >60 02/05/2022    Lab Results  Component Value Date   WBC 12.0 (H) 03/07/2022   NEUTROABS 7.0 02/12/2022   HGB 11.1 (L) 03/07/2022   HCT 36.9 03/07/2022   MCV 78.5 (L) 03/07/2022   PLT 262 03/07/2022     STUDIES: IR IMAGING GUIDED PORT INSERTION  Result Date: 06/18/2022 INDICATION: History of lung and colon cancer. In need of durable intravenous access for chemotherapy administration. EXAM: IMPLANTED PORT A CATH PLACEMENT WITH ULTRASOUND AND FLUOROSCOPIC GUIDANCE COMPARISON:  PET-CT-02/27/2022 MEDICATIONS: None ANESTHESIA/SEDATION: Moderate (conscious) sedation was employed during this procedure as administered by the Interventional Radiology RN. A total of Fentanyl 50 mcg was administered intravenously. Moderate Sedation Time: 24 minutes. The patient's level of consciousness and vital signs were monitored continuously by radiology nursing throughout the procedure under my direct supervision. CONTRAST:  None FLUOROSCOPY TIME:  12 seconds (1.1 mGy) COMPLICATIONS: None immediate. PROCEDURE: The procedure, risks, benefits, and alternatives were explained  to the patient. Questions regarding the procedure were encouraged and answered. The patient understands and consents to the procedure. The right neck and chest were prepped with chlorhexidine in a sterile fashion, and a sterile drape was applied covering the operative field. Maximum barrier sterile technique with sterile gowns and gloves were used for the procedure. A timeout was performed prior to the initiation of the procedure. Local anesthesia was provided with 1% lidocaine with epinephrine. After creating a small venotomy incision, a micropuncture kit was utilized to access the internal jugular vein. Real-time ultrasound guidance was utilized for vascular access including the acquisition of a permanent ultrasound image documenting patency of the accessed vessel. The microwire was utilized to  measure appropriate catheter length. A subcutaneous port pocket was then created along the upper chest wall utilizing a combination of sharp and blunt dissection. The pocket was irrigated with sterile saline. A single lumen Slim sized power injectable port was chosen for placement. The 8 Fr catheter was tunneled from the port pocket site to the venotomy incision. The port was placed in the pocket. The external catheter was trimmed to appropriate length. At the venotomy, an 8 Fr peel-away sheath was placed over a guidewire under fluoroscopic guidance. The catheter was then placed through the sheath and the sheath was removed. Final catheter positioning was confirmed and documented with a fluoroscopic spot radiograph. The port was accessed with a Huber needle, aspirated and flushed with heparinized saline. The venotomy site was closed with an interrupted 4-0 Vicryl suture. The port pocket incision was closed with interrupted 2-0 Vicryl suture. Dermabond and Steri-strips were applied to both incisions. Dressings were applied. The patient tolerated the procedure well without immediate post procedural complication. FINDINGS: After catheter placement, the tip lies within the superior cavoatrial junction. The catheter aspirates and flushes normally and is ready for immediate use. IMPRESSION: Successful placement of a right internal jugular approach power injectable Port-A-Cath. The catheter is ready for immediate use. Electronically Signed   By: [name] M.D.   On: 06/18/2022 16:02    ASSESSMENT: Stage IIIc colon cancer.  PLAN:    Stage IIIc colon cancer: Patient underwent complete surgical resection at [facility] on May 23, 2022.  Large colon mass was noted to partially invade stomach as well as surrounding mesentery.  PET scan did not reveal any metastatic disease other than a positive lung nodule which was confirmed to be a second primary.  Patient's preop CEA was 171.  Patient will require adjuvant  chemotherapy using FOLFOX every 2 weeks x 12.  She will also require port placement prior to initiating treatment.  Return to clinic in 2 weeks for further evaluation and consideration of cycle 1.   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  She has now completed XRT to this lesion.  Continue monitoring with CT scan every 6 months.   Pain: Essentially resolved.  Patient only uses oxycodone sparingly.  I spent a total of 30 minutes reviewing chart data, face-to-face evaluation with the patient, counseling and coordination of care as detailed above.   Patient expressed understanding and was in agreement with this plan. She also understands that She can call clinic at any time with any questions, concerns, or complaints.    Cancer Staging  Colon cancer [facility]) Staging form: Colon and Rectum, AJCC 8th Edition - Pathologic stage from 06/10/2022: Stage IIIC (pT4b, pN1c, cM0) - Signed by Lloyd Huger,  MD on 06/10/2022 Stage prefix: Initial diagnosis Total positive nodes: 0 Histologic grading system: 4 grade system Histologic grade (G): G3   Lloyd Huger, MD   06/23/2022 2:42 PM

## 2022-06-24 ENCOUNTER — Inpatient Hospital Stay (HOSPITAL_BASED_OUTPATIENT_CLINIC_OR_DEPARTMENT_OTHER): Payer: Medicare Other | Admitting: Hospice and Palliative Medicine

## 2022-06-24 ENCOUNTER — Inpatient Hospital Stay (HOSPITAL_BASED_OUTPATIENT_CLINIC_OR_DEPARTMENT_OTHER): Payer: Medicare Other | Admitting: Oncology

## 2022-06-24 ENCOUNTER — Encounter: Payer: Self-pay | Admitting: Oncology

## 2022-06-24 ENCOUNTER — Inpatient Hospital Stay: Payer: Medicare Other

## 2022-06-24 VITALS — BP 124/58 | HR 76 | Temp 97.2°F | Resp 16 | Ht 64.0 in | Wt 139.7 lb

## 2022-06-24 DIAGNOSIS — C184 Malignant neoplasm of transverse colon: Secondary | ICD-10-CM

## 2022-06-24 DIAGNOSIS — Z5111 Encounter for antineoplastic chemotherapy: Secondary | ICD-10-CM | POA: Diagnosis not present

## 2022-06-24 LAB — COMPREHENSIVE METABOLIC PANEL WITH GFR
ALT: 14 U/L (ref 0–44)
AST: 16 U/L (ref 15–41)
Albumin: 3.9 g/dL (ref 3.5–5.0)
Alkaline Phosphatase: 51 U/L (ref 38–126)
Anion gap: 8 (ref 5–15)
BUN: 29 mg/dL — ABNORMAL HIGH (ref 8–23)
CO2: 27 mmol/L (ref 22–32)
Calcium: 9.1 mg/dL (ref 8.9–10.3)
Chloride: 103 mmol/L (ref 98–111)
Creatinine, Ser: 0.7 mg/dL (ref 0.44–1.00)
GFR, Estimated: >60 mL/min
Glucose, Bld: 197 mg/dL — ABNORMAL HIGH (ref 70–99)
Potassium: 4 mmol/L (ref 3.5–5.1)
Sodium: 138 mmol/L (ref 135–145)
Total Bilirubin: 0.8 mg/dL (ref 0.3–1.2)
Total Protein: 7.3 g/dL (ref 6.5–8.1)

## 2022-06-24 LAB — CBC WITH DIFFERENTIAL/PLATELET
Abs Immature Granulocytes: 0.02 K/uL (ref 0.00–0.07)
Basophils Absolute: 0.1 K/uL (ref 0.0–0.1)
Basophils Relative: 1 %
Eosinophils Absolute: 0.3 K/uL (ref 0.0–0.5)
Eosinophils Relative: 4 %
HCT: 36 % (ref 36.0–46.0)
Hemoglobin: 11.3 g/dL — ABNORMAL LOW (ref 12.0–15.0)
Immature Granulocytes: 0 %
Lymphocytes Relative: 15 %
Lymphs Abs: 1.1 K/uL (ref 0.7–4.0)
MCH: 28 pg (ref 26.0–34.0)
MCHC: 31.4 g/dL (ref 30.0–36.0)
MCV: 89.3 fL (ref 80.0–100.0)
Monocytes Absolute: 0.7 K/uL (ref 0.1–1.0)
Monocytes Relative: 9 %
Neutro Abs: 5.1 K/uL (ref 1.7–7.7)
Neutrophils Relative %: 71 %
Platelets: 208 K/uL (ref 150–400)
RBC: 4.03 MIL/uL (ref 3.87–5.11)
RDW: 17.2 % — ABNORMAL HIGH (ref 11.5–15.5)
WBC: 7.2 K/uL (ref 4.0–10.5)
nRBC: 0 % (ref 0.0–0.2)

## 2022-06-24 MED ORDER — HEPARIN SOD (PORK) LOCK FLUSH 100 UNIT/ML IV SOLN
500.0000 [IU] | Freq: Once | INTRAVENOUS | Status: DC | PRN
  Filled 2022-06-24: qty 5

## 2022-06-24 MED ORDER — LEUCOVORIN CALCIUM INJECTION 350 MG
650.0000 mg | Freq: Once | INTRAVENOUS | Status: AC
  Administered 2022-06-24: 650 mg via INTRAVENOUS
  Filled 2022-06-24: qty 32.5

## 2022-06-24 MED ORDER — PALONOSETRON HCL INJECTION 0.25 MG/5ML
0.2500 mg | Freq: Once | INTRAVENOUS | Status: AC
  Administered 2022-06-24: 0.25 mg via INTRAVENOUS
  Filled 2022-06-24: qty 5

## 2022-06-24 MED ORDER — OXALIPLATIN CHEMO INJECTION 100 MG/20ML
85.0000 mg/m2 | Freq: Once | INTRAVENOUS | Status: AC
  Administered 2022-06-24: 145 mg via INTRAVENOUS
  Filled 2022-06-24: qty 20

## 2022-06-24 MED ORDER — DEXTROSE 5 % IV SOLN
Freq: Once | INTRAVENOUS | Status: AC
  Filled 2022-06-24: qty 250

## 2022-06-24 MED ORDER — SODIUM CHLORIDE 0.9 % IV SOLN
10.0000 mg | Freq: Once | INTRAVENOUS | Status: AC
  Administered 2022-06-24: 10 mg via INTRAVENOUS
  Filled 2022-06-24: qty 1

## 2022-06-24 MED ORDER — SODIUM CHLORIDE 0.9 % IV SOLN
2400.0000 mg/m2 | INTRAVENOUS | Status: DC
  Administered 2022-06-24: 4050 mg via INTRAVENOUS
  Filled 2022-06-24: qty 81

## 2022-06-24 MED ORDER — FLUOROURACIL CHEMO INJECTION 2.5 GM/50ML
400.0000 mg/m2 | Freq: Once | INTRAVENOUS | Status: AC
  Administered 2022-06-24: 650 mg via INTRAVENOUS
  Filled 2022-06-24: qty 13

## 2022-06-24 NOTE — Patient Instructions (Signed)
[facility] AT [place]  Discharge Instructions: Thank you for choosing [facility] to provide your oncology and hematology care.  If you have a lab appointment with the [facility], please go directly to the [facility] and check in at the registration area.  Wear comfortable clothing and clothing appropriate for easy access to any Portacath or PICC line.   We strive to give you quality time with your provider. You may need to reschedule your appointment if you arrive late (15 or more minutes).  Arriving late affects you and other patients whose appointments are after yours.  Also, if you miss three or more appointments without notifying the office, you may be dismissed from the clinic at the provider's discretion.      For prescription refill requests, have your pharmacy contact our office and allow 72 hours for refills to be completed.    Today you received the following chemotherapy and/or immunotherapy agents OXALIPLATIN, LEUCOVORIN, 5FU      To help prevent nausea and vomiting after your treatment, we encourage you to take your nausea medication as directed.  BELOW ARE SYMPTOMS THAT SHOULD BE REPORTED IMMEDIATELY: *FEVER GREATER THAN 100.4 F (38 C) OR HIGHER *CHILLS OR SWEATING *NAUSEA AND VOMITING THAT IS NOT CONTROLLED WITH YOUR NAUSEA MEDICATION *UNUSUAL SHORTNESS OF BREATH *UNUSUAL BRUISING OR BLEEDING *URINARY PROBLEMS (pain or burning when urinating, or frequent urination) *BOWEL PROBLEMS (unusual diarrhea, constipation, pain near the anus) TENDERNESS IN MOUTH AND THROAT WITH OR WITHOUT PRESENCE OF ULCERS (sore throat, sores in mouth, or a toothache) UNUSUAL RASH, SWELLING OR PAIN  UNUSUAL VAGINAL DISCHARGE OR ITCHING   Items with * indicate a potential emergency and should be followed up as soon as possible or go to the Emergency Department if any problems should occur.  Please show the CHEMOTHERAPY ALERT CARD or IMMUNOTHERAPY ALERT  CARD at check-in to the Emergency Department and triage nurse.  Should you have questions after your visit or need to cancel or reschedule your appointment, please contact [facility] [place] [facility] [place]  [phone] and follow the prompts.  Office hours are 8:00 a.m. to 4:30 p.m. Monday - Friday. Please note that voicemails left after 4:00 p.m. may not be returned until the following business day.  We are closed weekends and major holidays. You have access to a nurse at all times for urgent questions. Please call the main number to the clinic [phone] and follow the prompts.  For any non-urgent questions, you may also contact your provider using MyChart. We now offer e-Visits for anyone 89 and older to request care online for non-urgent symptoms. For details visit [email or web address].   Also download the MyChart app! Go to the app store, search "MyChart", open the app, select [facility], and log in with your MyChart username and password.  Masks are optional in the cancer centers. If you would like for your care team to wear a mask while they are taking care of you, please let them know. For doctor visits, patients may have with them one support person who is at least 73 years old. At this time, visitors are not allowed in the infusion area.  Oxaliplatin Injection What is this medication? OXALIPLATIN (ox AL i PLA tin) is a chemotherapy drug. It targets fast dividing cells, like cancer cells, and causes these cells to die. This medicine is used to treat cancers of the colon and rectum, and many other cancers. This medicine may be used for other purposes;  ask your health care provider or pharmacist if you have questions. COMMON BRAND NAME(S): Eloxatin What should I tell my care team before I take this medication? They need to know if you have any of these conditions: heart disease history of irregular heartbeat liver disease low blood counts, like white cells, platelets, or  red blood cells lung or breathing disease, like asthma take medicines that treat or prevent blood clots tingling of the fingers or toes, or other nerve disorder an unusual or allergic reaction to oxaliplatin, other chemotherapy, other medicines, foods, dyes, or preservatives pregnant or trying to get pregnant breast-feeding How should I use this medication? This drug is given as an infusion into a vein. It is administered in a hospital or clinic by a specially trained health care professional. Talk to your pediatrician regarding the use of this medicine in children. Special care may be needed. Overdosage: If you think you have taken too much of this medicine contact a poison control center or emergency room at once. NOTE: This medicine is only for you. Do not share this medicine with others. What if I miss a dose? It is important not to miss a dose. Call your doctor or health care professional if you are unable to keep an appointment. What may interact with this medication? Do not take this medicine with any of the following medications: cisapride dronedarone pimozide thioridazine This medicine may also interact with the following medications: aspirin and aspirin-like medicines certain medicines that treat or prevent blood clots like warfarin, apixaban, dabigatran, and rivaroxaban cisplatin cyclosporine diuretics medicines for infection like acyclovir, adefovir, amphotericin B, bacitracin, cidofovir, foscarnet, ganciclovir, gentamicin, pentamidine, vancomycin NSAIDs, medicines for pain and inflammation, like ibuprofen or naproxen other medicines that prolong the QT interval (an abnormal heart rhythm) pamidronate zoledronic acid This list may not describe all possible interactions. Give your health care provider a list of all the medicines, herbs, non-prescription drugs, or dietary supplements you use. Also tell them if you smoke, drink alcohol, or use illegal drugs. Some items may  interact with your medicine. What should I watch for while using this medication? Your condition will be monitored carefully while you are receiving this medicine. You may need blood work done while you are taking this medicine. This medicine may make you feel generally unwell. This is not uncommon as chemotherapy can affect healthy cells as well as cancer cells. Report any side effects. Continue your course of treatment even though you feel ill unless your healthcare professional tells you to stop. This medicine can make you more sensitive to cold. Do not drink cold drinks or use ice. Cover exposed skin before coming in contact with cold temperatures or cold objects. When out in cold weather wear warm clothing and cover your mouth and nose to warm the air that goes into your lungs. Tell your doctor if you get sensitive to the cold. Do not become pregnant while taking this medicine or for 9 months after stopping it. Women should inform their health care professional if they wish to become pregnant or think they might be pregnant. Men should not father a child while taking this medicine and for 6 months after stopping it. There is potential for serious side effects to an unborn child. Talk to your health care professional for more information. Do not breast-feed a child while taking this medicine or for 3 months after stopping it. This medicine has caused ovarian failure in some women. This medicine may make it more difficult to get  pregnant. Talk to your health care professional if you are concerned about your fertility. This medicine has caused decreased sperm counts in some men. This may make it more difficult to father a child. Talk to your health care professional if you are concerned about your fertility. This medicine may increase your risk of getting an infection. Call your health care professional for advice if you get a fever, chills, or sore throat, or other symptoms of a cold or flu. Do not  treat yourself. Try to avoid being around people who are sick. Avoid taking medicines that contain aspirin, acetaminophen, ibuprofen, naproxen, or ketoprofen unless instructed by your health care professional. These medicines may hide a fever. Be careful brushing or flossing your teeth or using a toothpick because you may get an infection or bleed more easily. If you have any dental work done, tell your dentist you are receiving this medicine. What side effects may I notice from receiving this medication? Side effects that you should report to your doctor or health care professional as soon as possible: allergic reactions like skin rash, itching or hives, swelling of the face, lips, or tongue breathing problems cough low blood counts - this medicine may decrease the number of white blood cells, red blood cells, and platelets. You may be at increased risk for infections and bleeding nausea, vomiting pain, redness, or irritation at site where injected pain, tingling, numbness in the hands or feet signs and symptoms of bleeding such as bloody or black, tarry stools; red or dark brown urine; spitting up blood or brown material that looks like coffee grounds; red spots on the skin; unusual bruising or bleeding from the eyes, gums, or nose signs and symptoms of a dangerous change in heartbeat or heart rhythm like chest pain; dizziness; fast, irregular heartbeat; palpitations; feeling faint or lightheaded; falls signs and symptoms of infection like fever; chills; cough; sore throat; pain or trouble passing urine signs and symptoms of liver injury like dark yellow or brown urine; general ill feeling or flu-like symptoms; light-colored stools; loss of appetite; nausea; right upper belly pain; unusually weak or tired; yellowing of the eyes or skin signs and symptoms of low red blood cells or anemia such as unusually weak or tired; feeling faint or lightheaded; falls signs and symptoms of muscle injury like  dark urine; trouble passing urine or change in the amount of urine; unusually weak or tired; muscle pain; back pain Side effects that usually do not require medical attention (report to your doctor or health care professional if they continue or are bothersome): changes in taste diarrhea gas hair loss loss of appetite mouth sores This list may not describe all possible side effects. Call your doctor for medical advice about side effects. You may report side effects to FDA at 1-800-FDA-1088. Where should I keep my medication? This drug is given in a hospital or clinic and will not be stored at home. NOTE: This sheet is a summary. It may not cover all possible information. If you have questions about this medicine, talk to your doctor, pharmacist, or health care provider.  2023 Elsevier/Gold Standard (2021-10-18 00:00:00)  Leucovorin injection What is this medication? LEUCOVORIN (loo koe VOR in) is used to prevent or treat the harmful effects of some medicines. This medicine is used to treat anemia caused by a low amount of folic acid in the body. It is also used with 5-fluorouracil (5-FU) to treat colon cancer. This medicine may be used for other purposes; ask your  health care provider or pharmacist if you have questions. What should I tell my care team before I take this medication? They need to know if you have any of these conditions: anemia from low levels of vitamin B-12 in the blood an unusual or allergic reaction to leucovorin, folic acid, other medicines, foods, dyes, or preservatives pregnant or trying to get pregnant breast-feeding How should I use this medication? This medicine is for injection into a muscle or into a vein. It is given by a health care professional in a hospital or clinic setting. Talk to your pediatrician regarding the use of this medicine in children. Special care may be needed. Overdosage: If you think you have taken too much of this medicine contact a poison  control center or emergency room at once. NOTE: This medicine is only for you. Do not share this medicine with others. What if I miss a dose? This does not apply. What may interact with this medication? capecitabine fluorouracil phenobarbital phenytoin primidone trimethoprim-sulfamethoxazole This list may not describe all possible interactions. Give your health care provider a list of all the medicines, herbs, non-prescription drugs, or dietary supplements you use. Also tell them if you smoke, drink alcohol, or use illegal drugs. Some items may interact with your medicine. What should I watch for while using this medication? Your condition will be monitored carefully while you are receiving this medicine. This medicine may increase the side effects of 5-fluorouracil, 5-FU. Tell your doctor or health care professional if you have diarrhea or mouth sores that do not get better or that get worse. What side effects may I notice from receiving this medication? Side effects that you should report to your doctor or health care professional as soon as possible: allergic reactions like skin rash, itching or hives, swelling of the face, lips, or tongue breathing problems fever, infection mouth sores unusual bleeding or bruising unusually weak or tired Side effects that usually do not require medical attention (report to your doctor or health care professional if they continue or are bothersome): constipation or diarrhea loss of appetite nausea, vomiting This list may not describe all possible side effects. Call your doctor for medical advice about side effects. You may report side effects to FDA at 1-800-FDA-1088. Where should I keep my medication? This drug is given in a hospital or clinic and will not be stored at home. NOTE: This sheet is a summary. It may not cover all possible information. If you have questions about this medicine, talk to your doctor, pharmacist, or health care provider.   2023 Elsevier/Gold Standard (2008-05-25 00:00:00)  Fluorouracil, 5-FU injection What is this medication? FLUOROURACIL, 5-FU (flure oh YOOR a sil) is a chemotherapy drug. It slows the growth of cancer cells. This medicine is used to treat many types of cancer like breast cancer, colon or rectal cancer, pancreatic cancer, and stomach cancer. This medicine may be used for other purposes; ask your health care provider or pharmacist if you have questions. COMMON BRAND NAME(S): Adrucil What should I tell my care team before I take this medication? They need to know if you have any of these conditions: blood disorders dihydropyrimidine dehydrogenase (DPD) deficiency infection (especially a virus infection such as chickenpox, cold sores, or herpes) kidney disease liver disease malnourished, poor nutrition recent or ongoing radiation therapy an unusual or allergic reaction to fluorouracil, other chemotherapy, other medicines, foods, dyes, or preservatives pregnant or trying to get pregnant breast-feeding How should I use this medication? This drug is given  as an infusion or injection into a vein. It is administered in a hospital or clinic by a specially trained health care professional. Talk to your pediatrician regarding the use of this medicine in children. Special care may be needed. Overdosage: If you think you have taken too much of this medicine contact a poison control center or emergency room at once. NOTE: This medicine is only for you. Do not share this medicine with others. What if I miss a dose? It is important not to miss your dose. Call your doctor or health care professional if you are unable to keep an appointment. What may interact with this medication? Do not take this medicine with any of the following medications: live virus vaccines This medicine may also interact with the following medications: medicines that treat or prevent blood clots like warfarin, enoxaparin, and  dalteparin This list may not describe all possible interactions. Give your health care provider a list of all the medicines, herbs, non-prescription drugs, or dietary supplements you use. Also tell them if you smoke, drink alcohol, or use illegal drugs. Some items may interact with your medicine. What should I watch for while using this medication? Visit your doctor for checks on your progress. This drug may make you feel generally unwell. This is not uncommon, as chemotherapy can affect healthy cells as well as cancer cells. Report any side effects. Continue your course of treatment even though you feel ill unless your doctor tells you to stop. In some cases, you may be given additional medicines to help with side effects. Follow all directions for their use. Call your doctor or health care professional for advice if you get a fever, chills or sore throat, or other symptoms of a cold or flu. Do not treat yourself. This drug decreases your body's ability to fight infections. Try to avoid being around people who are sick. This medicine may increase your risk to bruise or bleed. Call your doctor or health care professional if you notice any unusual bleeding. Be careful brushing and flossing your teeth or using a toothpick because you may get an infection or bleed more easily. If you have any dental work done, tell your dentist you are receiving this medicine. Avoid taking products that contain aspirin, acetaminophen, ibuprofen, naproxen, or ketoprofen unless instructed by your doctor. These medicines may hide a fever. Do not become pregnant while taking this medicine. Women should inform their doctor if they wish to become pregnant or think they might be pregnant. There is a potential for serious side effects to an unborn child. Talk to your health care professional or pharmacist for more information. Do not breast-feed an infant while taking this medicine. Men should inform their doctor if they wish to  father a child. This medicine may lower sperm counts. Do not treat diarrhea with over the counter products. Contact your doctor if you have diarrhea that lasts more than 2 days or if it is severe and watery. This medicine can make you more sensitive to the sun. Keep out of the sun. If you cannot avoid being in the sun, wear protective clothing and use sunscreen. Do not use sun lamps or tanning beds/booths. What side effects may I notice from receiving this medication? Side effects that you should report to your doctor or health care professional as soon as possible: allergic reactions like skin rash, itching or hives, swelling of the face, lips, or tongue low blood counts - this medicine may decrease the number of white blood  cells, red blood cells and platelets. You may be at increased risk for infections and bleeding. signs of infection - fever or chills, cough, sore throat, pain or difficulty passing urine signs of decreased platelets or bleeding - bruising, pinpoint red spots on the skin, black, tarry stools, blood in the urine signs of decreased red blood cells - unusually weak or tired, fainting spells, lightheadedness breathing problems changes in vision chest pain mouth sores nausea and vomiting pain, swelling, redness at site where injected pain, tingling, numbness in the hands or feet redness, swelling, or sores on hands or feet stomach pain unusual bleeding Side effects that usually do not require medical attention (report to your doctor or health care professional if they continue or are bothersome): changes in finger or toe nails diarrhea dry or itchy skin hair loss headache loss of appetite sensitivity of eyes to the light stomach upset unusually teary eyes This list may not describe all possible side effects. Call your doctor for medical advice about side effects. You may report side effects to FDA at 1-800-FDA-1088. Where should I keep my medication? This drug is given  in a hospital or clinic and will not be stored at home. NOTE: This sheet is a summary. It may not cover all possible information. If you have questions about this medicine, talk to your doctor, pharmacist, or health care provider.  2023 Elsevier/Gold Standard (2021-10-18 00:00:00)

## 2022-06-24 NOTE — Progress Notes (Signed)
[place] at [facility] Telephone:(336) [phone] [phone]) [phone]   Name: Jessica Stout Date: 06/24/2022 MRN: [id number]  DOB: May 23, 1951  Patient Care Team: Albina Billet, MD as PCP - General (Internal Medicine) Clent Jacks, RN as Oncology Nurse Navigator    REASON FOR CONSULTATION: Jessica Stout is a 71 y.o. 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  Palliative care was consulted to address goals.  SOCIAL HISTORY:     reports that she has been smoking e-cigarettes. She does not have any smokeless tobacco history on file. She reports current alcohol use.  Patient is married and lives at home with her husband and two cats.  She has a son who lives nearby and is involved.  Patient previously worked as a Regulatory affairs officer.  ADVANCE DIRECTIVES:  None on file  CODE STATUS:   PAST MEDICAL HISTORY: Past Medical History:  Diagnosis Date   Anemia    Anxiety    Asthma    Cancer ([place])    colon   CHF (congestive heart failure) (HCC)    COPD (chronic obstructive pulmonary disease) ([place])    Diabetes mellitus without complication ([place])    Hyperlipidemia    Hypertension    Stroke ([place])     PAST SURGICAL HISTORY:  Past Surgical History:  Procedure Laterality Date   COLONOSCOPY WITH PROPOFOL N/A 02/03/2022   Procedure: COLONOSCOPY WITH PROPOFOL;  Surgeon: Lin Landsman, MD;  Location: [facility] ENDOSCOPY;  Service: Gastroenterology;  Laterality: N/A;   ESOPHAGOGASTRODUODENOSCOPY (EGD) WITH PROPOFOL N/A 02/03/2022   Procedure: ESOPHAGOGASTRODUODENOSCOPY (EGD) WITH PROPOFOL;  Surgeon: Lin Landsman, MD;  Location: [facility] ENDOSCOPY;  Service: Gastroenterology;  Laterality:  N/A;   GIVENS CAPSULE STUDY N/A 02/03/2022   Procedure: GIVENS CAPSULE STUDY;  Surgeon: Lin Landsman, MD;  Location: [facility] ENDOSCOPY;  Service: Gastroenterology;  Laterality: N/A;  possible Capsule Study   IR IMAGING GUIDED PORT INSERTION  06/18/2022    HEMATOLOGY/ONCOLOGY HISTORY:  Oncology History  Colon cancer ([place])  02/04/2022 Initial Diagnosis   Colon cancer ([place])   06/10/2022 Cancer Staging   Staging form: Colon and Rectum, AJCC 8th Edition - Pathologic stage from 06/10/2022: Stage IIIC (pT4b, pN1c, cM0) - Signed by Lloyd Huger, MD on 06/10/2022 Stage prefix: Initial diagnosis Total positive nodes: 0 Histologic grading system: 4 grade system Histologic grade (G): G3   06/24/2022 -  Chemotherapy   Patient is on Treatment Plan : COLORECTAL FOLFOX q14d x 6 months       ALLERGIES:  is allergic to codeine.  MEDICATIONS:  Current Outpatient Medications  Medication Sig Dispense Refill   acetaminophen (TYLENOL) 325 MG tablet Take 650 mg by mouth every 6 (six) hours as needed.     albuterol (VENTOLIN HFA) 108 (90 Base) MCG/ACT inhaler Inhale into the lungs.     atorvastatin (LIPITOR) 40 MG tablet Take 40 mg by mouth daily.     enoxaparin (LOVENOX) 40 MG/0.4ML injection Inject into the skin. (Patient not taking: Reported on 06/24/2022)     furosemide (LASIX) 40 MG tablet Take 40 mg by mouth daily.     gabapentin (NEURONTIN) 600 MG tablet Take by mouth 2 (two) times daily.     LANTUS SOLOSTAR 100 UNIT/ML Solostar Pen Inject 6 Units into the  skin at bedtime.     lidocaine-prilocaine (EMLA) cream Apply to affected area once 30 g 3   metoprolol succinate (TOPROL-XL) 25 MG 24 hr tablet Take 1 tablet (25 mg total) by mouth daily. 30 tablet 0   ondansetron (ZOFRAN) 8 MG tablet Take 1 tablet (8 mg total) by mouth 2 (two) times daily as needed for refractory nausea / vomiting. 60 tablet 2   oxyCODONE (OXY IR/ROXICODONE) 5 MG immediate release tablet Take 1 tablet (5 mg total) by mouth  every 4 (four) hours as needed for severe pain. 30 tablet 0   pantoprazole (PROTONIX) 40 MG tablet Take 1 tablet (40 mg total) by mouth daily. 30 tablet 5   prochlorperazine (COMPAZINE) 10 MG tablet Take 1 tablet (10 mg total) by mouth every 6 (six) hours as needed (Nausea or vomiting). 60 tablet 2   ranolazine (RANEXA) 500 MG 12 hr tablet Take 500 mg by mouth 2 (two) times daily.     venlafaxine XR (EFFEXOR-XR) 75 MG 24 hr capsule Take 75 mg by mouth daily.     No current facility-administered medications for this visit.   Facility-Administered Medications Ordered in Other Visits  Medication Dose Route Frequency Provider Last Rate Last Admin   fluorouracil (ADRUCIL) 4,050 mg in sodium chloride 0.9 % 69 mL chemo infusion  2,400 mg/m2 (Treatment Plan Recorded) Intravenous 1 day or 1 dose Grayland Ormond, Kathlene November, MD       fluorouracil (ADRUCIL) chemo injection 650 mg  400 mg/m2 (Treatment Plan Recorded) Intravenous Once Lloyd Huger, MD       heparin lock flush 100 unit/mL  500 Units Intracatheter Once PRN Lloyd Huger, MD       leucovorin 650 mg in dextrose 5 % 250 mL infusion  650 mg Intravenous Once Lloyd Huger, MD       oxaliplatin (ELOXATIN) 145 mg in dextrose 5 % 500 mL chemo infusion  85 mg/m2 (Treatment Plan Recorded) Intravenous Once Lloyd Huger, MD       palonosetron (ALOXI) injection 0.25 mg  0.25 mg Intravenous Once Lloyd Huger, MD        VITAL SIGNS: There were no vitals taken for this visit. There were no vitals filed for this visit.  Estimated body mass index is 23.98 kg/m as calculated from the following:   Height as of an earlier encounter on 06/24/22: 5' 4"  (1.626 m).   Weight as of an earlier encounter on 06/24/22: 139 lb 11.2 oz (63.4 kg).  LABS: CBC:    Component Value Date/Time   WBC 7.2 06/24/2022 0859   HGB 11.3 (L) 06/24/2022 0859   HCT 36.0 06/24/2022 0859   PLT 208 06/24/2022 0859   MCV 89.3 06/24/2022 0859   NEUTROABS 5.1  06/24/2022 0859   LYMPHSABS 1.1 06/24/2022 0859   MONOABS 0.7 06/24/2022 0859   EOSABS 0.3 06/24/2022 0859   BASOSABS 0.1 06/24/2022 0859   Comprehensive Metabolic Panel:    Component Value Date/Time   NA 138 06/24/2022 0859   K 4.0 06/24/2022 0859   CL 103 06/24/2022 0859   CO2 27 06/24/2022 0859   BUN 29 (H) 06/24/2022 0859   CREATININE 0.70 06/24/2022 0859   GLUCOSE 197 (H) 06/24/2022 0859   CALCIUM 9.1 06/24/2022 0859   AST 16 06/24/2022 0859   ALT 14 06/24/2022 0859   ALKPHOS 51 06/24/2022 0859   BILITOT 0.8 06/24/2022 0859   PROT 7.3 06/24/2022 0859   ALBUMIN 3.9 06/24/2022 0859  RADIOGRAPHIC STUDIES: IR IMAGING GUIDED PORT INSERTION  Result Date: 06/18/2022 INDICATION: History of lung and colon cancer. In need of durable intravenous access for chemotherapy administration. EXAM: IMPLANTED PORT A CATH PLACEMENT WITH ULTRASOUND AND FLUOROSCOPIC GUIDANCE COMPARISON:  PET-CT-02/27/2022 MEDICATIONS: None ANESTHESIA/SEDATION: Moderate (conscious) sedation was employed during this procedure as administered by the Interventional Radiology RN. A total of Fentanyl 50 mcg was administered intravenously. Moderate Sedation Time: 24 minutes. The patient's level of consciousness and vital signs were monitored continuously by radiology nursing throughout the procedure under my direct supervision. CONTRAST:  None FLUOROSCOPY TIME:  12 seconds (1.1 mGy) COMPLICATIONS: None immediate. PROCEDURE: The procedure, risks, benefits, and alternatives were explained to the patient. Questions regarding the procedure were encouraged and answered. The patient understands and consents to the procedure. The right neck and chest were prepped with chlorhexidine in a sterile fashion, and a sterile drape was applied covering the operative field. Maximum barrier sterile technique with sterile gowns and gloves were used for the procedure. A timeout was performed prior to the initiation of the procedure. Local  anesthesia was provided with 1% lidocaine with epinephrine. After creating a small venotomy incision, a micropuncture kit was utilized to access the internal jugular vein. Real-time ultrasound guidance was utilized for vascular access including the acquisition of a permanent ultrasound image documenting patency of the accessed vessel. The microwire was utilized to measure appropriate catheter length. A subcutaneous port pocket was then created along the upper chest wall utilizing a combination of sharp and blunt dissection. The pocket was irrigated with sterile saline. A single lumen Slim sized power injectable port was chosen for placement. The 8 Fr catheter was tunneled from the port pocket site to the venotomy incision. The port was placed in the pocket. The external catheter was trimmed to appropriate length. At the venotomy, an 8 Fr peel-away sheath was placed over a guidewire under fluoroscopic guidance. The catheter was then placed through the sheath and the sheath was removed. Final catheter positioning was confirmed and documented with a fluoroscopic spot radiograph. The port was accessed with a Huber needle, aspirated and flushed with heparinized saline. The venotomy site was closed with an interrupted 4-0 Vicryl suture. The port pocket incision was closed with interrupted 2-0 Vicryl suture. Dermabond and Steri-strips were applied to both incisions. Dressings were applied. The patient tolerated the procedure well without immediate post procedural complication. FINDINGS: After catheter placement, the tip lies within the superior cavoatrial junction. The catheter aspirates and flushes normally and is ready for immediate use. IMPRESSION: Successful placement of a right internal jugular approach power injectable Port-A-Cath. The catheter is ready for immediate use. Electronically Signed   By: [name] M.D.   On: 06/18/2022 16:02    PERFORMANCE STATUS (ECOG) : 1 - Symptomatic but completely  ambulatory  Review of Systems Unless otherwise noted, a complete review of systems is negative.  Physical Exam General: NAD Pulmonary: Unlabored Extremities: no edema, no joint deformities Skin: no rashes Neurological: Grossly nonfocal  IMPRESSION: Routine follow-up visit.  Patient seen in infusion.  She underwent surgical resection of colon mass on May 23, 2022.  Patient now receiving adjuvant chemotherapy.  Patient reports that she is doing extremely well.  She denies any significant changes or concerns.  She has occasional bilateral side pain but attributes this to increasing levels of activity/exertion as it seems positional.  She denies chest pain or shortness of breath.  Appetite is good and patient is trying to drink Glucerna regularly.  PLAN: -  Continue current scope of treatment -ACP/MOST form previously reviewed -Follow-up as needed   Patient expressed understanding and was in agreement with this plan. She also understands that She can call the clinic at any time with any questions, concerns, or complaints.     Time Total: 15 minutes  Visit consisted of counseling and education dealing with the complex and emotionally intense issues of symptom management and palliative care in the setting of serious and potentially life-threatening illness.Greater than 50%  of this time was spent counseling and coordinating care related to the above assessment and plan.  Signed by: Altha Harm, PhD, NP-C

## 2022-06-25 ENCOUNTER — Ambulatory Visit: Payer: Medicare Other | Admitting: Oncology

## 2022-06-25 ENCOUNTER — Telehealth: Payer: Self-pay

## 2022-06-25 NOTE — Telephone Encounter (Signed)
Telephone call to patient for follow up after receiving first infusion.   Patient states infusion went great.  States eating good and drinking plenty of fluids.   Denies any nausea or vomiting.  Encouraged patient to call for any concerns or questions. 

## 2022-06-26 ENCOUNTER — Inpatient Hospital Stay: Payer: Medicare Other

## 2022-06-26 DIAGNOSIS — Z5111 Encounter for antineoplastic chemotherapy: Secondary | ICD-10-CM | POA: Diagnosis not present

## 2022-06-26 DIAGNOSIS — C184 Malignant neoplasm of transverse colon: Secondary | ICD-10-CM

## 2022-06-26 MED ORDER — SODIUM CHLORIDE 0.9% FLUSH
10.0000 mL | INTRAVENOUS | Status: DC | PRN
  Administered 2022-06-26: 10 mL
  Filled 2022-06-26: qty 10

## 2022-06-26 MED ORDER — HEPARIN SOD (PORK) LOCK FLUSH 100 UNIT/ML IV SOLN
500.0000 [IU] | Freq: Once | INTRAVENOUS | Status: AC | PRN
  Administered 2022-06-26: 500 [IU]
  Filled 2022-06-26: qty 5

## 2022-06-27 NOTE — Progress Notes (Deleted)
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  INTERVAL HISTORY: Patient returns to clinic today for further evaluation and consideration of cycle 1 of 12 of adjuvant FOLFOX.  She currently feels well and is fully recovered from her surgery. She has no neurologic complaints.  She denies any recent fevers.  She has a fair appetite, but denies weight loss.  She has no chest pain, shortness of breath, cough, or hemoptysis.  She denies any nausea, vomiting, constipation, or diarrhea.  She has no melena or hematochezia.  Patient offers no specific complaints today.  REVIEW OF SYSTEMS:   Review of Systems  Constitutional: Negative.  Negative for fever, malaise/fatigue and weight loss.  Respiratory: Negative.  Negative for cough, hemoptysis and shortness of breath.   Cardiovascular: Negative.  Negative for chest pain and leg swelling.  Gastrointestinal: Negative.  Negative for abdominal pain, blood in stool, constipation, diarrhea, melena, nausea and vomiting.  Genitourinary: Negative.  Negative for dysuria.  Musculoskeletal: Negative.  Negative for back pain.  Skin: Negative.  Negative for rash.  Neurological: Negative.  Negative for dizziness, focal weakness, weakness and headaches.  Psychiatric/Behavioral: Negative.  The patient is not nervous/anxious.     As per HPI. Otherwise, a complete review of systems is negative.  PAST MEDICAL HISTORY: Past Medical History:  Diagnosis Date   Anemia    Anxiety    Asthma    Cancer ([place])    colon   CHF (congestive heart failure) (HCC)    COPD (chronic obstructive pulmonary disease) ([place])    Diabetes mellitus without complication ([place])     Hyperlipidemia    Hypertension    Stroke ([place])     PAST SURGICAL HISTORY: Past Surgical History:  Procedure Laterality Date   COLONOSCOPY WITH PROPOFOL N/A 02/03/2022   Procedure: COLONOSCOPY WITH PROPOFOL;  Surgeon: Lin Landsman, MD;  Location: [facility] ENDOSCOPY;  Service: Gastroenterology;  Laterality: N/A;   ESOPHAGOGASTRODUODENOSCOPY (EGD) WITH PROPOFOL N/A 02/03/2022   Procedure: ESOPHAGOGASTRODUODENOSCOPY (EGD) WITH PROPOFOL;  Surgeon: Lin Landsman, MD;  Location: [facility] ENDOSCOPY;  Service: Gastroenterology;  Laterality: N/A;   GIVENS CAPSULE STUDY N/A 02/03/2022   Procedure: GIVENS CAPSULE STUDY;  Surgeon: Lin Landsman, MD;  Location: [facility] ENDOSCOPY;  Service: Gastroenterology;  Laterality: N/A;  possible Capsule Study   IR IMAGING GUIDED PORT INSERTION  06/18/2022    FAMILY HISTORY: No family history on file.  ADVANCED DIRECTIVES (Y/N):  N  HEALTH MAINTENANCE: Social History   Tobacco Use   Smoking status: Every Day    Types: E-cigarettes  Substance Use Topics   Alcohol use: Yes    Comment: rare     Colonoscopy:  PAP:  Bone density:  Lipid panel:  Allergies  Allergen Reactions   Codeine Rash    Happened in 1970s    Current Outpatient Medications  Medication Sig Dispense Refill   acetaminophen (TYLENOL) 325 MG tablet Take 650 mg by mouth every 6 (six) hours as needed.     albuterol (VENTOLIN HFA) 108 (90 Base) MCG/ACT inhaler Inhale into the lungs.     atorvastatin (LIPITOR) 40 MG tablet Take 40 mg by mouth daily.     enoxaparin (LOVENOX) 40 MG/0.4ML injection Inject into the skin. (  Patient not taking: Reported on 06/24/2022)     furosemide (LASIX) 40 MG tablet Take 40 mg by mouth daily.     gabapentin (NEURONTIN) 600 MG tablet Take by mouth 2 (two) times daily.     LANTUS SOLOSTAR 100 UNIT/ML Solostar Pen Inject 6 Units into the skin at bedtime.     lidocaine-prilocaine (EMLA) cream Apply to affected area once 30 g 3   metoprolol succinate  (TOPROL-XL) 25 MG 24 hr tablet Take 1 tablet (25 mg total) by mouth daily. 30 tablet 0   ondansetron (ZOFRAN) 8 MG tablet Take 1 tablet (8 mg total) by mouth 2 (two) times daily as needed for refractory nausea / vomiting. 60 tablet 2   oxyCODONE (OXY IR/ROXICODONE) 5 MG immediate release tablet Take 1 tablet (5 mg total) by mouth every 4 (four) hours as needed for severe pain. 30 tablet 0   pantoprazole (PROTONIX) 40 MG tablet Take 1 tablet (40 mg total) by mouth daily. 30 tablet 5   prochlorperazine (COMPAZINE) 10 MG tablet Take 1 tablet (10 mg total) by mouth every 6 (six) hours as needed (Nausea or vomiting). 60 tablet 2   ranolazine (RANEXA) 500 MG 12 hr tablet Take 500 mg by mouth 2 (two) times daily.     venlafaxine XR (EFFEXOR-XR) 75 MG 24 hr capsule Take 75 mg by mouth daily.     No current facility-administered medications for this visit.    OBJECTIVE: There were no vitals filed for this visit.     There is no height or weight on file to calculate BMI.    ECOG FS:1 - Symptomatic but completely ambulatory  General: Well-developed, well-nourished, no acute distress. Eyes: Pink conjunctiva, anicteric sclera. HEENT: Normocephalic, moist mucous membranes. Lungs: No audible wheezing or coughing. Heart: Regular rate and rhythm. Abdomen: Soft, nontender, no obvious distention.  Well-healed midline surgical scar. Musculoskeletal: No edema, cyanosis, or clubbing. Neuro: Alert, answering all questions appropriately. Cranial nerves grossly intact. Skin: No rashes or petechiae noted. Psych: Normal affect.  LAB RESULTS:  Lab Results  Component Value Date   NA 138 06/24/2022   K 4.0 06/24/2022   CL 103 06/24/2022   CO2 27 06/24/2022   GLUCOSE 197 (H) 06/24/2022   BUN 29 (H) 06/24/2022   CREATININE 0.70 06/24/2022   CALCIUM 9.1 06/24/2022   PROT 7.3 06/24/2022   ALBUMIN 3.9 06/24/2022   AST 16 06/24/2022   ALT 14 06/24/2022   ALKPHOS 51 06/24/2022   BILITOT 0.8 06/24/2022    GFRNONAA >60 06/24/2022    Lab Results  Component Value Date   WBC 7.2 06/24/2022   NEUTROABS 5.1 06/24/2022   HGB 11.3 (L) 06/24/2022   HCT 36.0 06/24/2022   MCV 89.3 06/24/2022   PLT 208 06/24/2022     STUDIES: IR IMAGING GUIDED PORT INSERTION  Result Date: 06/18/2022 INDICATION: History of lung and colon cancer. In need of durable intravenous access for chemotherapy administration. EXAM: IMPLANTED PORT A CATH PLACEMENT WITH ULTRASOUND AND FLUOROSCOPIC GUIDANCE COMPARISON:  PET-CT-02/27/2022 MEDICATIONS: None ANESTHESIA/SEDATION: Moderate (conscious) sedation was employed during this procedure as administered by the Interventional Radiology RN. A total of Fentanyl 50 mcg was administered intravenously. Moderate Sedation Time: 24 minutes. The patient's level of consciousness and vital signs were monitored continuously by radiology nursing throughout the procedure under my direct supervision. CONTRAST:  None FLUOROSCOPY TIME:  12 seconds (1.1 mGy) COMPLICATIONS: None immediate. PROCEDURE: The procedure, risks, benefits, and alternatives were explained to the patient. Questions regarding the procedure  were encouraged and answered. The patient understands and consents to the procedure. The right neck and chest were prepped with chlorhexidine in a sterile fashion, and a sterile drape was applied covering the operative field. Maximum barrier sterile technique with sterile gowns and gloves were used for the procedure. A timeout was performed prior to the initiation of the procedure. Local anesthesia was provided with 1% lidocaine with epinephrine. After creating a small venotomy incision, a micropuncture kit was utilized to access the internal jugular vein. Real-time ultrasound guidance was utilized for vascular access including the acquisition of a permanent ultrasound image documenting patency of the accessed vessel. The microwire was utilized to measure appropriate catheter length. A subcutaneous port  pocket was then created along the upper chest wall utilizing a combination of sharp and blunt dissection. The pocket was irrigated with sterile saline. A single lumen Slim sized power injectable port was chosen for placement. The 8 Fr catheter was tunneled from the port pocket site to the venotomy incision. The port was placed in the pocket. The external catheter was trimmed to appropriate length. At the venotomy, an 8 Fr peel-away sheath was placed over a guidewire under fluoroscopic guidance. The catheter was then placed through the sheath and the sheath was removed. Final catheter positioning was confirmed and documented with a fluoroscopic spot radiograph. The port was accessed with a Huber needle, aspirated and flushed with heparinized saline. The venotomy site was closed with an interrupted 4-0 Vicryl suture. The port pocket incision was closed with interrupted 2-0 Vicryl suture. Dermabond and Steri-strips were applied to both incisions. Dressings were applied. The patient tolerated the procedure well without immediate post procedural complication. FINDINGS: After catheter placement, the tip lies within the superior cavoatrial junction. The catheter aspirates and flushes normally and is ready for immediate use. IMPRESSION: Successful placement of a right internal jugular approach power injectable Port-A-Cath. The catheter is ready for immediate use. Electronically Signed   By: [name] M.D.   On: 06/18/2022 16:02    ASSESSMENT: Stage IIIc colon cancer.  PLAN:    Stage IIIc colon cancer: Patient underwent complete surgical resection at [facility] on May 23, 2022.  Large colon mass was noted to partially invade stomach as well as surrounding mesentery.  PET scan did not reveal any metastatic disease other than a positive lung nodule which was confirmed to be a second primary.  Patient's preop CEA was 171.  Patient will require adjuvant chemotherapy using FOLFOX every 2 weeks x 12.  Patient has now  had port placement.  Proceed with cycle 1 of FOLFOX today.  Return to clinic in 2 days for pump removal, in 1 week for laboratory work and evaluation, and then in 2 weeks for further evaluation and consideration of cycle 2.   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  Patient completed XRT in May 2023.  Continue monitoring with CT scan every 6 months.   Pain: Resolved.  I spent a total of 30 minutes reviewing chart data, face-to-face evaluation with the patient, counseling and coordination of care as detailed above.   Patient expressed understanding and was in agreement with this plan. She also understands that She can call clinic at any time with any questions, concerns, or complaints.    Cancer Staging  Colon cancer [facility]) Staging form: Colon and Rectum, AJCC 8th Edition - Pathologic stage from 06/10/2022: Stage IIIC (pT4b, pN1c, cM0) - Signed  by Lloyd Huger, MD on 06/10/2022 Stage prefix: Initial diagnosis Total positive nodes: 0 Histologic grading system: 4 grade system Histologic grade (G): G3   Lloyd Huger, MD   06/27/2022 4:55 PM

## 2022-07-01 ENCOUNTER — Inpatient Hospital Stay: Payer: Medicare Other

## 2022-07-01 ENCOUNTER — Inpatient Hospital Stay: Payer: Medicare Other | Admitting: Oncology

## 2022-07-01 DIAGNOSIS — C184 Malignant neoplasm of transverse colon: Secondary | ICD-10-CM

## 2022-07-03 NOTE — Progress Notes (Signed)
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  INTERVAL HISTORY: Patient returns to clinic today for further evaluation and consideration of cycle 2 of 12 of adjuvant FOLFOX.  She tolerated her first treatment well without significant side effects. She has no neurologic complaints.  She denies any recent fevers.  She has a fair appetite, but denies weight loss.  She has no chest pain, shortness of breath, cough, or hemoptysis.  She denies any nausea, vomiting, constipation, or diarrhea.  She has no melena or hematochezia.  Patient offers no specific complaints today.  REVIEW OF SYSTEMS:   Review of Systems  Constitutional: Negative.  Negative for fever, malaise/fatigue and weight loss.  Respiratory: Negative.  Negative for cough, hemoptysis and shortness of breath.   Cardiovascular: Negative.  Negative for chest pain and leg swelling.  Gastrointestinal: Negative.  Negative for abdominal pain, blood in stool, constipation, diarrhea, melena, nausea and vomiting.  Genitourinary: Negative.  Negative for dysuria.  Musculoskeletal: Negative.  Negative for back pain.  Skin: Negative.  Negative for rash.  Neurological: Negative.  Negative for dizziness, focal weakness, weakness and headaches.  Psychiatric/Behavioral: Negative.  The patient is not nervous/anxious.     As per HPI. Otherwise, a complete review of systems is negative.  PAST MEDICAL HISTORY: Past Medical History:  Diagnosis Date   Anemia    Anxiety    Asthma    Cancer ([place])    colon   CHF (congestive heart failure) (HCC)    COPD (chronic obstructive pulmonary disease) ([place])    Diabetes mellitus without complication ([place])     Hyperlipidemia    Hypertension    Stroke ([place])     PAST SURGICAL HISTORY: Past Surgical History:  Procedure Laterality Date   COLONOSCOPY WITH PROPOFOL N/A 02/03/2022   Procedure: COLONOSCOPY WITH PROPOFOL;  Surgeon: Lin Landsman, MD;  Location: [facility] ENDOSCOPY;  Service: Gastroenterology;  Laterality: N/A;   ESOPHAGOGASTRODUODENOSCOPY (EGD) WITH PROPOFOL N/A 02/03/2022   Procedure: ESOPHAGOGASTRODUODENOSCOPY (EGD) WITH PROPOFOL;  Surgeon: Lin Landsman, MD;  Location: [facility] ENDOSCOPY;  Service: Gastroenterology;  Laterality: N/A;   GIVENS CAPSULE STUDY N/A 02/03/2022   Procedure: GIVENS CAPSULE STUDY;  Surgeon: Lin Landsman, MD;  Location: [facility] ENDOSCOPY;  Service: Gastroenterology;  Laterality: N/A;  possible Capsule Study   IR IMAGING GUIDED PORT INSERTION  06/18/2022    FAMILY HISTORY: History reviewed. No pertinent family history.  ADVANCED DIRECTIVES (Y/N):  N  HEALTH MAINTENANCE: Social History   Tobacco Use   Smoking status: Every Day    Types: E-cigarettes  Substance Use Topics   Alcohol use: Yes    Comment: rare     Colonoscopy:  PAP:  Bone density:  Lipid panel:  Allergies  Allergen Reactions   Codeine Rash    Happened in 1970s    Current Outpatient Medications  Medication Sig Dispense Refill   acetaminophen (TYLENOL) 325 MG tablet Take 650 mg by mouth every 6 (six) hours as needed.     albuterol (VENTOLIN HFA) 108 (90 Base) MCG/ACT inhaler Inhale into the lungs.     atorvastatin (LIPITOR) 40 MG tablet Take 40 mg by mouth daily.     furosemide (LASIX) 40 MG tablet Take 40 mg by  mouth daily.     gabapentin (NEURONTIN) 600 MG tablet Take by mouth 2 (two) times daily.     LANTUS SOLOSTAR 100 UNIT/ML Solostar Pen Inject 6 Units into the skin at bedtime.     metoprolol succinate (TOPROL-XL) 25 MG 24 hr tablet Take 1 tablet (25 mg total) by mouth daily. 30 tablet 0   ondansetron (ZOFRAN) 8 MG tablet Take 1 tablet (8 mg total) by mouth 2 (two)  times daily as needed for refractory nausea / vomiting. 60 tablet 2   oxyCODONE (OXY IR/ROXICODONE) 5 MG immediate release tablet Take 1 tablet (5 mg total) by mouth every 4 (four) hours as needed for severe pain. 30 tablet 0   pantoprazole (PROTONIX) 40 MG tablet Take 1 tablet (40 mg total) by mouth daily. 30 tablet 5   ranolazine (RANEXA) 500 MG 12 hr tablet Take 500 mg by mouth 2 (two) times daily.     venlafaxine XR (EFFEXOR-XR) 75 MG 24 hr capsule Take 75 mg by mouth daily.     enoxaparin (LOVENOX) 40 MG/0.4ML injection Inject into the skin. (Patient not taking: Reported on 06/24/2022)     lidocaine-prilocaine (EMLA) cream Apply to affected area once 30 g 3   prochlorperazine (COMPAZINE) 10 MG tablet Take 1 tablet (10 mg total) by mouth every 6 (six) hours as needed (Nausea or vomiting). (Patient not taking: Reported on 07/08/2022) 60 tablet 2   No current facility-administered medications for this visit.   Facility-Administered Medications Ordered in Other Visits  Medication Dose Route Frequency Provider Last Rate Last Admin   fluorouracil (ADRUCIL) 4,050 mg in sodium chloride 0.9 % 69 mL chemo infusion  2,400 mg/m2 (Treatment Plan Recorded) Intravenous 1 day or 1 dose Lloyd Huger, MD   Infusion Verify at 07/08/22 1339    OBJECTIVE: Vitals:   07/08/22 0913  BP: (!) 118/50  Pulse: 82  Resp: 18  Temp: 98.3 F (36.8 C)  SpO2: 94%      Body mass index is 24.55 kg/m.    ECOG FS:1 - Symptomatic but completely ambulatory  General: Well-developed, well-nourished, no acute distress. Eyes: Pink conjunctiva, anicteric sclera. HEENT: Normocephalic, moist mucous membranes. Lungs: No audible wheezing or coughing. Heart: Regular rate and rhythm. Abdomen: Soft, nontender, no obvious distention. Musculoskeletal: No edema, cyanosis, or clubbing. Neuro: Alert, answering all questions appropriately. Cranial nerves grossly intact. Skin: No rashes or petechiae noted. Psych: Normal  affect.  LAB RESULTS:  Lab Results  Component Value Date   NA 139 07/08/2022   K 4.1 07/08/2022   CL 102 07/08/2022   CO2 28 07/08/2022   GLUCOSE 267 (H) 07/08/2022   BUN 19 07/08/2022   CREATININE 0.74 07/08/2022   CALCIUM 8.9 07/08/2022   PROT 6.4 (L) 07/08/2022   ALBUMIN 3.2 (L) 07/08/2022   AST 16 07/08/2022   ALT 11 07/08/2022   ALKPHOS 57 07/08/2022   BILITOT 0.4 07/08/2022   GFRNONAA >60 07/08/2022    Lab Results  Component Value Date   WBC 5.3 07/08/2022   NEUTROABS 3.8 07/08/2022   HGB 9.4 (L) 07/08/2022   HCT 29.2 (L) 07/08/2022   MCV 88.0 07/08/2022   PLT 139 (L) 07/08/2022     STUDIES: IR IMAGING GUIDED PORT INSERTION  Result Date: 06/18/2022 INDICATION: History of lung and colon cancer. In need of durable intravenous access for chemotherapy administration. EXAM: IMPLANTED PORT A CATH PLACEMENT WITH ULTRASOUND AND FLUOROSCOPIC GUIDANCE COMPARISON:  PET-CT-02/27/2022 MEDICATIONS: None ANESTHESIA/SEDATION: Moderate (conscious) sedation was employed during this  procedure as administered by the Interventional Radiology RN. A total of Fentanyl 50 mcg was administered intravenously. Moderate Sedation Time: 24 minutes. The patient's level of consciousness and vital signs were monitored continuously by radiology nursing throughout the procedure under my direct supervision. CONTRAST:  None FLUOROSCOPY TIME:  12 seconds (1.1 mGy) COMPLICATIONS: None immediate. PROCEDURE: The procedure, risks, benefits, and alternatives were explained to the patient. Questions regarding the procedure were encouraged and answered. The patient understands and consents to the procedure. The right neck and chest were prepped with chlorhexidine in a sterile fashion, and a sterile drape was applied covering the operative field. Maximum barrier sterile technique with sterile gowns and gloves were used for the procedure. A timeout was performed prior to the initiation of the procedure. Local anesthesia  was provided with 1% lidocaine with epinephrine. After creating a small venotomy incision, a micropuncture kit was utilized to access the internal jugular vein. Real-time ultrasound guidance was utilized for vascular access including the acquisition of a permanent ultrasound image documenting patency of the accessed vessel. The microwire was utilized to measure appropriate catheter length. A subcutaneous port pocket was then created along the upper chest wall utilizing a combination of sharp and blunt dissection. The pocket was irrigated with sterile saline. A single lumen Slim sized power injectable port was chosen for placement. The 8 Fr catheter was tunneled from the port pocket site to the venotomy incision. The port was placed in the pocket. The external catheter was trimmed to appropriate length. At the venotomy, an 8 Fr peel-away sheath was placed over a guidewire under fluoroscopic guidance. The catheter was then placed through the sheath and the sheath was removed. Final catheter positioning was confirmed and documented with a fluoroscopic spot radiograph. The port was accessed with a Huber needle, aspirated and flushed with heparinized saline. The venotomy site was closed with an interrupted 4-0 Vicryl suture. The port pocket incision was closed with interrupted 2-0 Vicryl suture. Dermabond and Steri-strips were applied to both incisions. Dressings were applied. The patient tolerated the procedure well without immediate post procedural complication. FINDINGS: After catheter placement, the tip lies within the superior cavoatrial junction. The catheter aspirates and flushes normally and is ready for immediate use. IMPRESSION: Successful placement of a right internal jugular approach power injectable Port-A-Cath. The catheter is ready for immediate use. Electronically Signed   By: [name] M.D.   On: 06/18/2022 16:02    ASSESSMENT: Stage IIIc colon cancer.  PLAN:    Stage IIIc colon cancer: Patient  underwent complete surgical resection at [facility] on May 23, 2022.  Large colon mass was noted to partially invade stomach as well as surrounding mesentery.  PET scan did not reveal any metastatic disease other than a positive lung nodule which was confirmed to be a second primary.  Patient's preop CEA was 171.  Patient will require adjuvant chemotherapy using FOLFOX every 2 weeks x 12.  Patient has now had port placement.  Proceed with cycle 2 of FOLFOX today.  Return to clinic in 2 days for pump removal and then in 2 weeks for further evaluation and consideration of cycle 3.      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  Patient completed XRT in May 2023.  Continue monitoring with CT scan every 6 months.   Pain: Resolved. Anemia: Patient's hemoglobin is trended down to 9.4, monitor. Thrombocytopenia: Mild, proceed with treatment as  above. Hyperglycemia: Patient has persistently elevated blood glucose levels, monitor.   Patient expressed understanding and was in agreement with this plan. She also understands that She can call clinic at any time with any questions, concerns, or complaints.    Cancer Staging  Colon cancer [facility]) Staging form: Colon and Rectum, AJCC 8th Edition - Pathologic stage from 06/10/2022: Stage IIIC (pT4b, pN1c, cM0) - Signed by Lloyd Huger, MD on 06/10/2022 Stage prefix: Initial diagnosis Total positive nodes: 0 Histologic grading system: 4 grade system Histologic grade (G): G3   Lloyd Huger, MD   07/08/2022 2:39 PM

## 2022-07-07 MED FILL — Dexamethasone Sodium Phosphate Inj 100 MG/10ML: INTRAMUSCULAR | Qty: 1 | Status: AC

## 2022-07-08 ENCOUNTER — Inpatient Hospital Stay: Payer: Medicare Other

## 2022-07-08 ENCOUNTER — Encounter: Payer: Self-pay | Admitting: Oncology

## 2022-07-08 ENCOUNTER — Inpatient Hospital Stay (HOSPITAL_BASED_OUTPATIENT_CLINIC_OR_DEPARTMENT_OTHER): Payer: Medicare Other | Admitting: Oncology

## 2022-07-08 ENCOUNTER — Inpatient Hospital Stay: Payer: Medicare Other | Attending: Oncology

## 2022-07-08 VITALS — BP 118/50 | HR 82 | Temp 98.3°F | Resp 18 | Wt 143.0 lb

## 2022-07-08 DIAGNOSIS — C189 Malignant neoplasm of colon, unspecified: Secondary | ICD-10-CM | POA: Insufficient documentation

## 2022-07-08 DIAGNOSIS — Z923 Personal history of irradiation: Secondary | ICD-10-CM | POA: Diagnosis not present

## 2022-07-08 DIAGNOSIS — C184 Malignant neoplasm of transverse colon: Secondary | ICD-10-CM

## 2022-07-08 DIAGNOSIS — E1165 Type 2 diabetes mellitus with hyperglycemia: Secondary | ICD-10-CM | POA: Diagnosis not present

## 2022-07-08 DIAGNOSIS — D649 Anemia, unspecified: Secondary | ICD-10-CM | POA: Insufficient documentation

## 2022-07-08 DIAGNOSIS — D696 Thrombocytopenia, unspecified: Secondary | ICD-10-CM | POA: Insufficient documentation

## 2022-07-08 DIAGNOSIS — Z5111 Encounter for antineoplastic chemotherapy: Secondary | ICD-10-CM | POA: Diagnosis present

## 2022-07-08 DIAGNOSIS — E871 Hypo-osmolality and hyponatremia: Secondary | ICD-10-CM | POA: Diagnosis not present

## 2022-07-08 DIAGNOSIS — I509 Heart failure, unspecified: Secondary | ICD-10-CM | POA: Insufficient documentation

## 2022-07-08 DIAGNOSIS — F17290 Nicotine dependence, other tobacco product, uncomplicated: Secondary | ICD-10-CM | POA: Diagnosis not present

## 2022-07-08 DIAGNOSIS — I110 Hypertensive heart disease with heart failure: Secondary | ICD-10-CM | POA: Insufficient documentation

## 2022-07-08 DIAGNOSIS — C3432 Malignant neoplasm of lower lobe, left bronchus or lung: Secondary | ICD-10-CM | POA: Diagnosis not present

## 2022-07-08 LAB — CBC WITH DIFFERENTIAL/PLATELET
Abs Immature Granulocytes: 0.02 10*3/uL (ref 0.00–0.07)
Basophils Absolute: 0 10*3/uL (ref 0.0–0.1)
Basophils Relative: 1 %
Eosinophils Absolute: 0.1 10*3/uL (ref 0.0–0.5)
Eosinophils Relative: 2 %
HCT: 29.2 % — ABNORMAL LOW (ref 36.0–46.0)
Hemoglobin: 9.4 g/dL — ABNORMAL LOW (ref 12.0–15.0)
Immature Granulocytes: 0 %
Lymphocytes Relative: 13 %
Lymphs Abs: 0.7 10*3/uL (ref 0.7–4.0)
MCH: 28.3 pg (ref 26.0–34.0)
MCHC: 32.2 g/dL (ref 30.0–36.0)
MCV: 88 fL (ref 80.0–100.0)
Monocytes Absolute: 0.7 10*3/uL (ref 0.1–1.0)
Monocytes Relative: 12 %
Neutro Abs: 3.8 10*3/uL (ref 1.7–7.7)
Neutrophils Relative %: 72 %
Platelets: 139 10*3/uL — ABNORMAL LOW (ref 150–400)
RBC: 3.32 MIL/uL — ABNORMAL LOW (ref 3.87–5.11)
RDW: 17.5 % — ABNORMAL HIGH (ref 11.5–15.5)
WBC: 5.3 10*3/uL (ref 4.0–10.5)
nRBC: 0 % (ref 0.0–0.2)

## 2022-07-08 LAB — COMPREHENSIVE METABOLIC PANEL
ALT: 11 U/L (ref 0–44)
AST: 16 U/L (ref 15–41)
Albumin: 3.2 g/dL — ABNORMAL LOW (ref 3.5–5.0)
Alkaline Phosphatase: 57 U/L (ref 38–126)
Anion gap: 9 (ref 5–15)
BUN: 19 mg/dL (ref 8–23)
CO2: 28 mmol/L (ref 22–32)
Calcium: 8.9 mg/dL (ref 8.9–10.3)
Chloride: 102 mmol/L (ref 98–111)
Creatinine, Ser: 0.74 mg/dL (ref 0.44–1.00)
GFR, Estimated: >60 mL/min (ref >60)
Glucose, Bld: 267 mg/dL — ABNORMAL HIGH (ref 70–99)
Potassium: 4.1 mmol/L (ref 3.5–5.1)
Sodium: 139 mmol/L (ref 135–145)
Total Bilirubin: 0.4 mg/dL (ref 0.3–1.2)
Total Protein: 6.4 g/dL — ABNORMAL LOW (ref 6.5–8.1)

## 2022-07-08 MED ORDER — DEXTROSE 5 % IV SOLN
Freq: Once | INTRAVENOUS | Status: AC
  Filled 2022-07-08: qty 250

## 2022-07-08 MED ORDER — SODIUM CHLORIDE 0.9 % IV SOLN
2400.0000 mg/m2 | INTRAVENOUS | Status: DC
  Administered 2022-07-08: 4050 mg via INTRAVENOUS
  Filled 2022-07-08: qty 81

## 2022-07-08 MED ORDER — LEUCOVORIN CALCIUM INJECTION 350 MG
650.0000 mg | Freq: Once | INTRAVENOUS | Status: AC
  Administered 2022-07-08: 650 mg via INTRAVENOUS
  Filled 2022-07-08: qty 32.5

## 2022-07-08 MED ORDER — OXALIPLATIN CHEMO INJECTION 100 MG/20ML
85.0000 mg/m2 | Freq: Once | INTRAVENOUS | Status: AC
  Administered 2022-07-08: 145 mg via INTRAVENOUS
  Filled 2022-07-08: qty 20

## 2022-07-08 MED ORDER — FLUOROURACIL CHEMO INJECTION 2.5 GM/50ML
400.0000 mg/m2 | Freq: Once | INTRAVENOUS | Status: AC
  Administered 2022-07-08: 650 mg via INTRAVENOUS
  Filled 2022-07-08: qty 13

## 2022-07-08 MED ORDER — PALONOSETRON HCL INJECTION 0.25 MG/5ML
0.2500 mg | Freq: Once | INTRAVENOUS | Status: AC
  Administered 2022-07-08: 0.25 mg via INTRAVENOUS
  Filled 2022-07-08: qty 5

## 2022-07-08 MED ORDER — SODIUM CHLORIDE 0.9 % IV SOLN
10.0000 mg | Freq: Once | INTRAVENOUS | Status: AC
  Administered 2022-07-08: 10 mg via INTRAVENOUS
  Filled 2022-07-08: qty 10

## 2022-07-08 NOTE — Patient Instructions (Signed)
[facility] AT [place]  Discharge Instructions: Thank you for choosing [facility] to provide your oncology and hematology care.  If you have a lab appointment with the [facility], please go directly to the [facility] and check in at the registration area.  Wear comfortable clothing and clothing appropriate for easy access to any Portacath or PICC line.   We strive to give you quality time with your provider. You may need to reschedule your appointment if you arrive late (15 or more minutes).  Arriving late affects you and other patients whose appointments are after yours.  Also, if you miss three or more appointments without notifying the office, you may be dismissed from the clinic at the provider's discretion.      For prescription refill requests, have your pharmacy contact our office and allow 72 hours for refills to be completed.    Today you received the following chemotherapy and/or immunotherapy agents Eloxatin, Leucovorin, & Adrucil      To help prevent nausea and vomiting after your treatment, we encourage you to take your nausea medication as directed.  BELOW ARE SYMPTOMS THAT SHOULD BE REPORTED IMMEDIATELY: *FEVER GREATER THAN 100.4 F (38 C) OR HIGHER *CHILLS OR SWEATING *NAUSEA AND VOMITING THAT IS NOT CONTROLLED WITH YOUR NAUSEA MEDICATION *UNUSUAL SHORTNESS OF BREATH *UNUSUAL BRUISING OR BLEEDING *URINARY PROBLEMS (pain or burning when urinating, or frequent urination) *BOWEL PROBLEMS (unusual diarrhea, constipation, pain near the anus) TENDERNESS IN MOUTH AND THROAT WITH OR WITHOUT PRESENCE OF ULCERS (sore throat, sores in mouth, or a toothache) UNUSUAL RASH, SWELLING OR PAIN  UNUSUAL VAGINAL DISCHARGE OR ITCHING   Items with * indicate a potential emergency and should be followed up as soon as possible or go to the Emergency Department if any problems should occur.  Please show the CHEMOTHERAPY ALERT CARD or IMMUNOTHERAPY ALERT  CARD at check-in to the Emergency Department and triage nurse.  Should you have questions after your visit or need to cancel or reschedule your appointment, please contact [facility] [place] AT [place]  [phone] and follow the prompts.  Office hours are 8:00 a.m. to 4:30 p.m. Monday - Friday. Please note that voicemails left after 4:00 p.m. may not be returned until the following business day.  We are closed weekends and major holidays. You have access to a nurse at all times for urgent questions. Please call the main number to the clinic [phone] and follow the prompts.  For any non-urgent questions, you may also contact your provider using MyChart. We now offer e-Visits for anyone 25 and older to request care online for non-urgent symptoms. For details visit [email or web address].   Also download the MyChart app! Go to the app store, search "MyChart", open the app, select [facility], and log in with your MyChart username and password.  Masks are optional in the cancer centers. If you would like for your care team to wear a mask while they are taking care of you, please let them know. For doctor visits, patients may have with them one support person who is at least 73 years old. At this time, visitors are not allowed in the infusion area.

## 2022-07-10 ENCOUNTER — Inpatient Hospital Stay: Payer: Medicare Other

## 2022-07-10 ENCOUNTER — Other Ambulatory Visit: Payer: Self-pay | Admitting: Oncology

## 2022-07-10 VITALS — BP 137/82 | HR 80 | Resp 18

## 2022-07-10 DIAGNOSIS — C184 Malignant neoplasm of transverse colon: Secondary | ICD-10-CM

## 2022-07-10 DIAGNOSIS — Z5111 Encounter for antineoplastic chemotherapy: Secondary | ICD-10-CM | POA: Diagnosis not present

## 2022-07-10 MED ORDER — SODIUM CHLORIDE 0.9% FLUSH
10.0000 mL | Freq: Once | INTRAVENOUS | Status: AC
  Administered 2022-07-10: 10 mL via INTRAVENOUS
  Filled 2022-07-10: qty 10

## 2022-07-10 MED ORDER — HEPARIN SOD (PORK) LOCK FLUSH 100 UNIT/ML IV SOLN
500.0000 [IU] | Freq: Once | INTRAVENOUS | Status: AC
  Administered 2022-07-10: 500 [IU] via INTRAVENOUS
  Filled 2022-07-10: qty 5

## 2022-07-10 NOTE — Progress Notes (Signed)
ON PATHWAY REGIMEN - Colorectal  No Change  Continue With Treatment as Ordered.  Original Decision Date/Time: 06/10/2022 16:25     A cycle is every 14 days:     Oxaliplatin      Leucovorin      Fluorouracil      Fluorouracil   **Always confirm dose/schedule in your pharmacy ordering system**  Patient Characteristics: Postoperative without Neoadjuvant Therapy, M0 (Pathologic Staging), Colon, Stage III, High Risk (pT4 or pN2) Tumor Location: Colon Therapeutic Status: Postoperative without Neoadjuvant Therapy, M0 (Pathologic Staging) AJCC M Category: cM0 AJCC T Category: pT4b AJCC N Category: pN1c AJCC 8 Stage Grouping: IIIC Intent of Therapy: Curative Intent, Discussed with Patient

## 2022-07-21 MED FILL — Dexamethasone Sodium Phosphate Inj 100 MG/10ML: INTRAMUSCULAR | Qty: 1 | Status: AC

## 2022-07-22 ENCOUNTER — Other Ambulatory Visit: Payer: Self-pay

## 2022-07-22 ENCOUNTER — Inpatient Hospital Stay: Payer: Medicare Other

## 2022-07-22 ENCOUNTER — Emergency Department
Admission: EM | Admit: 2022-07-22 | Discharge: 2022-07-22 | Disposition: A | Discharge status: Home / Self Care | Payer: Medicare Other | Attending: Emergency Medicine | Admitting: Emergency Medicine

## 2022-07-22 ENCOUNTER — Inpatient Hospital Stay (HOSPITAL_BASED_OUTPATIENT_CLINIC_OR_DEPARTMENT_OTHER): Payer: Medicare Other | Admitting: Nurse Practitioner

## 2022-07-22 ENCOUNTER — Emergency Department: Payer: Medicare Other

## 2022-07-22 DIAGNOSIS — E1165 Type 2 diabetes mellitus with hyperglycemia: Secondary | ICD-10-CM | POA: Insufficient documentation

## 2022-07-22 DIAGNOSIS — I509 Heart failure, unspecified: Secondary | ICD-10-CM | POA: Diagnosis not present

## 2022-07-22 DIAGNOSIS — J449 Chronic obstructive pulmonary disease, unspecified: Secondary | ICD-10-CM | POA: Insufficient documentation

## 2022-07-22 DIAGNOSIS — R3589 Other polyuria: Secondary | ICD-10-CM | POA: Diagnosis not present

## 2022-07-22 DIAGNOSIS — Z5111 Encounter for antineoplastic chemotherapy: Secondary | ICD-10-CM | POA: Diagnosis not present

## 2022-07-22 DIAGNOSIS — R739 Hyperglycemia, unspecified: Secondary | ICD-10-CM

## 2022-07-22 DIAGNOSIS — N3090 Cystitis, unspecified without hematuria: Secondary | ICD-10-CM

## 2022-07-22 DIAGNOSIS — C184 Malignant neoplasm of transverse colon: Secondary | ICD-10-CM | POA: Diagnosis not present

## 2022-07-22 LAB — COMPREHENSIVE METABOLIC PANEL
ALT: 14 U/L (ref 0–44)
ALT: 14 U/L (ref 0–44)
AST: 20 U/L (ref 15–41)
AST: 17 U/L (ref 15–41)
Albumin: 3.5 g/dL (ref 3.5–5.0)
Albumin: 3.5 g/dL (ref 3.5–5.0)
Alkaline Phosphatase: 79 U/L (ref 38–126)
Alkaline Phosphatase: 77 U/L (ref 38–126)
Anion gap: 11 (ref 5–15)
Anion gap: 10 (ref 5–15)
BUN: 19 mg/dL (ref 8–23)
BUN: 18 mg/dL (ref 8–23)
CO2: 29 mmol/L (ref 22–32)
CO2: 29 mmol/L (ref 22–32)
Calcium: 9.1 mg/dL (ref 8.9–10.3)
Calcium: 9.1 mg/dL (ref 8.9–10.3)
Chloride: 91 mmol/L — ABNORMAL LOW (ref 98–111)
Chloride: 94 mmol/L — ABNORMAL LOW (ref 98–111)
Creatinine, Ser: 0.9 mg/dL (ref 0.44–1.00)
Creatinine, Ser: 0.98 mg/dL (ref 0.44–1.00)
GFR, Estimated: >60 mL/min (ref >60)
GFR, Estimated: >60 mL/min (ref >60)
Glucose, Bld: 592 mg/dL (ref 70–99)
Glucose, Bld: 519 mg/dL (ref 70–99)
Potassium: 4.2 mmol/L (ref 3.5–5.1)
Potassium: 4.2 mmol/L (ref 3.5–5.1)
Sodium: 131 mmol/L — ABNORMAL LOW (ref 135–145)
Sodium: 133 mmol/L — ABNORMAL LOW (ref 135–145)
Total Bilirubin: 0.8 mg/dL (ref 0.3–1.2)
Total Bilirubin: 1 mg/dL (ref 0.3–1.2)
Total Protein: 6.8 g/dL (ref 6.5–8.1)
Total Protein: 6.5 g/dL (ref 6.5–8.1)

## 2022-07-22 LAB — CBC WITH DIFFERENTIAL/PLATELET
Abs Immature Granulocytes: 0.03 10*3/uL (ref 0.00–0.07)
Basophils Absolute: 0 10*3/uL (ref 0.0–0.1)
Basophils Relative: 1 %
Eosinophils Absolute: 0.2 10*3/uL (ref 0.0–0.5)
Eosinophils Relative: 3 %
HCT: 32.3 % — ABNORMAL LOW (ref 36.0–46.0)
Hemoglobin: 10.6 g/dL — ABNORMAL LOW (ref 12.0–15.0)
Immature Granulocytes: 1 %
Lymphocytes Relative: 16 %
Lymphs Abs: 0.9 10*3/uL (ref 0.7–4.0)
MCH: 28.1 pg (ref 26.0–34.0)
MCHC: 32.8 g/dL (ref 30.0–36.0)
MCV: 85.7 fL (ref 80.0–100.0)
Monocytes Absolute: 0.9 10*3/uL (ref 0.1–1.0)
Monocytes Relative: 15 %
Neutro Abs: 3.7 10*3/uL (ref 1.7–7.7)
Neutrophils Relative %: 64 %
Platelets: 157 10*3/uL (ref 150–400)
RBC: 3.77 MIL/uL — ABNORMAL LOW (ref 3.87–5.11)
RDW: 18.5 % — ABNORMAL HIGH (ref 11.5–15.5)
WBC: 5.7 10*3/uL (ref 4.0–10.5)
nRBC: 0 % (ref 0.0–0.2)

## 2022-07-22 LAB — CBC
HCT: 31.6 % — ABNORMAL LOW (ref 36.0–46.0)
Hemoglobin: 10.1 g/dL — ABNORMAL LOW (ref 12.0–15.0)
MCH: 27.3 pg (ref 26.0–34.0)
MCHC: 32 g/dL (ref 30.0–36.0)
MCV: 85.4 fL (ref 80.0–100.0)
Platelets: 148 10*3/uL — ABNORMAL LOW (ref 150–400)
RBC: 3.7 MIL/uL — ABNORMAL LOW (ref 3.87–5.11)
RDW: 18.4 % — ABNORMAL HIGH (ref 11.5–15.5)
WBC: 5.9 10*3/uL (ref 4.0–10.5)
nRBC: 0 % (ref 0.0–0.2)

## 2022-07-22 LAB — URINALYSIS, ROUTINE W REFLEX MICROSCOPIC
Bilirubin Urine: NEGATIVE
Glucose, UA: 150 mg/dL — AB
Hgb urine dipstick: NEGATIVE
Ketones, ur: NEGATIVE mg/dL
Nitrite: NEGATIVE
Protein, ur: NEGATIVE mg/dL
Specific Gravity, Urine: 1.016 (ref 1.005–1.030)
pH: 5 (ref 5.0–8.0)

## 2022-07-22 LAB — TROPONIN I (HIGH SENSITIVITY)
Troponin I (High Sensitivity): 13 ng/L (ref <18)
Troponin I (High Sensitivity): 14 ng/L (ref <18)

## 2022-07-22 LAB — CBG MONITORING, ED
Glucose-Capillary: 525 mg/dL (ref 70–99)
Glucose-Capillary: 390 mg/dL — ABNORMAL HIGH (ref 70–99)
Glucose-Capillary: 356 mg/dL — ABNORMAL HIGH (ref 70–99)
Glucose-Capillary: 247 mg/dL — ABNORMAL HIGH (ref 70–99)
Glucose-Capillary: 234 mg/dL — ABNORMAL HIGH (ref 70–99)

## 2022-07-22 MED ORDER — SODIUM CHLORIDE 0.9 % IV BOLUS
1000.0000 mL | Freq: Once | INTRAVENOUS | Status: AC
  Administered 2022-07-22: 1000 mL via INTRAVENOUS

## 2022-07-22 MED ORDER — HEPARIN SOD (PORK) LOCK FLUSH 100 UNIT/ML IV SOLN
500.0000 [IU] | Freq: Once | INTRAVENOUS | Status: AC
Start: 2022-07-22 — End: 2022-07-22
  Administered 2022-07-22: 500 [IU] via INTRAVENOUS
  Filled 2022-07-22: qty 5

## 2022-07-22 MED ORDER — CEPHALEXIN 500 MG PO CAPS: 500.0000 mg | ORAL_CAPSULE | Freq: Two times a day (BID) | ORAL | 0 refills | Status: AC

## 2022-07-22 MED ORDER — INSULIN ASPART 100 UNIT/ML IJ SOLN
5.0000 [IU] | Freq: Once | INTRAMUSCULAR | Status: AC
  Administered 2022-07-22: 5 [IU] via INTRAVENOUS
  Filled 2022-07-22: qty 1

## 2022-07-22 NOTE — Discharge Instructions (Addendum)
Increase Lantus to 10 units daily.  Take antibiotics for possible UTI but your urine culture is still pending See your doctor this week for further blood sugar management.  Return to the ER for fevers, worsening pain weakness or any other concerns

## 2022-07-22 NOTE — ED Provider Notes (Signed)
6:44 PM Pending trop and UA for dc   Repeat troponin is negative.  UA does have some leukocytes and some WBCs.  She did report some increased urination that could just be from the glucose but given she is on chemotherapy we discussed doing a course of antibiotics just to be safe.  We will send urine for urine culture.  Patient reports feeling much better and they are going to go up on her insulin at home and she feels comfortable with discharge home     Vanessa Watersmeet, MD 07/22/22 [phone]

## 2022-07-22 NOTE — Progress Notes (Signed)
Pt returns for follow-up and treatment today. She reports that she is doing well. Reports minimal/occasional nausea, relived with mediation. Reports intermittent low back pain, rates 6/10 when in pain. None at this time; requests refill of oxycodone.

## 2022-07-22 NOTE — ED Triage Notes (Signed)
First Nurse Note:  Sent from cancer center for ED evaluation due to elevated CBG.  Per report, CBG:  592.  Patient has history of colon CA and currently receiving chemo.  Patient arrives AAOx3.  Skin warm and dry. NAD

## 2022-07-22 NOTE — Progress Notes (Signed)
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  INTERVAL HISTORY: Patient returns to clinic today for further evaluation and consideration of cycle 3 of 12 of adjuvant FOLFOX.  She has no neurologic complaints.  She denies any recent fevers.  She has a fair appetite, but denies weight loss.  She has no chest pain, shortness of breath, cough, or hemoptysis.  She denies any nausea, vomiting, constipation, or diarrhea.  She has no melena or hematochezia.  Patient offers no specific complaints today.  REVIEW OF SYSTEMS:   Review of Systems  Constitutional: Negative.  Negative for fever, malaise/fatigue and weight loss.  Respiratory: Negative.  Negative for cough, hemoptysis and shortness of breath.   Cardiovascular: Negative.  Negative for chest pain and leg swelling.  Gastrointestinal: Negative.  Negative for abdominal pain, blood in stool, constipation, diarrhea, melena, nausea and vomiting.  Genitourinary: Negative.  Negative for dysuria.  Musculoskeletal: Negative.  Negative for back pain.  Skin: Negative.  Negative for rash.  Neurological: Negative.  Negative for dizziness, focal weakness, weakness and headaches.  Psychiatric/Behavioral: Negative.  The patient is not nervous/anxious.   As per HPI. Otherwise, a complete review of systems is negative.  PAST MEDICAL HISTORY: Past Medical History:  Diagnosis Date   Anemia    Anxiety    Asthma    Cancer ([place])    colon   CHF (congestive heart failure) (HCC)    COPD (chronic obstructive pulmonary disease) ([place])    Diabetes mellitus without complication ([place])    Hyperlipidemia    Hypertension    Stroke ([place])     PAST SURGICAL  HISTORY: Past Surgical History:  Procedure Laterality Date   COLONOSCOPY WITH PROPOFOL N/A 02/03/2022   Procedure: COLONOSCOPY WITH PROPOFOL;  Surgeon: Lin Landsman, MD;  Location: [facility] ENDOSCOPY;  Service: Gastroenterology;  Laterality: N/A;   ESOPHAGOGASTRODUODENOSCOPY (EGD) WITH PROPOFOL N/A 02/03/2022   Procedure: ESOPHAGOGASTRODUODENOSCOPY (EGD) WITH PROPOFOL;  Surgeon: Lin Landsman, MD;  Location: [facility] ENDOSCOPY;  Service: Gastroenterology;  Laterality: N/A;   GIVENS CAPSULE STUDY N/A 02/03/2022   Procedure: GIVENS CAPSULE STUDY;  Surgeon: Lin Landsman, MD;  Location: [facility] ENDOSCOPY;  Service: Gastroenterology;  Laterality: N/A;  possible Capsule Study   IR IMAGING GUIDED PORT INSERTION  06/18/2022    FAMILY HISTORY: No family history on file.  ADVANCED DIRECTIVES (Y/N):  N  HEALTH MAINTENANCE: Social History   Tobacco Use   Smoking status: Every Day    Types: E-cigarettes  Substance Use Topics   Alcohol use: Yes    Comment: rare     Colonoscopy:  PAP:  Bone density:  Lipid panel:  Allergies  Allergen Reactions   Codeine Rash    Happened in 1970s    Current Outpatient Medications  Medication Sig Dispense Refill   acetaminophen (TYLENOL) 325 MG tablet Take 650 mg by mouth every 6 (six) hours as needed.     albuterol (VENTOLIN HFA) 108 (90 Base) MCG/ACT inhaler Inhale into the lungs.     atorvastatin (LIPITOR) 40 MG tablet Take 40 mg by mouth daily.     enoxaparin (LOVENOX) 40 MG/0.4ML injection Inject into the skin. (Patient not taking: Reported on 06/24/2022)     furosemide (LASIX) 40  MG tablet Take 40 mg by mouth daily.     gabapentin (NEURONTIN) 600 MG tablet Take by mouth 2 (two) times daily.     LANTUS SOLOSTAR 100 UNIT/ML Solostar Pen Inject 6 Units into the skin at bedtime.     metoprolol succinate (TOPROL-XL) 25 MG 24 hr tablet Take 1 tablet (25 mg total) by mouth daily. 30 tablet 0   oxyCODONE (OXY IR/ROXICODONE) 5 MG immediate release  tablet Take 1 tablet (5 mg total) by mouth every 4 (four) hours as needed for severe pain. 30 tablet 0   pantoprazole (PROTONIX) 40 MG tablet Take 1 tablet (40 mg total) by mouth daily. 30 tablet 5   ranolazine (RANEXA) 500 MG 12 hr tablet Take 500 mg by mouth 2 (two) times daily.     venlafaxine XR (EFFEXOR-XR) 75 MG 24 hr capsule Take 75 mg by mouth daily.     No current facility-administered medications for this visit.    OBJECTIVE: Vitals:   07/22/22 0906  BP: (!) 121/58  Pulse: 87  Resp: 16  Temp: 98.7 F (37.1 C)  SpO2: 96%      Body mass index is 23.52 kg/m.    ECOG FS:1 - Symptomatic but completely ambulatory  General: Well-developed, well-nourished, no acute distress. Eyes: Pink conjunctiva, anicteric sclera. Lungs: Clear to auscultation bilaterally.  No audible wheezing or coughing Heart: Regular rate and rhythm.  Abdomen: Soft, nontender, nondistended.  Musculoskeletal: No edema, cyanosis, or clubbing. Neuro: Alert, answering all questions appropriately. Cranial nerves grossly intact. Skin: No rashes or petechiae noted. Psych: Normal affect.   LAB RESULTS: Lab Results  Component Value Date   NA 131 (L) 07/22/2022   K 4.2 07/22/2022   CL 91 (L) 07/22/2022   CO2 29 07/22/2022   GLUCOSE 592 (HH) 07/22/2022   BUN 19 07/22/2022   CREATININE 0.90 07/22/2022   CALCIUM 9.1 07/22/2022   PROT 6.8 07/22/2022   ALBUMIN 3.5 07/22/2022   AST 20 07/22/2022   ALT 14 07/22/2022   ALKPHOS 79 07/22/2022   BILITOT 0.8 07/22/2022   GFRNONAA >60 07/22/2022    Lab Results  Component Value Date   WBC 5.7 07/22/2022   NEUTROABS 3.7 07/22/2022   HGB 10.6 (L) 07/22/2022   HCT 32.3 (L) 07/22/2022   MCV 85.7 07/22/2022   PLT 157 07/22/2022     STUDIES: No results found.  ASSESSMENT: Stage IIIc colon cancer.  PLAN:    Stage IIIc colon cancer: Patient underwent complete surgical resection at [facility] on May 23, 2022.  Large colon mass was noted to partially  invade stomach as well as surrounding mesentery.  PET scan did not reveal any metastatic disease other than a positive lung nodule which was confirmed to be a second primary.  Patient's preop CEA was 171.  Patient will require adjuvant chemotherapy using FOLFOX every 2 weeks x 12.  Patient has now had port placement. Hold cycle 3 today d/t hyperglycemia. RTC in 1 week for re-evaluation.  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  Patient completed XRT in May 2023.  Continue monitoring with CT scan every 6 months.   Pain: will refill oxycodone today. PDMP referred.  Anemia: Patient's hemoglobin is trended down to 9.4, monitor. Thrombocytopenia: Mild, proceed with treatment as above. Hyperglycemia: chronically elevated sugars, acutely worse. Glucose 592 today. On lantus only. Refer to er for management.  Port- in place and functioning well. Will  leave accessed for ER Hyponatremia- secondary to elevated sugars.    Patient expressed understanding and was in agreement with this plan. She also understands that She can call clinic at any time with any questions, concerns, or complaints.    Cancer Staging  Colon cancer [facility]) Staging form: Colon and Rectum, AJCC 8th Edition - Pathologic stage from 06/10/2022: Stage IIIC (pT4b, pN1c, cM0) - Signed by Lloyd Huger, MD on 06/10/2022 Stage prefix: Initial diagnosis Total positive nodes: 0 Histologic grading system: 4 grade system Histologic grade (G): G3   Verlon Au, NP   07/22/2022

## 2022-07-22 NOTE — ED Notes (Signed)
Pt gives verbal consent to dc

## 2022-07-22 NOTE — ED Triage Notes (Signed)
Pt here with hyperglycemia from the cancer center. Pt was taking metformin but was taken off due to her receiving chemotherapy. Pt denies pain and N/V. Pt also states her gait is off balance.

## 2022-07-22 NOTE — ED Provider Notes (Addendum)
[facility] Provider Note    Event Date/Time   First MD Initiated Contact with Patient 07/22/22 1105     (approximate)   History   Hyperglycemia   HPI  Jessica Stout is a 71 y.o. female   Past medical history of cancer on chemotherapy who presents from her chemotherapy infusion clinic today for elevated blood sugar.  She also has a history of type 2 diabetes, CHF, COPD.  She states that her cancer doctor in coordination with her primary doctor have decreased her Lantus and discontinued her metformin several weeks ago.  She used to take 12 units of Lantus and metformin.  She now only takes 6 units of Lantus daily.    In the clinic her blood sugar was 500+.    Upon review of systems, patient states that she has been feeling progressively more fatigued, polyuria and polydipsia over the past several weeks since her medication changed.  She does not frequently check her blood sugars at home so she is unsure of what her blood sugars have reacted to the medication change.     She otherwise denies respiratory infectious symptoms, change in her baseline abdominal pain, bowel movements, urinary symptoms, chest pain.  History was obtained via patient and her partner who is at bedside.      Physical Exam   Triage Vital Signs: ED Triage Vitals  Enc Vitals Group     BP 07/22/22 1030 104/61     Pulse Rate 07/22/22 1030 81     Resp 07/22/22 1030 15     Temp 07/22/22 1030 97.9 F (36.6 C)     Temp Source 07/22/22 1030 Oral     SpO2 07/22/22 1030 91 %     Weight 07/22/22 1025 136 lb 14.5 oz (62.1 kg)     Height 07/22/22 1025 5\' 4"  (1.626 m)     Head Circumference --      Peak Flow --      Pain Score 07/22/22 1025 0     Pain Loc --      Pain Edu? --      Excl. in [place]? --     Most recent vital signs: Vitals:   07/22/22 1530 07/22/22 1600  BP: 137/65 111/62  Pulse: 67 80  Resp:    Temp:    SpO2: 96% 94%    General: Awake, no distress.  Has  not. CV:  dry mucous membranes and poor skin turgor, slowed cap refill. Resp:  Normal effort.  Clear to auscultation bilaterally without focality wheezing or rales. Abd:  No distention.  Nontender   ED Results / Procedures / Treatments   Labs (all labs ordered are listed, but only abnormal results are displayed) Labs Reviewed  CBC - Abnormal; Notable for the following components:      Result Value   RBC 3.70 (*)    Hemoglobin 10.1 (*)    HCT 31.6 (*)    RDW 18.4 (*)    Platelets 148 (*)    All other components within normal limits  COMPREHENSIVE METABOLIC PANEL - Abnormal; Notable for the following components:   Sodium 133 (*)    Chloride 94 (*)    Glucose, Bld 519 (*)    All other components within normal limits  CBG MONITORING, ED - Abnormal; Notable for the following components:   Glucose-Capillary 525 (*)    All other components within normal limits  CBG MONITORING, ED - Abnormal; Notable for the following components:  Glucose-Capillary 390 (*)    All other components within normal limits  CBG MONITORING, ED - Abnormal; Notable for the following components:   Glucose-Capillary 356 (*)    All other components within normal limits  CBG MONITORING, ED - Abnormal; Notable for the following components:   Glucose-Capillary 247 (*)    All other components within normal limits  URINALYSIS, ROUTINE W REFLEX MICROSCOPIC  TROPONIN I (HIGH SENSITIVITY)  TROPONIN I (HIGH SENSITIVITY)   EKG: Obtained at 1653 Normal sinus rhythm with a rate of 69. Poor tracing, but within limitations no ischemic changes noted, normal axis, intervals noted by computer to be long QT but does not appear to be so on my readings.  I reviewed labs and they are notable for glycemia 519 with no anion gap   RADIOLOGY I dependently reviewed and interpreted chest x-ray which shows no overt pulmonary edema, pneumothorax, focal consolidations.   PROCEDURES:  Critical Care performed:  No  Procedures   MEDICATIONS ORDERED IN ED: Medications  sodium chloride 0.9 % bolus 1,000 mL (0 mLs Intravenous Stopped 07/22/22 1317)  sodium chloride 0.9 % bolus 1,000 mL (1,000 mLs Intravenous New Bag/Given 07/22/22 1418)  insulin aspart (novoLOG) injection 5 Units (5 Units Intravenous Given 07/22/22 1419)    IMPRESSION / MDM / ASSESSMENT AND PLAN / ED COURSE  I reviewed the triage vital signs and the nursing notes.                              Differential diagnosis includes, but is not limited to, glycemia, evaluate for DKA, underlying provoking events like ACS, stroke, infection.  MDM: Hyperglycemia without elevated anion gap not in DKA.  Otherwise looks well and no focal infectious symptoms or clear inciting event other than the fact that her medication has been reduced recently. Check for inciting event like infection, ACS.  No symptoms of stroke to further work-up further for that. Give fluids and insulin for her hyperglycemia and advised her to increase her Lantus from 60 units daily to 10 units daily.  It had previously been 12 units daily and metformin.  If work-up unremarkable, and patient remains asymptomatic, with some reduction in her glucose today will be safe for discharge home with the adjustments in medications as above and close PMD follow-up.  Dispo: After careful consideration of this patient's presentation, medical and social risk factors, and evaluation in the emergency department I engaged in shared decision making with the patient and/or their representative to consider admission or observation and this patient was ultimately discharged because work-up as above unremarkable and symptoms improved, lowered glucose, and plan for follow-up..   Patient's presentation is most consistent with acute presentation with potential threat to life or bodily function.       FINAL CLINICAL IMPRESSION(S) / ED DIAGNOSES   Final diagnoses:  Hyperglycemia     Rx / DC  Orders   ED Discharge Orders     None        Note:  This document was prepared using Dragon voice recognition software and may include unintentional dictation errors.    Lucillie Garfinkel, MD 07/22/22 1622    Lucillie Garfinkel, MD 07/22/22 [phone]

## 2022-07-23 LAB — URINE CULTURE: Culture: NO GROWTH

## 2022-07-24 ENCOUNTER — Inpatient Hospital Stay: Payer: Medicare Other

## 2022-07-29 ENCOUNTER — Inpatient Hospital Stay (HOSPITAL_BASED_OUTPATIENT_CLINIC_OR_DEPARTMENT_OTHER): Payer: Medicare Other | Admitting: Nurse Practitioner

## 2022-07-29 ENCOUNTER — Other Ambulatory Visit: Payer: Self-pay | Admitting: Oncology

## 2022-07-29 ENCOUNTER — Inpatient Hospital Stay: Payer: Medicare Other

## 2022-07-29 VITALS — BP 127/56 | HR 83 | Temp 97.0°F | Resp 16 | Wt 138.0 lb

## 2022-07-29 DIAGNOSIS — Z794 Long term (current) use of insulin: Secondary | ICD-10-CM | POA: Diagnosis not present

## 2022-07-29 DIAGNOSIS — E119 Type 2 diabetes mellitus without complications: Secondary | ICD-10-CM

## 2022-07-29 DIAGNOSIS — C184 Malignant neoplasm of transverse colon: Secondary | ICD-10-CM

## 2022-07-29 DIAGNOSIS — Z5111 Encounter for antineoplastic chemotherapy: Secondary | ICD-10-CM | POA: Diagnosis not present

## 2022-07-29 LAB — COMPREHENSIVE METABOLIC PANEL
ALT: 11 U/L (ref 0–44)
AST: 18 U/L (ref 15–41)
Albumin: 3.7 g/dL (ref 3.5–5.0)
Alkaline Phosphatase: 71 U/L (ref 38–126)
Anion gap: 7 (ref 5–15)
BUN: 18 mg/dL (ref 8–23)
CO2: 29 mmol/L (ref 22–32)
Calcium: 9.3 mg/dL (ref 8.9–10.3)
Chloride: 100 mmol/L (ref 98–111)
Creatinine, Ser: 0.84 mg/dL (ref 0.44–1.00)
GFR, Estimated: >60 mL/min (ref >60)
Glucose, Bld: 231 mg/dL — ABNORMAL HIGH (ref 70–99)
Potassium: 3.8 mmol/L (ref 3.5–5.1)
Sodium: 136 mmol/L (ref 135–145)
Total Bilirubin: 0.6 mg/dL (ref 0.3–1.2)
Total Protein: 7 g/dL (ref 6.5–8.1)

## 2022-07-29 LAB — CBC WITH DIFFERENTIAL/PLATELET
Abs Immature Granulocytes: 0.04 10*3/uL (ref 0.00–0.07)
Basophils Absolute: 0.1 10*3/uL (ref 0.0–0.1)
Basophils Relative: 1 %
Eosinophils Absolute: 0.1 10*3/uL (ref 0.0–0.5)
Eosinophils Relative: 2 %
HCT: 34.1 % — ABNORMAL LOW (ref 36.0–46.0)
Hemoglobin: 10.9 g/dL — ABNORMAL LOW (ref 12.0–15.0)
Immature Granulocytes: 1 %
Lymphocytes Relative: 16 %
Lymphs Abs: 1.2 10*3/uL (ref 0.7–4.0)
MCH: 28.1 pg (ref 26.0–34.0)
MCHC: 32 g/dL (ref 30.0–36.0)
MCV: 87.9 fL (ref 80.0–100.0)
Monocytes Absolute: 0.9 10*3/uL (ref 0.1–1.0)
Monocytes Relative: 12 %
Neutro Abs: 5.3 10*3/uL (ref 1.7–7.7)
Neutrophils Relative %: 68 %
Platelets: 187 10*3/uL (ref 150–400)
RBC: 3.88 MIL/uL (ref 3.87–5.11)
RDW: 19.3 % — ABNORMAL HIGH (ref 11.5–15.5)
WBC: 7.5 10*3/uL (ref 4.0–10.5)
nRBC: 0 % (ref 0.0–0.2)

## 2022-07-29 MED ORDER — OXALIPLATIN CHEMO INJECTION 100 MG/20ML
85.0000 mg/m2 | Freq: Once | INTRAVENOUS | Status: AC
  Administered 2022-07-29: 145 mg via INTRAVENOUS
  Filled 2022-07-29: qty 10

## 2022-07-29 MED ORDER — FLUOROURACIL CHEMO INJECTION 2.5 GM/50ML
400.0000 mg/m2 | Freq: Once | INTRAVENOUS | Status: AC
  Administered 2022-07-29: 700 mg via INTRAVENOUS
  Filled 2022-07-29: qty 14

## 2022-07-29 MED ORDER — OXYCODONE HCL 5 MG PO TABS: 5.0000 mg | ORAL_TABLET | ORAL | 0 refills | Status: DC | PRN

## 2022-07-29 MED ORDER — DEXTROSE 5 % IV SOLN
Freq: Once | INTRAVENOUS | Status: AC
  Filled 2022-07-29: qty 250

## 2022-07-29 MED ORDER — SODIUM CHLORIDE 0.9 % IV SOLN
2400.0000 mg/m2 | INTRAVENOUS | Status: DC
  Administered 2022-07-29: 4100 mg via INTRAVENOUS
  Filled 2022-07-29: qty 82

## 2022-07-29 MED ORDER — LEUCOVORIN CALCIUM INJECTION 350 MG
409.0000 mg/m2 | Freq: Once | INTRAVENOUS | Status: AC
  Administered 2022-07-29: 700 mg via INTRAVENOUS
  Filled 2022-07-29: qty 35

## 2022-07-29 MED ORDER — SODIUM CHLORIDE 0.9 % IV SOLN
10.0000 mg | Freq: Once | INTRAVENOUS | Status: AC
  Administered 2022-07-29: 10 mg via INTRAVENOUS
  Filled 2022-07-29: qty 10

## 2022-07-29 MED ORDER — PALONOSETRON HCL INJECTION 0.25 MG/5ML
0.2500 mg | Freq: Once | INTRAVENOUS | Status: AC
  Administered 2022-07-29: 0.25 mg via INTRAVENOUS
  Filled 2022-07-29: qty 5

## 2022-07-29 NOTE — Patient Instructions (Signed)
[facility] [place]  Discharge Instructions: Thank you for choosing [facility] to provide your oncology and hematology care.  If you have a lab appointment with the [facility], please go directly to the [facility] and check in at the registration area.  Wear comfortable clothing and clothing appropriate for easy access to any Portacath or PICC line.   We strive to give you quality time with your provider. You may need to reschedule your appointment if you arrive late (15 or more minutes).  Arriving late affects you and other patients whose appointments are after yours.  Also, if you miss three or more appointments without notifying the office, you may be dismissed from the clinic at the provider's discretion.      For prescription refill requests, have your pharmacy contact our office and allow 72 hours for refills to be completed.    Today you received the following chemotherapy and/or immunotherapy agents Eloxatin, Leucovorin & Adrucil      To help prevent nausea and vomiting after your treatment, we encourage you to take your nausea medication as directed.  BELOW ARE SYMPTOMS THAT SHOULD BE REPORTED IMMEDIATELY: *FEVER GREATER THAN 100.4 F (38 C) OR HIGHER *CHILLS OR SWEATING *NAUSEA AND VOMITING THAT IS NOT CONTROLLED WITH YOUR NAUSEA MEDICATION *UNUSUAL SHORTNESS OF BREATH *UNUSUAL BRUISING OR BLEEDING *URINARY PROBLEMS (pain or burning when urinating, or frequent urination) *BOWEL PROBLEMS (unusual diarrhea, constipation, pain near the anus) TENDERNESS IN MOUTH AND THROAT WITH OR WITHOUT PRESENCE OF ULCERS (sore throat, sores in mouth, or a toothache) UNUSUAL RASH, SWELLING OR PAIN  UNUSUAL VAGINAL DISCHARGE OR ITCHING   Items with * indicate a potential emergency and should be followed up as soon as possible or go to the Emergency Department if any problems should occur.  Please show the CHEMOTHERAPY ALERT CARD or IMMUNOTHERAPY ALERT  CARD at check-in to the Emergency Department and triage nurse.  Should you have questions after your visit or need to cancel or reschedule your appointment, please contact [facility] [place] [facility] [place]  [phone] and follow the prompts.  Office hours are 8:00 a.m. to 4:30 p.m. Monday - Friday. Please note that voicemails left after 4:00 p.m. may not be returned until the following business day.  We are closed weekends and major holidays. You have access to a nurse at all times for urgent questions. Please call the main number to the clinic [phone] and follow the prompts.  For any non-urgent questions, you may also contact your provider using MyChart. We now offer e-Visits for anyone 33 and older to request care online for non-urgent symptoms. For details visit [email or web address].   Also download the MyChart app! Go to the app store, search "MyChart", open the app, select [facility], and log in with your MyChart username and password.  Masks are optional in the cancer centers. If you would like for your care team to wear a mask while they are taking care of you, please let them know. For doctor visits, patients may have with them one support person who is at least 73 years old. At this time, visitors are not allowed in the infusion area.

## 2022-07-29 NOTE — Progress Notes (Signed)
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  INTERVAL HISTORY: Patient returns to clinic today for further evaluation and re-consideration of cycle 3 of 12 of adjuvant FOLFOX. Last week she was found to have blood sugar > 500 and was sent to ER. She was not in ketoacidosis but did receive IV fluids and insulin. She was started on short acting insulin and continues long acting insulin as well. She has no neurologic complaints.  She denies any recent fevers.  She has a fair appetite, but denies weight loss.  She has no chest pain, shortness of breath, cough, or hemoptysis.  She denies any nausea, vomiting, constipation, or diarrhea.  She has no melena or hematochezia.  Patient offers no specific complaints today.  REVIEW OF SYSTEMS:   Review of Systems  Constitutional: Negative.  Negative for fever, malaise/fatigue and weight loss.  Respiratory: Negative.  Negative for cough, hemoptysis and shortness of breath.   Cardiovascular: Negative.  Negative for chest pain and leg swelling.  Gastrointestinal: Negative.  Negative for abdominal pain, blood in stool, constipation, diarrhea, melena, nausea and vomiting.  Genitourinary: Negative.  Negative for dysuria.  Musculoskeletal: Negative.  Negative for back pain.  Skin: Negative.  Negative for rash.  Neurological: Negative.  Negative for dizziness, focal weakness, weakness and headaches.  Psychiatric/Behavioral: Negative.  The patient is not nervous/anxious.   As per HPI. Otherwise, a complete review of systems is negative.  PAST MEDICAL HISTORY: Past Medical History:  Diagnosis Date   Anemia    Anxiety    Asthma    Cancer ([place])    colon    CHF (congestive heart failure) (HCC)    COPD (chronic obstructive pulmonary disease) ([place])    Diabetes mellitus without complication ([place])    Hyperlipidemia    Hypertension    Stroke ([place])     PAST SURGICAL HISTORY: Past Surgical History:  Procedure Laterality Date   COLONOSCOPY WITH PROPOFOL N/A 02/03/2022   Procedure: COLONOSCOPY WITH PROPOFOL;  Surgeon: Lin Landsman, MD;  Location: [facility] ENDOSCOPY;  Service: Gastroenterology;  Laterality: N/A;   ESOPHAGOGASTRODUODENOSCOPY (EGD) WITH PROPOFOL N/A 02/03/2022   Procedure: ESOPHAGOGASTRODUODENOSCOPY (EGD) WITH PROPOFOL;  Surgeon: Lin Landsman, MD;  Location: [facility] ENDOSCOPY;  Service: Gastroenterology;  Laterality: N/A;   GIVENS CAPSULE STUDY N/A 02/03/2022   Procedure: GIVENS CAPSULE STUDY;  Surgeon: Lin Landsman, MD;  Location: [facility] ENDOSCOPY;  Service: Gastroenterology;  Laterality: N/A;  possible Capsule Study   IR IMAGING GUIDED PORT INSERTION  06/18/2022    FAMILY HISTORY: No family history on file.  ADVANCED DIRECTIVES (Y/N):  N  HEALTH MAINTENANCE: Social History   Tobacco Use   Smoking status: Every Day    Types: E-cigarettes  Substance Use Topics   Alcohol use: Yes    Comment: rare     Colonoscopy:  PAP:  Bone density:  Lipid panel:  Allergies  Allergen Reactions   Codeine Rash    Happened in 1970s    Current Outpatient Medications  Medication Sig Dispense Refill   acetaminophen (TYLENOL) 325 MG tablet Take 650 mg by mouth every 6 (six) hours as needed.     albuterol (VENTOLIN HFA) 108 (90 Base) MCG/ACT inhaler Inhale into the lungs.  atorvastatin (LIPITOR) 40 MG tablet Take 40 mg by mouth daily.     cephALEXin (KEFLEX) 500 MG capsule Take 1 capsule (500 mg total) by mouth 2 (two) times daily for 7 days. 14 capsule 0   furosemide (LASIX) 40 MG tablet Take 40 mg by mouth daily.     gabapentin (NEURONTIN) 600 MG tablet Take by mouth 2 (two) times daily.     insulin lispro (HUMALOG) 100  UNIT/ML injection Inject 1-10 Units into the skin 3 (three) times daily before meals. Per sliding scale     LANTUS SOLOSTAR 100 UNIT/ML Solostar Pen Inject 10 Units into the skin at bedtime.     metoprolol succinate (TOPROL-XL) 25 MG 24 hr tablet Take 1 tablet (25 mg total) by mouth daily. 30 tablet 0   oxyCODONE (OXY IR/ROXICODONE) 5 MG immediate release tablet Take 1 tablet (5 mg total) by mouth every 4 (four) hours as needed for severe pain. 30 tablet 0   pantoprazole (PROTONIX) 40 MG tablet Take 1 tablet (40 mg total) by mouth daily. 30 tablet 5   ranolazine (RANEXA) 500 MG 12 hr tablet Take 500 mg by mouth 2 (two) times daily.     sitaGLIPtin-metformin (JANUMET) 50-1000 MG tablet Take 1 tablet by mouth 2 (two) times daily with a meal.     venlafaxine XR (EFFEXOR-XR) 75 MG 24 hr capsule Take 75 mg by mouth daily.     No current facility-administered medications for this visit.    OBJECTIVE: Vitals:   07/29/22 0914  BP: (!) 127/56  Pulse: 83  Resp: 16  Temp: (!) 97 F (36.1 C)  SpO2: 97%      There is no height or weight on file to calculate BMI.      ECOG FS:1 - Symptomatic but completely ambulatory  General: Well-developed, well-nourished, no acute distress. Eyes: Pink conjunctiva, anicteric sclera. Lungs: Clear to auscultation bilaterally.  No audible wheezing or coughing Heart: Regular rate and rhythm.  Abdomen: Soft, nontender, nondistended.  Musculoskeletal: No edema, cyanosis, or clubbing. Neuro: Alert, answering all questions appropriately. Cranial nerves grossly intact. Skin: No rashes or petechiae noted. Psych: Normal affect.   LAB RESULTS: Lab Results  Component Value Date   NA 136 07/29/2022   K 3.8 07/29/2022   CL 100 07/29/2022   CO2 29 07/29/2022   GLUCOSE 231 (H) 07/29/2022   BUN 18 07/29/2022   CREATININE 0.84 07/29/2022   CALCIUM 9.3 07/29/2022   PROT 7.0 07/29/2022   ALBUMIN 3.7 07/29/2022   AST 18 07/29/2022   ALT 11 07/29/2022   ALKPHOS 71  07/29/2022   BILITOT 0.6 07/29/2022   GFRNONAA >60 07/29/2022    Lab Results  Component Value Date   WBC 7.5 07/29/2022   NEUTROABS 5.3 07/29/2022   HGB 10.9 (L) 07/29/2022   HCT 34.1 (L) 07/29/2022   MCV 87.9 07/29/2022   PLT 187 07/29/2022    STUDIES: DG Chest Port 1 View  Result Date: 07/22/2022 CLINICAL DATA:  Weakness. EXAM: PORTABLE CHEST 1 VIEW COMPARISON:  03/07/2022 and prior studies FINDINGS: The cardiomediastinal silhouette is unremarkable. A RIGHT IJ Port-A-Cath is present with tip overlying the SUPERIOR cavoatrial junction. There is no evidence of focal airspace disease, pulmonary edema, pleural effusion, or pneumothorax. No acute bony abnormalities are identified. IMPRESSION: No acute abnormality. Electronically Signed   By: Margarette Canada M.D.   On: 07/22/2022 11:37    ASSESSMENT: Stage IIIc colon cancer.  PLAN:    Stage IIIc colon cancer: Patient underwent  complete surgical resection at [facility] on May 23, 2022.  Large colon mass was noted to partially invade stomach as well as surrounding mesentery.  PET scan did not reveal any metastatic disease other than a positive lung nodule which was confirmed to be a second primary.  Patient's preop CEA was 171.  Patient will require adjuvant chemotherapy using FOLFOX every 2 weeks x 12.  Patient has now had port placement. Labs reviewed and acceptable for treatment. Proceed with cycle 3 of folfox chemotherapy.   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  Patient completed XRT in May 2023.  Continue monitoring with CT scan every 6 months. Scheduled for 08/14/22.   Pain: chronic neoplastic pain. PDMP reviewed and oxycodone refilled today.  Anemia: Patient's hemoglobin has improved to 10.9. Ferritin was 11 on 01/30/22. Will recheck at next visit.  Thrombocytopenia: Mild, proceed with treatment as above. Hyperglycemia: chronically elevated sugars. S/p ER visit  last week for glucose of 592. Now on sliding scale and lantus long acting. Improved but persistently elevated. In 200s this morning. Hasn't taken morning insulin. Encouraged compliance with sliding scale and dietary discretion.  Port- in place and functioning well.  Hyponatremia- secondary to elevated sugars. Now improved to 136.   Disposition:  Folfox cycle 3 today.  Rtc in 2 weeks for port/lab (cbc, cmp, cea, ferritin & iron studies), Dr. Grayland Ormond, C4 folfox chemotherapy.    Patient expressed understanding and was in agreement with this plan. She also understands that She can call clinic at any time with any questions, concerns, or complaints.    Cancer Staging  Colon cancer [facility]) Staging form: Colon and Rectum, AJCC 8th Edition - Pathologic stage from 06/10/2022: Stage IIIC (pT4b, pN1c, cM0) - Signed by Lloyd Huger, MD on 06/10/2022 Stage prefix: Initial diagnosis Total positive nodes: 0 Histologic grading system: 4 grade system Histologic grade (G): G3   Verlon Au, NP   07/29/2022

## 2022-07-29 NOTE — Progress Notes (Signed)
Returns for follow-up. Was sent to ED last visit due to elevated blood sugar. Has since followed-up with PCP and had medication adjustments. Pt reports fatigue, otherwise, states that she is feeling well. She notes a discomfort to sacral area. Upon inspection, small area of macerated skin breakdown noted. Education provided; frequent repositioning, hygiene, application of skin barrier cream. Pt verbalized understanding. Needs a refill on oxycodone.

## 2022-07-30 ENCOUNTER — Inpatient Hospital Stay: Payer: Medicare Other

## 2022-07-31 ENCOUNTER — Inpatient Hospital Stay: Payer: Medicare Other

## 2022-07-31 VITALS — BP 124/75

## 2022-07-31 DIAGNOSIS — C184 Malignant neoplasm of transverse colon: Secondary | ICD-10-CM

## 2022-07-31 DIAGNOSIS — Z5111 Encounter for antineoplastic chemotherapy: Secondary | ICD-10-CM | POA: Diagnosis not present

## 2022-07-31 MED ORDER — SODIUM CHLORIDE 0.9% FLUSH
10.0000 mL | INTRAVENOUS | Status: DC | PRN
  Administered 2022-07-31: 10 mL
  Filled 2022-07-31: qty 10

## 2022-07-31 MED ORDER — HEPARIN SOD (PORK) LOCK FLUSH 100 UNIT/ML IV SOLN
500.0000 [IU] | Freq: Once | INTRAVENOUS | Status: AC | PRN
  Administered 2022-07-31: 500 [IU]
  Filled 2022-07-31: qty 5

## 2022-08-05 ENCOUNTER — Ambulatory Visit: Payer: Medicare Other

## 2022-08-05 ENCOUNTER — Ambulatory Visit: Payer: Medicare Other | Admitting: Oncology

## 2022-08-05 ENCOUNTER — Other Ambulatory Visit: Payer: Medicare Other

## 2022-08-11 ENCOUNTER — Ambulatory Visit: Payer: Medicare Other

## 2022-08-11 ENCOUNTER — Ambulatory Visit: Payer: Medicare Other | Admitting: Nurse Practitioner

## 2022-08-11 ENCOUNTER — Other Ambulatory Visit: Payer: Medicare Other

## 2022-08-11 MED FILL — Dexamethasone Sodium Phosphate Inj 100 MG/10ML: INTRAMUSCULAR | Qty: 1 | Status: AC

## 2022-08-11 NOTE — Progress Notes (Unsigned)
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  INTERVAL HISTORY: Patient returns to clinic today for further evaluation and consideration of cycle 4 of 12 of adjuvant FOLFOX.  She has altered taste and sores in her mouth, but otherwise is tolerating her treatment without significant side effects.  She has no neurologic complaints.  She denies any recent fevers.  She has a fair appetite, but denies weight loss.  She has no chest pain, shortness of breath, cough, or hemoptysis.  She denies any nausea, vomiting, constipation, or diarrhea.  She has no melena or hematochezia.  Patient has no further specific complaints today.  REVIEW OF SYSTEMS:   Review of Systems  Constitutional: Negative.  Negative for fever, malaise/fatigue and weight loss.  Respiratory: Negative.  Negative for cough, hemoptysis and shortness of breath.   Cardiovascular: Negative.  Negative for chest pain and leg swelling.  Gastrointestinal: Negative.  Negative for abdominal pain, blood in stool, constipation, diarrhea, melena, nausea and vomiting.  Genitourinary: Negative.  Negative for dysuria.  Musculoskeletal: Negative.  Negative for back pain.  Skin: Negative.  Negative for rash.  Neurological: Negative.  Negative for dizziness, focal weakness, weakness and headaches.  Psychiatric/Behavioral: Negative.  The patient is not nervous/anxious.     As per HPI. Otherwise, a complete review of systems is negative.  PAST MEDICAL HISTORY: Past Medical History:  Diagnosis Date   Anemia    Anxiety    Asthma    Cancer ([place])    colon   CHF (congestive heart failure) (HCC)    COPD (chronic obstructive pulmonary disease)  ([place])    Diabetes mellitus without complication ([place])    Hyperlipidemia    Hypertension    Stroke ([place])     PAST SURGICAL HISTORY: Past Surgical History:  Procedure Laterality Date   COLONOSCOPY WITH PROPOFOL N/A 02/03/2022   Procedure: COLONOSCOPY WITH PROPOFOL;  Surgeon: Lin Landsman, MD;  Location: [facility] ENDOSCOPY;  Service: Gastroenterology;  Laterality: N/A;   ESOPHAGOGASTRODUODENOSCOPY (EGD) WITH PROPOFOL N/A 02/03/2022   Procedure: ESOPHAGOGASTRODUODENOSCOPY (EGD) WITH PROPOFOL;  Surgeon: Lin Landsman, MD;  Location: [facility] ENDOSCOPY;  Service: Gastroenterology;  Laterality: N/A;   GIVENS CAPSULE STUDY N/A 02/03/2022   Procedure: GIVENS CAPSULE STUDY;  Surgeon: Lin Landsman, MD;  Location: [facility] ENDOSCOPY;  Service: Gastroenterology;  Laterality: N/A;  possible Capsule Study   IR IMAGING GUIDED PORT INSERTION  06/18/2022    FAMILY HISTORY: History reviewed. No pertinent family history.  ADVANCED DIRECTIVES (Y/N):  N  HEALTH MAINTENANCE: Social History   Tobacco Use   Smoking status: Every Day    Types: E-cigarettes  Substance Use Topics   Alcohol use: Yes    Comment: rare     Colonoscopy:  PAP:  Bone density:  Lipid panel:  Allergies  Allergen Reactions   Codeine Rash    Happened in 1970s    Current Outpatient Medications  Medication Sig Dispense Refill   acetaminophen (TYLENOL) 325 MG tablet Take 650 mg by mouth every 6 (six) hours as needed.     albuterol (VENTOLIN HFA) 108 (90 Base) MCG/ACT inhaler Inhale into the lungs.     atorvastatin (LIPITOR) 40 MG tablet Take 40 mg by mouth daily.  furosemide (LASIX) 40 MG tablet Take 40 mg by mouth daily.     gabapentin (NEURONTIN) 600 MG tablet Take by mouth 2 (two) times daily.     insulin lispro (HUMALOG) 100 UNIT/ML injection Inject 1-10 Units into the skin 3 (three) times daily before meals. Per sliding scale     LANTUS SOLOSTAR 100 UNIT/ML Solostar Pen Inject 10 Units into the skin at bedtime.      metoprolol succinate (TOPROL-XL) 25 MG 24 hr tablet Take 1 tablet (25 mg total) by mouth daily. 30 tablet 0   oxyCODONE (OXY IR/ROXICODONE) 5 MG immediate release tablet Take 1 tablet (5 mg total) by mouth every 4 (four) hours as needed for severe pain. 30 tablet 0   pantoprazole (PROTONIX) 40 MG tablet Take 1 tablet (40 mg total) by mouth daily. 30 tablet 5   ranolazine (RANEXA) 500 MG 12 hr tablet Take 500 mg by mouth 2 (two) times daily.     sitaGLIPtin-metformin (JANUMET) 50-1000 MG tablet Take 1 tablet by mouth 2 (two) times daily with a meal.     venlafaxine XR (EFFEXOR-XR) 75 MG 24 hr capsule Take 75 mg by mouth daily.     No current facility-administered medications for this visit.    OBJECTIVE: Vitals:   08/12/22 0932  BP: 126/66  Pulse: 85  Resp: 16  Temp: (!) 97.5 F (36.4 C)  SpO2: 97%      Body mass index is 24.15 kg/m.    ECOG FS:1 - Symptomatic but completely ambulatory  General: Well-developed, well-nourished, no acute distress. Eyes: Pink conjunctiva, anicteric sclera. HEENT: Normocephalic, moist mucous membranes. Lungs: No audible wheezing or coughing. Heart: Regular rate and rhythm. Abdomen: Soft, nontender, no obvious distention. Musculoskeletal: No edema, cyanosis, or clubbing. Neuro: Alert, answering all questions appropriately. Cranial nerves grossly intact. Skin: No rashes or petechiae noted. Psych: Normal affect.  LAB RESULTS:  Lab Results  Component Value Date   NA 136 07/29/2022   K 3.8 07/29/2022   CL 100 07/29/2022   CO2 29 07/29/2022   GLUCOSE 231 (H) 07/29/2022   BUN 18 07/29/2022   CREATININE 0.84 07/29/2022   CALCIUM 9.3 07/29/2022   PROT 7.0 07/29/2022   ALBUMIN 3.7 07/29/2022   AST 18 07/29/2022   ALT 11 07/29/2022   ALKPHOS 71 07/29/2022   BILITOT 0.6 07/29/2022   GFRNONAA >60 07/29/2022    Lab Results  Component Value Date   WBC 3.9 (L) 08/12/2022   NEUTROABS 2.4 08/12/2022   HGB 9.8 (L) 08/12/2022   HCT 29.9 (L)  08/12/2022   MCV 86.9 08/12/2022   PLT 153 08/12/2022     STUDIES: DG Chest Port 1 View  Result Date: 07/22/2022 CLINICAL DATA:  Weakness. EXAM: PORTABLE CHEST 1 VIEW COMPARISON:  03/07/2022 and prior studies FINDINGS: The cardiomediastinal silhouette is unremarkable. A RIGHT IJ Port-A-Cath is present with tip overlying the SUPERIOR cavoatrial junction. There is no evidence of focal airspace disease, pulmonary edema, pleural effusion, or pneumothorax. No acute bony abnormalities are identified. IMPRESSION: No acute abnormality. Electronically Signed   By: Margarette Canada M.D.   On: 07/22/2022 11:37    ASSESSMENT: Stage IIIc colon cancer.  PLAN:    Stage IIIc colon cancer: Patient underwent complete surgical resection at [facility] on May 23, 2022.  Large colon mass was noted to partially invade stomach as well as surrounding mesentery.  PET scan did not reveal any metastatic disease other than a positive lung nodule which was confirmed to be a  second primary.  Patient's preop CEA was 171.  Patient will require adjuvant chemotherapy using FOLFOX every 2 weeks x 12.  Patient has now had port placement.  Proceed with cycle 4 of treatment today.  Return to clinic in 2 days for pump removal and then in 2 weeks for further evaluation and consideration of cycle 5.     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  Patient completed XRT in May 2023.  Continue monitoring with CT scan every 6 months.   Pain: Resolved. Anemia: Unchanged.  Patient's hemoglobin is 9.8 today.   Thrombocytopenia: Resolved.  Hyperglycemia: Patient has improved blood glucose control. Hypokalemia: Proceed to treatment as above.  Patient will also receive 40 mEq IV potassium today.  She was also given a prescription for 20 mEq oral potassium twice per day.  Patient expressed understanding and was in agreement with this plan. She also understands that She can call  clinic at any time with any questions, concerns, or complaints.    Cancer Staging  Colon cancer [facility]) Staging form: Colon and Rectum, AJCC 8th Edition - Pathologic stage from 06/10/2022: Stage IIIC (pT4b, pN1c, cM0) - Signed by Lloyd Huger, MD on 06/10/2022 Stage prefix: Initial diagnosis Total positive nodes: 0 Histologic grading system: 4 grade system Histologic grade (G): G3   Lloyd Huger, MD   08/12/2022 10:09 AM

## 2022-08-12 ENCOUNTER — Inpatient Hospital Stay (HOSPITAL_BASED_OUTPATIENT_CLINIC_OR_DEPARTMENT_OTHER): Payer: Medicare Other | Admitting: Oncology

## 2022-08-12 ENCOUNTER — Inpatient Hospital Stay: Payer: Medicare Other | Attending: Oncology

## 2022-08-12 ENCOUNTER — Inpatient Hospital Stay: Payer: Medicare Other

## 2022-08-12 ENCOUNTER — Encounter: Payer: Self-pay | Admitting: Oncology

## 2022-08-12 ENCOUNTER — Telehealth: Payer: Self-pay

## 2022-08-12 VITALS — BP 126/66 | HR 85 | Temp 97.5°F | Resp 16 | Ht 64.0 in | Wt 140.7 lb

## 2022-08-12 DIAGNOSIS — C189 Malignant neoplasm of colon, unspecified: Secondary | ICD-10-CM | POA: Diagnosis present

## 2022-08-12 DIAGNOSIS — Z5111 Encounter for antineoplastic chemotherapy: Secondary | ICD-10-CM | POA: Insufficient documentation

## 2022-08-12 DIAGNOSIS — F17290 Nicotine dependence, other tobacco product, uncomplicated: Secondary | ICD-10-CM | POA: Diagnosis not present

## 2022-08-12 DIAGNOSIS — E876 Hypokalemia: Secondary | ICD-10-CM

## 2022-08-12 DIAGNOSIS — D649 Anemia, unspecified: Secondary | ICD-10-CM | POA: Insufficient documentation

## 2022-08-12 DIAGNOSIS — C7802 Secondary malignant neoplasm of left lung: Secondary | ICD-10-CM | POA: Insufficient documentation

## 2022-08-12 DIAGNOSIS — E1165 Type 2 diabetes mellitus with hyperglycemia: Secondary | ICD-10-CM | POA: Insufficient documentation

## 2022-08-12 DIAGNOSIS — I509 Heart failure, unspecified: Secondary | ICD-10-CM | POA: Diagnosis not present

## 2022-08-12 DIAGNOSIS — I110 Hypertensive heart disease with heart failure: Secondary | ICD-10-CM | POA: Insufficient documentation

## 2022-08-12 DIAGNOSIS — C184 Malignant neoplasm of transverse colon: Secondary | ICD-10-CM

## 2022-08-12 LAB — COMPREHENSIVE METABOLIC PANEL
ALT: 10 U/L (ref 0–44)
AST: 19 U/L (ref 15–41)
Albumin: 3.4 g/dL — ABNORMAL LOW (ref 3.5–5.0)
Alkaline Phosphatase: 54 U/L (ref 38–126)
Anion gap: 9 (ref 5–15)
BUN: 19 mg/dL (ref 8–23)
CO2: 27 mmol/L (ref 22–32)
Calcium: 8.5 mg/dL — ABNORMAL LOW (ref 8.9–10.3)
Chloride: 104 mmol/L (ref 98–111)
Creatinine, Ser: 0.72 mg/dL (ref 0.44–1.00)
GFR, Estimated: >60 mL/min (ref >60)
Glucose, Bld: 153 mg/dL — ABNORMAL HIGH (ref 70–99)
Potassium: 2.7 mmol/L — CL (ref 3.5–5.1)
Sodium: 140 mmol/L (ref 135–145)
Total Bilirubin: 0.7 mg/dL (ref 0.3–1.2)
Total Protein: 6.6 g/dL (ref 6.5–8.1)

## 2022-08-12 LAB — CBC WITH DIFFERENTIAL/PLATELET
Abs Immature Granulocytes: 0.01 10*3/uL (ref 0.00–0.07)
Basophils Absolute: 0 10*3/uL (ref 0.0–0.1)
Basophils Relative: 0 %
Eosinophils Absolute: 0.1 10*3/uL (ref 0.0–0.5)
Eosinophils Relative: 2 %
HCT: 29.9 % — ABNORMAL LOW (ref 36.0–46.0)
Hemoglobin: 9.8 g/dL — ABNORMAL LOW (ref 12.0–15.0)
Immature Granulocytes: 0 %
Lymphocytes Relative: 18 %
Lymphs Abs: 0.7 10*3/uL (ref 0.7–4.0)
MCH: 28.5 pg (ref 26.0–34.0)
MCHC: 32.8 g/dL (ref 30.0–36.0)
MCV: 86.9 fL (ref 80.0–100.0)
Monocytes Absolute: 0.7 10*3/uL (ref 0.1–1.0)
Monocytes Relative: 17 %
Neutro Abs: 2.4 10*3/uL (ref 1.7–7.7)
Neutrophils Relative %: 63 %
Platelets: 153 10*3/uL (ref 150–400)
RBC: 3.44 MIL/uL — ABNORMAL LOW (ref 3.87–5.11)
RDW: 18.6 % — ABNORMAL HIGH (ref 11.5–15.5)
WBC: 3.9 10*3/uL — ABNORMAL LOW (ref 4.0–10.5)
nRBC: 0 % (ref 0.0–0.2)

## 2022-08-12 MED ORDER — SODIUM CHLORIDE 0.9 % IV SOLN
10.0000 mg | Freq: Once | INTRAVENOUS | Status: AC
  Administered 2022-08-12: 10 mg via INTRAVENOUS
  Filled 2022-08-12: qty 10

## 2022-08-12 MED ORDER — OXALIPLATIN CHEMO INJECTION 100 MG/20ML
85.0000 mg/m2 | Freq: Once | INTRAVENOUS | Status: AC
  Administered 2022-08-12: 145 mg via INTRAVENOUS
  Filled 2022-08-12: qty 20

## 2022-08-12 MED ORDER — FLUOROURACIL CHEMO INJECTION 2.5 GM/50ML
400.0000 mg/m2 | Freq: Once | INTRAVENOUS | Status: AC
  Administered 2022-08-12: 700 mg via INTRAVENOUS
  Filled 2022-08-12: qty 14

## 2022-08-12 MED ORDER — SODIUM CHLORIDE 0.9 % IV SOLN: 40.0000 meq | Freq: Once | INTRAVENOUS | Status: DC

## 2022-08-12 MED ORDER — POTASSIUM CHLORIDE 20 MEQ/100ML IV SOLN
20.0000 meq | Freq: Once | INTRAVENOUS | Status: AC
  Administered 2022-08-12: 20 meq via INTRAVENOUS

## 2022-08-12 MED ORDER — SODIUM CHLORIDE 0.9 % IV SOLN
2400.0000 mg/m2 | INTRAVENOUS | Status: DC
  Administered 2022-08-12: 4100 mg via INTRAVENOUS
  Filled 2022-08-12: qty 82

## 2022-08-12 MED ORDER — DEXTROSE 5 % IV SOLN
Freq: Once | INTRAVENOUS | Status: AC
  Filled 2022-08-12: qty 250

## 2022-08-12 MED ORDER — LEUCOVORIN CALCIUM INJECTION 350 MG
700.0000 mg | Freq: Once | INTRAVENOUS | Status: AC
  Administered 2022-08-12: 700 mg via INTRAVENOUS
  Filled 2022-08-12: qty 35

## 2022-08-12 MED ORDER — PALONOSETRON HCL INJECTION 0.25 MG/5ML
0.2500 mg | Freq: Once | INTRAVENOUS | Status: AC
  Administered 2022-08-12: 0.25 mg via INTRAVENOUS
  Filled 2022-08-12: qty 5

## 2022-08-12 NOTE — Telephone Encounter (Signed)
Magic mouthwash rx sent to [facility].

## 2022-08-12 NOTE — Patient Instructions (Signed)
[facility] CANCER CTR AT [place]  Discharge Instructions: Thank you for choosing [place] to provide your oncology and hematology care.  If you have a lab appointment with the [facility], please go directly to the [place] and check in at the registration area.  Wear comfortable clothing and clothing appropriate for easy access to any Portacath or PICC line.   We strive to give you quality time with your provider. You may need to reschedule your appointment if you arrive late (15 or more minutes).  Arriving late affects you and other patients whose appointments are after yours.  Also, if you miss three or more appointments without notifying the office, you may be dismissed from the clinic at the provider's discretion.      For prescription refill requests, have your pharmacy contact our office and allow 72 hours for refills to be completed.    Today you received the following chemotherapy and/or immunotherapy agents Eloxatin, Leucovorin, & Adrucil      To help prevent nausea and vomiting after your treatment, we encourage you to take your nausea medication as directed.  BELOW ARE SYMPTOMS THAT SHOULD BE REPORTED IMMEDIATELY: *FEVER GREATER THAN 100.4 F (38 C) OR HIGHER *CHILLS OR SWEATING *NAUSEA AND VOMITING THAT IS NOT CONTROLLED WITH YOUR NAUSEA MEDICATION *UNUSUAL SHORTNESS OF BREATH *UNUSUAL BRUISING OR BLEEDING *URINARY PROBLEMS (pain or burning when urinating, or frequent urination) *BOWEL PROBLEMS (unusual diarrhea, constipation, pain near the anus) TENDERNESS IN MOUTH AND THROAT WITH OR WITHOUT PRESENCE OF ULCERS (sore throat, sores in mouth, or a toothache) UNUSUAL RASH, SWELLING OR PAIN  UNUSUAL VAGINAL DISCHARGE OR ITCHING   Items with * indicate a potential emergency and should be followed up as soon as possible or go to the Emergency Department if any problems should occur.  Please show the CHEMOTHERAPY ALERT CARD or IMMUNOTHERAPY ALERT  CARD at check-in to the Emergency Department and triage nurse.  Should you have questions after your visit or need to cancel or reschedule your appointment, please contact [facility] CANCER [place] AT [place]  [phone] and follow the prompts.  Office hours are 8:00 a.m. to 4:30 p.m. Monday - Friday. Please note that voicemails left after 4:00 p.m. may not be returned until the following business day.  We are closed weekends and major holidays. You have access to a nurse at all times for urgent questions. Please call the main number to the clinic [phone] and follow the prompts.  For any non-urgent questions, you may also contact your provider using MyChart. We now offer e-Visits for anyone 101 and older to request care online for non-urgent symptoms. For details visit [email or web address].   Also download the MyChart app! Go to the app store, search "MyChart", open the app, select [place], and log in with your MyChart username and password.  Masks are optional in the cancer centers. If you would like for your care team to wear a mask while they are taking care of you, please let them know. For doctor visits, patients may have with them one support person who is at least 73 years old. At this time, visitors are not allowed in the infusion area.

## 2022-08-14 ENCOUNTER — Ambulatory Visit: Payer: Medicare Other

## 2022-08-14 ENCOUNTER — Inpatient Hospital Stay: Payer: Medicare Other

## 2022-08-14 DIAGNOSIS — Z5111 Encounter for antineoplastic chemotherapy: Secondary | ICD-10-CM | POA: Diagnosis not present

## 2022-08-14 DIAGNOSIS — C184 Malignant neoplasm of transverse colon: Secondary | ICD-10-CM

## 2022-08-14 MED ORDER — SODIUM CHLORIDE 0.9% FLUSH
10.0000 mL | INTRAVENOUS | Status: DC | PRN
  Administered 2022-08-14: 10 mL
  Filled 2022-08-14: qty 10

## 2022-08-14 MED ORDER — HEPARIN SOD (PORK) LOCK FLUSH 100 UNIT/ML IV SOLN
500.0000 [IU] | Freq: Once | INTRAVENOUS | Status: AC | PRN
  Administered 2022-08-14: 500 [IU]
  Filled 2022-08-14: qty 5

## 2022-08-19 ENCOUNTER — Ambulatory Visit: Payer: Medicare Other

## 2022-08-19 ENCOUNTER — Ambulatory Visit: Payer: Medicare Other | Admitting: Oncology

## 2022-08-19 ENCOUNTER — Other Ambulatory Visit: Payer: Medicare Other

## 2022-08-21 ENCOUNTER — Ambulatory Visit: Payer: Medicare Other | Admitting: Radiation Oncology

## 2022-08-21 ENCOUNTER — Other Ambulatory Visit: Payer: Self-pay | Admitting: Oncology

## 2022-08-22 ENCOUNTER — Ambulatory Visit: Admission: RE | Admit: 2022-08-22 | Payer: Medicare Other | Source: Ambulatory Visit

## 2022-08-22 NOTE — Progress Notes (Unsigned)
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  INTERVAL HISTORY: Patient returns to clinic today for further evaluation and consideration of cycle 5 of 12 of adjuvant FOLFOX.  She has increased fatigue, but otherwise is tolerating her treatments well.  She has no neurologic complaints.  She denies any recent fevers.  She has a fair appetite, but denies weight loss.  She has no chest pain, shortness of breath, cough, or hemoptysis.  She denies any nausea, vomiting, constipation, or diarrhea.  She has no melena or hematochezia.  Patient is no further specific complaints today.  REVIEW OF SYSTEMS:   Review of Systems  Constitutional:  Positive for malaise/fatigue. Negative for fever and weight loss.  Respiratory: Negative.  Negative for cough, hemoptysis and shortness of breath.   Cardiovascular: Negative.  Negative for chest pain and leg swelling.  Gastrointestinal: Negative.  Negative for abdominal pain, blood in stool, constipation, diarrhea, melena, nausea and vomiting.  Genitourinary: Negative.  Negative for dysuria.  Musculoskeletal: Negative.  Negative for back pain.  Skin: Negative.  Negative for rash.  Neurological: Negative.  Negative for dizziness, focal weakness, weakness and headaches.  Psychiatric/Behavioral: Negative.  The patient is not nervous/anxious.     As per HPI. Otherwise, a complete review of systems is negative.  PAST MEDICAL HISTORY: Past Medical History:  Diagnosis Date   Anemia    Anxiety    Asthma    Cancer ([place])    colon   CHF (congestive heart failure) (HCC)    COPD (chronic obstructive pulmonary disease) ([place])    Diabetes mellitus without  complication ([place])    Hyperlipidemia    Hypertension    Stroke ([place])     PAST SURGICAL HISTORY: Past Surgical History:  Procedure Laterality Date   COLONOSCOPY WITH PROPOFOL N/A 02/03/2022   Procedure: COLONOSCOPY WITH PROPOFOL;  Surgeon: Lin Landsman, MD;  Location: [facility] ENDOSCOPY;  Service: Gastroenterology;  Laterality: N/A;   ESOPHAGOGASTRODUODENOSCOPY (EGD) WITH PROPOFOL N/A 02/03/2022   Procedure: ESOPHAGOGASTRODUODENOSCOPY (EGD) WITH PROPOFOL;  Surgeon: Lin Landsman, MD;  Location: [facility] ENDOSCOPY;  Service: Gastroenterology;  Laterality: N/A;   GIVENS CAPSULE STUDY N/A 02/03/2022   Procedure: GIVENS CAPSULE STUDY;  Surgeon: Lin Landsman, MD;  Location: [facility] ENDOSCOPY;  Service: Gastroenterology;  Laterality: N/A;  possible Capsule Study   IR IMAGING GUIDED PORT INSERTION  06/18/2022    FAMILY HISTORY: History reviewed. No pertinent family history.  ADVANCED DIRECTIVES (Y/N):  N  HEALTH MAINTENANCE: Social History   Tobacco Use   Smoking status: Every Day    Types: E-cigarettes  Substance Use Topics   Alcohol use: Yes    Comment: rare     Colonoscopy:  PAP:  Bone density:  Lipid panel:  Allergies  Allergen Reactions   Codeine Rash    Happened in 1970s    Current Outpatient Medications  Medication Sig Dispense Refill   acetaminophen (TYLENOL) 325 MG tablet Take 650 mg by mouth every 6 (six) hours as needed.     albuterol (VENTOLIN HFA) 108 (90 Base) MCG/ACT inhaler Inhale into the lungs.     atorvastatin (LIPITOR) 40 MG tablet Take 40 mg by mouth daily.     furosemide (LASIX) 40 MG tablet  Take 40 mg by mouth daily.     gabapentin (NEURONTIN) 600 MG tablet Take by mouth 2 (two) times daily.     insulin lispro (HUMALOG) 100 UNIT/ML injection Inject 1-10 Units into the skin 3 (three) times daily before meals. Per sliding scale     LANTUS SOLOSTAR 100 UNIT/ML Solostar Pen Inject 10 Units into the skin at bedtime.     oxyCODONE (OXY IR/ROXICODONE) 5  MG immediate release tablet Take 1 tablet (5 mg total) by mouth every 4 (four) hours as needed for severe pain. 30 tablet 0   ranolazine (RANEXA) 500 MG 12 hr tablet Take 500 mg by mouth 2 (two) times daily.     sitaGLIPtin-metformin (JANUMET) 50-1000 MG tablet Take 1 tablet by mouth 2 (two) times daily with a meal.     venlafaxine XR (EFFEXOR-XR) 75 MG 24 hr capsule Take 75 mg by mouth daily.     metoprolol succinate (TOPROL-XL) 25 MG 24 hr tablet Take 1 tablet (25 mg total) by mouth daily. 30 tablet 0   pantoprazole (PROTONIX) 40 MG tablet Take 1 tablet (40 mg total) by mouth daily. 30 tablet 5   No current facility-administered medications for this visit.   Facility-Administered Medications Ordered in Other Visits  Medication Dose Route Frequency Provider Last Rate Last Admin   fluorouracil (ADRUCIL) 4,100 mg in sodium chloride 0.9 % 68 mL chemo infusion  2,400 mg/m2 (Treatment Plan Recorded) Intravenous 1 day or 1 dose Lloyd Huger, MD   Infusion Verify at 08/26/22 1346    OBJECTIVE: Vitals:   08/26/22 0848  BP: 128/68  Pulse: 80  Resp: 16  Temp: 99.2 F (37.3 C)  SpO2: 96%      Body mass index is 24.03 kg/m.    ECOG FS:1 - Symptomatic but completely ambulatory  General: Well-developed, well-nourished, no acute distress. Eyes: Pink conjunctiva, anicteric sclera. HEENT: Normocephalic, moist mucous membranes. Lungs: No audible wheezing or coughing. Heart: Regular rate and rhythm. Abdomen: Soft, nontender, no obvious distention. Musculoskeletal: No edema, cyanosis, or clubbing. Neuro: Alert, answering all questions appropriately. Cranial nerves grossly intact. Skin: No rashes or petechiae noted. Psych: Normal affect.  LAB RESULTS:  Lab Results  Component Value Date   NA 138 08/26/2022   K 3.8 08/26/2022   CL 102 08/26/2022   CO2 29 08/26/2022   GLUCOSE 228 (H) 08/26/2022   BUN 24 (H) 08/26/2022   CREATININE 0.82 08/26/2022   CALCIUM 9.0 08/26/2022   PROT 6.5  08/26/2022   ALBUMIN 3.2 (L) 08/26/2022   AST 17 08/26/2022   ALT 12 08/26/2022   ALKPHOS 59 08/26/2022   BILITOT 0.4 08/26/2022   GFRNONAA >60 08/26/2022    Lab Results  Component Value Date   WBC 3.1 (L) 08/26/2022   NEUTROABS 1.5 (L) 08/26/2022   HGB 9.4 (L) 08/26/2022   HCT 28.8 (L) 08/26/2022   MCV 89.4 08/26/2022   PLT 139 (L) 08/26/2022     STUDIES: No results found.  ASSESSMENT: Stage IIIc colon cancer.  PLAN:    Stage IIIc colon cancer: Patient underwent complete surgical resection at [facility] on May 23, 2022.  Large colon mass was noted to partially invade stomach as well as surrounding mesentery.  PET scan did not reveal any metastatic disease other than a positive lung nodule which was confirmed to be a second primary.  Patient's preop CEA was 171.  Patient will require adjuvant chemotherapy using FOLFOX every 2 weeks x 12.  Patient has now had  port placement.  Proceed with cycle 5 of treatment today.  Return to clinic in 2 days for pump removal and then in 2 weeks for further evaluation and consideration of cycle 6.   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  Patient completed XRT in May 2023.  Continue monitoring with CT scan every 6 months.   Pain: Resolved. Anemia: Hemoglobin has trended down slightly to 9.4. Thrombocytopenia: Mild, monitor. Hyperglycemia: Patient's blood glucose is 228, monitor. Hypokalemia: Resolved.  Continue dietary changes as recommended.   Patient expressed understanding and was in agreement with this plan. She also understands that She can call clinic at any time with any questions, concerns, or complaints.    Cancer Staging  Colon cancer [facility]) Staging form: Colon and Rectum, AJCC 8th Edition - Pathologic stage from 06/10/2022: Stage IIIC (pT4b, pN1c, cM0) - Signed by Lloyd Huger, MD on 06/10/2022 Stage prefix: Initial diagnosis Total positive nodes:  0 Histologic grading system: 4 grade system Histologic grade (G): G3   Lloyd Huger, MD   08/26/2022 2:27 PM

## 2022-08-25 ENCOUNTER — Other Ambulatory Visit: Payer: Self-pay | Admitting: *Deleted

## 2022-08-25 ENCOUNTER — Ambulatory Visit: Admission: RE | Admit: 2022-08-25 | Payer: Medicare Other | Source: Ambulatory Visit | Admitting: Radiation Oncology

## 2022-08-25 DIAGNOSIS — C3432 Malignant neoplasm of lower lobe, left bronchus or lung: Secondary | ICD-10-CM

## 2022-08-25 MED FILL — Dexamethasone Sodium Phosphate Inj 100 MG/10ML: INTRAMUSCULAR | Qty: 1 | Status: AC

## 2022-08-26 ENCOUNTER — Inpatient Hospital Stay (HOSPITAL_BASED_OUTPATIENT_CLINIC_OR_DEPARTMENT_OTHER): Payer: Medicare Other | Admitting: Oncology

## 2022-08-26 ENCOUNTER — Inpatient Hospital Stay: Payer: Medicare Other

## 2022-08-26 ENCOUNTER — Encounter: Payer: Self-pay | Admitting: Oncology

## 2022-08-26 VITALS — BP 128/68 | HR 80 | Temp 99.2°F | Resp 16 | Ht 64.0 in | Wt 140.0 lb

## 2022-08-26 DIAGNOSIS — C184 Malignant neoplasm of transverse colon: Secondary | ICD-10-CM

## 2022-08-26 DIAGNOSIS — Z5111 Encounter for antineoplastic chemotherapy: Secondary | ICD-10-CM | POA: Diagnosis not present

## 2022-08-26 LAB — CBC WITH DIFFERENTIAL/PLATELET
Abs Immature Granulocytes: 0.03 10*3/uL (ref 0.00–0.07)
Basophils Absolute: 0 10*3/uL (ref 0.0–0.1)
Basophils Relative: 1 %
Eosinophils Absolute: 0 10*3/uL (ref 0.0–0.5)
Eosinophils Relative: 1 %
HCT: 28.8 % — ABNORMAL LOW (ref 36.0–46.0)
Hemoglobin: 9.4 g/dL — ABNORMAL LOW (ref 12.0–15.0)
Immature Granulocytes: 1 %
Lymphocytes Relative: 28 %
Lymphs Abs: 0.9 10*3/uL (ref 0.7–4.0)
MCH: 29.2 pg (ref 26.0–34.0)
MCHC: 32.6 g/dL (ref 30.0–36.0)
MCV: 89.4 fL (ref 80.0–100.0)
Monocytes Absolute: 0.7 10*3/uL (ref 0.1–1.0)
Monocytes Relative: 22 %
Neutro Abs: 1.5 10*3/uL — ABNORMAL LOW (ref 1.7–7.7)
Neutrophils Relative %: 47 %
Platelets: 139 10*3/uL — ABNORMAL LOW (ref 150–400)
RBC: 3.22 MIL/uL — ABNORMAL LOW (ref 3.87–5.11)
RDW: 20.2 % — ABNORMAL HIGH (ref 11.5–15.5)
WBC: 3.1 10*3/uL — ABNORMAL LOW (ref 4.0–10.5)
nRBC: 0 % (ref 0.0–0.2)

## 2022-08-26 LAB — COMPREHENSIVE METABOLIC PANEL
ALT: 12 U/L (ref 0–44)
AST: 17 U/L (ref 15–41)
Albumin: 3.2 g/dL — ABNORMAL LOW (ref 3.5–5.0)
Alkaline Phosphatase: 59 U/L (ref 38–126)
Anion gap: 7 (ref 5–15)
BUN: 24 mg/dL — ABNORMAL HIGH (ref 8–23)
CO2: 29 mmol/L (ref 22–32)
Calcium: 9 mg/dL (ref 8.9–10.3)
Chloride: 102 mmol/L (ref 98–111)
Creatinine, Ser: 0.82 mg/dL (ref 0.44–1.00)
GFR, Estimated: >60 mL/min (ref >60)
Glucose, Bld: 228 mg/dL — ABNORMAL HIGH (ref 70–99)
Potassium: 3.8 mmol/L (ref 3.5–5.1)
Sodium: 138 mmol/L (ref 135–145)
Total Bilirubin: 0.4 mg/dL (ref 0.3–1.2)
Total Protein: 6.5 g/dL (ref 6.5–8.1)

## 2022-08-26 MED ORDER — LEUCOVORIN CALCIUM INJECTION 350 MG
700.0000 mg | Freq: Once | INTRAVENOUS | Status: AC
  Administered 2022-08-26: 700 mg via INTRAVENOUS
  Filled 2022-08-26: qty 35

## 2022-08-26 MED ORDER — SODIUM CHLORIDE 0.9 % IV SOLN
2400.0000 mg/m2 | INTRAVENOUS | Status: DC
  Administered 2022-08-26: 4100 mg via INTRAVENOUS
  Filled 2022-08-26: qty 82

## 2022-08-26 MED ORDER — SODIUM CHLORIDE 0.9 % IV SOLN
10.0000 mg | Freq: Once | INTRAVENOUS | Status: AC
  Administered 2022-08-26: 10 mg via INTRAVENOUS
  Filled 2022-08-26: qty 10

## 2022-08-26 MED ORDER — OXALIPLATIN CHEMO INJECTION 100 MG/20ML
85.0000 mg/m2 | Freq: Once | INTRAVENOUS | Status: AC
  Administered 2022-08-26: 145 mg via INTRAVENOUS
  Filled 2022-08-26: qty 20

## 2022-08-26 MED ORDER — PALONOSETRON HCL INJECTION 0.25 MG/5ML
0.2500 mg | Freq: Once | INTRAVENOUS | Status: AC
  Administered 2022-08-26: 0.25 mg via INTRAVENOUS
  Filled 2022-08-26: qty 5

## 2022-08-26 MED ORDER — FLUOROURACIL CHEMO INJECTION 2.5 GM/50ML
400.0000 mg/m2 | Freq: Once | INTRAVENOUS | Status: AC
  Administered 2022-08-26: 700 mg via INTRAVENOUS
  Filled 2022-08-26: qty 14

## 2022-08-26 MED ORDER — DEXTROSE 5 % IV SOLN
Freq: Once | INTRAVENOUS | Status: AC
  Filled 2022-08-26: qty 250

## 2022-08-26 NOTE — Patient Instructions (Signed)
[facility] CANCER CTR AT [place]  Discharge Instructions: Thank you for choosing [facility] to provide your oncology and hematology care.  If you have a lab appointment with the [facility], please go directly to the [facility] and check in at the registration area.  Wear comfortable clothing and clothing appropriate for easy access to any Portacath or PICC line.   We strive to give you quality time with your provider. You may need to reschedule your appointment if you arrive late (15 or more minutes).  Arriving late affects you and other patients whose appointments are after yours.  Also, if you miss three or more appointments without notifying the office, you may be dismissed from the clinic at the provider's discretion.      For prescription refill requests, have your pharmacy contact our office and allow 72 hours for refills to be completed.    Today you received the following chemotherapy and/or immunotherapy agents Fluorouracil and pump, Leucovorin, and Oxaliplatin.      To help prevent nausea and vomiting after your treatment, we encourage you to take your nausea medication as directed.  BELOW ARE SYMPTOMS THAT SHOULD BE REPORTED IMMEDIATELY: *FEVER GREATER THAN 100.4 F (38 C) OR HIGHER *CHILLS OR SWEATING *NAUSEA AND VOMITING THAT IS NOT CONTROLLED WITH YOUR NAUSEA MEDICATION *UNUSUAL SHORTNESS OF BREATH *UNUSUAL BRUISING OR BLEEDING *URINARY PROBLEMS (pain or burning when urinating, or frequent urination) *BOWEL PROBLEMS (unusual diarrhea, constipation, pain near the anus) TENDERNESS IN MOUTH AND THROAT WITH OR WITHOUT PRESENCE OF ULCERS (sore throat, sores in mouth, or a toothache) UNUSUAL RASH, SWELLING OR PAIN  UNUSUAL VAGINAL DISCHARGE OR ITCHING   Items with * indicate a potential emergency and should be followed up as soon as possible or go to the Emergency Department if any problems should occur.  Please show the CHEMOTHERAPY ALERT CARD or  IMMUNOTHERAPY ALERT CARD at check-in to the Emergency Department and triage nurse.  Should you have questions after your visit or need to cancel or reschedule your appointment, please contact [facility] CANCER [place] AT [place]  [phone] and follow the prompts.  Office hours are 8:00 a.m. to 4:30 p.m. Monday - Friday. Please note that voicemails left after 4:00 p.m. may not be returned until the following business day.  We are closed weekends and major holidays. You have access to a nurse at all times for urgent questions. Please call the main number to the clinic [phone] and follow the prompts.  For any non-urgent questions, you may also contact your provider using MyChart. We now offer e-Visits for anyone 26 and older to request care online for non-urgent symptoms. For details visit [email or web address].   Also download the MyChart app! Go to the app store, search "MyChart", open the app, select , and log in with your MyChart username and password.  Masks are optional in the cancer centers. If you would like for your care team to wear a mask while they are taking care of you, please let them know. For doctor visits, patients may have with them one support person who is at least 73 years old. At this time, visitors are not allowed in the infusion area.

## 2022-08-28 ENCOUNTER — Inpatient Hospital Stay: Payer: Medicare Other

## 2022-08-28 VITALS — BP 122/68 | HR 84 | Resp 18

## 2022-08-28 DIAGNOSIS — C184 Malignant neoplasm of transverse colon: Secondary | ICD-10-CM

## 2022-08-28 DIAGNOSIS — Z5111 Encounter for antineoplastic chemotherapy: Secondary | ICD-10-CM | POA: Diagnosis not present

## 2022-08-28 MED ORDER — HEPARIN SOD (PORK) LOCK FLUSH 100 UNIT/ML IV SOLN
500.0000 [IU] | Freq: Once | INTRAVENOUS | Status: AC | PRN
  Administered 2022-08-28: 500 [IU]
  Filled 2022-08-28: qty 5

## 2022-08-28 MED ORDER — SODIUM CHLORIDE 0.9% FLUSH
10.0000 mL | INTRAVENOUS | Status: DC | PRN
  Administered 2022-08-28: 10 mL
  Filled 2022-08-28: qty 10

## 2022-08-29 ENCOUNTER — Ambulatory Visit
Admission: RE | Admit: 2022-08-29 | Discharge: 2022-08-29 | Disposition: A | Discharge status: Home / Self Care | Payer: Medicare Other | Source: Ambulatory Visit | Attending: Radiation Oncology | Admitting: Radiation Oncology

## 2022-08-29 DIAGNOSIS — C3432 Malignant neoplasm of lower lobe, left bronchus or lung: Secondary | ICD-10-CM | POA: Diagnosis not present

## 2022-08-29 MED ORDER — IOHEXOL 300 MG/ML  SOLN
75.0000 mL | Freq: Once | INTRAMUSCULAR | Status: AC | PRN
  Administered 2022-08-29: 75 mL via INTRAVENOUS

## 2022-09-01 ENCOUNTER — Encounter: Payer: Self-pay | Admitting: Radiation Oncology

## 2022-09-01 ENCOUNTER — Ambulatory Visit
Admission: RE | Admit: 2022-09-01 | Discharge: 2022-09-01 | Disposition: A | Discharge status: Home / Self Care | Payer: Medicare Other | Source: Ambulatory Visit | Attending: Radiation Oncology | Admitting: Radiation Oncology

## 2022-09-01 VITALS — BP 123/65 | HR 91 | Temp 97.6°F | Resp 16 | Wt 139.4 lb

## 2022-09-01 DIAGNOSIS — C3432 Malignant neoplasm of lower lobe, left bronchus or lung: Secondary | ICD-10-CM | POA: Diagnosis present

## 2022-09-01 DIAGNOSIS — Z923 Personal history of irradiation: Secondary | ICD-10-CM | POA: Diagnosis not present

## 2022-09-01 DIAGNOSIS — R911 Solitary pulmonary nodule: Secondary | ICD-10-CM | POA: Diagnosis not present

## 2022-09-01 NOTE — Progress Notes (Signed)
Radiation Oncology Follow up Note  Name: Jessica Stout   Date:   09/01/2022 MRN:  [id number] DOB: 1951/04/15    This 71 y.o. 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  REFERRING PROVIDER: Albina Billet, MD  HPI: Patient is a 71 year old female now out for months having completed SBRT to her left lower lobe for a stage I non-small cell lung cancer.  Seen today in routine follow-up she is doing well.  She specifically denies increased cough hemoptysis chest tightness or any change in her pulmonary status.  She had a recent CT scan.  Showing partial regression of the nodule in the superior segment of the left lower lobe suggesting positive response to therapy.  Does have a new 4 mm left upper lobe pulmonary nodule which attention to follow-up is recommended.  COMPLICATIONS OF TREATMENT: none  FOLLOW UP COMPLIANCE: keeps appointments   PHYSICAL EXAM:  BP 123/65 (BP Location: Right Arm, Patient Position: Sitting, Cuff Size: Normal)   Pulse 91   Temp 97.6 F (36.4 C) (Tympanic)   Resp 16   Wt 139 lb 6.4 oz (63.2 kg)   SpO2 96%   BMI 23.93 kg/m  Well-developed well-nourished patient in NAD. HEENT reveals PERLA, EOMI, discs not visualized.  Oral cavity is clear. No oral mucosal lesions are identified. Neck is clear without evidence of cervical or supraclavicular adenopathy. Lungs are clear to A&P. Cardiac examination is essentially unremarkable with regular rate and rhythm without murmur rub or thrill. Abdomen is benign with no organomegaly or masses noted. Motor sensory and DTR levels are equal and symmetric in the upper and lower extremities. Cranial nerves II through XII are grossly intact. Proprioception is intact. No peripheral adenopathy or edema is identified. No motor or sensory levels are noted. Crude visual fields are within normal range.  RADIOLOGY RESULTS: CT scans  reviewed compatible with above-stated findings  PLAN: Present time patient is doing well with excellent response by CT criteria.  On pleased with her overall progress.  Of asked to see her back in 6 months with a follow-up CT scan at that time.  Patient knows to call in the meantime with any concerns.  I would like to take this opportunity to thank you for allowing me to participate in the care of your patient.Noreene Filbert, MD

## 2022-09-02 ENCOUNTER — Ambulatory Visit: Payer: Medicare Other

## 2022-09-02 ENCOUNTER — Other Ambulatory Visit: Payer: Medicare Other

## 2022-09-02 ENCOUNTER — Ambulatory Visit: Payer: Medicare Other | Admitting: Oncology

## 2022-09-05 NOTE — Progress Notes (Deleted)
[place]  Telephone:([phone]) [phone] Fax:(336) [phone]  ID: Jessica Stout OB: 02/03/1951  MR#: [id number]  UVO#:[id number]  Patient Care Team: Albina Billet, MD as PCP - General (Internal Medicine) Clent Jacks, RN as Oncology Nurse Navigator  CHIEF COMPLAINT: Stage IIIc colon cancer, stage I squamous cell carcinoma of the lung.  INTERVAL HISTORY: Patient returns to clinic today for further evaluation and consideration of cycle 5 of 12 of adjuvant FOLFOX.  She has increased fatigue, but otherwise is tolerating her treatments well.  She has no neurologic complaints.  She denies any recent fevers.  She has a fair appetite, but denies weight loss.  She has no chest pain, shortness of breath, cough, or hemoptysis.  She denies any nausea, vomiting, constipation, or diarrhea.  She has no melena or hematochezia.  Patient is no further specific complaints today.  REVIEW OF SYSTEMS:   Review of Systems  Constitutional:  Positive for malaise/fatigue. Negative for fever and weight loss.  Respiratory: Negative.  Negative for cough, hemoptysis and shortness of breath.   Cardiovascular: Negative.  Negative for chest pain and leg swelling.  Gastrointestinal: Negative.  Negative for abdominal pain, blood in stool, constipation, diarrhea, melena, nausea and vomiting.  Genitourinary: Negative.  Negative for dysuria.  Musculoskeletal: Negative.  Negative for back pain.  Skin: Negative.  Negative for rash.  Neurological: Negative.  Negative for dizziness, focal weakness, weakness and headaches.  Psychiatric/Behavioral: Negative.  The patient is not nervous/anxious.     As per HPI. Otherwise, a complete review of systems is negative.  PAST MEDICAL HISTORY: Past Medical History:  Diagnosis Date   Anemia    Anxiety    Asthma    Cancer ([place])    colon   CHF (congestive heart failure) ([facility])    COPD (chronic obstructive pulmonary disease) ([place])    Diabetes mellitus without  complication ([place])    Hyperlipidemia    Hypertension    Stroke ([place])     PAST SURGICAL HISTORY: Past Surgical History:  Procedure Laterality Date   COLONOSCOPY WITH PROPOFOL N/A 02/03/2022   Procedure: COLONOSCOPY WITH PROPOFOL;  Surgeon: Lin Landsman, MD;  Location: [facility] ENDOSCOPY;  Service: Gastroenterology;  Laterality: N/A;   ESOPHAGOGASTRODUODENOSCOPY (EGD) WITH PROPOFOL N/A 02/03/2022   Procedure: ESOPHAGOGASTRODUODENOSCOPY (EGD) WITH PROPOFOL;  Surgeon: Lin Landsman, MD;  Location: [facility] ENDOSCOPY;  Service: Gastroenterology;  Laterality: N/A;   GIVENS CAPSULE STUDY N/A 02/03/2022   Procedure: GIVENS CAPSULE STUDY;  Surgeon: Lin Landsman, MD;  Location: [facility] ENDOSCOPY;  Service: Gastroenterology;  Laterality: N/A;  possible Capsule Study   IR IMAGING GUIDED PORT INSERTION  06/18/2022    FAMILY HISTORY: No family history on file.  ADVANCED DIRECTIVES (Y/N):  N  HEALTH MAINTENANCE: Social History   Tobacco Use   Smoking status: Every Day    Types: E-cigarettes  Substance Use Topics   Alcohol use: Yes    Comment: rare     Colonoscopy:  PAP:  Bone density:  Lipid panel:  Allergies  Allergen Reactions   Codeine Rash    Happened in 1970s    Current Outpatient Medications  Medication Sig Dispense Refill   acetaminophen (TYLENOL) 325 MG tablet Take 650 mg by mouth every 6 (six) hours as needed.     albuterol (VENTOLIN HFA) 108 (90 Base) MCG/ACT inhaler Inhale into the lungs.     atorvastatin (LIPITOR) 40 MG tablet Take 40 mg by mouth daily.     furosemide (LASIX) 40 MG tablet Take  40 mg by mouth daily.     gabapentin (NEURONTIN) 600 MG tablet Take by mouth 2 (two) times daily.     insulin lispro (HUMALOG) 100 UNIT/ML injection Inject 1-10 Units into the skin 3 (three) times daily before meals. Per sliding scale     LANTUS SOLOSTAR 100 UNIT/ML Solostar Pen Inject 10 Units into the skin at bedtime.     metoprolol succinate (TOPROL-XL) 25 MG 24 hr tablet  Take 1 tablet (25 mg total) by mouth daily. 30 tablet 0   oxyCODONE (OXY IR/ROXICODONE) 5 MG immediate release tablet Take 1 tablet (5 mg total) by mouth every 4 (four) hours as needed for severe pain. 30 tablet 0   pantoprazole (PROTONIX) 40 MG tablet Take 1 tablet (40 mg total) by mouth daily. 30 tablet 5   ranolazine (RANEXA) 500 MG 12 hr tablet Take 500 mg by mouth 2 (two) times daily.     sitaGLIPtin-metformin (JANUMET) 50-1000 MG tablet Take 1 tablet by mouth 2 (two) times daily with a meal.     venlafaxine XR (EFFEXOR-XR) 75 MG 24 hr capsule Take 75 mg by mouth daily.     No current facility-administered medications for this visit.    OBJECTIVE: There were no vitals filed for this visit.     There is no height or weight on file to calculate BMI.    ECOG FS:1 - Symptomatic but completely ambulatory  General: Well-developed, well-nourished, no acute distress. Eyes: Pink conjunctiva, anicteric sclera. HEENT: Normocephalic, moist mucous membranes. Lungs: No audible wheezing or coughing. Heart: Regular rate and rhythm. Abdomen: Soft, nontender, no obvious distention. Musculoskeletal: No edema, cyanosis, or clubbing. Neuro: Alert, answering all questions appropriately. Cranial nerves grossly intact. Skin: No rashes or petechiae noted. Psych: Normal affect.  LAB RESULTS:  Lab Results  Component Value Date   NA 138 08/26/2022   K 3.8 08/26/2022   CL 102 08/26/2022   CO2 29 08/26/2022   GLUCOSE 228 (H) 08/26/2022   BUN 24 (H) 08/26/2022   CREATININE 0.82 08/26/2022   CALCIUM 9.0 08/26/2022   PROT 6.5 08/26/2022   ALBUMIN 3.2 (L) 08/26/2022   AST 17 08/26/2022   ALT 12 08/26/2022   ALKPHOS 59 08/26/2022   BILITOT 0.4 08/26/2022   GFRNONAA >60 08/26/2022    Lab Results  Component Value Date   WBC 3.1 (L) 08/26/2022   NEUTROABS 1.5 (L) 08/26/2022   HGB 9.4 (L) 08/26/2022   HCT 28.8 (L) 08/26/2022   MCV 89.4 08/26/2022   PLT 139 (L) 08/26/2022     STUDIES: CT  Chest W Contrast  Result Date: 09/01/2022 CLINICAL DATA:  71 year old female with history of non-small cell lung cancer. Restaging examination. * Tracking Code: BO * EXAM: CT CHEST WITH CONTRAST TECHNIQUE: Multidetector CT imaging of the chest was performed during intravenous contrast administration. RADIATION DOSE REDUCTION: This exam was performed according to the departmental dose-optimization program which includes automated exposure control, adjustment of the mA and/or kV according to patient size and/or use of iterative reconstruction technique. CONTRAST:  44mL OMNIPAQUE IOHEXOL 300 MG/ML  SOLN COMPARISON:  PET-CT 02/27/2022.  Chest CT 02/03/2022. FINDINGS: Cardiovascular: Heart size is normal. There is no significant pericardial fluid, thickening or pericardial calcification. Right internal jugular single-lumen Port-A-Cath with tip terminating in the right atrium. Atherosclerotic calcifications are noted in the thoracic aorta as well as the left main, left anterior descending, left circumflex and right coronary arteries. Mediastinum/Nodes: No pathologically enlarged mediastinal or hilar lymph nodes. Esophagus is unremarkable in  appearance. No axillary lymphadenopathy. Lungs/Pleura: Previously noted nodule in the superior segment of the left lower lobe has partially involuted, currently measuring 1.3 x 0.5 cm (axial image 61 of series 3). New nodule in the posterior aspect of the left upper lobe (axial image 47 of series 3) measuring only 4 mm (nonspecific). Linear scarring in the inferior segment of the lingula. No other larger more suspicious appearing pulmonary nodules or masses are noted. No acute consolidative airspace disease. No pleural effusions. Diffuse bronchial wall thickening with mild to moderate centrilobular and paraseptal emphysema. Upper Abdomen: Aortic atherosclerosis. 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 Musculoskeletal: There are no aggressive appearing lytic or blastic lesions noted in the visualized portions of the skeleton. IMPRESSION: 1. Partial regression of nodule in the superior segment of the left lower lobe, suggesting a positive response to therapy. 2. New 4 mm left upper lobe pulmonary nodule, nonspecific. Close attention on follow-up studies is recommended. 3. Diffuse bronchial wall thickening with mild to moderate centrilobular and paraseptal emphysema; imaging findings suggestive of underlying COPD. 4. Aortic atherosclerosis, in addition to left main and three-vessel coronary artery disease. Assessment for potential risk factor modification, dietary therapy or pharmacologic therapy may be warranted, if clinically indicated. Aortic Atherosclerosis (ICD10-I70.0) and Emphysema (ICD10-J43.9). Electronically Signed   By: Vinnie Langton M.D.   On: 09/01/2022 09:42    ASSESSMENT: Stage IIIc colon cancer.  PLAN:    Stage IIIc colon cancer: Patient underwent complete surgical resection at [facility] on May 23, 2022.  Large colon mass was noted to partially invade stomach as well as surrounding mesentery.  PET scan did not reveal any metastatic disease other than a positive lung nodule which was confirmed to be a second primary.  Patient's preop CEA was 171.  Patient will require adjuvant chemotherapy using FOLFOX every 2 weeks x 12.  Patient has now had port placement.  Proceed with cycle 5 of treatment today.  Return to clinic in 2 days for pump removal and then in 2 weeks for further evaluation and consideration of cycle 6.   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  Patient completed XRT in May 2023.  Continue monitoring with CT scan every 6 months.   Pain: Resolved. Anemia: Hemoglobin has trended down slightly  to 9.4. Thrombocytopenia: Mild, monitor. Hyperglycemia: Patient's blood glucose is 228, monitor. Hypokalemia: Resolved.  Continue dietary changes as recommended.   Patient expressed understanding and was in agreement with this plan. She also understands that She can call clinic at any time with any questions, concerns, or complaints.    Cancer Staging  Colon cancer [facility]) Staging form: Colon and Rectum, AJCC 8th Edition - Pathologic stage from 06/10/2022: Stage IIIC (pT4b, pN1c, cM0) - Signed by Lloyd Huger, MD on 06/10/2022 Stage prefix: Initial diagnosis Total positive nodes: 0 Histologic grading system: 4 grade system Histologic grade (G): G3   Lloyd Huger, MD   09/05/2022 7:11 AM

## 2022-09-08 ENCOUNTER — Telehealth: Payer: Self-pay | Admitting: Oncology

## 2022-09-08 ENCOUNTER — Other Ambulatory Visit: Payer: Self-pay | Admitting: Oncology

## 2022-09-08 MED FILL — Dexamethasone Sodium Phosphate Inj 100 MG/10ML: INTRAMUSCULAR | Qty: 1 | Status: AC

## 2022-09-08 NOTE — Telephone Encounter (Signed)
Pt husbands called and stated that she is not feeling well and wanted to skip tomorrows tx. They would like to push it out a week. She is scheduled for her next tx 10/24. Please advise.

## 2022-09-09 ENCOUNTER — Inpatient Hospital Stay: Payer: Medicare Other

## 2022-09-09 ENCOUNTER — Other Ambulatory Visit: Payer: Self-pay | Admitting: Oncology

## 2022-09-09 ENCOUNTER — Inpatient Hospital Stay: Payer: Medicare Other | Admitting: Oncology

## 2022-09-09 ENCOUNTER — Encounter: Payer: Self-pay | Admitting: Oncology

## 2022-09-09 DIAGNOSIS — C184 Malignant neoplasm of transverse colon: Secondary | ICD-10-CM

## 2022-09-09 NOTE — Telephone Encounter (Signed)
Called husband to see if she wanted to come in for IVFs. He said that she has been sleeping on and off today and he really does not feel like she will want to come in. I told him that if she would like to come in the next couple days that we can get her scheduled and it may help her feel better. He stated he would pass the message to her and call us if they want an appointment scheduled.

## 2022-09-11 ENCOUNTER — Other Ambulatory Visit: Payer: Self-pay | Admitting: Oncology

## 2022-09-11 NOTE — Progress Notes (Signed)
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  INTERVAL HISTORY: Patient returns to clinic today for further evaluation and consideration of cycle 6 of 12 of adjuvant FOLFOX.  She skipped treatment last week secondary to a mild viral illness and extreme fatigue.  She also had a decreased appetite, but states this is improving.  She has no neurologic complaints.  She denies any recent fevers.  She has no chest pain, shortness of breath, cough, or hemoptysis.  She denies any nausea, vomiting, constipation, or diarrhea.  She has no melena or hematochezia.  Patient offers no further specific complaints today.  REVIEW OF SYSTEMS:   Review of Systems  Constitutional:  Positive for malaise/fatigue. Negative for fever and weight loss.  Respiratory: Negative.  Negative for cough, hemoptysis and shortness of breath.   Cardiovascular: Negative.  Negative for chest pain and leg swelling.  Gastrointestinal: Negative.  Negative for abdominal pain, blood in stool, constipation, diarrhea, melena, nausea and vomiting.  Genitourinary: Negative.  Negative for dysuria.  Musculoskeletal: Negative.  Negative for back pain.  Skin: Negative.  Negative for rash.  Neurological:  Positive for weakness. Negative for dizziness, focal weakness and headaches.  Psychiatric/Behavioral: Negative.  The patient is not nervous/anxious.     As per HPI. Otherwise, a complete review of systems is negative.  PAST MEDICAL HISTORY: Past Medical History:  Diagnosis Date   Anemia    Anxiety    Asthma    Cancer ([place])    colon   CHF (congestive heart failure) (HCC)    COPD (chronic obstructive pulmonary disease) ([place])     Diabetes mellitus without complication ([place])    Hyperlipidemia    Hypertension    Stroke ([place])     PAST SURGICAL HISTORY: Past Surgical History:  Procedure Laterality Date   COLONOSCOPY WITH PROPOFOL N/A 02/03/2022   Procedure: COLONOSCOPY WITH PROPOFOL;  Surgeon: Lin Landsman, MD;  Location: [facility] ENDOSCOPY;  Service: Gastroenterology;  Laterality: N/A;   ESOPHAGOGASTRODUODENOSCOPY (EGD) WITH PROPOFOL N/A 02/03/2022   Procedure: ESOPHAGOGASTRODUODENOSCOPY (EGD) WITH PROPOFOL;  Surgeon: Lin Landsman, MD;  Location: [facility] ENDOSCOPY;  Service: Gastroenterology;  Laterality: N/A;   GIVENS CAPSULE STUDY N/A 02/03/2022   Procedure: GIVENS CAPSULE STUDY;  Surgeon: Lin Landsman, MD;  Location: [facility] ENDOSCOPY;  Service: Gastroenterology;  Laterality: N/A;  possible Capsule Study   IR IMAGING GUIDED PORT INSERTION  06/18/2022    FAMILY HISTORY: History reviewed. No pertinent family history.  ADVANCED DIRECTIVES (Y/N):  N  HEALTH MAINTENANCE: Social History   Tobacco Use   Smoking status: Every Day    Types: E-cigarettes  Substance Use Topics   Alcohol use: Yes    Comment: rare     Colonoscopy:  PAP:  Bone density:  Lipid panel:  Allergies  Allergen Reactions   Codeine Rash    Happened in 1970s    Current Outpatient Medications  Medication Sig Dispense Refill   acetaminophen (TYLENOL) 325 MG tablet Take 650 mg by mouth every 6 (six) hours as needed.     albuterol (VENTOLIN HFA) 108 (90 Base) MCG/ACT inhaler Inhale into the lungs.     atorvastatin (LIPITOR) 40 MG tablet Take 40 mg by mouth daily.  furosemide (LASIX) 40 MG tablet Take 40 mg by mouth daily.     gabapentin (NEURONTIN) 600 MG tablet Take by mouth 2 (two) times daily.     insulin lispro (HUMALOG) 100 UNIT/ML injection Inject 1-10 Units into the skin 3 (three) times daily before meals. Per sliding scale     LANTUS SOLOSTAR 100 UNIT/ML Solostar Pen Inject 10 Units into the skin at bedtime.      oxyCODONE (OXY IR/ROXICODONE) 5 MG immediate release tablet Take 1 tablet (5 mg total) by mouth every 4 (four) hours as needed for severe pain. 30 tablet 0   ranolazine (RANEXA) 500 MG 12 hr tablet Take 500 mg by mouth 2 (two) times daily.     sitaGLIPtin-metformin (JANUMET) 50-1000 MG tablet Take 1 tablet by mouth 2 (two) times daily with a meal.     venlafaxine XR (EFFEXOR-XR) 75 MG 24 hr capsule Take 75 mg by mouth daily.     metoprolol succinate (TOPROL-XL) 25 MG 24 hr tablet Take 1 tablet (25 mg total) by mouth daily. 30 tablet 0   pantoprazole (PROTONIX) 40 MG tablet Take 1 tablet (40 mg total) by mouth daily. 30 tablet 5   No current facility-administered medications for this visit.   Facility-Administered Medications Ordered in Other Visits  Medication Dose Route Frequency Provider Last Rate Last Admin   sodium chloride flush (NS) 0.9 % injection 10 mL  10 mL Intravenous PRN Lloyd Huger, MD   10 mL at 09/16/22 0844    OBJECTIVE: Vitals:   09/16/22 0854  BP: 131/66  Pulse: 80  Temp: (!) 97.3 F (36.3 C)      Body mass index is 23.86 kg/m.    ECOG FS:1 - Symptomatic but completely ambulatory  General: Well-developed, well-nourished, no acute distress. Eyes: Pink conjunctiva, anicteric sclera. HEENT: Normocephalic, moist mucous membranes. Lungs: No audible wheezing or coughing. Heart: Regular rate and rhythm. Abdomen: Soft, nontender, no obvious distention. Musculoskeletal: No edema, cyanosis, or clubbing. Neuro: Alert, answering all questions appropriately. Cranial nerves grossly intact. Skin: No rashes or petechiae noted. Psych: Normal affect.  LAB RESULTS:  Lab Results  Component Value Date   NA 139 09/16/2022   K 3.8 09/16/2022   CL 103 09/16/2022   CO2 26 09/16/2022   GLUCOSE 332 (H) 09/16/2022   BUN 19 09/16/2022   CREATININE 0.70 09/16/2022   CALCIUM 9.1 09/16/2022   PROT 7.1 09/16/2022   ALBUMIN 2.9 (L) 09/16/2022   AST 18 09/16/2022   ALT 12  09/16/2022   ALKPHOS 79 09/16/2022   BILITOT 0.6 09/16/2022   GFRNONAA >60 09/16/2022    Lab Results  Component Value Date   WBC 10.5 09/16/2022   NEUTROABS 7.2 09/16/2022   HGB 9.1 (L) 09/16/2022   HCT 27.9 (L) 09/16/2022   MCV 88.9 09/16/2022   PLT 227 09/16/2022     STUDIES: CT Chest W Contrast  Result Date: 09/01/2022 CLINICAL DATA:  71 year old female with history of non-small cell lung cancer. Restaging examination. * Tracking Code: BO * EXAM: CT CHEST WITH CONTRAST TECHNIQUE: Multidetector CT imaging of the chest was performed during intravenous contrast administration. RADIATION DOSE REDUCTION: This exam was performed according to the departmental dose-optimization program which includes automated exposure control, adjustment of the mA and/or kV according to patient size and/or use of iterative reconstruction technique. CONTRAST:  53mL OMNIPAQUE IOHEXOL 300 MG/ML  SOLN COMPARISON:  PET-CT 02/27/2022.  Chest CT 02/03/2022. FINDINGS: Cardiovascular: Heart size is normal. There is no significant pericardial  fluid, thickening or pericardial calcification. Right internal jugular single-lumen Port-A-Cath with tip terminating in the right atrium. Atherosclerotic calcifications are noted in the thoracic aorta as well as the left main, left anterior descending, left circumflex and right coronary arteries. Mediastinum/Nodes: No pathologically enlarged mediastinal or hilar lymph nodes. Esophagus is unremarkable in appearance. No axillary lymphadenopathy. Lungs/Pleura: Previously noted nodule in the superior segment of the left lower lobe has partially involuted, currently measuring 1.3 x 0.5 cm (axial image 61 of series 3). New nodule in the posterior aspect of the left upper lobe (axial image 47 of series 3) measuring only 4 mm (nonspecific). Linear scarring in the inferior segment of the lingula. No other larger more suspicious appearing pulmonary nodules or masses are noted. No acute  consolidative airspace disease. No pleural effusions. Diffuse bronchial wall thickening with mild to moderate centrilobular and paraseptal emphysema. Upper Abdomen: Aortic atherosclerosis. 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 Musculoskeletal: There are no aggressive appearing lytic or blastic lesions noted in the visualized portions of the skeleton. IMPRESSION: 1. Partial regression of nodule in the superior segment of the left lower lobe, suggesting a positive response to therapy. 2. New 4 mm left upper lobe pulmonary nodule, nonspecific. Close attention on follow-up studies is recommended. 3. Diffuse bronchial wall thickening with mild to moderate centrilobular and paraseptal emphysema; imaging findings suggestive of underlying COPD. 4. Aortic atherosclerosis, in addition to left main and three-vessel coronary artery disease. Assessment for potential risk factor modification, dietary therapy or pharmacologic therapy may be warranted, if clinically indicated. Aortic Atherosclerosis (ICD10-I70.0) and Emphysema (ICD10-J43.9). Electronically Signed   By: Vinnie Langton M.D.   On: 09/01/2022 09:42    ASSESSMENT: Stage IIIc colon cancer.  PLAN:    Stage IIIc colon cancer: Patient underwent complete surgical resection at [facility] on May 23, 2022.  Large colon mass was noted to partially invade stomach as well as surrounding mesentery.  PET scan did not reveal any metastatic disease other than a positive lung nodule which was confirmed to be a second primary.  Patient's preop CEA was 171.  Patient will require adjuvant chemotherapy using FOLFOX every 2 weeks x 12.  Patient has now had port placement.  Proceed with cycle 6 of treatment today.  Return to clinic in 2 days for pump removal in 2 weeks for further  evaluation and consideration of cycle 7.    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  Patient completed XRT in May 2023.  CT scan results from September 01, 2022 reviewed independently and reported as above with partial regression of nodule indicating treatment effect.  Repeat CT scan of chest in approximately April 2024. Pain: Resolved. Anemia: Hemoglobin continues to trend down and is now 9.1. Thrombocytopenia: Mild, monitor.   Hyperglycemia: Patient continues to have poor blood glucose control.   Hypokalemia: Resolved.  Continue dietary changes as recommended. Neutropenia: Patient's ANC is 1.5 today.  Proceed with treatment as above.  May need to consider Udenyca with subsequent treatments.   Patient expressed understanding and was in agreement with this plan. She also understands that She can call clinic at any time with any questions, concerns, or complaints.    Cancer Staging  Colon cancer [facility]) Staging form: Colon and Rectum,  AJCC 8th Edition - Pathologic stage from 06/10/2022: Stage IIIC (pT4b, pN1c, cM0) - Signed by Lloyd Huger, MD on 06/10/2022 Stage prefix: Initial diagnosis Total positive nodes: 0 Histologic grading system: 4 grade system Histologic grade (G): G3   Lloyd Huger, MD   09/16/2022 9:21 AM

## 2022-09-12 ENCOUNTER — Other Ambulatory Visit: Payer: Self-pay | Admitting: Family

## 2022-09-15 MED FILL — Dexamethasone Sodium Phosphate Inj 100 MG/10ML: INTRAMUSCULAR | Qty: 1 | Status: AC

## 2022-09-16 ENCOUNTER — Ambulatory Visit: Payer: Medicare Other | Admitting: Oncology

## 2022-09-16 ENCOUNTER — Inpatient Hospital Stay: Payer: Medicare Other

## 2022-09-16 ENCOUNTER — Other Ambulatory Visit: Payer: Medicare Other

## 2022-09-16 ENCOUNTER — Inpatient Hospital Stay: Payer: Medicare Other | Attending: Oncology

## 2022-09-16 ENCOUNTER — Inpatient Hospital Stay (HOSPITAL_BASED_OUTPATIENT_CLINIC_OR_DEPARTMENT_OTHER): Payer: Medicare Other | Admitting: Oncology

## 2022-09-16 ENCOUNTER — Encounter: Payer: Self-pay | Admitting: Oncology

## 2022-09-16 ENCOUNTER — Ambulatory Visit: Payer: Medicare Other

## 2022-09-16 VITALS — BP 131/66 | HR 80 | Temp 97.3°F | Ht 64.0 in | Wt 139.0 lb

## 2022-09-16 DIAGNOSIS — Z5111 Encounter for antineoplastic chemotherapy: Secondary | ICD-10-CM | POA: Diagnosis present

## 2022-09-16 DIAGNOSIS — Z923 Personal history of irradiation: Secondary | ICD-10-CM | POA: Insufficient documentation

## 2022-09-16 DIAGNOSIS — F17290 Nicotine dependence, other tobacco product, uncomplicated: Secondary | ICD-10-CM | POA: Diagnosis not present

## 2022-09-16 DIAGNOSIS — D709 Neutropenia, unspecified: Secondary | ICD-10-CM | POA: Insufficient documentation

## 2022-09-16 DIAGNOSIS — C3432 Malignant neoplasm of lower lobe, left bronchus or lung: Secondary | ICD-10-CM | POA: Insufficient documentation

## 2022-09-16 DIAGNOSIS — E1165 Type 2 diabetes mellitus with hyperglycemia: Secondary | ICD-10-CM | POA: Diagnosis not present

## 2022-09-16 DIAGNOSIS — C184 Malignant neoplasm of transverse colon: Secondary | ICD-10-CM

## 2022-09-16 DIAGNOSIS — I110 Hypertensive heart disease with heart failure: Secondary | ICD-10-CM | POA: Diagnosis not present

## 2022-09-16 DIAGNOSIS — C189 Malignant neoplasm of colon, unspecified: Secondary | ICD-10-CM | POA: Diagnosis present

## 2022-09-16 DIAGNOSIS — I509 Heart failure, unspecified: Secondary | ICD-10-CM | POA: Insufficient documentation

## 2022-09-16 DIAGNOSIS — D649 Anemia, unspecified: Secondary | ICD-10-CM | POA: Insufficient documentation

## 2022-09-16 DIAGNOSIS — D696 Thrombocytopenia, unspecified: Secondary | ICD-10-CM | POA: Diagnosis not present

## 2022-09-16 LAB — COMPREHENSIVE METABOLIC PANEL
ALT: 12 U/L (ref 0–44)
AST: 18 U/L (ref 15–41)
Albumin: 2.9 g/dL — ABNORMAL LOW (ref 3.5–5.0)
Alkaline Phosphatase: 79 U/L (ref 38–126)
Anion gap: 10 (ref 5–15)
BUN: 19 mg/dL (ref 8–23)
CO2: 26 mmol/L (ref 22–32)
Calcium: 9.1 mg/dL (ref 8.9–10.3)
Chloride: 103 mmol/L (ref 98–111)
Creatinine, Ser: 0.7 mg/dL (ref 0.44–1.00)
GFR, Estimated: >60 mL/min (ref >60)
Glucose, Bld: 332 mg/dL — ABNORMAL HIGH (ref 70–99)
Potassium: 3.8 mmol/L (ref 3.5–5.1)
Sodium: 139 mmol/L (ref 135–145)
Total Bilirubin: 0.6 mg/dL (ref 0.3–1.2)
Total Protein: 7.1 g/dL (ref 6.5–8.1)

## 2022-09-16 LAB — CBC WITH DIFFERENTIAL/PLATELET
Abs Immature Granulocytes: 0.43 10*3/uL — ABNORMAL HIGH (ref 0.00–0.07)
Basophils Absolute: 0.1 10*3/uL (ref 0.0–0.1)
Basophils Relative: 1 %
Eosinophils Absolute: 0 10*3/uL (ref 0.0–0.5)
Eosinophils Relative: 0 %
HCT: 27.9 % — ABNORMAL LOW (ref 36.0–46.0)
Hemoglobin: 9.1 g/dL — ABNORMAL LOW (ref 12.0–15.0)
Immature Granulocytes: 4 %
Lymphocytes Relative: 18 %
Lymphs Abs: 1.9 10*3/uL (ref 0.7–4.0)
MCH: 29 pg (ref 26.0–34.0)
MCHC: 32.6 g/dL (ref 30.0–36.0)
MCV: 88.9 fL (ref 80.0–100.0)
Monocytes Absolute: 1 10*3/uL (ref 0.1–1.0)
Monocytes Relative: 9 %
Neutro Abs: 7.2 10*3/uL (ref 1.7–7.7)
Neutrophils Relative %: 68 %
Platelets: 227 10*3/uL (ref 150–400)
RBC: 3.14 MIL/uL — ABNORMAL LOW (ref 3.87–5.11)
RDW: 20.1 % — ABNORMAL HIGH (ref 11.5–15.5)
WBC: 10.5 10*3/uL (ref 4.0–10.5)
nRBC: 0 % (ref 0.0–0.2)

## 2022-09-16 MED ORDER — FLUOROURACIL CHEMO INJECTION 2.5 GM/50ML
400.0000 mg/m2 | Freq: Once | INTRAVENOUS | Status: AC
  Administered 2022-09-16: 700 mg via INTRAVENOUS
  Filled 2022-09-16: qty 14

## 2022-09-16 MED ORDER — LEUCOVORIN CALCIUM INJECTION 350 MG
700.0000 mg | Freq: Once | INTRAVENOUS | Status: AC
  Administered 2022-09-16: 700 mg via INTRAVENOUS
  Filled 2022-09-16: qty 35

## 2022-09-16 MED ORDER — DEXTROSE 5 % IV SOLN
Freq: Once | INTRAVENOUS | Status: AC
  Filled 2022-09-16: qty 250

## 2022-09-16 MED ORDER — PALONOSETRON HCL INJECTION 0.25 MG/5ML
0.2500 mg | Freq: Once | INTRAVENOUS | Status: AC
  Administered 2022-09-16: 0.25 mg via INTRAVENOUS
  Filled 2022-09-16: qty 5

## 2022-09-16 MED ORDER — SODIUM CHLORIDE 0.9 % IV SOLN
2400.0000 mg/m2 | INTRAVENOUS | Status: DC
  Administered 2022-09-16: 4100 mg via INTRAVENOUS
  Filled 2022-09-16: qty 82

## 2022-09-16 MED ORDER — SODIUM CHLORIDE 0.9% FLUSH
10.0000 mL | INTRAVENOUS | Status: DC | PRN
  Administered 2022-09-16: 10 mL via INTRAVENOUS
  Filled 2022-09-16: qty 10

## 2022-09-16 MED ORDER — SODIUM CHLORIDE 0.9 % IV SOLN
INTRAVENOUS | Status: DC
  Filled 2022-09-16: qty 250

## 2022-09-16 MED ORDER — SODIUM CHLORIDE 0.9 % IV SOLN
10.0000 mg | Freq: Once | INTRAVENOUS | Status: AC
  Administered 2022-09-16: 10 mg via INTRAVENOUS
  Filled 2022-09-16: qty 1
  Filled 2022-09-16: qty 10

## 2022-09-16 MED ORDER — OXALIPLATIN CHEMO INJECTION 100 MG/20ML
85.0000 mg/m2 | Freq: Once | INTRAVENOUS | Status: AC
  Administered 2022-09-16: 145 mg via INTRAVENOUS
  Filled 2022-09-16: qty 20

## 2022-09-16 NOTE — Patient Instructions (Signed)
[facility]  Discharge Instructions: Thank you for choosing [facility] to provide your oncology and hematology care.  If you have a lab appointment with the Cancer Center, please go directly to the Cancer Center and check in at the registration area.  Wear comfortable clothing and clothing appropriate for easy access to any Portacath or PICC line.   We strive to give you quality time with your provider. You may need to reschedule your appointment if you arrive late (15 or more minutes).  Arriving late affects you and other patients whose appointments are after yours.  Also, if you miss three or more appointments without notifying the office, you may be dismissed from the clinic at the provider's discretion.      For prescription refill requests, have your pharmacy contact our office and allow 72 hours for refills to be completed.       To help prevent nausea and vomiting after your treatment, we encourage you to take your nausea medication as directed.  BELOW ARE SYMPTOMS THAT SHOULD BE REPORTED IMMEDIATELY: *FEVER GREATER THAN 100.4 F (38 C) OR HIGHER *CHILLS OR SWEATING *NAUSEA AND VOMITING THAT IS NOT CONTROLLED WITH YOUR NAUSEA MEDICATION *UNUSUAL SHORTNESS OF BREATH *UNUSUAL BRUISING OR BLEEDING *URINARY PROBLEMS (pain or burning when urinating, or frequent urination) *BOWEL PROBLEMS (unusual diarrhea, constipation, pain near the anus) TENDERNESS IN MOUTH AND THROAT WITH OR WITHOUT PRESENCE OF ULCERS (sore throat, sores in mouth, or a toothache) UNUSUAL RASH, SWELLING OR PAIN  UNUSUAL VAGINAL DISCHARGE OR ITCHING   Items with * indicate a potential emergency and should be followed up as soon as possible or go to the Emergency Department if any problems should occur.  Please show the CHEMOTHERAPY ALERT CARD or IMMUNOTHERAPY ALERT CARD at check-in to the Emergency Department and triage nurse.  Should you have questions after your  visit or need to cancel or reschedule your appointment, please contact [facility]  [phone] and follow the prompts.  Office hours are 8:00 a.m. to 4:30 p.m. Monday - Friday. Please note that voicemails left after 4:00 p.m. may not be returned until the following business day.  We are closed weekends and major holidays. You have access to a nurse at all times for urgent questions. Please call the main number to the clinic [phone] and follow the prompts.  For any non-urgent questions, you may also contact your provider using MyChart. We now offer e-Visits for anyone 18 and older to request care online for non-urgent symptoms. For details visit [email or web address].   Also download the MyChart app! Go to the app store, search "MyChart", open the app, select [facility], and log in with your MyChart username and password.  Masks are optional in the cancer centers. If you would like for your care team to wear a mask while they are taking care of you, please let them know. For doctor visits, patients may have with them one support person who is at least 73 years old. At this time, visitors are not allowed in the infusion area.   

## 2022-09-18 ENCOUNTER — Inpatient Hospital Stay: Payer: Medicare Other

## 2022-09-18 VITALS — BP 124/66 | HR 78 | Resp 18

## 2022-09-18 DIAGNOSIS — C184 Malignant neoplasm of transverse colon: Secondary | ICD-10-CM

## 2022-09-18 DIAGNOSIS — Z5111 Encounter for antineoplastic chemotherapy: Secondary | ICD-10-CM | POA: Diagnosis not present

## 2022-09-18 MED ORDER — SODIUM CHLORIDE 0.9% FLUSH
10.0000 mL | INTRAVENOUS | Status: DC | PRN
  Administered 2022-09-18: 10 mL
  Filled 2022-09-18: qty 10

## 2022-09-18 MED ORDER — HEPARIN SOD (PORK) LOCK FLUSH 100 UNIT/ML IV SOLN
500.0000 [IU] | Freq: Once | INTRAVENOUS | Status: AC | PRN
  Administered 2022-09-18: 500 [IU]
  Filled 2022-09-18: qty 5

## 2022-09-23 ENCOUNTER — Ambulatory Visit: Payer: Medicare Other

## 2022-09-23 ENCOUNTER — Other Ambulatory Visit: Payer: Medicare Other

## 2022-09-23 ENCOUNTER — Ambulatory Visit: Payer: Medicare Other | Admitting: Oncology

## 2022-09-29 MED FILL — Dexamethasone Sodium Phosphate Inj 100 MG/10ML: INTRAMUSCULAR | Qty: 1 | Status: AC

## 2022-09-30 ENCOUNTER — Ambulatory Visit: Payer: Medicare Other | Admitting: Oncology

## 2022-09-30 ENCOUNTER — Inpatient Hospital Stay: Payer: Medicare Other

## 2022-09-30 ENCOUNTER — Encounter: Payer: Self-pay | Admitting: Oncology

## 2022-09-30 ENCOUNTER — Ambulatory Visit: Payer: Medicare Other | Admitting: Radiation Oncology

## 2022-09-30 ENCOUNTER — Ambulatory Visit: Payer: Medicare Other

## 2022-09-30 ENCOUNTER — Other Ambulatory Visit: Payer: Self-pay | Admitting: Oncology

## 2022-09-30 ENCOUNTER — Other Ambulatory Visit: Payer: Self-pay

## 2022-09-30 ENCOUNTER — Other Ambulatory Visit: Payer: Medicare Other

## 2022-09-30 ENCOUNTER — Inpatient Hospital Stay (HOSPITAL_BASED_OUTPATIENT_CLINIC_OR_DEPARTMENT_OTHER): Payer: Medicare Other | Admitting: Oncology

## 2022-09-30 VITALS — BP 151/64 | HR 91 | Temp 98.2°F | Resp 18 | Ht 64.0 in | Wt 140.0 lb

## 2022-09-30 DIAGNOSIS — C184 Malignant neoplasm of transverse colon: Secondary | ICD-10-CM

## 2022-09-30 DIAGNOSIS — D649 Anemia, unspecified: Secondary | ICD-10-CM

## 2022-09-30 DIAGNOSIS — Z5111 Encounter for antineoplastic chemotherapy: Secondary | ICD-10-CM | POA: Diagnosis not present

## 2022-09-30 LAB — CBC WITH DIFFERENTIAL/PLATELET
Abs Immature Granulocytes: 0.01 10*3/uL (ref 0.00–0.07)
Basophils Absolute: 0 10*3/uL (ref 0.0–0.1)
Basophils Relative: 0 %
Eosinophils Absolute: 0 10*3/uL (ref 0.0–0.5)
Eosinophils Relative: 1 %
HCT: 23.9 % — ABNORMAL LOW (ref 36.0–46.0)
Hemoglobin: 7.5 g/dL — ABNORMAL LOW (ref 12.0–15.0)
Immature Granulocytes: 0 %
Lymphocytes Relative: 27 %
Lymphs Abs: 0.8 10*3/uL (ref 0.7–4.0)
MCH: 29.2 pg (ref 26.0–34.0)
MCHC: 31.4 g/dL (ref 30.0–36.0)
MCV: 93 fL (ref 80.0–100.0)
Monocytes Absolute: 0.5 10*3/uL (ref 0.1–1.0)
Monocytes Relative: 16 %
Neutro Abs: 1.6 10*3/uL — ABNORMAL LOW (ref 1.7–7.7)
Neutrophils Relative %: 56 %
Platelets: 134 10*3/uL — ABNORMAL LOW (ref 150–400)
RBC: 2.57 MIL/uL — ABNORMAL LOW (ref 3.87–5.11)
RDW: 21.3 % — ABNORMAL HIGH (ref 11.5–15.5)
WBC: 2.9 10*3/uL — ABNORMAL LOW (ref 4.0–10.5)
nRBC: 0 % (ref 0.0–0.2)

## 2022-09-30 LAB — COMPREHENSIVE METABOLIC PANEL
ALT: 8 U/L (ref 0–44)
AST: 16 U/L (ref 15–41)
Albumin: 2.8 g/dL — ABNORMAL LOW (ref 3.5–5.0)
Alkaline Phosphatase: 60 U/L (ref 38–126)
Anion gap: 8 (ref 5–15)
BUN: 16 mg/dL (ref 8–23)
CO2: 25 mmol/L (ref 22–32)
Calcium: 8.1 mg/dL — ABNORMAL LOW (ref 8.9–10.3)
Chloride: 105 mmol/L (ref 98–111)
Creatinine, Ser: 0.62 mg/dL (ref 0.44–1.00)
GFR, Estimated: >60 mL/min (ref >60)
Glucose, Bld: 159 mg/dL — ABNORMAL HIGH (ref 70–99)
Potassium: 3.3 mmol/L — ABNORMAL LOW (ref 3.5–5.1)
Sodium: 138 mmol/L (ref 135–145)
Total Bilirubin: 0.4 mg/dL (ref 0.3–1.2)
Total Protein: 6.4 g/dL — ABNORMAL LOW (ref 6.5–8.1)

## 2022-09-30 LAB — PREPARE RBC (CROSSMATCH)

## 2022-09-30 LAB — ABO/RH: ABO/RH(D): A POS

## 2022-09-30 MED ORDER — HEPARIN SOD (PORK) LOCK FLUSH 100 UNIT/ML IV SOLN
500.0000 [IU] | Freq: Once | INTRAVENOUS | Status: AC
  Administered 2022-09-30: 500 [IU] via INTRAVENOUS
  Filled 2022-09-30: qty 5

## 2022-09-30 NOTE — Progress Notes (Unsigned)
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  INTERVAL HISTORY: Patient returns to clinic today for further evaluation and consideration of cycle 7 of 12 of adjuvant FOLFOX.  She has noticed increased weakness and fatigue this week, but otherwise has felt well.  She continues to have a decreased appetite, but states this is improving.  She has no neurologic complaints.  She denies any recent fevers.  She has no chest pain, shortness of breath, cough, or hemoptysis.  She denies any nausea, vomiting, constipation, or diarrhea.  She has no melena or hematochezia.  Patient is no further specific complaints today.  REVIEW OF SYSTEMS:   Review of Systems  Constitutional:  Positive for malaise/fatigue. Negative for fever and weight loss.  Respiratory: Negative.  Negative for cough, hemoptysis and shortness of breath.   Cardiovascular: Negative.  Negative for chest pain and leg swelling.  Gastrointestinal: Negative.  Negative for abdominal pain, blood in stool, constipation, diarrhea, melena, nausea and vomiting.  Genitourinary: Negative.  Negative for dysuria.  Musculoskeletal: Negative.  Negative for back pain.  Skin: Negative.  Negative for rash.  Neurological:  Positive for weakness. Negative for dizziness, focal weakness and headaches.  Psychiatric/Behavioral: Negative.  The patient is not nervous/anxious.     As per HPI. Otherwise, a complete review of systems is negative.  PAST MEDICAL HISTORY: Past Medical History:  Diagnosis Date   Anemia    Anxiety    Asthma    Cancer ([place])    colon   CHF (congestive heart failure) (HCC)    COPD (chronic obstructive pulmonary disease) ([place])     Diabetes mellitus without complication ([place])    Hyperlipidemia    Hypertension    Stroke ([place])     PAST SURGICAL HISTORY: Past Surgical History:  Procedure Laterality Date   COLONOSCOPY WITH PROPOFOL N/A 02/03/2022   Procedure: COLONOSCOPY WITH PROPOFOL;  Surgeon: Lin Landsman, MD;  Location: [facility] ENDOSCOPY;  Service: Gastroenterology;  Laterality: N/A;   ESOPHAGOGASTRODUODENOSCOPY (EGD) WITH PROPOFOL N/A 02/03/2022   Procedure: ESOPHAGOGASTRODUODENOSCOPY (EGD) WITH PROPOFOL;  Surgeon: Lin Landsman, MD;  Location: [facility] ENDOSCOPY;  Service: Gastroenterology;  Laterality: N/A;   GIVENS CAPSULE STUDY N/A 02/03/2022   Procedure: GIVENS CAPSULE STUDY;  Surgeon: Lin Landsman, MD;  Location: [facility] ENDOSCOPY;  Service: Gastroenterology;  Laterality: N/A;  possible Capsule Study   IR IMAGING GUIDED PORT INSERTION  06/18/2022    FAMILY HISTORY: History reviewed. No pertinent family history.  ADVANCED DIRECTIVES (Y/N):  N  HEALTH MAINTENANCE: Social History   Tobacco Use   Smoking status: Every Day    Types: E-cigarettes  Substance Use Topics   Alcohol use: Yes    Comment: rare     Colonoscopy:  PAP:  Bone density:  Lipid panel:  Allergies  Allergen Reactions   Codeine Rash    Happened in 1970s    Current Outpatient Medications  Medication Sig Dispense Refill   acetaminophen (TYLENOL) 325 MG tablet Take 650 mg by mouth every 6 (six) hours as needed.     albuterol (VENTOLIN HFA) 108 (90 Base) MCG/ACT inhaler Inhale into the lungs.     atorvastatin (LIPITOR) 40 MG tablet Take 40 mg by mouth daily.  furosemide (LASIX) 40 MG tablet Take 40 mg by mouth daily.     gabapentin (NEURONTIN) 600 MG tablet Take by mouth 2 (two) times daily.     insulin lispro (HUMALOG) 100 UNIT/ML injection Inject 1-10 Units into the skin 3 (three) times daily before meals. Per sliding scale     LANTUS SOLOSTAR 100 UNIT/ML Solostar Pen Inject 10 Units into the skin at bedtime.      oxyCODONE (OXY IR/ROXICODONE) 5 MG immediate release tablet Take 1 tablet (5 mg total) by mouth every 4 (four) hours as needed for severe pain. 30 tablet 0   ranolazine (RANEXA) 500 MG 12 hr tablet Take 500 mg by mouth 2 (two) times daily.     sitaGLIPtin-metformin (JANUMET) 50-1000 MG tablet Take 1 tablet by mouth 2 (two) times daily with a meal.     venlafaxine XR (EFFEXOR-XR) 75 MG 24 hr capsule Take 75 mg by mouth daily.     metoprolol succinate (TOPROL-XL) 25 MG 24 hr tablet Take 1 tablet (25 mg total) by mouth daily. 30 tablet 0   pantoprazole (PROTONIX) 40 MG tablet Take 1 tablet (40 mg total) by mouth daily. 30 tablet 5   No current facility-administered medications for this visit.    OBJECTIVE: Vitals:   09/30/22 0846  BP: (!) 151/64  Pulse: 91  Resp: 18  Temp: 98.2 F (36.8 C)  SpO2: 94%      Body mass index is 24.03 kg/m.    ECOG FS:1 - Symptomatic but completely ambulatory  General: Well-developed, well-nourished, no acute distress.  Sitting in a wheelchair. Eyes: Pink conjunctiva, anicteric sclera. HEENT: Normocephalic, moist mucous membranes. Lungs: No audible wheezing or coughing. Heart: Regular rate and rhythm. Abdomen: Soft, nontender, no obvious distention. Musculoskeletal: No edema, cyanosis, or clubbing. Neuro: Alert, answering all questions appropriately. Cranial nerves grossly intact. Skin: No rashes or petechiae noted. Psych: Normal affect.  LAB RESULTS:  Lab Results  Component Value Date   NA 138 09/30/2022   K 3.3 (L) 09/30/2022   CL 105 09/30/2022   CO2 25 09/30/2022   GLUCOSE 159 (H) 09/30/2022   BUN 16 09/30/2022   CREATININE 0.62 09/30/2022   CALCIUM 8.1 (L) 09/30/2022   PROT 6.4 (L) 09/30/2022   ALBUMIN 2.8 (L) 09/30/2022   AST 16 09/30/2022   ALT 8 09/30/2022   ALKPHOS 60 09/30/2022   BILITOT 0.4 09/30/2022   GFRNONAA >60 09/30/2022    Lab Results  Component Value Date   WBC 2.9 (L) 09/30/2022   NEUTROABS 1.6 (L) 09/30/2022    HGB 7.5 (L) 09/30/2022   HCT 23.9 (L) 09/30/2022   MCV 93.0 09/30/2022   PLT 134 (L) 09/30/2022     STUDIES: No results found.  ASSESSMENT: Stage IIIc colon cancer.  PLAN:    Stage IIIc colon cancer: Patient underwent complete surgical resection at [facility] on May 23, 2022.  Large colon mass was noted to partially invade stomach as well as surrounding mesentery.  PET scan did not reveal any metastatic disease other than a positive lung nodule which was confirmed to be a second primary.  Patient's preop CEA was 171.  Patient will require adjuvant chemotherapy using FOLFOX every 2 weeks x 12.  Patient has now had port placement.  Delay cycle 7 today secondary to decreased performance status and pancytopenia.  Patient will return to clinic in 2 days to receive 1 unit of packed red blood cell.  She would then return to clinic in 1 week for  further evaluation and reconsideration of cycle 7.   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  Patient completed XRT in May 2023.  CT scan results from September 01, 2022 reviewed independently and reported as above with partial regression of nodule indicating treatment effect.  Repeat CT scan of chest in approximately April 2024. Pain: Resolved. Anemia: Hemoglobin down to 7.5.  Blood transfusion later this week as above.   Thrombocytopenia: Mild.  Delay treatment 1 week as above. Hyperglycemia: Improved.   Hypokalemia: Mild.  Continue dietary changes as recommended. Neutropenia: Patient's ANC is 1.6 today.  Delay treatment 1 week as above.  May need to consider Udenyca with subsequent treatments.   Patient expressed understanding and was in agreement with this plan. She also understands that She can call clinic at any time with any questions, concerns, or complaints.    Cancer Staging  Colon cancer [facility]) Staging form: Colon and Rectum, AJCC 8th Edition - Pathologic stage from  06/10/2022: Stage IIIC (pT4b, pN1c, cM0) - Signed by Lloyd Huger, MD on 06/10/2022 Stage prefix: Initial diagnosis Total positive nodes: 0 Histologic grading system: 4 grade system Histologic grade (G): G3   Lloyd Huger, MD   10/01/2022 1:26 PM

## 2022-10-01 ENCOUNTER — Encounter: Payer: Self-pay | Admitting: Oncology

## 2022-10-01 ENCOUNTER — Other Ambulatory Visit: Payer: Self-pay | Admitting: Oncology

## 2022-10-02 ENCOUNTER — Inpatient Hospital Stay: Payer: Medicare Other | Attending: Oncology

## 2022-10-02 VITALS — BP 137/70 | HR 80 | Temp 99.1°F | Resp 18

## 2022-10-02 DIAGNOSIS — E1165 Type 2 diabetes mellitus with hyperglycemia: Secondary | ICD-10-CM | POA: Insufficient documentation

## 2022-10-02 DIAGNOSIS — Z923 Personal history of irradiation: Secondary | ICD-10-CM | POA: Diagnosis not present

## 2022-10-02 DIAGNOSIS — E876 Hypokalemia: Secondary | ICD-10-CM | POA: Insufficient documentation

## 2022-10-02 DIAGNOSIS — C184 Malignant neoplasm of transverse colon: Secondary | ICD-10-CM

## 2022-10-02 DIAGNOSIS — D649 Anemia, unspecified: Secondary | ICD-10-CM | POA: Diagnosis not present

## 2022-10-02 DIAGNOSIS — I509 Heart failure, unspecified: Secondary | ICD-10-CM | POA: Diagnosis not present

## 2022-10-02 DIAGNOSIS — C189 Malignant neoplasm of colon, unspecified: Secondary | ICD-10-CM | POA: Insufficient documentation

## 2022-10-02 DIAGNOSIS — Z9981 Dependence on supplemental oxygen: Secondary | ICD-10-CM | POA: Insufficient documentation

## 2022-10-02 DIAGNOSIS — Z5111 Encounter for antineoplastic chemotherapy: Secondary | ICD-10-CM | POA: Diagnosis not present

## 2022-10-02 DIAGNOSIS — I110 Hypertensive heart disease with heart failure: Secondary | ICD-10-CM | POA: Insufficient documentation

## 2022-10-02 DIAGNOSIS — R609 Edema, unspecified: Secondary | ICD-10-CM | POA: Insufficient documentation

## 2022-10-02 DIAGNOSIS — F17290 Nicotine dependence, other tobacco product, uncomplicated: Secondary | ICD-10-CM | POA: Insufficient documentation

## 2022-10-02 DIAGNOSIS — C3432 Malignant neoplasm of lower lobe, left bronchus or lung: Secondary | ICD-10-CM | POA: Insufficient documentation

## 2022-10-02 DIAGNOSIS — R0602 Shortness of breath: Secondary | ICD-10-CM | POA: Insufficient documentation

## 2022-10-02 MED ORDER — DIPHENHYDRAMINE HCL 50 MG/ML IJ SOLN
25.0000 mg | Freq: Once | INTRAMUSCULAR | Status: AC
  Administered 2022-10-02: 25 mg via INTRAVENOUS
  Filled 2022-10-02: qty 1

## 2022-10-02 MED ORDER — SODIUM CHLORIDE 0.9% FLUSH
10.0000 mL | INTRAVENOUS | Status: AC | PRN
  Administered 2022-10-02: 10 mL
  Filled 2022-10-02: qty 10

## 2022-10-02 MED ORDER — DIPHENHYDRAMINE HCL 50 MG/ML IJ SOLN: 1.0000 mg | Freq: Once | INTRAMUSCULAR | Status: DC

## 2022-10-02 MED ORDER — ACETAMINOPHEN 325 MG PO TABS: 650.0000 mg | ORAL_TABLET | Freq: Once | ORAL | Status: DC

## 2022-10-02 MED ORDER — HEPARIN SOD (PORK) LOCK FLUSH 100 UNIT/ML IV SOLN
500.0000 [IU] | Freq: Once | INTRAVENOUS | Status: AC | PRN
  Administered 2022-10-02: 500 [IU]
  Filled 2022-10-02: qty 5

## 2022-10-02 MED ORDER — ACETAMINOPHEN 325 MG PO TABS
650.0000 mg | ORAL_TABLET | Freq: Once | ORAL | Status: AC
  Administered 2022-10-02: 650 mg via ORAL
  Filled 2022-10-02: qty 2

## 2022-10-02 MED ORDER — SODIUM CHLORIDE 0.9% IV SOLUTION
250.0000 mL | Freq: Once | INTRAVENOUS | Status: AC
  Administered 2022-10-02: 250 mL via INTRAVENOUS
  Filled 2022-10-02: qty 250

## 2022-10-03 ENCOUNTER — Other Ambulatory Visit: Payer: Self-pay | Admitting: Oncology

## 2022-10-03 LAB — TYPE AND SCREEN
ABO/RH(D): A POS
Antibody Screen: NEGATIVE
Unit division: 0

## 2022-10-03 LAB — BPAM RBC
Blood Product Expiration Date: 202311092359
ISSUE DATE / TIME: 202311020930
Unit Type and Rh: 600

## 2022-10-06 ENCOUNTER — Other Ambulatory Visit: Payer: Self-pay | Admitting: Oncology

## 2022-10-07 ENCOUNTER — Ambulatory Visit: Payer: Medicare Other | Admitting: Oncology

## 2022-10-07 ENCOUNTER — Inpatient Hospital Stay: Payer: Medicare Other

## 2022-10-07 ENCOUNTER — Ambulatory Visit: Payer: Medicare Other

## 2022-10-07 ENCOUNTER — Other Ambulatory Visit: Payer: Medicare Other

## 2022-10-07 ENCOUNTER — Ambulatory Visit
Admission: RE | Admit: 2022-10-07 | Discharge: 2022-10-07 | Disposition: A | Discharge status: Home / Self Care | Payer: Medicare Other | Source: Ambulatory Visit | Attending: Oncology | Admitting: Oncology

## 2022-10-07 ENCOUNTER — Inpatient Hospital Stay (HOSPITAL_BASED_OUTPATIENT_CLINIC_OR_DEPARTMENT_OTHER): Payer: Medicare Other | Admitting: Oncology

## 2022-10-07 ENCOUNTER — Encounter: Payer: Self-pay | Admitting: Oncology

## 2022-10-07 ENCOUNTER — Inpatient Hospital Stay: Payer: Medicare Other | Admitting: Oncology

## 2022-10-07 VITALS — BP 110/61 | HR 89 | Temp 98.7°F | Resp 20 | Ht 64.0 in | Wt 138.0 lb

## 2022-10-07 DIAGNOSIS — C184 Malignant neoplasm of transverse colon: Secondary | ICD-10-CM | POA: Diagnosis not present

## 2022-10-07 DIAGNOSIS — I509 Heart failure, unspecified: Secondary | ICD-10-CM | POA: Diagnosis not present

## 2022-10-07 DIAGNOSIS — R0602 Shortness of breath: Secondary | ICD-10-CM

## 2022-10-07 DIAGNOSIS — Z5111 Encounter for antineoplastic chemotherapy: Secondary | ICD-10-CM | POA: Diagnosis not present

## 2022-10-07 DIAGNOSIS — E876 Hypokalemia: Secondary | ICD-10-CM

## 2022-10-07 DIAGNOSIS — E782 Mixed hyperlipidemia: Secondary | ICD-10-CM | POA: Diagnosis not present

## 2022-10-07 LAB — CBC WITH DIFFERENTIAL/PLATELET
Abs Immature Granulocytes: 0.02 10*3/uL (ref 0.00–0.07)
Basophils Absolute: 0 10*3/uL (ref 0.0–0.1)
Basophils Relative: 1 %
Eosinophils Absolute: 0 10*3/uL (ref 0.0–0.5)
Eosinophils Relative: 1 %
HCT: 31.2 % — ABNORMAL LOW (ref 36.0–46.0)
Hemoglobin: 9.4 g/dL — ABNORMAL LOW (ref 12.0–15.0)
Immature Granulocytes: 0 %
Lymphocytes Relative: 18 %
Lymphs Abs: 0.9 10*3/uL (ref 0.7–4.0)
MCH: 27.1 pg (ref 26.0–34.0)
MCHC: 30.1 g/dL (ref 30.0–36.0)
MCV: 89.9 fL (ref 80.0–100.0)
Monocytes Absolute: 0.6 10*3/uL (ref 0.1–1.0)
Monocytes Relative: 12 %
Neutro Abs: 3.4 10*3/uL (ref 1.7–7.7)
Neutrophils Relative %: 68 %
Platelets: 175 10*3/uL (ref 150–400)
RBC: 3.47 MIL/uL — ABNORMAL LOW (ref 3.87–5.11)
RDW: 19.9 % — ABNORMAL HIGH (ref 11.5–15.5)
WBC: 5 10*3/uL (ref 4.0–10.5)
nRBC: 0 % (ref 0.0–0.2)

## 2022-10-07 LAB — COMPREHENSIVE METABOLIC PANEL
ALT: 8 U/L (ref 0–44)
AST: 14 U/L — ABNORMAL LOW (ref 15–41)
Albumin: 2.8 g/dL — ABNORMAL LOW (ref 3.5–5.0)
Alkaline Phosphatase: 58 U/L (ref 38–126)
Anion gap: 11 (ref 5–15)
BUN: 16 mg/dL (ref 8–23)
CO2: 32 mmol/L (ref 22–32)
Calcium: 7.6 mg/dL — ABNORMAL LOW (ref 8.9–10.3)
Chloride: 97 mmol/L — ABNORMAL LOW (ref 98–111)
Creatinine, Ser: 1.06 mg/dL — ABNORMAL HIGH (ref 0.44–1.00)
GFR, Estimated: 55 mL/min — ABNORMAL LOW (ref >60)
Glucose, Bld: 113 mg/dL — ABNORMAL HIGH (ref 70–99)
Potassium: 2.9 mmol/L — ABNORMAL LOW (ref 3.5–5.1)
Sodium: 140 mmol/L (ref 135–145)
Total Bilirubin: 0.8 mg/dL (ref 0.3–1.2)
Total Protein: 6.7 g/dL (ref 6.5–8.1)

## 2022-10-07 MED ORDER — PALONOSETRON HCL INJECTION 0.25 MG/5ML
0.2500 mg | Freq: Once | INTRAVENOUS | Status: AC
  Administered 2022-10-07: 0.25 mg via INTRAVENOUS
  Filled 2022-10-07: qty 5

## 2022-10-07 MED ORDER — LEUCOVORIN CALCIUM INJECTION 350 MG
409.0000 mg/m2 | Freq: Once | INTRAVENOUS | Status: AC
  Administered 2022-10-07: 700 mg via INTRAVENOUS
  Filled 2022-10-07: qty 35

## 2022-10-07 MED ORDER — POTASSIUM CHLORIDE 20 MEQ/100ML IV SOLN
20.0000 meq | Freq: Once | INTRAVENOUS | Status: AC
  Administered 2022-10-07: 20 meq via INTRAVENOUS

## 2022-10-07 MED ORDER — FLUOROURACIL CHEMO INJECTION 2.5 GM/50ML
400.0000 mg/m2 | Freq: Once | INTRAVENOUS | Status: AC
  Administered 2022-10-07: 700 mg via INTRAVENOUS
  Filled 2022-10-07: qty 14

## 2022-10-07 MED ORDER — DEXTROSE 5 % IV SOLN
Freq: Once | INTRAVENOUS | Status: AC
  Filled 2022-10-07: qty 250

## 2022-10-07 MED ORDER — POTASSIUM CHLORIDE CRYS ER 20 MEQ PO TBCR: 20.0000 meq | EXTENDED_RELEASE_TABLET | Freq: Two times a day (BID) | ORAL | 0 refills | Status: DC

## 2022-10-07 MED ORDER — SODIUM CHLORIDE 0.9 % IV SOLN
2400.0000 mg/m2 | INTRAVENOUS | Status: DC
  Administered 2022-10-07: 4100 mg via INTRAVENOUS
  Filled 2022-10-07: qty 82

## 2022-10-07 MED ORDER — OXALIPLATIN CHEMO INJECTION 100 MG/20ML
85.0000 mg/m2 | Freq: Once | INTRAVENOUS | Status: AC
  Administered 2022-10-07: 145 mg via INTRAVENOUS
  Filled 2022-10-07: qty 20

## 2022-10-07 MED ORDER — SODIUM CHLORIDE 0.9% FLUSH
10.0000 mL | INTRAVENOUS | Status: DC | PRN
  Filled 2022-10-07: qty 10

## 2022-10-07 MED ORDER — SODIUM CHLORIDE 0.9 % IV SOLN
10.0000 mg | Freq: Once | INTRAVENOUS | Status: AC
  Administered 2022-10-07: 10 mg via INTRAVENOUS
  Filled 2022-10-07: qty 10
  Filled 2022-10-07: qty 1

## 2022-10-07 NOTE — Patient Instructions (Signed)
[facility] [place]  Discharge Instructions: Thank you for choosing [facility] to provide your oncology and hematology care.  If you have a lab appointment with the [facility], please go directly to the [facility] and check in at the registration area.  Wear comfortable clothing and clothing appropriate for easy access to any Portacath or PICC line.   We strive to give you quality time with your provider. You may need to reschedule your appointment if you arrive late (15 or more minutes).  Arriving late affects you and other patients whose appointments are after yours.  Also, if you miss three or more appointments without notifying the office, you may be dismissed from the clinic at the provider's discretion.      For prescription refill requests, have your pharmacy contact our office and allow 72 hours for refills to be completed.    Today you received the following chemotherapy and/or immunotherapy agents- Oxaliplatin, leucovorin, 5FU      To help prevent nausea and vomiting after your treatment, we encourage you to take your nausea medication as directed.  BELOW ARE SYMPTOMS THAT SHOULD BE REPORTED IMMEDIATELY: *FEVER GREATER THAN 100.4 F (38 C) OR HIGHER *CHILLS OR SWEATING *NAUSEA AND VOMITING THAT IS NOT CONTROLLED WITH YOUR NAUSEA MEDICATION *UNUSUAL SHORTNESS OF BREATH *UNUSUAL BRUISING OR BLEEDING *URINARY PROBLEMS (pain or burning when urinating, or frequent urination) *BOWEL PROBLEMS (unusual diarrhea, constipation, pain near the anus) TENDERNESS IN MOUTH AND THROAT WITH OR WITHOUT PRESENCE OF ULCERS (sore throat, sores in mouth, or a toothache) UNUSUAL RASH, SWELLING OR PAIN  UNUSUAL VAGINAL DISCHARGE OR ITCHING   Items with * indicate a potential emergency and should be followed up as soon as possible or go to the Emergency Department if any problems should occur.  Please show the CHEMOTHERAPY ALERT CARD or IMMUNOTHERAPY ALERT  CARD at check-in to the Emergency Department and triage nurse.  Should you have questions after your visit or need to cancel or reschedule your appointment, please contact [facility] [place] [facility] [place]  [phone] and follow the prompts.  Office hours are 8:00 a.m. to 4:30 p.m. Monday - Friday. Please note that voicemails left after 4:00 p.m. may not be returned until the following business day.  We are closed weekends and major holidays. You have access to a nurse at all times for urgent questions. Please call the main number to the clinic [phone] and follow the prompts.  For any non-urgent questions, you may also contact your provider using MyChart. We now offer e-Visits for anyone 64 and older to request care online for non-urgent symptoms. For details visit [email or web address].   Also download the MyChart app! Go to the app store, search "MyChart", open the app, select [facility], and log in with your MyChart username and password.  Masks are optional in the cancer centers. If you would like for your care team to wear a mask while they are taking care of you, please let them know. For doctor visits, patients may have with them one support person who is at least 73 years old. At this time, visitors are not allowed in the infusion area.

## 2022-10-07 NOTE — Progress Notes (Signed)
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  INTERVAL HISTORY: Patient returns to clinic today for further evaluation and reconsideration of cycle 7 of 12 of adjuvant FOLFOX.  She continues to have weakness and fatigue.  Over the past several days she has noticed increased peripheral edema and shortness of breath and is now using oxygen again.  She has no neurologic complaints.  She denies any recent fevers.  She has no chest pain, cough, or hemoptysis.  She denies any nausea, vomiting, constipation, or diarrhea.  She has no melena or hematochezia.  Patient offers no further specific complaints today.  REVIEW OF SYSTEMS:   Review of Systems  Constitutional:  Positive for malaise/fatigue. Negative for fever and weight loss.  Respiratory:  Positive for shortness of breath. Negative for cough and hemoptysis.   Cardiovascular:  Positive for leg swelling. Negative for chest pain.  Gastrointestinal: Negative.  Negative for abdominal pain, blood in stool, constipation, diarrhea, melena, nausea and vomiting.  Genitourinary: Negative.  Negative for dysuria.  Musculoskeletal: Negative.  Negative for back pain.  Skin: Negative.  Negative for rash.  Neurological:  Positive for weakness. Negative for dizziness, focal weakness and headaches.  Psychiatric/Behavioral: Negative.  The patient is not nervous/anxious.     As per HPI. Otherwise, a complete review of systems is negative.  PAST MEDICAL HISTORY: Past Medical History:  Diagnosis Date   Anemia    Anxiety    Asthma    Cancer ([place])    colon   CHF (congestive heart failure) ([facility])    COPD (chronic obstructive pulmonary disease) ([place])     Diabetes mellitus without complication ([place])    Hyperlipidemia    Hypertension    Stroke ([place])     PAST SURGICAL HISTORY: Past Surgical History:  Procedure Laterality Date   COLONOSCOPY WITH PROPOFOL N/A 02/03/2022   Procedure: COLONOSCOPY WITH PROPOFOL;  Surgeon: Lin Landsman, MD;  Location: [facility] ENDOSCOPY;  Service: Gastroenterology;  Laterality: N/A;   ESOPHAGOGASTRODUODENOSCOPY (EGD) WITH PROPOFOL N/A 02/03/2022   Procedure: ESOPHAGOGASTRODUODENOSCOPY (EGD) WITH PROPOFOL;  Surgeon: Lin Landsman, MD;  Location[facility] ENDOSCOPY;  Service: Gastroenterology;  Laterality: N/A;   GIVENS CAPSULE STUDY N/A 02/03/2022   Procedure: GIVENS CAPSULE STUDY;  Surgeon: Lin Landsman, MD;  Location: [facility] ENDOSCOPY;  Service: Gastroenterology;  Laterality: N/A;  possible Capsule Study   IR IMAGING GUIDED PORT INSERTION  06/18/2022    FAMILY HISTORY: History reviewed. No pertinent family history.  ADVANCED DIRECTIVES (Y/N):  N  HEALTH MAINTENANCE: Social History   Tobacco Use   Smoking status: Every Day    Types: E-cigarettes  Substance Use Topics   Alcohol use: Yes    Comment: rare     Colonoscopy:  PAP:  Bone density:  Lipid panel:  Allergies  Allergen Reactions   Codeine Rash    Happened in 1970s    Current Outpatient Medications  Medication Sig Dispense Refill   acetaminophen (TYLENOL) 325 MG tablet Take 650 mg by mouth every 6 (six) hours as needed.     albuterol (VENTOLIN HFA) 108 (90 Base) MCG/ACT inhaler Inhale into the lungs.     atorvastatin (LIPITOR) 40 MG tablet Take 40 mg by mouth daily.  fenofibrate 54 MG tablet Take 54 mg by mouth daily.     furosemide (LASIX) 40 MG tablet Take 40 mg by mouth daily.     gabapentin (NEURONTIN) 600 MG tablet Take by mouth 2 (two) times daily.     insulin lispro (HUMALOG) 100 UNIT/ML injection Inject 1-10 Units into the skin 3 (three) times daily before meals. Per sliding scale     LANTUS SOLOSTAR 100 UNIT/ML Solostar  Pen Inject 10 Units into the skin at bedtime.     oxyCODONE (OXY IR/ROXICODONE) 5 MG immediate release tablet Take 1 tablet (5 mg total) by mouth every 4 (four) hours as needed for severe pain. 30 tablet 0   potassium chloride SA (KLOR-CON M) 20 MEQ tablet Take 1 tablet (20 mEq total) by mouth 2 (two) times daily. 60 tablet 0   ranolazine (RANEXA) 500 MG 12 hr tablet Take 500 mg by mouth 2 (two) times daily.     sitaGLIPtin-metformin (JANUMET) 50-1000 MG tablet Take 1 tablet by mouth 2 (two) times daily with a meal.     venlafaxine XR (EFFEXOR-XR) 75 MG 24 hr capsule Take 75 mg by mouth daily.     metoprolol succinate (TOPROL-XL) 25 MG 24 hr tablet Take 1 tablet (25 mg total) by mouth daily. 30 tablet 0   pantoprazole (PROTONIX) 40 MG tablet Take 1 tablet (40 mg total) by mouth daily. 30 tablet 5   No current facility-administered medications for this visit.    OBJECTIVE: Vitals:   10/07/22 0901  BP: 110/61  Pulse: 89  Resp: 20  Temp: 98.7 F (37.1 C)  SpO2: 96%      Body mass index is 23.69 kg/m.    ECOG FS:1 - Symptomatic but completely ambulatory  General: Well-developed, well-nourished, no acute distress.  Sitting in a wheelchair. Eyes: Pink conjunctiva, anicteric sclera. HEENT: Normocephalic, moist mucous membranes. Lungs: No audible wheezing or coughing. Heart: Regular rate and rhythm. Abdomen: Soft, nontender, no obvious distention. Musculoskeletal: Scant edema. Neuro: Alert, answering all questions appropriately. Cranial nerves grossly intact. Skin: No rashes or petechiae noted. Psych: Normal affect.   LAB RESULTS:  Lab Results  Component Value Date   NA 140 10/07/2022   K 2.9 (L) 10/07/2022   CL 97 (L) 10/07/2022   CO2 32 10/07/2022   GLUCOSE 113 (H) 10/07/2022   BUN 16 10/07/2022   CREATININE 1.06 (H) 10/07/2022   CALCIUM 7.6 (L) 10/07/2022   PROT 6.7 10/07/2022   ALBUMIN 2.8 (L) 10/07/2022   AST 14 (L) 10/07/2022   ALT 8 10/07/2022   ALKPHOS 58  10/07/2022   BILITOT 0.8 10/07/2022   GFRNONAA 55 (L) 10/07/2022    Lab Results  Component Value Date   WBC 5.0 10/07/2022   NEUTROABS 3.4 10/07/2022   HGB 9.4 (L) 10/07/2022   HCT 31.2 (L) 10/07/2022   MCV 89.9 10/07/2022   PLT 175 10/07/2022     STUDIES: No results found.  ASSESSMENT: Stage IIIc colon cancer.  PLAN:    Stage IIIc colon cancer: Patient underwent complete surgical resection at [facility] on May 23, 2022.  Large colon mass was noted to partially invade stomach as well as surrounding mesentery.  PET scan did not reveal any metastatic disease other than a positive lung nodule which was confirmed to be a second primary.  Patient's preop CEA was 171.  Patient will require adjuvant chemotherapy using FOLFOX every 2 weeks x 12.  Patient has now had port placement.  Proceed with cycle 7  of treatment today.  Patient also received IV potassium.  Return to clinic in 2 days for pump removal and then in 2 weeks for further evaluation and consideration of cycle 8.     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  Patient completed XRT in May 2023.  CT scan results from September 01, 2022 reviewed independently and reported as above with partial regression of nodule indicating treatment effect.  Repeat CT scan of chest in approximately April 2024. Pain: Resolved. Anemia: Hemoglobin improved to 9.4 with transfusion.  Monitor.   Thrombocytopenia: Resolved.   Hyperglycemia: Improved.   Hypokalemia: IV potassium as above.  Patient was also given a prescription for oral KCl Neutropenia: Resolved.  May need to consider Udenyca with subsequent treatments. Shortness of breath/peripheral edema: We will get chest x-ray today and a referral was sent to cardiology.   Patient expressed understanding and was in agreement with this plan. She also understands that She can call clinic at any time with any questions, concerns, or  complaints.    Cancer Staging  Colon cancer [facility]) Staging form: Colon and Rectum, AJCC 8th Edition - Pathologic stage from 06/10/2022: Stage IIIC (pT4b, pN1c, cM0) - Signed by Lloyd Huger, MD on 06/10/2022 Stage prefix: Initial diagnosis Total positive nodes: 0 Histologic grading system: 4 grade system Histologic grade (G): G3   Lloyd Huger, MD   10/07/2022 9:48 AM

## 2022-10-09 ENCOUNTER — Inpatient Hospital Stay: Payer: Medicare Other

## 2022-10-09 VITALS — BP 154/80

## 2022-10-09 DIAGNOSIS — Z5111 Encounter for antineoplastic chemotherapy: Secondary | ICD-10-CM | POA: Diagnosis not present

## 2022-10-09 DIAGNOSIS — C184 Malignant neoplasm of transverse colon: Secondary | ICD-10-CM

## 2022-10-09 MED ORDER — SODIUM CHLORIDE 0.9% FLUSH
10.0000 mL | INTRAVENOUS | Status: DC | PRN
  Administered 2022-10-09: 10 mL
  Filled 2022-10-09: qty 10

## 2022-10-09 MED ORDER — HEPARIN SOD (PORK) LOCK FLUSH 100 UNIT/ML IV SOLN
500.0000 [IU] | Freq: Once | INTRAVENOUS | Status: AC | PRN
  Administered 2022-10-09: 500 [IU]
  Filled 2022-10-09: qty 5

## 2022-10-11 ENCOUNTER — Emergency Department: Payer: Medicare Other

## 2022-10-11 ENCOUNTER — Other Ambulatory Visit: Payer: Self-pay

## 2022-10-11 ENCOUNTER — Inpatient Hospital Stay
Admission: EM | Admit: 2022-10-11 | Discharge: 2022-10-13 | DRG: 682 | Disposition: A | Discharge status: Home / Self Care | Payer: Medicare Other | Attending: Hospitalist | Admitting: Hospitalist

## 2022-10-11 DIAGNOSIS — Z9981 Dependence on supplemental oxygen: Secondary | ICD-10-CM

## 2022-10-11 DIAGNOSIS — L89326 Pressure-induced deep tissue damage of left buttock: Secondary | ICD-10-CM | POA: Diagnosis present

## 2022-10-11 DIAGNOSIS — N179 Acute kidney failure, unspecified: Principal | ICD-10-CM | POA: Diagnosis present

## 2022-10-11 DIAGNOSIS — E876 Hypokalemia: Secondary | ICD-10-CM | POA: Diagnosis present

## 2022-10-11 DIAGNOSIS — T426X5A Adverse effect of other antiepileptic and sedative-hypnotic drugs, initial encounter: Secondary | ICD-10-CM | POA: Diagnosis present

## 2022-10-11 DIAGNOSIS — J9611 Chronic respiratory failure with hypoxia: Secondary | ICD-10-CM | POA: Diagnosis present

## 2022-10-11 DIAGNOSIS — E119 Type 2 diabetes mellitus without complications: Secondary | ICD-10-CM

## 2022-10-11 DIAGNOSIS — Z79899 Other long term (current) drug therapy: Secondary | ICD-10-CM

## 2022-10-11 DIAGNOSIS — F32A Depression, unspecified: Secondary | ICD-10-CM | POA: Diagnosis present

## 2022-10-11 DIAGNOSIS — E785 Hyperlipidemia, unspecified: Secondary | ICD-10-CM | POA: Diagnosis present

## 2022-10-11 DIAGNOSIS — I110 Hypertensive heart disease with heart failure: Secondary | ICD-10-CM | POA: Diagnosis present

## 2022-10-11 DIAGNOSIS — E860 Dehydration: Secondary | ICD-10-CM | POA: Diagnosis present

## 2022-10-11 DIAGNOSIS — Z1152 Encounter for screening for COVID-19: Secondary | ICD-10-CM

## 2022-10-11 DIAGNOSIS — F419 Anxiety disorder, unspecified: Secondary | ICD-10-CM | POA: Diagnosis present

## 2022-10-11 DIAGNOSIS — F17290 Nicotine dependence, other tobacco product, uncomplicated: Secondary | ICD-10-CM | POA: Diagnosis present

## 2022-10-11 DIAGNOSIS — G9341 Metabolic encephalopathy: Secondary | ICD-10-CM | POA: Diagnosis present

## 2022-10-11 DIAGNOSIS — L89322 Pressure ulcer of left buttock, stage 2: Secondary | ICD-10-CM | POA: Diagnosis not present

## 2022-10-11 DIAGNOSIS — Z794 Long term (current) use of insulin: Secondary | ICD-10-CM | POA: Diagnosis not present

## 2022-10-11 DIAGNOSIS — Z85118 Personal history of other malignant neoplasm of bronchus and lung: Secondary | ICD-10-CM | POA: Diagnosis not present

## 2022-10-11 DIAGNOSIS — D6959 Other secondary thrombocytopenia: Secondary | ICD-10-CM | POA: Diagnosis present

## 2022-10-11 DIAGNOSIS — J449 Chronic obstructive pulmonary disease, unspecified: Secondary | ICD-10-CM | POA: Diagnosis present

## 2022-10-11 DIAGNOSIS — C3490 Malignant neoplasm of unspecified part of unspecified bronchus or lung: Secondary | ICD-10-CM | POA: Diagnosis present

## 2022-10-11 DIAGNOSIS — I959 Hypotension, unspecified: Secondary | ICD-10-CM | POA: Diagnosis present

## 2022-10-11 DIAGNOSIS — Z923 Personal history of irradiation: Secondary | ICD-10-CM | POA: Diagnosis not present

## 2022-10-11 DIAGNOSIS — D6481 Anemia due to antineoplastic chemotherapy: Secondary | ICD-10-CM | POA: Diagnosis present

## 2022-10-11 DIAGNOSIS — L89300 Pressure ulcer of unspecified buttock, unstageable: Secondary | ICD-10-CM | POA: Diagnosis present

## 2022-10-11 DIAGNOSIS — T451X5A Adverse effect of antineoplastic and immunosuppressive drugs, initial encounter: Secondary | ICD-10-CM | POA: Diagnosis present

## 2022-10-11 DIAGNOSIS — C189 Malignant neoplasm of colon, unspecified: Secondary | ICD-10-CM | POA: Diagnosis present

## 2022-10-11 DIAGNOSIS — Z885 Allergy status to narcotic agent status: Secondary | ICD-10-CM

## 2022-10-11 DIAGNOSIS — I5022 Chronic systolic (congestive) heart failure: Secondary | ICD-10-CM | POA: Diagnosis present

## 2022-10-11 DIAGNOSIS — Z8673 Personal history of transient ischemic attack (TIA), and cerebral infarction without residual deficits: Secondary | ICD-10-CM

## 2022-10-11 DIAGNOSIS — J9601 Acute respiratory failure with hypoxia: Principal | ICD-10-CM

## 2022-10-11 DIAGNOSIS — Y92009 Unspecified place in unspecified non-institutional (private) residence as the place of occurrence of the external cause: Secondary | ICD-10-CM | POA: Diagnosis not present

## 2022-10-11 DIAGNOSIS — J4489 Other specified chronic obstructive pulmonary disease: Secondary | ICD-10-CM | POA: Diagnosis present

## 2022-10-11 DIAGNOSIS — S31809A Unspecified open wound of unspecified buttock, initial encounter: Secondary | ICD-10-CM

## 2022-10-11 DIAGNOSIS — E1165 Type 2 diabetes mellitus with hyperglycemia: Secondary | ICD-10-CM | POA: Diagnosis present

## 2022-10-11 DIAGNOSIS — Z793 Long term (current) use of hormonal contraceptives: Secondary | ICD-10-CM

## 2022-10-11 LAB — CBC WITH DIFFERENTIAL/PLATELET
Abs Immature Granulocytes: 0.01 10*3/uL (ref 0.00–0.07)
Basophils Absolute: 0 10*3/uL (ref 0.0–0.1)
Basophils Relative: 0 %
Eosinophils Absolute: 0 10*3/uL (ref 0.0–0.5)
Eosinophils Relative: 0 %
HCT: 28 % — ABNORMAL LOW (ref 36.0–46.0)
Hemoglobin: 8.5 g/dL — ABNORMAL LOW (ref 12.0–15.0)
Immature Granulocytes: 0 %
Lymphocytes Relative: 15 %
Lymphs Abs: 1 10*3/uL (ref 0.7–4.0)
MCH: 27.3 pg (ref 26.0–34.0)
MCHC: 30.4 g/dL (ref 30.0–36.0)
MCV: 90 fL (ref 80.0–100.0)
Monocytes Absolute: 0.1 10*3/uL (ref 0.1–1.0)
Monocytes Relative: 2 %
Neutro Abs: 5.4 10*3/uL (ref 1.7–7.7)
Neutrophils Relative %: 83 %
Platelets: 155 10*3/uL (ref 150–400)
RBC: 3.11 MIL/uL — ABNORMAL LOW (ref 3.87–5.11)
RDW: 19 % — ABNORMAL HIGH (ref 11.5–15.5)
WBC: 6.5 10*3/uL (ref 4.0–10.5)
nRBC: 0 % (ref 0.0–0.2)

## 2022-10-11 LAB — URINALYSIS, COMPLETE (UACMP) WITH MICROSCOPIC
Bilirubin Urine: NEGATIVE
Glucose, UA: NEGATIVE mg/dL
Hgb urine dipstick: NEGATIVE
Ketones, ur: NEGATIVE mg/dL
Leukocytes,Ua: NEGATIVE
Nitrite: NEGATIVE
Protein, ur: 30 mg/dL — AB
Specific Gravity, Urine: 1.013 (ref 1.005–1.030)
pH: 5 (ref 5.0–8.0)

## 2022-10-11 LAB — BLOOD GAS, ARTERIAL
Acid-Base Excess: 4.5 mmol/L — ABNORMAL HIGH (ref 0.0–2.0)
Bicarbonate: 31.2 mmol/L — ABNORMAL HIGH (ref 20.0–28.0)
O2 Content: 4 L/min
O2 Saturation: 93.3 %
Patient temperature: 37
pCO2 arterial: 54 mmHg — ABNORMAL HIGH (ref 32–48)
pH, Arterial: 7.37 (ref 7.35–7.45)
pO2, Arterial: 73 mmHg — ABNORMAL LOW (ref 83–108)

## 2022-10-11 LAB — LACTIC ACID, PLASMA: Lactic Acid, Venous: 1.3 mmol/L (ref 0.5–1.9)

## 2022-10-11 LAB — COMPREHENSIVE METABOLIC PANEL
ALT: 10 U/L (ref 0–44)
AST: 19 U/L (ref 15–41)
Albumin: 2.9 g/dL — ABNORMAL LOW (ref 3.5–5.0)
Alkaline Phosphatase: 49 U/L (ref 38–126)
Anion gap: 10 (ref 5–15)
BUN: 39 mg/dL — ABNORMAL HIGH (ref 8–23)
CO2: 30 mmol/L (ref 22–32)
Calcium: 7.6 mg/dL — ABNORMAL LOW (ref 8.9–10.3)
Chloride: 100 mmol/L (ref 98–111)
Creatinine, Ser: 2.15 mg/dL — ABNORMAL HIGH (ref 0.44–1.00)
GFR, Estimated: 24 mL/min — ABNORMAL LOW (ref >60)
Glucose, Bld: 127 mg/dL — ABNORMAL HIGH (ref 70–99)
Potassium: 4.6 mmol/L (ref 3.5–5.1)
Sodium: 140 mmol/L (ref 135–145)
Total Bilirubin: 0.8 mg/dL (ref 0.3–1.2)
Total Protein: 6.4 g/dL — ABNORMAL LOW (ref 6.5–8.1)

## 2022-10-11 LAB — GLUCOSE, CAPILLARY
Glucose-Capillary: 206 mg/dL — ABNORMAL HIGH (ref 70–99)
Glucose-Capillary: 178 mg/dL — ABNORMAL HIGH (ref 70–99)

## 2022-10-11 LAB — HEMOGLOBIN A1C
Hgb A1c MFr Bld: 7 % — ABNORMAL HIGH (ref 4.8–5.6)
Mean Plasma Glucose: 154.2 mg/dL

## 2022-10-11 LAB — PROTIME-INR
INR: 1.1 (ref 0.8–1.2)
Prothrombin Time: 14.3 seconds (ref 11.4–15.2)

## 2022-10-11 LAB — TROPONIN I (HIGH SENSITIVITY)
Troponin I (High Sensitivity): 21 ng/L — ABNORMAL HIGH (ref <18)
Troponin I (High Sensitivity): 23 ng/L — ABNORMAL HIGH (ref <18)

## 2022-10-11 LAB — RESP PANEL BY RT-PCR (FLU A&B, COVID) ARPGX2
Influenza A by PCR: NEGATIVE
Influenza B by PCR: NEGATIVE
SARS Coronavirus 2 by RT PCR: NEGATIVE

## 2022-10-11 LAB — BRAIN NATRIURETIC PEPTIDE: B Natriuretic Peptide: 118 pg/mL — ABNORMAL HIGH (ref 0.0–100.0)

## 2022-10-11 LAB — D-DIMER, QUANTITATIVE: D-Dimer, Quant: 2.83 ug/mL-FEU — ABNORMAL HIGH (ref 0.00–0.50)

## 2022-10-11 LAB — APTT: aPTT: 39 seconds — ABNORMAL HIGH (ref 24–36)

## 2022-10-11 MED ORDER — ENOXAPARIN SODIUM 30 MG/0.3ML IJ SOSY
30.0000 mg | PREFILLED_SYRINGE | INTRAMUSCULAR | Status: DC
  Administered 2022-10-11: 30 mg via SUBCUTANEOUS
  Filled 2022-10-11: qty 0.3

## 2022-10-11 MED ORDER — SODIUM CHLORIDE 0.9 % IV SOLN
2.0000 g | Freq: Once | INTRAVENOUS | Status: AC
  Administered 2022-10-11: 2 g via INTRAVENOUS
  Filled 2022-10-11: qty 12.5

## 2022-10-11 MED ORDER — ONDANSETRON HCL 4 MG/2ML IJ SOLN: 4.0000 mg | Freq: Four times a day (QID) | INTRAMUSCULAR | Status: DC | PRN

## 2022-10-11 MED ORDER — VANCOMYCIN HCL IN DEXTROSE 1-5 GM/200ML-% IV SOLN
1000.0000 mg | Freq: Once | INTRAVENOUS | Status: AC
  Administered 2022-10-11: 1000 mg via INTRAVENOUS
  Filled 2022-10-11: qty 200

## 2022-10-11 MED ORDER — RANOLAZINE ER 500 MG PO TB12: 500.0000 mg | ORAL_TABLET | Freq: Two times a day (BID) | ORAL | Status: DC

## 2022-10-11 MED ORDER — ONDANSETRON HCL 4 MG PO TABS: 4.0000 mg | ORAL_TABLET | Freq: Four times a day (QID) | ORAL | Status: DC | PRN

## 2022-10-11 MED ORDER — ATORVASTATIN CALCIUM 20 MG PO TABS
40.0000 mg | ORAL_TABLET | Freq: Every day | ORAL | Status: DC
  Administered 2022-10-11 – 2022-10-13 (×3): 40 mg via ORAL
  Filled 2022-10-11 (×3): qty 2

## 2022-10-11 MED ORDER — INSULIN GLARGINE-YFGN 100 UNIT/ML ~~LOC~~ SOLN
10.0000 [IU] | Freq: Every day | SUBCUTANEOUS | Status: DC
  Administered 2022-10-11 – 2022-10-12 (×2): 10 [IU] via SUBCUTANEOUS
  Filled 2022-10-11 (×3): qty 0.1

## 2022-10-11 MED ORDER — SODIUM CHLORIDE 0.9 % IV SOLN: INTRAVENOUS | Status: DC

## 2022-10-11 MED ORDER — SODIUM CHLORIDE 0.9 % IV SOLN: INTRAVENOUS | Status: AC

## 2022-10-11 MED ORDER — SODIUM CHLORIDE 0.9 % IV BOLUS
1000.0000 mL | Freq: Once | INTRAVENOUS | Status: AC
  Administered 2022-10-11: 1000 mL via INTRAVENOUS

## 2022-10-11 MED ORDER — LACTATED RINGERS IV BOLUS
1000.0000 mL | Freq: Once | INTRAVENOUS | Status: AC
  Administered 2022-10-11: 1000 mL via INTRAVENOUS

## 2022-10-11 MED ORDER — INSULIN ASPART 100 UNIT/ML IJ SOLN
0.0000 [IU] | Freq: Three times a day (TID) | INTRAMUSCULAR | Status: DC
  Administered 2022-10-11: 5 [IU] via SUBCUTANEOUS
  Administered 2022-10-12: 3 [IU] via SUBCUTANEOUS
  Administered 2022-10-12: 5 [IU] via SUBCUTANEOUS
  Administered 2022-10-12 – 2022-10-13 (×2): 3 [IU] via SUBCUTANEOUS
  Administered 2022-10-13: 8 [IU] via SUBCUTANEOUS
  Filled 2022-10-11 (×7): qty 1

## 2022-10-11 MED ORDER — GABAPENTIN 100 MG PO CAPS
200.0000 mg | ORAL_CAPSULE | Freq: Two times a day (BID) | ORAL | Status: DC
  Administered 2022-10-11 – 2022-10-13 (×4): 200 mg via ORAL
  Filled 2022-10-11 (×4): qty 2

## 2022-10-11 MED ORDER — PANTOPRAZOLE SODIUM 40 MG PO TBEC
40.0000 mg | DELAYED_RELEASE_TABLET | Freq: Every day | ORAL | Status: DC
  Administered 2022-10-12 – 2022-10-13 (×2): 40 mg via ORAL
  Filled 2022-10-11 (×2): qty 1

## 2022-10-11 MED ORDER — VENLAFAXINE HCL ER 75 MG PO CP24
75.0000 mg | ORAL_CAPSULE | Freq: Every day | ORAL | Status: DC
  Administered 2022-10-11 – 2022-10-13 (×3): 75 mg via ORAL
  Filled 2022-10-11 (×3): qty 1

## 2022-10-11 MED ORDER — METRONIDAZOLE 500 MG/100ML IV SOLN
500.0000 mg | Freq: Once | INTRAVENOUS | Status: AC
  Administered 2022-10-11: 500 mg via INTRAVENOUS
  Filled 2022-10-11: qty 100

## 2022-10-11 MED ORDER — GABAPENTIN 600 MG PO TABS: 600.0000 mg | ORAL_TABLET | Freq: Two times a day (BID) | ORAL | Status: DC

## 2022-10-11 MED ORDER — ALBUTEROL (5 MG/ML) CONTINUOUS INHALATION SOLN: 3.0000 mL | INHALATION_SOLUTION | Freq: Four times a day (QID) | RESPIRATORY_TRACT | Status: DC | PRN

## 2022-10-11 MED ORDER — FENOFIBRATE 54 MG PO TABS: 54.0000 mg | ORAL_TABLET | Freq: Every day | ORAL | Status: DC

## 2022-10-11 MED ORDER — ACETAMINOPHEN 325 MG PO TABS
650.0000 mg | ORAL_TABLET | Freq: Four times a day (QID) | ORAL | Status: DC | PRN
  Administered 2022-10-13 (×2): 650 mg via ORAL
  Filled 2022-10-11 (×2): qty 2

## 2022-10-11 NOTE — Assessment & Plan Note (Addendum)
With chronic respiratory failure on 2 L of oxygen Stable and not acutely exacerbated Continue as needed bronchodilator therapy Continue oxygen supplementation to maintain pulse oximetry greater than 92%.

## 2022-10-11 NOTE — Assessment & Plan Note (Signed)
Status postsurgical resection and currently on chemotherapy Follow-up with oncology as an outpatient

## 2022-10-11 NOTE — Consult Note (Signed)
Date of Consultation:  10/11/2022  Requesting Physician:  Collier Bullock, MD  Reason for Consultation:  Left buttocks wound  History of Present Illness: Jessica Stout is a 71 y.o. female admitted today to the medical team with altered mental status.  Patient has a history of stage III colon cancer status postresection in June of this year at [facility].  Patient currently is on adjuvant chemotherapy with the last dose 2 days ago.  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  The patient's husband presents the history reports that she was difficult to arouse this morning and was found to have be hypoxic on room air and with some hypotension.  The patient was admitted to the medical team for further work-up.  In the emergency room, during evaluation, she was noted to have a wound in the left buttocks with some blistering at the skin.  The patient had a CT scan of her chest, abdomen, and pelvis which did not show any fluid collection in the buttocks area but I am able to see skin thickening and some cellulitis without any fluid collections.      Once in her room after being admitted, the patient seems to be more alert.  I was able to ask her more questions and the patient reports that she has had this wound for some time although she is not able to tell me exactly for how long.  Denies any worsening pain and denies any current drainage. Denies any fevers or chills, chest pain, shortness of breath.  Past Medical History: Past Medical History:  Diagnosis Date   Anemia    Anxiety    Asthma    Cancer ([place])    colon   CHF (congestive heart failure) (HCC)    COPD (chronic obstructive pulmonary disease) (HCC)    Diabetes mellitus without complication ([place])    Hyperlipidemia    Hypertension    Stroke [facility])      Past Surgical History: Past Surgical History:  Procedure Laterality Date   COLONOSCOPY WITH PROPOFOL N/A 02/03/2022    Procedure: COLONOSCOPY WITH PROPOFOL;  Surgeon: Lin Landsman, MD;  Location: [facility] ENDOSCOPY;  Service: Gastroenterology;  Laterality: N/A;   ESOPHAGOGASTRODUODENOSCOPY (EGD) WITH PROPOFOL N/A 02/03/2022   Procedure: ESOPHAGOGASTRODUODENOSCOPY (EGD) WITH PROPOFOL;  Surgeon: Lin Landsman, MD;  Location[facility] ENDOSCOPY;  Service: Gastroenterology;  Laterality: N/A;   GIVENS CAPSULE STUDY N/A 02/03/2022   Procedure: GIVENS CAPSULE STUDY;  Surgeon: Lin Landsman, MD;  Location: [facility] ENDOSCOPY;  Service: Gastroenterology;  Laterality: N/A;  possible Capsule Study   IR IMAGING GUIDED PORT INSERTION  06/18/2022    Home Medications: Prior to Admission medications   Medication Sig Start Date End Date Taking? Authorizing Provider  atorvastatin (LIPITOR) 40 MG tablet Take 40 mg by mouth daily. 12/12/21  Yes [provider]  fenofibrate 54 MG tablet Take 54 mg by mouth daily. 10/04/22  Yes [provider]  furosemide (LASIX) 40 MG tablet Take 40 mg by mouth daily. 01/23/22  Yes [provider]  gabapentin (NEURONTIN) 600 MG tablet Take by mouth 2 (two) times daily. 01/01/22  Yes [provider]  losartan (COZAAR) 25 MG tablet Take 25 mg by mouth daily. 10/04/22  Yes [provider]  metoprolol succinate (TOPROL-XL) 25 MG 24 hr tablet Take 1 tablet (25 mg total) by mouth daily. 02/06/22 10/11/22 Yes Wyvonnia Dusky, MD  pantoprazole (PROTONIX) 40 MG tablet Take  1 tablet (40 mg total) by mouth daily. 03/11/22 10/11/22 Yes Hackney, Otila Kluver A, FNP  potassium chloride SA (KLOR-CON M) 20 MEQ tablet Take 1 tablet (20 mEq total) by mouth 2 (two) times daily. 10/07/22  Yes Lloyd Huger, MD  ranolazine (RANEXA) 500 MG 12 hr tablet Take 500 mg by mouth 2 (two) times daily.   Yes [provider]  sitaGLIPtin-metformin (JANUMET) 50-1000 MG tablet Take 1 tablet by mouth 2 (two) times daily with a meal.   Yes [provider]  venlafaxine XR  (EFFEXOR-XR) 75 MG 24 hr capsule Take 75 mg by mouth daily. 12/07/21  Yes [provider]  acetaminophen (TYLENOL) 325 MG tablet Take 650 mg by mouth every 6 (six) hours as needed.    [provider]  albuterol (VENTOLIN HFA) 108 (90 Base) MCG/ACT inhaler Inhale into the lungs. 01/19/22   [provider]  insulin lispro (HUMALOG) 100 UNIT/ML injection Inject 1-10 Units into the skin 3 (three) times daily before meals. Per sliding scale    [provider]  LANTUS SOLOSTAR 100 UNIT/ML Solostar Pen Inject 12 Units into the skin at bedtime. 01/17/22   [provider]  oxyCODONE (OXY IR/ROXICODONE) 5 MG immediate release tablet Take 1 tablet (5 mg total) by mouth every 4 (four) hours as needed for severe pain. 07/29/22   Verlon Au, NP  prochlorperazine (COMPAZINE) 10 MG tablet Take 1 tablet (10 mg total) by mouth every 6 (six) hours as needed (Nausea or vomiting). 06/10/22 07/10/22  Lloyd Huger, MD    Allergies: Allergies  Allergen Reactions   Codeine Rash    Happened in 1970s    Social History:  reports that she has been smoking e-cigarettes. She does not have any smokeless tobacco history on file. She reports current alcohol use. No history on file for drug use.   Family History: History reviewed. No pertinent family history.  Review of Systems: Review of Systems  Constitutional:  Negative for chills and fever.  Respiratory:  Negative for shortness of breath.   Cardiovascular:  Negative for chest pain.  Gastrointestinal:  Negative for abdominal pain, nausea and vomiting.  Skin:        Wound in the left buttocks    Physical Exam BP 120/63 (BP Location: Left Leg)   Pulse 85   Temp 97.8 F (36.6 C)   Resp 20   Ht 5\' 4"  (1.626 m)   Wt 63.8 kg   SpO2 100%   BMI 24.15 kg/m  CONSTITUTIONAL: No acute distress HEENT:  Normocephalic, atraumatic, extraocular motion intact. RESPIRATORY:  Normal respiratory effort without pathologic use  of accessory muscles.  Currently using nasal cannula. CARDIOVASCULAR: Regular rhythm and rate MUSCULOSKELETAL:  Normal muscle strength and tone in all four extremities.  No peripheral edema or cyanosis. SKIN: The patient has an area measuring about 6 x 3-1/2 cm in the medial left buttocks that has induration with erythema.  The skin itself has 2 areas of blistering that have drained already and there is some denuding of the superficial skin layer.  This appears to be more consistent with a pressure ulcer.  I do not palpate any fluctuance and there is no active drainage.  There is no significant tenderness on palpation. NEUROLOGIC:  Motor and sensation is grossly normal.  Cranial nerves are grossly intact.   Laboratory Analysis: Results for orders placed or performed during the hospital encounter of 10/11/22 (from the past 24 hour(s))  Resp Panel by RT-PCR (Flu  A&B, Covid) Anterior Nasal Swab     Status: None   Collection Time: 10/11/22  8:42 AM   Specimen: Anterior Nasal Swab  Result Value Ref Range   SARS Coronavirus 2 by RT PCR NEGATIVE NEGATIVE   Influenza A by PCR NEGATIVE NEGATIVE   Influenza B by PCR NEGATIVE NEGATIVE  Lactic acid, plasma     Status: None   Collection Time: 10/11/22  8:42 AM  Result Value Ref Range   Lactic Acid, Venous 1.3 0.5 - 1.9 mmol/L  Comprehensive metabolic panel     Status: Abnormal   Collection Time: 10/11/22  8:42 AM  Result Value Ref Range   Sodium 140 135 - 145 mmol/L   Potassium 4.6 3.5 - 5.1 mmol/L   Chloride 100 98 - 111 mmol/L   CO2 30 22 - 32 mmol/L   Glucose, Bld 127 (H) 70 - 99 mg/dL   BUN 39 (H) 8 - 23 mg/dL   Creatinine, Ser 2.15 (H) 0.44 - 1.00 mg/dL   Calcium 7.6 (L) 8.9 - 10.3 mg/dL   Total Protein 6.4 (L) 6.5 - 8.1 g/dL   Albumin 2.9 (L) 3.5 - 5.0 g/dL   AST 19 15 - 41 U/L   ALT 10 0 - 44 U/L   Alkaline Phosphatase 49 38 - 126 U/L   Total Bilirubin 0.8 0.3 - 1.2 mg/dL   GFR, Estimated 24 (L) >60 mL/min   Anion gap 10 5 - 15  CBC  with Differential     Status: Abnormal   Collection Time: 10/11/22  8:42 AM  Result Value Ref Range   WBC 6.5 4.0 - 10.5 K/uL   RBC 3.11 (L) 3.87 - 5.11 MIL/uL   Hemoglobin 8.5 (L) 12.0 - 15.0 g/dL   HCT 28.0 (L) 36.0 - 46.0 %   MCV 90.0 80.0 - 100.0 fL   MCH 27.3 26.0 - 34.0 pg   MCHC 30.4 30.0 - 36.0 g/dL   RDW 19.0 (H) 11.5 - 15.5 %   Platelets 155 150 - 400 K/uL   nRBC 0.0 0.0 - 0.2 %   Neutrophils Relative % 83 %   Neutro Abs 5.4 1.7 - 7.7 K/uL   Lymphocytes Relative 15 %   Lymphs Abs 1.0 0.7 - 4.0 K/uL   Monocytes Relative 2 %   Monocytes Absolute 0.1 0.1 - 1.0 K/uL   Eosinophils Relative 0 %   Eosinophils Absolute 0.0 0.0 - 0.5 K/uL   Basophils Relative 0 %   Basophils Absolute 0.0 0.0 - 0.1 K/uL   Immature Granulocytes 0 %   Abs Immature Granulocytes 0.01 0.00 - 0.07 K/uL  Protime-INR     Status: None   Collection Time: 10/11/22  8:42 AM  Result Value Ref Range   Prothrombin Time 14.3 11.4 - 15.2 seconds   INR 1.1 0.8 - 1.2  APTT     Status: Abnormal   Collection Time: 10/11/22  8:42 AM  Result Value Ref Range   aPTT 39 (H) 24 - 36 seconds  Urinalysis, Complete w Microscopic Urine, Catheterized     Status: Abnormal   Collection Time: 10/11/22  8:42 AM  Result Value Ref Range   Color, Urine YELLOW (A) YELLOW   APPearance HAZY (A) CLEAR   Specific Gravity, Urine 1.013 1.005 - 1.030   pH 5.0 5.0 - 8.0   Glucose, UA NEGATIVE NEGATIVE mg/dL   Hgb urine dipstick NEGATIVE NEGATIVE   Bilirubin Urine NEGATIVE NEGATIVE   Ketones, ur NEGATIVE NEGATIVE mg/dL  Protein, ur 30 (A) NEGATIVE mg/dL   Nitrite NEGATIVE NEGATIVE   Leukocytes,Ua NEGATIVE NEGATIVE   RBC / HPF 0-5 0 - 5 RBC/hpf   WBC, UA 0-5 0 - 5 WBC/hpf   Bacteria, UA RARE (A) NONE SEEN   Squamous Epithelial / LPF 0-5 0 - 5   Mucus PRESENT    Hyaline Casts, UA PRESENT   Troponin I (High Sensitivity)     Status: Abnormal   Collection Time: 10/11/22  8:42 AM  Result Value Ref Range   Troponin I (High  Sensitivity) 21 (H) <18 ng/L  Brain natriuretic peptide     Status: Abnormal   Collection Time: 10/11/22  8:42 AM  Result Value Ref Range   B Natriuretic Peptide 118.0 (H) 0.0 - 100.0 pg/mL  Troponin I (High Sensitivity)     Status: Abnormal   Collection Time: 10/11/22 10:45 AM  Result Value Ref Range   Troponin I (High Sensitivity) 23 (H) <18 ng/L  D-dimer, quantitative     Status: Abnormal   Collection Time: 10/11/22  1:02 PM  Result Value Ref Range   D-Dimer, Quant 2.83 (H) 0.00 - 0.50 ug/mL-FEU  Blood gas, arterial     Status: Abnormal   Collection Time: 10/11/22  1:30 PM  Result Value Ref Range   O2 Content 4.0 L/min   Delivery systems NASAL CANNULA    pH, Arterial 7.37 7.35 - 7.45   pCO2 arterial 54 (H) 32 - 48 mmHg   pO2, Arterial 73 (L) 83 - 108 mmHg   Bicarbonate 31.2 (H) 20.0 - 28.0 mmol/L   Acid-Base Excess 4.5 (H) 0.0 - 2.0 mmol/L   O2 Saturation 93.3 %   Patient temperature 37.0    Collection site RIGHT RADIAL    Allens test (pass/fail) PASS PASS  Glucose, capillary     Status: Abnormal   Collection Time: 10/11/22  4:45 PM  Result Value Ref Range   Glucose-Capillary 206 (H) 70 - 99 mg/dL    Imaging: US Venous Img Lower Bilateral  Result Date: 10/11/2022 CLINICAL DATA:  Leg pain. EXAM: BILATERAL LOWER EXTREMITY VENOUS DOPPLER ULTRASOUND TECHNIQUE: Gray-scale sonography with compression, as well as color and duplex ultrasound, were performed to evaluate the deep venous system(s) from the level of the common femoral vein through the popliteal and proximal calf veins. COMPARISON:  None Available. FINDINGS: VENOUS Normal compressibility of the common femoral, superficial femoral, and popliteal veins, as well as the visualized calf veins. Visualized portions of profunda femoral vein and great saphenous vein unremarkable. No filling defects to suggest DVT on grayscale or color Doppler imaging. Doppler waveforms show normal direction of venous flow, normal respiratory  plasticity and response to augmentation. Limited views of the contralateral common femoral vein are unremarkable. OTHER None. Limitations: none IMPRESSION: Negative. Electronically Signed   By: Keane Police D.O.   On: 10/11/2022 13:17   CT CHEST ABDOMEN PELVIS WO CONTRAST  Result Date: 10/11/2022 CLINICAL DATA:  Abdominal pain.  History of colon carcinoma. EXAM: CT CHEST, ABDOMEN AND PELVIS WITHOUT CONTRAST TECHNIQUE: Multidetector CT imaging of the chest, abdomen and pelvis was performed following the standard protocol without IV contrast. RADIATION DOSE REDUCTION: This exam was performed according to the departmental dose-optimization program which includes automated exposure control, adjustment of the mA and/or kV according to patient size and/or use of iterative reconstruction technique. COMPARISON:  08/29/2022, CT chest. CT abdomen pelvis, 02/03/2022. PET-CT, 02/27/2022. FINDINGS: CT CHEST FINDINGS Cardiovascular: Heart mildly enlarged. Three-vessel coronary artery calcifications. No pericardial  effusion. Great vessels are normal in caliber. Mild aortic atherosclerosis. Mediastinum/Nodes: No neck base, mediastinal or hilar masses or enlarged lymph nodes. Trachea and esophagus are unremarkable. Lungs/Pleura: Bilateral lung base opacities, mostly in the lower lobes. Additional smaller peripheral opacities are noted in both upper lobes and in the right middle lobe, findings new since the prior CT. Small stable focus of linear type opacity in the posterior left lower lobe, superior segment. Stable 4 mm nodule, posterior left upper lobe, image 45, series 4. Mild changes of centrilobular emphysema. No evidence of pulmonary edema. No pleural effusion or pneumothorax. No discrete lung mass or new nodule. Musculoskeletal: No fracture or acute finding. No osteoblastic or osteolytic lesions. No chest wall mass. CT ABDOMEN PELVIS FINDINGS Hepatobiliary: Liver normal in overall size. Small low-density lesion, inferior  aspect of segment 6, stable and consistent with a cyst. No other liver masses or lesions. Single dependent gallstone. No evidence of acute cholecystitis. No bile duct dilation. Pancreas: Unremarkable. No pancreatic ductal dilatation or surrounding inflammatory changes. Spleen: Normal in size without focal abnormality. Adrenals/Urinary Tract: Heterogeneous left adrenal mass, containing macroscopic fat, 3.1 cm in size, stable. Stable mild right adrenal gland thickening. Stable cyst from the upper pole the right kidney. No other renal masses, no stones and no hydronephrosis. Normal ureters. Bladder is decompressed, otherwise unremarkable. Stomach/Bowel: Bowel anastomosis staples noted along the anterior and inferior aspect of the stomach. No stomach mass or wall thickening. Stomach is mostly decompressed. Small bowel and colonic anastomosis staples and adjacent vascular clips are new since the prior exam. No evidence of bowel obstruction or inflammation. No bowel mass. Vascular/Lymphatic: Dense aortic atherosclerosis. No aneurysm. Prominent gastrohepatic ligament lymph nodes measuring up to 9 mm in short axis. No enlarged intraperitoneal, retroperitoneal or pelvic lymph nodes. Reproductive: Unremarkable. Other: Radiopaque mesh extends along the anterior abdominal wall. Midline incision, well-healed. No abdominal wall mass. No hernia. No ascites. Musculoskeletal: No fracture or acute finding.  No bone lesion. IMPRESSION: 1. There are new areas of lung opacity, at the lung bases, mostly in the lower lobes, and peripheral aspects the upper and right middle lobes. These areas may all be atelectasis. Consider pneumonia if there are consistent clinical findings. 2. Small linear opacity in the left lower lobe superior segment, the larger nodular opacity on prior studies, is stable from the most recent prior exam. Stable ill-defined 4 mm left upper lobe nodule. No new lung nodules. No lung masses. 3. Colon surgery has been  performed since the prior study. Previously seen transverse colon mass is been resected. There new stomach and bowel anastomosis staples. No evidence of bowel obstruction or inflammation. 4. Stable left adrenal mass containing macroscopic fat. 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 No other evidence of metastatic disease within the abdomen or pelvis. 5. No acute abnormalities within the abdomen or pelvis. Electronically Signed   By: Lajean Manes M.D.   On: 10/11/2022 12:00   CT HEAD WO CONTRAST (5MM)  Result Date: 10/11/2022 CLINICAL DATA:  Head trauma.  Found unresponsive. EXAM: CT HEAD WITHOUT CONTRAST TECHNIQUE: Contiguous axial images were obtained from the base of the skull through the vertex without intravenous contrast. RADIATION DOSE REDUCTION: This exam was performed according to the departmental dose-optimization program which includes automated exposure control, adjustment of the mA and/or kV according to patient size and/or use of iterative reconstruction technique. COMPARISON:  None Available. FINDINGS: Brain: There is no evidence for acute hemorrhage, hydrocephalus,  mass lesion, or abnormal extra-axial fluid collection. No definite CT evidence for acute infarction. Vascular: No hyperdense vessel or unexpected calcification. Skull: No evidence for fracture. No worrisome lytic or sclerotic lesion. Sinuses/Orbits: Mucosal thickening with air-fluid level noted right sphenoid sinus. Maxillary, frontal, and mastoid sinuses are clear. Other: None. IMPRESSION: 1. No acute intracranial abnormality. 2. 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 Electronically Signed   By: Misty Stanley M.D.   On: 10/11/2022 11:46   DG Chest Port 1 View  Result Date: 10/11/2022 CLINICAL DATA:  Questionable sepsis - evaluate for abnormality EXAM: PORTABLE CHEST 1 VIEW COMPARISON:  10/07/2022 FINDINGS: Right Port-A-Cath remains in place, unchanged.  Heart is normal size. Bibasilar airspace opacities, favor atelectasis. Possible small bilateral effusions. No acute bony abnormality. IMPRESSION: Worsening bibasilar opacities, likely atelectasis. Question small bilateral effusions. Electronically Signed   By: Rolm Baptise M.D.   On: 10/11/2022 08:27    Assessment and Plan: This is a 71 y.o. female with a left buttocks wound.  - Discussed with the patient and her husband that this wound appears to be more consistent with a pressure wound.  I do not palpate any fluctuance and there is no drainage.  However the skin superficially does have erythema and some blistering.  There is some induration and likely there is some mild degree of infection with this wound.  There is no fluid collection or abscess seen on CT scan or on my exam today.  Discussed with the patient and her husband that the main treatment plan will be IV antibiotics as a precaution particular if there is any degree of infection or cellulitis, and with appropriate wound care and frequent turning, air mattress, Mepilex foam dressings for appropriate padding, and better nutrition. - For now, no need for any surgical debridement of this area.  However we will keep monitoring the wound to make sure it continues to improve.  I spent 45 minutes dedicated to the care of this patient on the date of this encounter to include pre-visit review of records, face-to-face time with the patient discussing diagnosis and management, and any post-visit coordination of care.   Melvyn Neth, MD [facility] Pg:  [phone]

## 2022-10-11 NOTE — Sepsis Progress Note (Signed)
Elink following code sepsis °

## 2022-10-11 NOTE — Assessment & Plan Note (Signed)
Per patient's husband he called EMS because his wife was unarousable this morning She was noted to be hypoxic on room air with pulse oximetry in the 80s At baseline patient is supposed to be on 2 L of oxygen and on 2 L she has a pulse oximetry greater than 92% She is currently oriented to person and place and is now back to baseline per husband We will obtain an arterial blood gas for further evaluation

## 2022-10-11 NOTE — ED Notes (Signed)
Sent secure chat to attending Agbata in inquiry if pt okay to eat/drink if no procedures planned today. Awaiting reply.

## 2022-10-11 NOTE — Assessment & Plan Note (Signed)
At baseline patient has a serum creatinine of 1.06 and today on admission it is 2.15 Patient was also hypotensive on admission and this improved with IV fluid hydration Hold Cozaar and furosemide for now. Repeat renal parameters in a.m.

## 2022-10-11 NOTE — Assessment & Plan Note (Signed)
Continue venlafaxine 

## 2022-10-11 NOTE — ED Notes (Signed)
Pt in NAD; pt currently sitting up eating from tray; husband assisting pt.

## 2022-10-11 NOTE — Assessment & Plan Note (Addendum)
Place patient on a consistent carbohydrate diet Continue long-acting insulin Place patient on NovoLog sliding scale coverage Hold metformin and Sitagliptin

## 2022-10-11 NOTE — Assessment & Plan Note (Signed)
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 Follow-up with oncology as an outpatient

## 2022-10-11 NOTE — Assessment & Plan Note (Signed)
Last known LVEF of 45 to 50% from a 2D echocardiogram which was done 3/23 Hold furosemide and Cozaar due to AKI Hold metoprolol due to relative hypotension

## 2022-10-11 NOTE — ED Provider Notes (Signed)
[facility] Provider Note    Event Date/Time   First MD Initiated Contact with Patient 10/11/22 0809     (approximate)   History   Altered Mental Status   HPI  Jessica Stout is a 71 y.o. female with history of colon cancer potentially possibly still on chemotherapy unclear who comes in with concerns for altered mental status.  It sounds like he has been found her in bed and seemed more difficult to wake up.  Sounds that he tried to give some oral glucose but her sugar was normal with EMS when EMS got there they noted her to have a temperature of 100.4 and oxygen levels in the 80s patient was placed on 4 L of oxygen.  Patient is currently alert and oriented x2.  She is moving all extremities well with equal grip strength.  She denies being in any pain.  Physical Exam   Triage Vital Signs: Blood pressure 125/78, pulse 98, temperature 99.4 F (37.4 C), temperature source Oral, resp. rate (!) 22, weight 64.7 kg, SpO2 (!) 81 %.   Most recent vital signs: Vitals:   10/11/22 0815  BP: 125/78  Pulse: 98  Resp: (!) 22  Temp: 99.4 F (37.4 C)  SpO2: (!) 81%     General: Awake, no distress.  CV:  Good peripheral perfusion.  Resp:  Normal effort.  Patient was placed on 4 L due to hypoxia in the 80s Abd:  No distention.  Other:  Patient is alert and oriented x2.  Equal strength in arms.  Able to lift both legs up off the bed.  Patient has abdominal scar noted but her abdomen is soft and nontender.   ED Results / Procedures / Treatments   Labs (all labs ordered are listed, but only abnormal results are displayed) Labs Reviewed  RESP PANEL BY RT-PCR (FLU A&B, COVID) ARPGX2  CULTURE, BLOOD (ROUTINE X 2)  CULTURE, BLOOD (ROUTINE X 2)  URINE CULTURE  LACTIC ACID, PLASMA  LACTIC ACID, PLASMA  COMPREHENSIVE METABOLIC PANEL  CBC WITH DIFFERENTIAL/PLATELET  PROTIME-INR  APTT  URINALYSIS, COMPLETE (UACMP) WITH MICROSCOPIC  BRAIN NATRIURETIC PEPTIDE   TROPONIN I (HIGH SENSITIVITY)     EKG  My interpretation of EKG:  Normal sinus rate of 86, no st elevation, no twi, nromal intervals.   RADIOLOGY I have reviewed the xray personally and interpreted and patient has worsening bibasilar opacities    PROCEDURES:  Critical Care performed: Yes, see critical care procedure note(s)  .Critical Care  Performed by: Vanessa Buckley, MD Authorized by: Vanessa Glenburn, MD   Critical care provider statement:    Critical care time (minutes):  30   Critical care was necessary to treat or prevent imminent or life-threatening deterioration of the following conditions:  Sepsis and respiratory failure   Critical care was time spent personally by me on the following activities:  Development of treatment plan with patient or surrogate, discussions with consultants, evaluation of patient's response to treatment, examination of patient, ordering and review of laboratory studies, ordering and review of radiographic studies, ordering and performing treatments and interventions, pulse oximetry, re-evaluation of patient's condition and review of old charts .1-3 Lead EKG Interpretation  Performed by: Vanessa Albemarle, MD Authorized by: Vanessa Ferry Pass, MD     Interpretation: normal     ECG rate:  90   ECG rate assessment: normal     Rhythm: sinus rhythm     Ectopy: none  Conduction: normal      MEDICATIONS ORDERED IN ED: Medications  ceFEPIme (MAXIPIME) 2 g in sodium chloride 0.9 % 100 mL IVPB (has no administration in time range)  metroNIDAZOLE (FLAGYL) IVPB 500 mg (has no administration in time range)  vancomycin (VANCOCIN) IVPB 1000 mg/200 mL premix (has no administration in time range)  lactated ringers bolus 1,000 mL (1,000 mLs Intravenous Bolus 10/11/22 0847)     IMPRESSION / MDM / ASSESSMENT AND PLAN / ED COURSE  I reviewed the triage vital signs and the nursing notes.   Patient's presentation is most consistent with acute presentation with  potential threat to life or bodily function.   Patient comes in found unresponsive with hypoxia and hypotension.  Patient given 1 L of fluid temperature here  Differential includes electrolyte abn, AKI, pneumonia, intracranial hemorrhage.  Initially CT scans were ordered with contrast but given patient is in new acute AKI we will do without contrast to start.  Troponin was slightly elevated COVID test was negative creatinine was elevated with new AKI.  Patient getting some fluids for that.  CT head was negative CT chest abdomen pelvis shows some atelectasis possible pneumonia.  Rectal exam was brown stool Hemoccult negative.  Patient does also have lung cancer and specimen oxygen at home so not sure if some of this could just be from her known lung cancer versus a pulmonary embolism.  She is not tachycardic her troponins have been stable I considered anticoagulating her but my concern is that her hemoglobin is already low at baseline and of the side effects of that.  I discussed this with Dr. Francine Graven as well and we have opted to hold off and wait for the ultrasound and D-dimer to come back and see if they can hydrate her to a point where we could potentially do a PE scan   When I rolled patient I did see a purplish colored discoloration to her buttock area with a tense bulla noted.  No crepitus just some induration noted CT scan without any evidence of free air.  Discussed the case with Dr. Hampton Abbot who will consult on but at this time it seems unlikely to be necrotizing fasciitis but he will continue to follow.  I discussed with hospital team for admission  The patient is on the cardiac monitor to evaluate for evidence of arrhythmia and/or significant heart rate changes.      FINAL CLINICAL IMPRESSION(S) / ED DIAGNOSES   Final diagnoses:  Acute respiratory failure with hypoxia (HCC)  Wound of buttock, unspecified laterality, initial encounter     Rx / DC Orders   ED Discharge Orders      None        Note:  This document was prepared using Dragon voice recognition software and may include unintentional dictation errors.   Vanessa Richfield, MD 10/11/22 [phone]

## 2022-10-11 NOTE — Consult Note (Signed)
PHARMACY -  BRIEF ANTIBIOTIC NOTE   Pharmacy has received consult(s) for Vancomycin and Cefepime from an ED provider.  The patient's profile has been reviewed for ht/wt/allergies/indication/available labs.    One time order(s) placed for Vancomycin 1g IV and Cefepime 2g IV x 1 dose each.  Further antibiotics/pharmacy consults should be ordered by admitting physician if indicated.                       Thank you, Pearla Dubonnet 10/11/2022  8:26 AM

## 2022-10-11 NOTE — H&P (Signed)
History and Physical    Patient: Jessica Stout YKD:[id number] DOB: Mar 16, 1951 DOA: 10/11/2022 DOS: the patient was seen and examined on 10/11/2022 PCP: Albina Billet, MD  Patient coming from: Home  Chief Complaint:  Chief Complaint  Patient presents with   Altered Mental Status    Most of the history is obtained from patient's husband who is at the bedside. HPI: Jessica Stout is a 71 y.o. female with medical history significant for stage IIIc colon cancer(patient noted to have a large colon mass which partially invaded the stomach as well as surrounding mesentery) status postsurgical resection on 05/23/22 at 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 Per patient's husband she is usually difficult to arouse but worse on the morning of her admission.  She had gone to bed in her recliner at about 6 PM and he tried to wake her up at about 7 AM and had difficulty arousing the patient so he called EMS. Per EMS patient was noted to be hypoxic on room air and hypotensive.  She had a blood pressure of 95/43 in the ER and received 2 L of IV fluids with improvement in her blood pressure. She opens eyes to loud verbal stimuli and is oriented to person and place but not to time. Oral intake has been poor according to her husband but she denies having any nausea, no vomiting, no diarrhea, no urinary symptoms, no headache, no blurred vision, no dizziness, no lightheadedness, no abdominal pain, no focal deficits. Per ER provider patient was hypoxic  Labs showed worsening renal function from baseline 1.06, 4 days ago to 2.15 on admission. She will be admitted to the hospital for further  evaluation    Review of Systems: As mentioned in the history of present illness. All other systems reviewed and are negative. Past Medical History:  Diagnosis Date   Anemia    Anxiety    Asthma    Cancer ([place])    colon   CHF (congestive heart failure) (HCC)    COPD (chronic obstructive pulmonary disease) ([place])    Diabetes mellitus without complication ([place])    Hyperlipidemia    Hypertension    Stroke [facility])    Past Surgical History:  Procedure Laterality Date   COLONOSCOPY WITH PROPOFOL N/A 02/03/2022   Procedure: COLONOSCOPY WITH PROPOFOL;  Surgeon: Lin Landsman, MD;  Location: [facility] ENDOSCOPY;  Service: Gastroenterology;  Laterality: N/A;   ESOPHAGOGASTRODUODENOSCOPY (EGD) WITH PROPOFOL N/A 02/03/2022   Procedure: ESOPHAGOGASTRODUODENOSCOPY (EGD) WITH PROPOFOL;  Surgeon: Lin Landsman, MD;  Location[facility] ENDOSCOPY;  Service: Gastroenterology;  Laterality: N/A;   GIVENS CAPSULE STUDY N/A 02/03/2022   Procedure: GIVENS CAPSULE STUDY;  Surgeon: Lin Landsman, MD;  Location: [facility] ENDOSCOPY;  Service: Gastroenterology;  Laterality: N/A;  possible Capsule Study   IR IMAGING GUIDED PORT INSERTION  06/18/2022   Social History:  reports that she has been smoking e-cigarettes. She does not have any smokeless tobacco history on file. She reports current alcohol use. No history on file for drug use.  Allergies  Allergen Reactions   Codeine Rash    Happened in 1970s    History reviewed. No pertinent family history.  Prior to Admission medications  Medication Sig Start Date End Date Taking? Authorizing Provider  atorvastatin (LIPITOR) 40 MG tablet Take 40 mg by mouth daily. 12/12/21  Yes [provider]  fenofibrate 54 MG tablet Take 54 mg by mouth daily. 10/04/22  Yes [provider]  furosemide (LASIX) 40 MG tablet Take 40 mg by mouth daily. 01/23/22  Yes [provider]  gabapentin (NEURONTIN) 600 MG tablet Take by mouth 2 (two) times daily.  01/01/22  Yes [provider]  losartan (COZAAR) 25 MG tablet Take 25 mg by mouth daily. 10/04/22  Yes [provider]  metoprolol succinate (TOPROL-XL) 25 MG 24 hr tablet Take 1 tablet (25 mg total) by mouth daily. 02/06/22 10/11/22 Yes Wyvonnia Dusky, MD  pantoprazole (PROTONIX) 40 MG tablet Take 1 tablet (40 mg total) by mouth daily. 03/11/22 10/11/22 Yes Hackney, Otila Kluver A, FNP  potassium chloride SA (KLOR-CON M) 20 MEQ tablet Take 1 tablet (20 mEq total) by mouth 2 (two) times daily. 10/07/22  Yes Lloyd Huger, MD  ranolazine (RANEXA) 500 MG 12 hr tablet Take 500 mg by mouth 2 (two) times daily.   Yes [provider]  sitaGLIPtin-metformin (JANUMET) 50-1000 MG tablet Take 1 tablet by mouth 2 (two) times daily with a meal.   Yes [provider]  venlafaxine XR (EFFEXOR-XR) 75 MG 24 hr capsule Take 75 mg by mouth daily. 12/07/21  Yes [provider]  acetaminophen (TYLENOL) 325 MG tablet Take 650 mg by mouth every 6 (six) hours as needed.    [provider]  albuterol (VENTOLIN HFA) 108 (90 Base) MCG/ACT inhaler Inhale into the lungs. 01/19/22   [provider]  insulin lispro (HUMALOG) 100 UNIT/ML injection Inject 1-10 Units into the skin 3 (three) times daily before meals. Per sliding scale    [provider]  LANTUS SOLOSTAR 100 UNIT/ML Solostar Pen Inject 12 Units into the skin at bedtime. 01/17/22   [provider]  oxyCODONE (OXY IR/ROXICODONE) 5 MG immediate release tablet Take 1 tablet (5 mg total) by mouth every 4 (four) hours as needed for severe pain. 07/29/22   Verlon Au, NP  prochlorperazine (COMPAZINE) 10 MG tablet Take 1 tablet (10 mg total) by mouth every 6 (six) hours as needed (Nausea or vomiting). 06/10/22 07/10/22  Lloyd Huger, MD    Physical Exam: Vitals:   10/11/22 1300 10/11/22 1315 10/11/22 1330 10/11/22 1345  BP: 102/60  126/85   Pulse: 71  69   Resp: 20 12  13   Temp:       TempSrc:      SpO2: 100% 100% 97% 100%  Weight:       Physical Exam Vitals and nursing note reviewed.  Constitutional:      Comments: Lethargic but arouses easily  HENT:     Head: Normocephalic and atraumatic.     Nose: Nose normal.     Mouth/Throat:     Mouth: Mucous membranes are dry.  Eyes:     Conjunctiva/sclera: Conjunctivae normal.  Cardiovascular:     Rate and Rhythm: Normal rate and regular rhythm.  Pulmonary:     Effort: Pulmonary effort is normal.     Breath sounds: Normal breath sounds.  Abdominal:     General: Abdomen is flat. Bowel sounds are normal.     Palpations: Abdomen is soft.  Musculoskeletal:        General: Normal range of motion.     Cervical back: Normal range of motion and neck supple.  Skin:    General: Skin is warm and dry.  Neurological:     Motor: Weakness present.  Psychiatric:        Mood and Affect: Mood normal.        Behavior: Behavior normal.     Data Reviewed: Relevant notes from primary care and specialist visits, past discharge summaries as available in EHR, including Care Everywhere. Prior diagnostic testing as pertinent to current admission diagnoses Updated medications and problem lists for reconciliation ED course, including vitals, labs, imaging, treatment and response to treatment Triage notes, nursing and pharmacy notes and ED provider's notes Notable results as noted in HPI Labs reviewed.  Troponin 21 >> 23, BNP 118, sodium 140, potassium 4.6, chloride 100, bicarb 30, glucose 127, BUN 39, creatinine 2.15, calcium 7.6, total protein 6.4, albumin 2.9, AST 19, ALT 10, alkaline phosphatase 49, total bilirubin 0.8, PT 14.3, INR 1.1, white count 6.5, hemoglobin 8.5, hematocrit 28, platelet count 155 Chest x-ray reviewed by me shows streaky airspace opacities in bilateral lower lobes, left greater than right, may represent atelectasis versus airspace consolidation. Mild pulmonary vascular congestion. CT scan of the head without  contrast shows no acute intracranial abnormality. 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 CT scan of chest/abdomen and pelvis shows there are new areas of lung opacity, at the lung bases, mostly in the lower lobes, and peripheral aspects the upper and right middle lobes. These areas may all be atelectasis. Consider pneumonia if there are consistent clinical findings. Small linear opacity in the left lower lobe superior segment, the larger nodular opacity on prior studies, is stable from the most recent prior exam. Stable ill-defined 4 mm left upper lobe nodule. No new lung nodules. No lung masses. Colon surgery has been performed since the prior study. Previously seen transverse colon mass is been resected. There new stomach and bowel anastomosis staples. No evidence of bowel obstruction or inflammation. Stable left adrenal mass containing macroscopic fat. 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 No other evidence of metastatic disease within the abdomen or pelvis. No acute abnormalities within the abdomen or pelvis. Twelve-lead EKG reviewed by me shows sinus rhythm with low voltage QRS. There are no new results to review at this time.  Assessment and Plan: * AKI (acute kidney injury) ([place]) At baseline patient has a serum creatinine of 1.06 and today on admission it is 2.15 Patient was also hypotensive on admission and this improved with IV fluid hydration Hold Cozaar and furosemide for now. Repeat renal parameters in a.m.  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 Follow-up with oncology as an outpatient  Chronic systolic CHF (congestive heart failure) (HCC) Last known LVEF of 45 to 50% from a 2D echocardiogram which was done 3/23 Hold furosemide and Cozaar due to AKI Hold metoprolol due to relative hypotension  Pressure injury of buttock, unstageable ([place]) Patient noted to have a deep tissue injury involving the right buttock (present on admission) Patient will need frequent turning to prevent worsening of deep tissue injury We will consult surgery for further recommendation Wound care consult  Colon cancer [facility]) Status postsurgical resection and currently on chemotherapy Follow-up with oncology as an outpatient   Anxiety and depression Continue venlafaxine  Chronic obstructive pulmonary disease ([place]) With chronic respiratory failure on 2 L of oxygen Stable and not acutely exacerbated Continue as needed bronchodilator therapy Continue oxygen supplementation to maintain pulse oximetry greater than 92%.  Diabetes mellitus type 2, controlled, without complications ([place]) Place patient on a consistent carbohydrate diet Continue long-acting insulin Place patient on NovoLog sliding scale coverage Hold metformin and Sitagliptin      Advance Care Planning:   Code Status: Full Code   Consults: Oncology  Family Communication: Greater than 50% of time was spent discussing patient's condition and plan of care with her and her husband at the bedside.  All questions and concerns have been addressed.  They verbalized understanding and agree with the plan.  CODE STATUS was discussed with patient and she wishes to be a full code.  She lists her husband as her healthcare power of attorney.  Severity of Illness: The  appropriate patient status for this patient is INPATIENT. Inpatient status is judged to be reasonable and necessary in order to provide the required intensity of service to ensure the patient's safety. The patient's presenting symptoms, physical exam findings, and initial radiographic and laboratory data in the context of their chronic comorbidities is felt to place them at high risk for further clinical deterioration. Furthermore, it is not anticipated that the patient will be medically stable for discharge from the hospital within 2 midnights of admission.   * I certify that at the point of admission it is my clinical judgment that the patient will require inpatient hospital care spanning beyond 2 midnights from the point of admission due to high intensity of service, high risk for further deterioration and high frequency of surveillance required.*  Author: Collier Bullock, MD 10/11/2022 2:00 PM  For on call review [email or web address].

## 2022-10-11 NOTE — ED Notes (Signed)
Pt resting calmly on stretcher; husband remains at bedside.

## 2022-10-11 NOTE — ED Notes (Signed)
[place] staff done at bedside.

## 2022-10-11 NOTE — ED Notes (Addendum)
Pt given water and called dietary for food tray since none currently noted in ED fridge. Dietary states there is a tray on way up for pt now.

## 2022-10-11 NOTE — ED Notes (Signed)
Attempting to collect urine via I&O cath now with [facility] NT assisting.

## 2022-10-11 NOTE — Assessment & Plan Note (Addendum)
Patient noted to have a deep tissue injury involving the right buttock (present on admission) Patient will need frequent turning to prevent worsening of deep tissue injury We will consult surgery for further recommendation Wound care consult

## 2022-10-11 NOTE — Consult Note (Signed)
CODE SEPSIS - PHARMACY COMMUNICATION  **Broad Spectrum Antibiotics should be administered within 1 hour of Sepsis diagnosis**  Time Code Sepsis Called/Page Received: 0817  Antibiotics Ordered: cefepime, vanc, flagyl  Time of 1st antibiotic administration: [phone]  Additional action taken by pharmacy: none  If necessary, Name of Provider/Nurse Contacted: n/a    Pearla Dubonnet ,PharmD Clinical Pharmacist  10/11/2022  9:16 AM

## 2022-10-11 NOTE — ED Triage Notes (Signed)
Pt arrives by [facility] from home. Pt was found by her husband unresponsive. Upon EMS arrival, pt noted to be hypoxic and hypotensive.

## 2022-10-11 NOTE — ED Notes (Signed)
Pt changed into gown by this tech. Pt also placed on purewick with a brief and a chux pad underneath. Pt has a pillow underneath her, which she came in with from home, with EMS. Pt states she uses this pillow due to a sore spot on her buttocks area and would like to continue to sit on it at this time. Pt moved up in the bed by this tech and Opal Sidles, Therapist, sports. Pt given call bell incase of needs. Pt husband at bedside. Pt given blanket.

## 2022-10-12 DIAGNOSIS — G9341 Metabolic encephalopathy: Secondary | ICD-10-CM | POA: Diagnosis not present

## 2022-10-12 DIAGNOSIS — N179 Acute kidney failure, unspecified: Secondary | ICD-10-CM

## 2022-10-12 DIAGNOSIS — L89322 Pressure ulcer of left buttock, stage 2: Secondary | ICD-10-CM | POA: Diagnosis not present

## 2022-10-12 LAB — URINE CULTURE: Culture: NO GROWTH

## 2022-10-12 LAB — GLUCOSE, CAPILLARY
Glucose-Capillary: 158 mg/dL — ABNORMAL HIGH (ref 70–99)
Glucose-Capillary: 220 mg/dL — ABNORMAL HIGH (ref 70–99)
Glucose-Capillary: 151 mg/dL — ABNORMAL HIGH (ref 70–99)
Glucose-Capillary: 185 mg/dL — ABNORMAL HIGH (ref 70–99)

## 2022-10-12 LAB — CBC
HCT: 28.2 % — ABNORMAL LOW (ref 36.0–46.0)
Hemoglobin: 8.6 g/dL — ABNORMAL LOW (ref 12.0–15.0)
MCH: 27.2 pg (ref 26.0–34.0)
MCHC: 30.5 g/dL (ref 30.0–36.0)
MCV: 89.2 fL (ref 80.0–100.0)
Platelets: 120 10*3/uL — ABNORMAL LOW (ref 150–400)
RBC: 3.16 MIL/uL — ABNORMAL LOW (ref 3.87–5.11)
RDW: 18.2 % — ABNORMAL HIGH (ref 11.5–15.5)
WBC: 5.6 10*3/uL (ref 4.0–10.5)
nRBC: 0 % (ref 0.0–0.2)

## 2022-10-12 LAB — BASIC METABOLIC PANEL
Anion gap: 5 (ref 5–15)
BUN: 31 mg/dL — ABNORMAL HIGH (ref 8–23)
CO2: 29 mmol/L (ref 22–32)
Calcium: 7.7 mg/dL — ABNORMAL LOW (ref 8.9–10.3)
Chloride: 103 mmol/L (ref 98–111)
Creatinine, Ser: 1 mg/dL (ref 0.44–1.00)
GFR, Estimated: 59 mL/min — ABNORMAL LOW (ref >60)
Glucose, Bld: 161 mg/dL — ABNORMAL HIGH (ref 70–99)
Potassium: 3.9 mmol/L (ref 3.5–5.1)
Sodium: 137 mmol/L (ref 135–145)

## 2022-10-12 MED ORDER — PIPERACILLIN-TAZOBACTAM 3.375 G IVPB
3.3750 g | Freq: Three times a day (TID) | INTRAVENOUS | Status: DC
  Administered 2022-10-12 – 2022-10-13 (×3): 3.375 g via INTRAVENOUS
  Filled 2022-10-12 (×3): qty 50

## 2022-10-12 MED ORDER — ENOXAPARIN SODIUM 40 MG/0.4ML IJ SOSY
40.0000 mg | PREFILLED_SYRINGE | INTRAMUSCULAR | Status: DC
  Administered 2022-10-12: 40 mg via SUBCUTANEOUS
  Filled 2022-10-12: qty 0.4

## 2022-10-12 NOTE — Evaluation (Signed)
Physical Therapy Evaluation Patient Details Name: Jessica Stout MRN: [id number] DOB: 05-25-1951 Today's Date: 10/12/2022  History of Present Illness  71 y.o. 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  Presents to ED via EMS for evaluation of decreased responsiveness.  Clinical Impression  Pt showed good effort and willingness to participate with PT. She states that she does try to stay active around the home, but encouraged regularly scheduled movement/walking/etc as she does have some sacral pressure injury.  Pt was able to circumambulate the nurses' station, mostly needing AD but going ~30 ft w/o AD - minimal usnteadiness with no LOBs.  Trial of ~30 ft w/o O2, sats dropped quickly to the mid 80s, up to mid 90s after ~1 minute on 2L. Will benefit from PT to address functional deficits listed below and to facilitate mobility and safe d/c.       Recommendations for follow up therapy are one component of a multi-disciplinary discharge planning process, led by the attending physician.  Recommendations may be updated based on patient status, additional functional criteria and insurance authorization.  Follow Up Recommendations Home health PT      Assistance Recommended at Discharge Set up Supervision/Assistance  Patient can return home with the following  Assistance with cooking/housework;Help with stairs or ramp for entrance;Assist for transportation;A little help with walking and/or transfers    Equipment  Recommendations None recommended by PT  Recommendations for Other Services       Functional Status Assessment Patient has had a recent decline in their functional status and demonstrates the ability to make significant improvements in function in a reasonable and predictable amount of time.     Precautions / Restrictions Precautions Precautions: Fall Restrictions Weight Bearing Restrictions: No      Mobility  Bed Mobility               General bed mobility comments: in recliner pre/post session    Transfers Overall transfer level: Modified independent Equipment used: Rolling walker (2 wheels), None               General transfer comment: UEs required to rise and to control descent, multiple sit to stand and did not require AD to maintain standing balance once up    Ambulation/Gait Ambulation/Gait assistance: Supervision Gait Distance (Feet): 200 Feet Assistive device: Rolling walker (2 wheels), None         General Gait Details: Pt able to ambulate into the hallway and around nurses station relatively well.  She did have drop in O2 to mid 80s trying to do so w/o O2, back to mid 90s on 2L.  She was able to ambulate ~30 ft w/o AD, but did have some mild unsteadiness prompting return to RW which made gait more consistent and steady.  Stairs            Wheelchair Mobility    Modified Rankin (Stroke Patients Only)       Balance  Pertinent Vitals/Pain Pain Assessment Pain Assessment:  (minimal chronic L knee pain)    Home Living Family/patient expects to be discharged to:: Private residence Living Arrangements: Spouse/significant other Available Help at Discharge: Family Type of Home: House Home Access: Stairs to enter Entrance Stairs-Rails: None (husband reports he is going to put rails up) Entrance Stairs-Number of Steps: 3     Home Equipment: Rollator (4 wheels);Cane - single  point      Prior Function Prior Level of Function : Independent/Modified Independent             Mobility Comments: Reports she is able to be active in the home, generally w/o AD.  Not out of the home much apart from medical appointments.  She did endorse h/o falls, but none in the last 6 months. ADLs Comments: husband can help as needed, but she reports dressing, bathing, light cooking, etc     Hand Dominance        Extremity/Trunk Assessment   Upper Extremity Assessment Upper Extremity Assessment: Overall WFL for tasks assessed;Generalized weakness    Lower Extremity Assessment Lower Extremity Assessment: Overall WFL for tasks assessed;Generalized weakness (L knee ext and ankle PF 4-/5, otherwise WFL t/o)       Communication   Communication: No difficulties  Cognition Arousal/Alertness: Awake/alert Behavior During Therapy: WFL for tasks assessed/performed Overall Cognitive Status: Within Functional Limits for tasks assessed                                          General Comments General comments (skin integrity, edema, etc.): Pt reports a little more fatigue than baseline, but ultimately did well    Exercises     Assessment/Plan    PT Assessment Patient needs continued PT services  PT Problem List Decreased strength;Decreased balance;Decreased activity tolerance;Decreased safety awareness;Decreased knowledge of use of DME;Cardiopulmonary status limiting activity       PT Treatment Interventions DME instruction;Gait training;Stair training;Functional mobility training;Therapeutic activities;Therapeutic exercise;Balance training;Patient/family education    PT Goals (Current goals can be found in the Care Plan section)  Acute Rehab PT Goals Patient Stated Goal: go home PT Goal Formulation: With patient/family Time For Goal Achievement: 10/25/22 Potential to Achieve Goals: Good    Frequency Min 2X/week     Co-evaluation                AM-PAC PT "6 Clicks" Mobility  Outcome Measure Help needed turning from your back to your side while in a flat bed without using bedrails?: None Help needed moving from lying on your back to sitting on the side of a flat bed without using bedrails?: None Help needed moving to and from a bed to a chair (including a wheelchair)?: None Help needed standing up from a chair using your arms (e.g., wheelchair or bedside chair)?: None Help needed to walk in hospital room?: A Little Help needed climbing 3-5 steps with a railing? : A Little 6 Click Score: 22    End of Session Equipment Utilized During Treatment: Gait belt;Oxygen (2L) Activity Tolerance: Patient limited by fatigue;Patient tolerated treatment well Patient left: with chair alarm set;with call bell/phone within reach;with family/visitor present Nurse Communication: Mobility status (O2 requirement) PT Visit Diagnosis: Muscle weakness (generalized) (M62.81);Difficulty in walking, not elsewhere classified (R26.2)    Time: 0354-6568 PT Time Calculation (min) (ACUTE ONLY): 27 min   Charges:   PT Evaluation $PT Eval  Low Complexity: 1 Low PT Treatments $Gait Training: 8-22 mins        Kreg Shropshire, DPT 10/12/2022, 12:40 PM

## 2022-10-12 NOTE — TOC Initial Note (Signed)
Transition of Care [facility]) - Initial/Assessment Note    Patient Details  Name: Jessica Stout MRN: [id number] Date of Birth: 1951/08/15  [facility] CM/SW Contact:    Candie Chroman, LCSW Phone Number: 10/12/2022, 3:10 PM  Clinical Narrative:  CSW met with patient. No supports at bedside. CSW introduced role and explained that PT recommendations would be discussed. Patient declined home health due to having a lot going on right now. Encouraged her to notify TOC if she changes her mind prior to discharge and her PCP if she changes her mind after discharge. No further concerns. CSW will continue to follow patient for support and facilitate return home once stable.         Expected Discharge Plan: Home/Self Care Barriers to Discharge: Continued Medical Work up   Patient Goals and CMS Choice        Expected Discharge Plan and Services Expected Discharge Plan: Home/Self Care     Post Acute Care Choice: NA Living arrangements for the past 2 months: Single Family Home                                      Prior Living Arrangements/Services Living arrangements for the past 2 months: Single Family Home Lives with:: Spouse Patient language and need for interpreter reviewed:: Yes Do you feel safe going back to the place where you live?: Yes      Need for Family Participation in Patient Care: Yes (Comment) Care giver support system in place?: Yes (comment)   Criminal Activity/Legal Involvement Pertinent to Current Situation/Hospitalization: No - Comment as needed  Activities of Daily Living Home Assistive Devices/Equipment: Gilford Rile (specify type) ADL Screening (condition at time of admission) Patient's cognitive ability adequate to safely complete daily activities?: Yes Is the patient deaf or have difficulty hearing?: Yes Does the patient have difficulty seeing, even when wearing glasses/contacts?: No Does the patient have difficulty concentrating, remembering, or  making decisions?: No Patient able to express need for assistance with ADLs?: Yes Does the patient have difficulty dressing or bathing?: No Independently performs ADLs?: Yes (appropriate for developmental age) Does the patient have difficulty walking or climbing stairs?: Yes Weakness of Legs: Both Weakness of Arms/Hands: None  Permission Sought/Granted                  Emotional Assessment Appearance:: Appears stated age Attitude/Demeanor/Rapport: Engaged, Gracious Affect (typically observed): Accepting, Appropriate, Calm, Pleasant Orientation: : Oriented to Self, Oriented to Place, Oriented to  Time, Oriented to Situation Alcohol / Substance Use: Not Applicable Psych Involvement: No (comment)  Admission diagnosis:  Acute respiratory failure with hypoxia ([place]) [J96.01] AKI (acute kidney injury) ([place]) [N17.9] Wound of buttock, unspecified laterality, initial encounter [S31.809A] Patient Active Problem List   Diagnosis Date Noted   AKI (acute kidney injury) ([place]) 10/11/2022   Pressure injury of buttock, unstageable ([place]) 38/93/7342   Chronic systolic CHF (congestive heart failure) ([place]) 87/68/1157   Acute metabolic encephalopathy 26/20/3559   Lung cancer ([place]) 10/11/2022   Pressure injury of left buttock, stage 2 ([place]) 10/11/2022   Nonspecific abnormal results of pulmonary system function study 03/19/2022   Adjustment disorder with depressed mood 03/19/2022   Arthritis 03/19/2022   Colon cancer ([place]) 02/04/2022   Duodenal ulcer without hemorrhage or perforation and without obstruction 02/03/2022   Erosive gastropathy 02/03/2022   Colonic mass 02/03/2022   Hypokalemia 02/01/2022   Diabetes mellitus type  2, controlled, without complications ([place]) 25/04/3975   Chronic obstructive pulmonary disease ([place]) 01/29/2022   New onset of HFrEF 01/29/2022   Hyperlipidemia 01/29/2022   Anxiety and depression 01/29/2022   Iron deficiency anemia 01/29/2022   Acute respiratory failure  with hypoxia ([place])    PCP:  Albina Billet, MD Pharmacy:   [address], [place] [address] Phone: [phone] Fax: [phone]     Social Determinants of Health (SDOH) Interventions    Readmission Risk Interventions     No data to display

## 2022-10-12 NOTE — Consult Note (Signed)
[place]  [phone] [phone]  ID: Jerzy Crotteau OB: 03/23/1951  MR#: [id number]  HOZ#:[id number]  Patient Care Team: Albina Billet, MD as PCP - General (Internal Medicine) Clent Jacks, RN as Oncology Nurse Navigator  CHIEF COMPLAINT: Colon cancer, admitted with AMS and acute renal failure.  INTERVAL HISTORY: Patient is a 71 year old female actively receiving chemotherapy for colon cancer who was recently admitted to the hospital with altered mental status and acute renal failure.  Patient admits to a poor appetite, increased weakness and fatigue, and decreased performance status, but otherwise has felt well.  She is now currently alert and oriented.  She has no neurologic complaints.  She denies any recent fevers.  She has a poor appetite.  She has no chest pain, shortness of breath, cough, or hemoptysis.  She denies any nausea, vomiting, constipation, or diarrhea.  She has no melena or hematochezia.  She has no urinary complaints.  Patient offers no further specific complaints today.  REVIEW OF SYSTEMS:   Review of Systems  Constitutional:  Positive for malaise/fatigue. Negative for fever and weight loss.  Respiratory: Negative.  Negative for cough, hemoptysis and shortness of breath.   Cardiovascular: Negative.  Negative for chest pain and leg swelling.  Gastrointestinal: Negative.  Negative for abdominal pain, blood in stool, heartburn, melena and nausea.  Genitourinary: Negative.  Negative for dysuria.  Musculoskeletal: Negative.  Negative for back pain.  Skin: Negative.  Negative for rash.  Neurological:  Positive for weakness. Negative for dizziness, focal weakness and headaches.  Psychiatric/Behavioral: Negative.  The patient is not nervous/anxious.     As per HPI. Otherwise, a complete review of systems is negative.  PAST MEDICAL HISTORY: Past Medical History:  Diagnosis Date   Anemia    Anxiety    Asthma    Cancer ([place])     colon   CHF (congestive heart failure) (HCC)    COPD (chronic obstructive pulmonary disease) ([place])    Diabetes mellitus without complication ([place])    Hyperlipidemia    Hypertension    Stroke ([place])     PAST SURGICAL HISTORY: Past Surgical History:  Procedure Laterality Date   COLONOSCOPY WITH PROPOFOL N/A 02/03/2022   Procedure: COLONOSCOPY WITH PROPOFOL;  Surgeon: Lin Landsman, MD;  Location: [facility] ENDOSCOPY;  Service: Gastroenterology;  Laterality: N/A;   ESOPHAGOGASTRODUODENOSCOPY (EGD) WITH PROPOFOL N/A 02/03/2022   Procedure: ESOPHAGOGASTRODUODENOSCOPY (EGD) WITH PROPOFOL;  Surgeon: Lin Landsman, MD;  Location: [facility] ENDOSCOPY;  Service: Gastroenterology;  Laterality: N/A;   GIVENS CAPSULE STUDY N/A 02/03/2022   Procedure: GIVENS CAPSULE STUDY;  Surgeon: Lin Landsman, MD;  Location: [facility] ENDOSCOPY;  Service: Gastroenterology;  Laterality: N/A;  possible Capsule Study   IR IMAGING GUIDED PORT INSERTION  06/18/2022    FAMILY HISTORY: History reviewed. No pertinent family history.  ADVANCED DIRECTIVES (Y/N):  @ADVDIR @  HEALTH MAINTENANCE: Social History   Tobacco Use   Smoking status: Every Day    Types: E-cigarettes  Substance Use Topics   Alcohol use: Yes    Comment: rare     Colonoscopy:  PAP:  Bone density:  Lipid panel:  Allergies  Allergen Reactions   Codeine Rash    Happened in 1970s    Current Facility-Administered Medications  Medication Dose Route Frequency Provider Last Rate Last Admin   acetaminophen (TYLENOL) tablet 650 mg  650 mg Oral Q6H PRN Agbata, Tochukwu, MD       albuterol (PROVENTIL,VENTOLIN) solution continuous neb  3 mL Inhalation  Q6H PRN Agbata, Tochukwu, MD       atorvastatin (LIPITOR) tablet 40 mg  40 mg Oral Daily Agbata, Tochukwu, MD   40 mg at 10/12/22 0848   enoxaparin (LOVENOX) injection 30 mg  30 mg Subcutaneous Q24H Agbata, Tochukwu, MD   30 mg at 10/11/22 2113   gabapentin (NEURONTIN) capsule 200 mg  200 mg Oral BID  Agbata, Tochukwu, MD   200 mg at 10/12/22 0848   insulin aspart (novoLOG) injection 0-15 Units  0-15 Units Subcutaneous TID WC Agbata, Tochukwu, MD   3 Units at 10/12/22 0848   insulin glargine-yfgn (SEMGLEE) injection 10 Units  10 Units Subcutaneous QHS Agbata, Tochukwu, MD   10 Units at 10/11/22 2114   ondansetron (ZOFRAN) tablet 4 mg  4 mg Oral Q6H PRN Agbata, Tochukwu, MD       Or   ondansetron (ZOFRAN) injection 4 mg  4 mg Intravenous Q6H PRN Agbata, Tochukwu, MD       pantoprazole (PROTONIX) EC tablet 40 mg  40 mg Oral Daily Agbata, Tochukwu, MD   40 mg at 10/12/22 0848   venlafaxine XR (EFFEXOR-XR) 24 hr capsule 75 mg  75 mg Oral Daily Agbata, Tochukwu, MD   75 mg at 10/12/22 0847   Facility-Administered Medications Ordered in Other Encounters  Medication Dose Route Frequency Provider Last Rate Last Admin   sodium chloride flush (NS) 0.9 % injection 10 mL  10 mL Intracatheter PRN Lloyd Huger, MD   10 mL at 10/09/22 1353    OBJECTIVE: Vitals:   10/12/22 0546 10/12/22 0727  BP: (!) 153/79 138/73  Pulse: 98 90  Resp: 18 20  Temp: 97.8 F (36.6 C) 98.7 F (37.1 C)  SpO2: 92% 96%     Body mass index is 24.15 kg/m.    ECOG FS:3 - Symptomatic, >50% confined to bed  General: Well-developed, well-nourished, no acute distress. Eyes: Pink conjunctiva, anicteric sclera. HEENT: Normocephalic, moist mucous membranes. Lungs: No audible wheezing or coughing. Heart: Regular rate and rhythm. Abdomen: Soft, nontender, no obvious distention. Musculoskeletal: No edema, cyanosis, or clubbing. Neuro: Alert, answering all questions appropriately. Cranial nerves grossly intact. Skin: No rashes or petechiae noted. Psych: Normal affect. Lymphatics: No cervical, calvicular, axillary or inguinal LAD.  LAB RESULTS:  Lab Results  Component Value Date   NA 137 10/12/2022   K 3.9 10/12/2022   CL 103 10/12/2022   CO2 29 10/12/2022   GLUCOSE 161 (H) 10/12/2022   BUN 31 (H) 10/12/2022    CREATININE 1.00 10/12/2022   CALCIUM 7.7 (L) 10/12/2022   PROT 6.4 (L) 10/11/2022   ALBUMIN 2.9 (L) 10/11/2022   AST 19 10/11/2022   ALT 10 10/11/2022   ALKPHOS 49 10/11/2022   BILITOT 0.8 10/11/2022   GFRNONAA 59 (L) 10/12/2022    Lab Results  Component Value Date   WBC 5.6 10/12/2022   NEUTROABS 5.4 10/11/2022   HGB 8.6 (L) 10/12/2022   HCT 28.2 (L) 10/12/2022   MCV 89.2 10/12/2022   PLT 120 (L) 10/12/2022     STUDIES: US Venous Img Lower Bilateral  Result Date: 10/11/2022 CLINICAL DATA:  Leg pain. EXAM: BILATERAL LOWER EXTREMITY VENOUS DOPPLER ULTRASOUND TECHNIQUE: Gray-scale sonography with compression, as well as color and duplex ultrasound, were performed to evaluate the deep venous system(s) from the level of the common femoral vein through the popliteal and proximal calf veins. COMPARISON:  None Available. FINDINGS: VENOUS Normal compressibility of the common femoral, superficial femoral, and popliteal veins, as well as  the visualized calf veins. Visualized portions of profunda femoral vein and great saphenous vein unremarkable. No filling defects to suggest DVT on grayscale or color Doppler imaging. Doppler waveforms show normal direction of venous flow, normal respiratory plasticity and response to augmentation. Limited views of the contralateral common femoral vein are unremarkable. OTHER None. Limitations: none IMPRESSION: Negative. Electronically Signed   By: Keane Police D.O.   On: 10/11/2022 13:17   CT CHEST ABDOMEN PELVIS WO CONTRAST  Result Date: 10/11/2022 CLINICAL DATA:  Abdominal pain.  History of colon carcinoma. EXAM: CT CHEST, ABDOMEN AND PELVIS WITHOUT CONTRAST TECHNIQUE: Multidetector CT imaging of the chest, abdomen and pelvis was performed following the standard protocol without IV contrast. RADIATION DOSE REDUCTION: This exam was performed according to the departmental dose-optimization program which includes automated exposure control, adjustment of the mA  and/or kV according to patient size and/or use of iterative reconstruction technique. COMPARISON:  08/29/2022, CT chest. CT abdomen pelvis, 02/03/2022. PET-CT, 02/27/2022. FINDINGS: CT CHEST FINDINGS Cardiovascular: Heart mildly enlarged. Three-vessel coronary artery calcifications. No pericardial effusion. Great vessels are normal in caliber. Mild aortic atherosclerosis. Mediastinum/Nodes: No neck base, mediastinal or hilar masses or enlarged lymph nodes. Trachea and esophagus are unremarkable. Lungs/Pleura: Bilateral lung base opacities, mostly in the lower lobes. Additional smaller peripheral opacities are noted in both upper lobes and in the right middle lobe, findings new since the prior CT. Small stable focus of linear type opacity in the posterior left lower lobe, superior segment. Stable 4 mm nodule, posterior left upper lobe, image 45, series 4. Mild changes of centrilobular emphysema. No evidence of pulmonary edema. No pleural effusion or pneumothorax. No discrete lung mass or new nodule. Musculoskeletal: No fracture or acute finding. No osteoblastic or osteolytic lesions. No chest wall mass. CT ABDOMEN PELVIS FINDINGS Hepatobiliary: Liver normal in overall size. Small low-density lesion, inferior aspect of segment 6, stable and consistent with a cyst. No other liver masses or lesions. Single dependent gallstone. No evidence of acute cholecystitis. No bile duct dilation. Pancreas: Unremarkable. No pancreatic ductal dilatation or surrounding inflammatory changes. Spleen: Normal in size without focal abnormality. Adrenals/Urinary Tract: Heterogeneous left adrenal mass, containing macroscopic fat, 3.1 cm in size, stable. Stable mild right adrenal gland thickening. Stable cyst from the upper pole the right kidney. No other renal masses, no stones and no hydronephrosis. Normal ureters. Bladder is decompressed, otherwise unremarkable. Stomach/Bowel: Bowel anastomosis staples noted along the anterior and inferior  aspect of the stomach. No stomach mass or wall thickening. Stomach is mostly decompressed. Small bowel and colonic anastomosis staples and adjacent vascular clips are new since the prior exam. No evidence of bowel obstruction or inflammation. No bowel mass. Vascular/Lymphatic: Dense aortic atherosclerosis. No aneurysm. Prominent gastrohepatic ligament lymph nodes measuring up to 9 mm in short axis. No enlarged intraperitoneal, retroperitoneal or pelvic lymph nodes. Reproductive: Unremarkable. Other: Radiopaque mesh extends along the anterior abdominal wall. Midline incision, well-healed. No abdominal wall mass. No hernia. No ascites. Musculoskeletal: No fracture or acute finding.  No bone lesion. IMPRESSION: 1. There are new areas of lung opacity, at the lung bases, mostly in the lower lobes, and peripheral aspects the upper and right middle lobes. These areas may all be atelectasis. Consider pneumonia if there are consistent clinical findings. 2. Small linear opacity in the left lower lobe superior segment, the larger nodular opacity on prior studies, is stable from the most recent prior exam. Stable ill-defined 4 mm left upper lobe nodule. No new lung nodules. No lung masses.  3. Colon surgery has been performed since the prior study. Previously seen transverse colon mass is been resected. There new stomach and bowel anastomosis staples. No evidence of bowel obstruction or inflammation. 4. Stable left adrenal mass containing macroscopic fat. 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 No other evidence of metastatic disease within the abdomen or pelvis. 5. No acute abnormalities within the abdomen or pelvis. Electronically Signed   By: Lajean Manes M.D.   On: 10/11/2022 12:00   CT HEAD WO CONTRAST (5MM)  Result Date: 10/11/2022 CLINICAL DATA:  Head trauma.  Found unresponsive. EXAM: CT HEAD WITHOUT CONTRAST TECHNIQUE: Contiguous axial images  were obtained from the base of the skull through the vertex without intravenous contrast. RADIATION DOSE REDUCTION: This exam was performed according to the departmental dose-optimization program which includes automated exposure control, adjustment of the mA and/or kV according to patient size and/or use of iterative reconstruction technique. COMPARISON:  None Available. FINDINGS: Brain: There is no evidence for acute hemorrhage, hydrocephalus, mass lesion, or abnormal extra-axial fluid collection. No definite CT evidence for acute infarction. Vascular: No hyperdense vessel or unexpected calcification. Skull: No evidence for fracture. No worrisome lytic or sclerotic lesion. Sinuses/Orbits: Mucosal thickening with air-fluid level noted right sphenoid sinus. Maxillary, frontal, and mastoid sinuses are clear. Other: None. IMPRESSION: 1. No acute intracranial abnormality. 2. 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 Electronically Signed   By: Misty Stanley M.D.   On: 10/11/2022 11:46   DG Chest Port 1 View  Result Date: 10/11/2022 CLINICAL DATA:  Questionable sepsis - evaluate for abnormality EXAM: PORTABLE CHEST 1 VIEW COMPARISON:  10/07/2022 FINDINGS: Right Port-A-Cath remains in place, unchanged. Heart is normal size. Bibasilar airspace opacities, favor atelectasis. Possible small bilateral effusions. No acute bony abnormality. IMPRESSION: Worsening bibasilar opacities, likely atelectasis. Question small bilateral effusions. Electronically Signed   By: Rolm Baptise M.D.   On: 10/11/2022 08:27   DG Chest 2 View  Result Date: 10/10/2022 CLINICAL DATA:  Shortness of breath and ankle swelling. EXAM: CHEST - 2 VIEW COMPARISON:  July 22, 2022 FINDINGS: Injectable port in stable position. Cardiomediastinal silhouette is normal. Mediastinal contours appear intact. Streaky airspace opacities in bilateral lower lobes, left greater than right. Mild pulmonary vascular congestion. Osseous structures are without  acute abnormality. Soft tissues are grossly normal. IMPRESSION: Streaky airspace opacities in bilateral lower lobes, left greater than right, may represent atelectasis versus airspace consolidation. Mild pulmonary vascular congestion. Electronically Signed   By: Fidela Salisbury M.D.   On: 10/10/2022 17:53    ASSESSMENT: Colon cancer, admitted with AMS and acute renal failure.  PLAN:    Stage III colon cancer: Patient last received chemotherapy with FOLFOX on October 07, 2022.  Her next scheduled treatment is not until October 20, 2022.  CT scan results from October 11, 2022 reviewed independently and report as above with no obvious evidence of recurrent or progressive disease. Acute renal failure: Resolved.  Suspect secondary to poor p.o. intake and dehydration. Altered mental status: Likely multifactorial.  Resolved.  CT of the head is negative. Anemia: Multifactorial including secondary to chemotherapy.  Patient most recent hemoglobin was 8.6.  Continue to monitor and transfuse if patient falls below 8.0. Thrombocytopenia: Secondary to chemotherapy.  Monitor. Pressure ulcer: Appreciate surgical input.  Patient will likely require home health as well as home physical therapy upon discharge. Disposition: Okay to discharge from an oncology standpoint.  Although patient's next chemotherapy is not until  November 20th, will arrange follow-up later this week for laboratory work and further evaluation.  Appreciate consult, will follow.    Lloyd Huger, MD   10/12/2022 9:13 AM    Awake?

## 2022-10-12 NOTE — Progress Notes (Signed)
PROGRESS NOTE    Jessica Stout  MHD:[id number] DOB: 05/31/1951 DOA: 10/11/2022 PCP: Albina Billet, MD  [id number]/[id number]-AA  LOS: 1 day   Brief hospital course:   Assessment & Plan: Jessica Stout is a 71 y.o. 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  Per patient's husband she is usually difficult to arouse but worse on the morning of her admission.  She had gone to bed in her recliner at about 6 PM and he tried to wake her up at about 7 AM and had difficulty arousing the patient so he called EMS. Per EMS patient was noted to be hypoxic on room air and hypotensive.  She had a blood pressure of 95/43 in the ER and received 2 L of IV fluids with improvement in her blood pressure.   * Acute metabolic encephalopathy --possible due to gabapentin in the setting of AKI (reduced kidney clearance).   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 Follow-up with oncology as an outpatient  Chronic systolic CHF (congestive heart failure) (HCC) Last known LVEF of 45 to 50% from a 2D echocardiogram which was done 3/23 Hold furosemide and Cozaar due to AKI Hold metoprolol due to relative hypotension   Pressure injury of buttock, unstageable ([place]) Patient noted to have a  deep tissue injury involving the right buttock (present on admission) --due to sitting and sleeping in recliner for prolonged periods of time --GenSurg consulted, rec IV abx --wound care consulted Plan: --cont abx as IV zosyn for 2 days  AKI (acute kidney injury) ([place]) --baseline Cr around 0.8.  on presentation, Cr 2.15, with increased BUN to 39.  Indicating dehydration. --Cr improved to 1 the next day after IVF --oral hydration  Colon cancer [facility]) Status postsurgical resection and currently on chemotherapy Follow-up with oncology as an outpatient   Anxiety and depression Continue venlafaxine  Chronic obstructive pulmonary disease (HCC) Chronic hypoxemic respiratory failure on 2L PRN --per pt and husband, she hasn't been needing supplemental O2 recently   Diabetes mellitus type 2, controlled, without complications (HCC) --cont glargine 10u nightly --SSI TID   DVT prophylaxis: Lovenox SQ Code Status: Full code  Family Communication: husband updated at bedside Level of care: Med-Surg Dispo:   The patient is from: home Anticipated d/c is to: home Anticipated d/c date is: tomorrow   Subjective and Interval History:  Pt reported feeling much better.  On room air.     Objective: Vitals:   10/12/22 0546 10/12/22 0727 10/12/22 1124 10/12/22 1629  BP: (!) 153/79 138/73 120/63 121/64  Pulse: 98 90 83 81  Resp: 18 20 18 18   Temp: 97.8 F (36.6 C) 98.7 F (37.1 C) 98.7 F (37.1 C) 97.8 F (36.6 C)  TempSrc: Oral     SpO2: 92% 96% 96% 91%  Weight:  Height:        Intake/Output Summary (Last 24 hours) at 10/12/2022 1758 Last data filed at 10/12/2022 1754 Gross per 24 hour  Intake 516.79 ml  Output 2200 ml  Net -1683.21 ml   Filed Weights   10/11/22 0819 10/11/22 1530  Weight: 64.7 kg 63.8 kg    Examination:   Constitutional: NAD, AAOx3, sitting in recliner HEENT: conjunctivae and lids normal, EOMI CV: No cyanosis.   RESP: normal respiratory effort,  on RA Neuro: II - XII grossly intact.   Psych: Normal mood and affect.  Appropriate judgement and reason   Data Reviewed: I have personally reviewed labs and imaging studies  Time spent: 50 minutes  Enzo Bi, MD [facility] If 7PM-7AM, please contact night-coverage 10/12/2022, 5:58 PM

## 2022-10-12 NOTE — Progress Notes (Signed)
10/12/2022  Subjective: No acute events overnight.  The patient reports that she is having less pain in the left buttocks area.  White blood cell count remains normal at 5.6 with stable hemoglobin of 8.6.  Her renal failure is much improved with a creatinine now back to normal of 1.0.  Vital signs: Temp:  [97.8 F (36.6 C)-98.7 F (37.1 C)] 98.7 F (37.1 C) (11/12 1124) Pulse Rate:  [69-105] 83 (11/12 1124) Resp:  [12-20] 18 (11/12 1124) BP: (102-153)/(54-85) 120/63 (11/12 1124) SpO2:  [92 %-100 %] 96 % (11/12 1124) Weight:  [63.8 kg] 63.8 kg (11/11 1530)   Intake/Output: 11/11 0701 - 11/12 0700 In: 200 [IV Piggyback:200] Out: 2200 [Urine:2200] Last BM Date : 10/11/22  Physical Exam: Constitutional: No acute distress Skin: Left buttocks pressure wound is stable with the same central purple/erythema: Consistent with a pressure wound.  No fluctuance still but only some induration.  Labs:  Recent Labs    10/11/22 0842 10/12/22 0454  WBC 6.5 5.6  HGB 8.5* 8.6*  HCT 28.0* 28.2*  PLT 155 120*   Recent Labs    10/11/22 0842 10/12/22 0454  NA 140 137  K 4.6 3.9  CL 100 103  CO2 30 29  GLUCOSE 127* 161*  BUN 39* 31*  CREATININE 2.15* 1.00  CALCIUM 7.6* 7.7*   Recent Labs    10/11/22 0842  LABPROT 14.3  INR 1.1    Imaging: US Venous Img Lower Bilateral  Result Date: 10/11/2022 CLINICAL DATA:  Leg pain. EXAM: BILATERAL LOWER EXTREMITY VENOUS DOPPLER ULTRASOUND TECHNIQUE: Gray-scale sonography with compression, as well as color and duplex ultrasound, were performed to evaluate the deep venous system(s) from the level of the common femoral vein through the popliteal and proximal calf veins. COMPARISON:  None Available. FINDINGS: VENOUS Normal compressibility of the common femoral, superficial femoral, and popliteal veins, as well as the visualized calf veins. Visualized portions of profunda femoral vein and great saphenous vein unremarkable. No filling defects to  suggest DVT on grayscale or color Doppler imaging. Doppler waveforms show normal direction of venous flow, normal respiratory plasticity and response to augmentation. Limited views of the contralateral common femoral vein are unremarkable. OTHER None. Limitations: none IMPRESSION: Negative. Electronically Signed   By: Keane Police D.O.   On: 10/11/2022 13:17    Assessment/Plan: This is a 71 y.o. female with a left buttocks pressure ulcer.  - Pressure ulcer stable with no worsening and no need for any debridement at this point.  Continue with appropriate wound care and also with frequent offloading.  Recommend starting her on IV antibiotics as a precaution for the wound as there is some mild degree of infection with that with associated erythema/induration. - No acute surgical needs at this point.  However if there is any worsening of the area, please feel free to reach [place] again so we can reevaluate if there is any need for debridement.  We will sign off for now, but will be available if anything is needed.   I spent 35 minutes dedicated to the care of this patient on the date of this encounter to include pre-visit review of records, face-to-face time with the patient discussing diagnosis and management, and any post-visit coordination of care.  Melvyn Neth, [facility]

## 2022-10-13 DIAGNOSIS — G9341 Metabolic encephalopathy: Secondary | ICD-10-CM | POA: Diagnosis not present

## 2022-10-13 LAB — MAGNESIUM
Magnesium: 1.1 mg/dL — ABNORMAL LOW (ref 1.7–2.4)
Magnesium: 2.2 mg/dL (ref 1.7–2.4)

## 2022-10-13 LAB — BASIC METABOLIC PANEL
Anion gap: 6 (ref 5–15)
BUN: 20 mg/dL (ref 8–23)
CO2: 31 mmol/L (ref 22–32)
Calcium: 7.9 mg/dL — ABNORMAL LOW (ref 8.9–10.3)
Chloride: 101 mmol/L (ref 98–111)
Creatinine, Ser: 0.67 mg/dL (ref 0.44–1.00)
GFR, Estimated: >60 mL/min (ref >60)
Glucose, Bld: 220 mg/dL — ABNORMAL HIGH (ref 70–99)
Potassium: 3.8 mmol/L (ref 3.5–5.1)
Sodium: 138 mmol/L (ref 135–145)

## 2022-10-13 LAB — CBC
HCT: 24.2 % — ABNORMAL LOW (ref 36.0–46.0)
Hemoglobin: 7.7 g/dL — ABNORMAL LOW (ref 12.0–15.0)
MCH: 27.6 pg (ref 26.0–34.0)
MCHC: 31.8 g/dL (ref 30.0–36.0)
MCV: 86.7 fL (ref 80.0–100.0)
Platelets: 106 10*3/uL — ABNORMAL LOW (ref 150–400)
RBC: 2.79 MIL/uL — ABNORMAL LOW (ref 3.87–5.11)
RDW: 17.8 % — ABNORMAL HIGH (ref 11.5–15.5)
WBC: 4 10*3/uL (ref 4.0–10.5)
nRBC: 0 % (ref 0.0–0.2)

## 2022-10-13 LAB — GLUCOSE, CAPILLARY
Glucose-Capillary: 191 mg/dL — ABNORMAL HIGH (ref 70–99)
Glucose-Capillary: 266 mg/dL — ABNORMAL HIGH (ref 70–99)

## 2022-10-13 MED ORDER — FUROSEMIDE 40 MG PO TABS: ORAL_TABLET | ORAL | Status: DC

## 2022-10-13 MED ORDER — PIPERACILLIN-TAZOBACTAM 3.375 G IVPB
3.3750 g | Freq: Three times a day (TID) | INTRAVENOUS | Status: DC
  Administered 2022-10-13: 3.375 g via INTRAVENOUS
  Filled 2022-10-13: qty 50

## 2022-10-13 MED ORDER — LOSARTAN POTASSIUM 25 MG PO TABS: ORAL_TABLET | ORAL | Status: DC

## 2022-10-13 MED ORDER — HEPARIN SOD (PORK) LOCK FLUSH 100 UNIT/ML IV SOLN
500.0000 [IU] | Freq: Once | INTRAVENOUS | Status: AC
  Administered 2022-10-13: 500 [IU] via INTRAVENOUS
  Filled 2022-10-13: qty 5

## 2022-10-13 MED ORDER — AMOXICILLIN-POT CLAVULANATE 875-125 MG PO TABS: 1.0000 | ORAL_TABLET | Freq: Two times a day (BID) | ORAL | 0 refills | Status: AC

## 2022-10-13 MED ORDER — MAGNESIUM SULFATE 4 GM/100ML IV SOLN
4.0000 g | Freq: Once | INTRAVENOUS | Status: AC
  Administered 2022-10-13: 4 g via INTRAVENOUS
  Filled 2022-10-13: qty 100

## 2022-10-13 MED ORDER — CHLORHEXIDINE GLUCONATE CLOTH 2 % EX PADS
6.0000 | MEDICATED_PAD | Freq: Every day | CUTANEOUS | Status: DC
  Administered 2022-10-13: 6 via TOPICAL

## 2022-10-13 NOTE — Consult Note (Signed)
WOC Nurse Consult Note: Reason for Consult:Deep tissue pressure injury to left buttock at medial cleft. Patient has been seen by Surgery (Dr. Hampton Abbot) and the treatment is established. I will transfer Dr. Mont Dutton orders to Nursing Orders and add guidance for additional pressure injury prevention. See Photo provided by Dr. Hampton Abbot to the EMR. Wound type:Pressure. Deep Tissue Pressure Injury in evolution Pressure Injury POA: Yes Measurement:Per Dr. Hampton Abbot:  6cm x 3.5cm area of purple/maroon discoloration with epidermal sloughing Wound bed:As described above. No fluctuance. Drainage (amount, consistency, odor) None Periwound: intact Dressing procedure/placement/frequency:Per Dr. Mont Dutton recommendations, I have written Orders for a mattress replacement with low air loss feature, also while turning and turning and repositioning is in place, I have added guidance to minimize time in the supine position and to float the heels. A pressure redistribution chair cushion is provided for the patient's use while OOB in the chair and is to be sent home with the patient at time of discharge. Topical care is to be with a silicone foam dressing.  [place] nursing team will not follow, but will remain available to this patient, the nursing and medical teams.  Please re-consult if needed.  Thank you for inviting [place] to participate in this patient's Plan of Care.  Maudie Flakes, MSN, RN, CNS, [place], Serita Grammes, [employer], [employer] phone:  [phone]

## 2022-10-13 NOTE — Progress Notes (Signed)
Pt a/o x4 upon review of AVS. Port hepain and saline locked covered with guaze dsg.  Ambulating well. No further needs.

## 2022-10-13 NOTE — Discharge Summary (Signed)
Physician Discharge Summary   Jessica Stout  female DOB: Feb 23, 1951  ERX:[id number]  PCP: Albina Billet, MD  Admit date: 10/11/2022 Discharge date: 10/13/2022  Admitted From: home Disposition:  home Husband updated at bedside prior to discharge. CODE STATUS: Full code  Discharge Instructions     Diet - low sodium heart healthy   Complete by: As directed    Discharge instructions   Complete by: As directed    Due to your acute kidney injury (likely from dehydration), your home Lasix and losartan are held, pending followup with outpatient doctor.  Please stop your potassium supplement, and have your potassium and magnesium levels checked with Dr. Grayland Ormond about 1 week after discharge to determine if you still need to be on the supplement.  For your pressure injury on your behind, you are discharged on 5 more days of Augmentin to prevent infection.  Please be sure to change position often and not put pressure on the same area for prolonged periods of time.   Dr. Enzo Bi - -   Discharge wound care:   Complete by: As directed    Wound care to left buttock:  Cleanse, pat dry. Cover lesion with silicone foam dressing.  Change foam every 3 days and as needed with soiling. [facility] Course:  For full details, please see H&P, progress notes, consult notes and ancillary notes.  Briefly,  Jessica Stout is a 71 y.o. 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   Per patient's husband she is usually difficult to arouse but worse on the morning  of her admission.  She had gone to bed in her recliner at about 6 PM and he tried to wake her up at about 7 AM and had difficulty arousing the patient so he called EMS.  Per EMS patient was noted to be hypoxic on room air and hypotensive.  She had a blood pressure of 95/43 in the ER and received 2 L of IV fluids with improvement in her blood pressure.   * Acute metabolic encephalopathy --possible due to gabapentin in the setting of AKI (reduced kidney clearance).   --alert and mental status back to baseline the next day  AKI (acute kidney injury) ([place]) --baseline Cr around 0.8.  on presentation, Cr 2.15, with increased BUN to 39.  Indicating dehydration.  Cr improved next day after IVF, and was 0.67 prior to discharge. --hold home lasix and losartan until outpatient f/u.  Pressure injury of buttock, unstageable ([place]), POA Patient noted to have a deep tissue injury involving the right buttock, due to sitting and sleeping in recliner for prolonged periods of time --GenSurg consulted, rec empiric abx.  Pt received 2 days of IV abx and discharged on 5 more days of Augmentin. --wound care consulted with wound care and dressing rec as above. --Discussed with pt and husband the importance of not putting pressure on the same area for prolonged periods of time.   Recent Hx of hypokalemia --potassium level 2.9 on 10/07/22, so pt was prescribed potassium supplement, though pt said she hasn't  started taking it.  Potassium level has been wnl during hospitalization.  Mag level was found to be very low, so could the reason for hypokalemia. --Home potassium supplement d/c'ed, and pt was advised to check level with Dr. Grayland Ormond about a week after discharge.  Hypomag --mag 1.1.  After IV mag repletion, mag was 2.2 prior to discharge. --pt was advised to check level with Dr. Grayland Ormond about a week after discharge.   Chronic systolic CHF (congestive heart failure) (HCC) Last known LVEF of 45 to 50% from a 2D  echocardiogram which was done 3/23 --Hold furosemide and Cozaar due to AKI, pending outpatient f/u. --home metoprolol held due to relative hypotension, to be resumed after discharge.   Lung cancer [facility]) Follow-up with oncology as an outpatient   Colon cancer [facility]) patient noted to have a large colon mass which partially invaded the stomach as well as surrounding mesentery.  Status post surgical resection and currently on chemotherapy Follow-up with oncology as an outpatient  Anxiety and depression Continue venlafaxine   Chronic obstructive pulmonary disease (HCC) Chronic hypoxemic respiratory failure on 2L PRN --per pt and husband, she hasn't been needing supplemental O2 recently.   --hypoxia when EMS found pt likely due to her somnolence and reduced respiratory effort.  After mental status improved, pt was sating well on room air.   Diabetes mellitus type 2, controlled, without complications ([place]) --hyperglycemia due to pt not given enough insulin. --discharged back on home insulin regimen and Janumet.   Discharge Diagnoses:  Principal Problem:   Acute metabolic encephalopathy Active Problems:   Diabetes mellitus type 2, controlled, without complications (HCC)   Chronic obstructive pulmonary disease (HCC)   Anxiety and depression   Colon cancer (HCC)   AKI (acute kidney injury) ([place])   Pressure injury of buttock, unstageable ([place])   Chronic systolic CHF (congestive heart failure) ([place])   Lung cancer ([place])   30 Day Unplanned Readmission Risk Score    Flowsheet Row ED to Hosp-Admission (Current) from 10/11/2022 in [employer]  30 Day Unplanned Readmission Risk Score (%) 21.64 Filed at 10/13/2022 1200       This score is the patient's risk of an unplanned readmission within 30 days of being discharged (0 -100%). The score is based on dignosis, age, lab data, medications, orders, and past utilization.   Low:  0-14.9   Medium:  15-21.9   High: 22-29.9   Extreme: 30 and above         Discharge Instructions:  Allergies as of 10/13/2022       Reactions   Codeine Rash   Happened in 1970s        Medication List     STOP taking these medications    oxyCODONE 5 MG immediate release tablet Commonly known as: Oxy IR/ROXICODONE   potassium chloride SA 20 MEQ tablet Commonly known as: KLOR-CON M       TAKE these medications    acetaminophen 325 MG tablet Commonly known as: TYLENOL Take 650 mg by mouth every 6 (six) hours as needed.   albuterol 108 (90 Base) MCG/ACT inhaler Commonly known as: VENTOLIN HFA Inhale into the lungs.   amoxicillin-clavulanate 875-125 MG tablet Commonly known as: AUGMENTIN Take 1 tablet by mouth 2 (two) times daily for 5 days.   atorvastatin 40 MG tablet Commonly known as: LIPITOR Take 40 mg by mouth daily.   fenofibrate 54 MG tablet Take 54 mg by mouth daily.   furosemide 40  MG tablet Commonly known as: LASIX Hold until followup with outpatient provider due to acute kidney injury. What changed:  how much to take how to take this when to take this additional instructions   gabapentin 600 MG tablet Commonly known as: NEURONTIN Take by mouth 2 (two) times daily.   insulin lispro 100 UNIT/ML injection Commonly known as: HUMALOG Inject 1-10 Units into the skin 3 (three) times daily before meals. Per sliding scale   Lantus SoloStar 100 UNIT/ML Solostar Pen Generic drug: insulin glargine Inject 12 Units into the skin at bedtime.   losartan 25 MG tablet Commonly known as: COZAAR Hold until followup with outpatient provider due to acute kidney injury. What changed:  how much to take how to take this when to take this additional instructions   metoprolol succinate 25 MG 24 hr tablet Commonly known as: TOPROL-XL Take 1 tablet (25 mg total) by mouth daily.   pantoprazole 40 MG tablet Commonly known as: Protonix Take 1 tablet (40 mg total) by mouth  daily.   ranolazine 500 MG 12 hr tablet Commonly known as: RANEXA Take 500 mg by mouth 2 (two) times daily.   sitaGLIPtin-metformin 50-1000 MG tablet Commonly known as: JANUMET Take 1 tablet by mouth 2 (two) times daily with a meal.   venlafaxine XR 75 MG 24 hr capsule Commonly known as: EFFEXOR-XR Take 75 mg by mouth daily.               Discharge Care Instructions  (From admission, onward)           Start     Ordered   10/13/22 0000  Discharge wound care:       Comments: Wound care to left buttock:  Cleanse, pat dry. Cover lesion with silicone foam dressing.  Change foam every 3 days and as needed with soiling. - -   10/13/22 1234             Follow-up Information     Lloyd Huger, MD Follow up in 1 week(s).   Specialty: Oncology Why: check potassium and magnesium levels Contact information: [place] [id number] [phone]                 Allergies  Allergen Reactions   Codeine Rash    Happened in 1970s     The results of significant diagnostics from this hospitalization (including imaging, microbiology, ancillary and laboratory) are listed below for reference.   Consultations:   Procedures/Studies: US Venous Img Lower Bilateral  Result Date: 10/11/2022 CLINICAL DATA:  Leg pain. EXAM: BILATERAL LOWER EXTREMITY VENOUS DOPPLER ULTRASOUND TECHNIQUE: Gray-scale sonography with compression, as well as color and duplex ultrasound, were performed to evaluate the deep venous system(s) from the level of the common femoral vein through the popliteal and proximal calf veins. COMPARISON:  None Available. FINDINGS: VENOUS Normal compressibility of the common femoral, superficial femoral, and popliteal veins, as well as the visualized calf veins. Visualized portions of profunda femoral vein and great saphenous vein unremarkable. No filling defects to suggest DVT on grayscale or color Doppler imaging. Doppler waveforms show  normal direction of venous flow, normal respiratory plasticity and response to augmentation. Limited views of the contralateral common femoral vein are unremarkable. OTHER None. Limitations: none IMPRESSION: Negative. Electronically Signed   By: Keane Police D.O.   On: 10/11/2022 13:17   CT CHEST ABDOMEN PELVIS WO CONTRAST  Result Date: 10/11/2022 CLINICAL DATA:  Abdominal pain.  History of colon carcinoma.  EXAM: CT CHEST, ABDOMEN AND PELVIS WITHOUT CONTRAST TECHNIQUE: Multidetector CT imaging of the chest, abdomen and pelvis was performed following the standard protocol without IV contrast. RADIATION DOSE REDUCTION: This exam was performed according to the departmental dose-optimization program which includes automated exposure control, adjustment of the mA and/or kV according to patient size and/or use of iterative reconstruction technique. COMPARISON:  08/29/2022, CT chest. CT abdomen pelvis, 02/03/2022. PET-CT, 02/27/2022. FINDINGS: CT CHEST FINDINGS Cardiovascular: Heart mildly enlarged. Three-vessel coronary artery calcifications. No pericardial effusion. Great vessels are normal in caliber. Mild aortic atherosclerosis. Mediastinum/Nodes: No neck base, mediastinal or hilar masses or enlarged lymph nodes. Trachea and esophagus are unremarkable. Lungs/Pleura: Bilateral lung base opacities, mostly in the lower lobes. Additional smaller peripheral opacities are noted in both upper lobes and in the right middle lobe, findings new since the prior CT. Small stable focus of linear type opacity in the posterior left lower lobe, superior segment. Stable 4 mm nodule, posterior left upper lobe, image 45, series 4. Mild changes of centrilobular emphysema. No evidence of pulmonary edema. No pleural effusion or pneumothorax. No discrete lung mass or new nodule. Musculoskeletal: No fracture or acute finding. No osteoblastic or osteolytic lesions. No chest wall mass. CT ABDOMEN PELVIS FINDINGS Hepatobiliary: Liver normal  in overall size. Small low-density lesion, inferior aspect of segment 6, stable and consistent with a cyst. No other liver masses or lesions. Single dependent gallstone. No evidence of acute cholecystitis. No bile duct dilation. Pancreas: Unremarkable. No pancreatic ductal dilatation or surrounding inflammatory changes. Spleen: Normal in size without focal abnormality. Adrenals/Urinary Tract: Heterogeneous left adrenal mass, containing macroscopic fat, 3.1 cm in size, stable. Stable mild right adrenal gland thickening. Stable cyst from the upper pole the right kidney. No other renal masses, no stones and no hydronephrosis. Normal ureters. Bladder is decompressed, otherwise unremarkable. Stomach/Bowel: Bowel anastomosis staples noted along the anterior and inferior aspect of the stomach. No stomach mass or wall thickening. Stomach is mostly decompressed. Small bowel and colonic anastomosis staples and adjacent vascular clips are new since the prior exam. No evidence of bowel obstruction or inflammation. No bowel mass. Vascular/Lymphatic: Dense aortic atherosclerosis. No aneurysm. Prominent gastrohepatic ligament lymph nodes measuring up to 9 mm in short axis. No enlarged intraperitoneal, retroperitoneal or pelvic lymph nodes. Reproductive: Unremarkable. Other: Radiopaque mesh extends along the anterior abdominal wall. Midline incision, well-healed. No abdominal wall mass. No hernia. No ascites. Musculoskeletal: No fracture or acute finding.  No bone lesion. IMPRESSION: 1. There are new areas of lung opacity, at the lung bases, mostly in the lower lobes, and peripheral aspects the upper and right middle lobes. These areas may all be atelectasis. Consider pneumonia if there are consistent clinical findings. 2. Small linear opacity in the left lower lobe superior segment, the larger nodular opacity on prior studies, is stable from the most recent prior exam. Stable ill-defined 4 mm left upper lobe nodule. No new lung  nodules. No lung masses. 3. Colon surgery has been performed since the prior study. Previously seen transverse colon mass is been resected. There new stomach and bowel anastomosis staples. No evidence of bowel obstruction or inflammation. 4. Stable left adrenal mass containing macroscopic fat. 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 No other evidence of metastatic disease within the abdomen or pelvis. 5. No acute abnormalities within the abdomen or pelvis. Electronically Signed   By: Lajean Manes M.D.   On: 10/11/2022 12:00   CT HEAD  WO CONTRAST (5MM)  Result Date: 10/11/2022 CLINICAL DATA:  Head trauma.  Found unresponsive. EXAM: CT HEAD WITHOUT CONTRAST TECHNIQUE: Contiguous axial images were obtained from the base of the skull through the vertex without intravenous contrast. RADIATION DOSE REDUCTION: This exam was performed according to the departmental dose-optimization program which includes automated exposure control, adjustment of the mA and/or kV according to patient size and/or use of iterative reconstruction technique. COMPARISON:  None Available. FINDINGS: Brain: There is no evidence for acute hemorrhage, hydrocephalus, mass lesion, or abnormal extra-axial fluid collection. No definite CT evidence for acute infarction. Vascular: No hyperdense vessel or unexpected calcification. Skull: No evidence for fracture. No worrisome lytic or sclerotic lesion. Sinuses/Orbits: Mucosal thickening with air-fluid level noted right sphenoid sinus. Maxillary, frontal, and mastoid sinuses are clear. Other: None. IMPRESSION: 1. No acute intracranial abnormality. 2. 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 Electronically Signed   By: Misty Stanley M.D.   On: 10/11/2022 11:46   DG Chest Port 1 View  Result Date: 10/11/2022 CLINICAL DATA:  Questionable sepsis - evaluate for abnormality EXAM: PORTABLE CHEST 1 VIEW COMPARISON:  10/07/2022  FINDINGS: Right Port-A-Cath remains in place, unchanged. Heart is normal size. Bibasilar airspace opacities, favor atelectasis. Possible small bilateral effusions. No acute bony abnormality. IMPRESSION: Worsening bibasilar opacities, likely atelectasis. Question small bilateral effusions. Electronically Signed   By: Rolm Baptise M.D.   On: 10/11/2022 08:27   DG Chest 2 View  Result Date: 10/10/2022 CLINICAL DATA:  Shortness of breath and ankle swelling. EXAM: CHEST - 2 VIEW COMPARISON:  July 22, 2022 FINDINGS: Injectable port in stable position. Cardiomediastinal silhouette is normal. Mediastinal contours appear intact. Streaky airspace opacities in bilateral lower lobes, left greater than right. Mild pulmonary vascular congestion. Osseous structures are without acute abnormality. Soft tissues are grossly normal. IMPRESSION: Streaky airspace opacities in bilateral lower lobes, left greater than right, may represent atelectasis versus airspace consolidation. Mild pulmonary vascular congestion. Electronically Signed   By: Fidela Salisbury M.D.   On: 10/10/2022 17:53      Labs: BNP (last 3 results) Recent Labs    01/23/22 1519 02/19/22 1552 10/11/22 0842  BNP 2,004.6* 469.1* 194.1*   Basic Metabolic Panel: Recent Labs  Lab 10/07/22 0839 10/11/22 0842 10/12/22 0454 10/13/22 0530 10/13/22 1138  NA 140 140 137 138  --   K 2.9* 4.6 3.9 3.8  --   CL 97* 100 103 101  --   CO2 32 30 29 31   --   GLUCOSE 113* 127* 161* 220*  --   BUN 16 39* 31* 20  --   CREATININE 1.06* 2.15* 1.00 0.67  --   CALCIUM 7.6* 7.6* 7.7* 7.9*  --   MG  --   --   --  1.1* 2.2   Liver Function Tests: Recent Labs  Lab 10/07/22 0839 10/11/22 0842  AST 14* 19  ALT 8 10  ALKPHOS 58 49  BILITOT 0.8 0.8  PROT 6.7 6.4*  ALBUMIN 2.8* 2.9*   No results for input(s): "LIPASE", "AMYLASE" in the last 168 hours. No results for input(s): "AMMONIA" in the last 168 hours. CBC: Recent Labs  Lab 10/07/22 0839  10/11/22 0842 10/12/22 0454 10/13/22 0530  WBC 5.0 6.5 5.6 4.0  NEUTROABS 3.4 5.4  --   --   HGB 9.4* 8.5* 8.6* 7.7*  HCT 31.2* 28.0* 28.2* 24.2*  MCV 89.9 90.0 89.2 86.7  PLT 175 155 120* 106*   Cardiac Enzymes: No results for input(s): "CKTOTAL", "CKMB", "  CKMBINDEX", "TROPONINI" in the last 168 hours. BNP: Invalid input(s): "POCBNP" CBG: Recent Labs  Lab 10/12/22 1122 10/12/22 1632 10/12/22 2043 10/13/22 0811 10/13/22 1155  GLUCAP 220* 151* 185* 191* 266*   D-Dimer Recent Labs    10/11/22 1302  DDIMER 2.83*   Hgb A1c Recent Labs    10/11/22 1532  HGBA1C 7.0*   Lipid Profile No results for input(s): "CHOL", "HDL", "LDLCALC", "TRIG", "CHOLHDL", "LDLDIRECT" in the last 72 hours. Thyroid function studies No results for input(s): "TSH", "T4TOTAL", "T3FREE", "THYROIDAB" in the last 72 hours.  Invalid input(s): "FREET3" Anemia work up No results for input(s): "VITAMINB12", "FOLATE", "FERRITIN", "TIBC", "IRON", "RETICCTPCT" in the last 72 hours. Urinalysis    Component Value Date/Time   COLORURINE YELLOW (A) 10/11/2022 0842   APPEARANCEUR HAZY (A) 10/11/2022 0842   LABSPEC 1.013 10/11/2022 0842   PHURINE 5.0 10/11/2022 0842   GLUCOSEU NEGATIVE 10/11/2022 0842   HGBUR NEGATIVE 10/11/2022 0842   BILIRUBINUR NEGATIVE 10/11/2022 0842   KETONESUR NEGATIVE 10/11/2022 0842   PROTEINUR 30 (A) 10/11/2022 0842   NITRITE NEGATIVE 10/11/2022 0842   LEUKOCYTESUR NEGATIVE 10/11/2022 0842   Sepsis Labs Recent Labs  Lab 10/07/22 0839 10/11/22 0842 10/12/22 0454 10/13/22 0530  WBC 5.0 6.5 5.6 4.0   Microbiology Recent Results (from the past 240 hour(s))  Resp Panel by RT-PCR (Flu A&B, Covid) Anterior Nasal Swab     Status: None   Collection Time: 10/11/22  8:42 AM   Specimen: Anterior Nasal Swab  Result Value Ref Range Status   SARS Coronavirus 2 by RT PCR NEGATIVE NEGATIVE Final    Comment: (NOTE) SARS-CoV-2 target nucleic acids are NOT DETECTED.  The  SARS-CoV-2 RNA is generally detectable in upper respiratory specimens during the acute phase of infection. The lowest concentration of SARS-CoV-2 viral copies this assay can detect is 138 copies/mL. A negative result does not preclude SARS-Cov-2 infection and should not be used as the sole basis for treatment or other patient management decisions. A negative result may occur with  improper specimen collection/handling, submission of specimen other than nasopharyngeal swab, presence of viral mutation(s) within the areas targeted by this assay, and inadequate number of viral copies(<138 copies/mL). A negative result must be combined with clinical observations, patient history, and epidemiological information. The expected result is Negative.  Fact Sheet for Patients:  EntrepreneurPulse.com.au  Fact Sheet for Healthcare Providers:  IncredibleEmployment.be  This test is no t yet approved or cleared by the [place] FDA and  has been authorized for detection and/or diagnosis of SARS-CoV-2 by FDA under an Emergency Use Authorization (EUA). This EUA will remain  in effect (meaning this test can be used) for the duration of the COVID-19 declaration under Section 564(b)(1) of the Act, 21 U.S.C.section 360bbb-3(b)(1), unless the authorization is terminated  or revoked sooner.       Influenza A by PCR NEGATIVE NEGATIVE Final   Influenza B by PCR NEGATIVE NEGATIVE Final    Comment: (NOTE) The Xpert Xpress SARS-CoV-2/FLU/RSV plus assay is intended as an aid in the diagnosis of influenza from Nasopharyngeal swab specimens and should not be used as a sole basis for treatment. Nasal washings and aspirates are unacceptable for Xpert Xpress SARS-CoV-2/FLU/RSV testing.  Fact Sheet for Patients: EntrepreneurPulse.com.au  Fact Sheet for Healthcare Providers: IncredibleEmployment.be  This test is not yet approved or  cleared by the [place] FDA and has been authorized for detection and/or diagnosis of SARS-CoV-2 by FDA under an Emergency Use Authorization (EUA). This EUA will  remain in effect (meaning this test can be used) for the duration of the COVID-19 declaration under Section 564(b)(1) of the Act, 21 U.S.C. section 360bbb-3(b)(1), unless the authorization is terminated or revoked.  Performed at [facility], [address] [place], [place] [address]   Blood Culture (routine x 2)     Status: None (Preliminary result)   Collection Time: 10/11/22  8:42 AM   Specimen: BLOOD  Result Value Ref Range Status   Specimen Description BLOOD BLOOD RIGHT ARM  Final   Special Requests   Final    BOTTLES DRAWN AEROBIC AND ANAEROBIC Blood Culture adequate volume   Culture   Final    NO GROWTH 2 DAYS Performed at [facility], [address], [place], [place] [address]    Report Status PENDING  Incomplete  Blood Culture (routine x 2)     Status: None (Preliminary result)   Collection Time: 10/11/22  8:42 AM   Specimen: BLOOD  Result Value Ref Range Status   Specimen Description BLOOD LEFT ANTECUBITAL  Final   Special Requests   Final    BOTTLES DRAWN AEROBIC AND ANAEROBIC Blood Culture adequate volume   Culture   Final    NO GROWTH 2 DAYS Performed at [facility], [address] [place], [place] [address]    Report Status PENDING  Incomplete  Urine Culture     Status: None   Collection Time: 10/11/22  8:42 AM   Specimen: In/Out Cath Urine  Result Value Ref Range Status   Specimen Description   Final    IN/OUT CATH URINE Performed at [facility], [address] [place], [place] [address]    Special Requests   Final    NONE Performed at [facility], [address], [place], [place] [address]    Culture   Final    NO GROWTH Performed at [facility], [place] [address] [place], [place] [address]    Report Status 10/12/2022 FINAL   Final     Total time spend on discharging this patient, including the last patient exam, discussing the hospital stay, instructions for ongoing care as it relates to all pertinent caregivers, as well as preparing the medical discharge records, prescriptions, and/or referrals as applicable, is 45 minutes.    Enzo Bi, MD  [facility] 10/13/2022, 1:03 PM

## 2022-10-13 NOTE — Inpatient Diabetes Management (Signed)
Inpatient Diabetes Program Recommendations  AACE/ADA: New Consensus Statement on Inpatient Glycemic Control   Target Ranges:  Prepandial:   less than 140 mg/dL      Peak postprandial:   less than 180 mg/dL (1-2 hours)      Critically ill patients:  140 - 180 mg/dL    Latest Reference Range & Units 10/12/22 07:29 10/12/22 11:22 10/12/22 16:32 10/12/22 20:43  Glucose-Capillary 70 - 99 mg/dL 158 (H) 220 (H) 151 (H) 185 (H)   Review of Glycemic Control  Diabetes history: DM2 Outpatient Diabetes medications: Lantus 12 units QHS, Janumet 50-1000 mg BID Current orders for Inpatient glycemic control: Semglee 10 units QHS, Novolog 0-15 units TID with meals  Inpatient Diabetes Program Recommendations:    Insulin: Please consider ordering Novolog 2 units TID with meals for meal coverage if patient eats at least 50% of meals.  Thanks, Barnie Alderman, RN, MSN, [facility] Diabetes Coordinator Inpatient Diabetes Program [phone] (Team Pager from 8am to [place])

## 2022-10-13 NOTE — Progress Notes (Signed)
Mobility Specialist - Progress Note   10/13/22 1040  Mobility  Activity Ambulated independently in hallway  Level of Assistance Modified independent, requires aide device or extra time  Assistive Device Front wheel walker  Distance Ambulated (ft) 160 ft  Activity Response Tolerated well  Mobility Referral Yes  $Mobility charge 1 Mobility   Pt in bathroom, utilizing RA. Pt ambulated one lap around NS with RW ModI. Pt voiced feeling "tired" but willing to finish out the remainder of the lap. Pt returned to room, left supine with alarm set and needs within reach. Family present at bedside, no complaints.   [place]ie [place] Mobility Specialist 10/13/22 10:43 AM

## 2022-10-13 NOTE — Progress Notes (Signed)
Mobility Specialist - Progress Note   10/13/22 1033  Mobility  Activity Ambulated independently to bathroom  Level of Assistance Modified independent, requires aide device or extra time  Assistive Device Front wheel walker  Distance Ambulated (ft) 8 ft  Activity Response Tolerated well  Mobility Referral Yes  $Mobility charge 1 Mobility   Pt in fowler position upon entry, utilizing RA. Pt completed bed mob and STS to [place], extra time given. Pt ambulated to the bathroom, left sitting.   Candie Mile Mobility Specialist 10/13/22 10:35 AM

## 2022-10-14 ENCOUNTER — Ambulatory Visit: Payer: Medicare Other

## 2022-10-14 ENCOUNTER — Ambulatory Visit: Payer: Medicare Other | Admitting: Oncology

## 2022-10-14 ENCOUNTER — Other Ambulatory Visit: Payer: Medicare Other

## 2022-10-15 ENCOUNTER — Other Ambulatory Visit: Payer: Self-pay | Admitting: *Deleted

## 2022-10-16 ENCOUNTER — Ambulatory Visit: Payer: Medicare Other | Attending: Cardiovascular Disease | Admitting: Cardiovascular Disease

## 2022-10-16 LAB — CULTURE, BLOOD (ROUTINE X 2)
Culture: NO GROWTH
Culture: NO GROWTH
Special Requests: ADEQUATE
Special Requests: ADEQUATE

## 2022-10-16 NOTE — Progress Notes (Deleted)
Cardiology Office Note   Date:  10/16/2022   ID:  Jessica Stout, DOB 10-28-1951, MRN [id number]  PCP:  Albina Billet, MD  Cardiologist:  Kathlyn Sacramento, MD   No chief complaint on file.     History of Present Illness: Jessica Stout is a 71 y.o. female who presents for ***    Past Medical History:  Diagnosis Date   Anemia    Anxiety    Asthma    Cancer ([place])    colon   CHF (congestive heart failure) (HCC)    COPD (chronic obstructive pulmonary disease) ([place])    Diabetes mellitus without complication ([place])    Hyperlipidemia    Hypertension    Stroke [facility])     Past Surgical History:  Procedure Laterality Date   COLONOSCOPY WITH PROPOFOL N/A 02/03/2022   Procedure: COLONOSCOPY WITH PROPOFOL;  Surgeon: Lin Landsman, MD;  Location: [facility] ENDOSCOPY;  Service: Gastroenterology;  Laterality: N/A;   ESOPHAGOGASTRODUODENOSCOPY (EGD) WITH PROPOFOL N/A 02/03/2022   Procedure: ESOPHAGOGASTRODUODENOSCOPY (EGD) WITH PROPOFOL;  Surgeon: Lin Landsman, MD;  Location: [facility] ENDOSCOPY;  Service: Gastroenterology;  Laterality: N/A;   GIVENS CAPSULE STUDY N/A 02/03/2022   Procedure: GIVENS CAPSULE STUDY;  Surgeon: Lin Landsman, MD;  Location: [facility] ENDOSCOPY;  Service: Gastroenterology;  Laterality: N/A;  possible Capsule Study   IR IMAGING GUIDED PORT INSERTION  06/18/2022     Current Outpatient Medications  Medication Sig Dispense Refill   acetaminophen (TYLENOL) 325 MG tablet Take 650 mg by mouth every 6 (six) hours as needed.     albuterol (VENTOLIN HFA) 108 (90 Base) MCG/ACT inhaler Inhale into the lungs.     amoxicillin-clavulanate (AUGMENTIN) 875-125 MG tablet Take 1 tablet by mouth 2 (two) times daily for 5 days. 10 tablet 0   atorvastatin (LIPITOR) 40 MG tablet Take 40 mg by mouth daily.     fenofibrate 54 MG tablet Take 54 mg by mouth daily.     furosemide (LASIX) 40 MG tablet Hold until followup with outpatient provider due to acute kidney injury. 30 tablet     gabapentin (NEURONTIN) 600 MG tablet Take by mouth 2 (two) times daily.     insulin lispro (HUMALOG) 100 UNIT/ML injection Inject 1-10 Units into the skin 3 (three) times daily before meals. Per sliding scale     LANTUS SOLOSTAR 100 UNIT/ML Solostar Pen Inject 12 Units into the skin at bedtime.     losartan (COZAAR) 25 MG tablet Hold until followup with outpatient provider due to acute kidney injury.     metoprolol succinate (TOPROL-XL) 25 MG 24 hr tablet Take 1 tablet (25 mg total) by mouth daily. 30 tablet 0   pantoprazole (PROTONIX) 40 MG tablet Take 1 tablet (40 mg total) by mouth daily. 30 tablet 5   ranolazine (RANEXA) 500 MG 12 hr tablet Take 500 mg by mouth 2 (two) times daily.     sitaGLIPtin-metformin (JANUMET) 50-1000 MG tablet Take 1 tablet by mouth 2 (two) times daily with a meal.     venlafaxine XR (EFFEXOR-XR) 75 MG 24 hr capsule Take 75 mg by mouth daily.     No current facility-administered medications for this visit.   Facility-Administered Medications Ordered in Other Visits  Medication Dose Route Frequency Provider Last Rate Last Admin   sodium chloride flush (NS) 0.9 % injection 10 mL  10 mL Intracatheter PRN Lloyd Huger, MD   10 mL at 10/09/22 1353    Allergies:   Codeine  Social History:  The patient  reports that she has been smoking e-cigarettes. She does not have any smokeless tobacco history on file. She reports current alcohol use.   Family History:  The patient's ***family history is not on file.    ROS:  Please see the history of present illness.   Otherwise, review of systems are positive for {NONE DEFAULTED:18576}.   All other systems are reviewed and negative.    PHYSICAL EXAM: VS:  There were no vitals taken for this visit. , BMI There is no height or weight on file to calculate BMI. GEN: Well nourished, well developed, in no acute distress  HEENT: normal  Neck: no JVD, carotid bruits, or masses Cardiac: ***RRR; no murmurs, rubs, or  gallops,no edema  Respiratory:  clear to auscultation bilaterally, normal work of breathing GI: soft, nontender, nondistended, + BS MS: no deformity or atrophy  Skin: warm and dry, no rash Neuro:  Strength and sensation are intact Psych: euthymic mood, full affect   EKG:  EKG {ACTION; IS/IS EHO:12248250} ordered today. The ekg ordered today demonstrates ***   Recent Labs: 10/11/2022: ALT 10; B Natriuretic Peptide 118.0 10/13/2022: BUN 20; Creatinine, Ser 0.67; Hemoglobin 7.7; Magnesium 2.2; Platelets 106; Potassium 3.8; Sodium 138    Lipid Panel    Component Value Date/Time   CHOL 108 01/30/2022 0524   TRIG 119 01/30/2022 0524   HDL 36 (L) 01/30/2022 0524   CHOLHDL 3.0 01/30/2022 0524   VLDL 24 01/30/2022 0524   LDLCALC 48 01/30/2022 0524      Wt Readings from Last 3 Encounters:  10/11/22 140 lb 11.2 oz (63.8 kg)  10/07/22 138 lb (62.6 kg)  09/30/22 140 lb (63.5 kg)      Other studies Reviewed: Additional studies/ records that were reviewed today include: ***. Review of the above records demonstrates: ***      No data to display            ASSESSMENT AND PLAN:  1.  ***    Disposition:   FU with *** in {gen number [id number]} {Days to years:10300}  Signed,  Kathlyn Sacramento, MD  10/16/2022 8:54 AM    [place]

## 2022-10-17 ENCOUNTER — Inpatient Hospital Stay (HOSPITAL_BASED_OUTPATIENT_CLINIC_OR_DEPARTMENT_OTHER): Payer: Medicare Other | Admitting: Oncology

## 2022-10-17 ENCOUNTER — Encounter: Payer: Self-pay | Admitting: Oncology

## 2022-10-17 ENCOUNTER — Inpatient Hospital Stay: Payer: Medicare Other

## 2022-10-17 ENCOUNTER — Encounter: Payer: Self-pay | Admitting: Cardiovascular Disease

## 2022-10-17 VITALS — BP 128/73 | HR 105 | Temp 97.8°F | Resp 18 | Wt 137.5 lb

## 2022-10-17 DIAGNOSIS — C189 Malignant neoplasm of colon, unspecified: Secondary | ICD-10-CM

## 2022-10-17 DIAGNOSIS — C184 Malignant neoplasm of transverse colon: Secondary | ICD-10-CM | POA: Diagnosis not present

## 2022-10-17 DIAGNOSIS — Z5111 Encounter for antineoplastic chemotherapy: Secondary | ICD-10-CM | POA: Diagnosis not present

## 2022-10-17 LAB — COMPREHENSIVE METABOLIC PANEL
ALT: 15 U/L (ref 0–44)
AST: 19 U/L (ref 15–41)
Albumin: 3.1 g/dL — ABNORMAL LOW (ref 3.5–5.0)
Alkaline Phosphatase: 59 U/L (ref 38–126)
Anion gap: 9 (ref 5–15)
BUN: 12 mg/dL (ref 8–23)
CO2: 25 mmol/L (ref 22–32)
Calcium: 8.9 mg/dL (ref 8.9–10.3)
Chloride: 104 mmol/L (ref 98–111)
Creatinine, Ser: 0.64 mg/dL (ref 0.44–1.00)
GFR, Estimated: >60 mL/min (ref >60)
Glucose, Bld: 134 mg/dL — ABNORMAL HIGH (ref 70–99)
Potassium: 3.7 mmol/L (ref 3.5–5.1)
Sodium: 138 mmol/L (ref 135–145)
Total Bilirubin: 0.6 mg/dL (ref 0.3–1.2)
Total Protein: 7 g/dL (ref 6.5–8.1)

## 2022-10-17 LAB — CBC WITH DIFFERENTIAL/PLATELET
Abs Immature Granulocytes: 0.02 10*3/uL (ref 0.00–0.07)
Basophils Absolute: 0 10*3/uL (ref 0.0–0.1)
Basophils Relative: 0 %
Eosinophils Absolute: 0 10*3/uL (ref 0.0–0.5)
Eosinophils Relative: 1 %
HCT: 28.1 % — ABNORMAL LOW (ref 36.0–46.0)
Hemoglobin: 8.8 g/dL — ABNORMAL LOW (ref 12.0–15.0)
Immature Granulocytes: 0 %
Lymphocytes Relative: 14 %
Lymphs Abs: 1 10*3/uL (ref 0.7–4.0)
MCH: 27.8 pg (ref 26.0–34.0)
MCHC: 31.3 g/dL (ref 30.0–36.0)
MCV: 88.6 fL (ref 80.0–100.0)
Monocytes Absolute: 0.6 10*3/uL (ref 0.1–1.0)
Monocytes Relative: 9 %
Neutro Abs: 5.8 10*3/uL (ref 1.7–7.7)
Neutrophils Relative %: 76 %
Platelets: 149 10*3/uL — ABNORMAL LOW (ref 150–400)
RBC: 3.17 MIL/uL — ABNORMAL LOW (ref 3.87–5.11)
RDW: 19.1 % — ABNORMAL HIGH (ref 11.5–15.5)
WBC: 7.6 10*3/uL (ref 4.0–10.5)
nRBC: 0 % (ref 0.0–0.2)

## 2022-10-17 LAB — MAGNESIUM: Magnesium: 1.3 mg/dL — ABNORMAL LOW (ref 1.7–2.4)

## 2022-10-17 MED ORDER — MAGNESIUM SULFATE 4 GM/100ML IV SOLN
4.0000 g | Freq: Once | INTRAVENOUS | Status: AC
  Administered 2022-10-17: 4 g via INTRAVENOUS
  Filled 2022-10-17: qty 100

## 2022-10-17 MED ORDER — SODIUM CHLORIDE 0.9% FLUSH
10.0000 mL | Freq: Once | INTRAVENOUS | Status: AC
  Administered 2022-10-17: 10 mL via INTRAVENOUS
  Filled 2022-10-17: qty 10

## 2022-10-17 MED ORDER — HEPARIN SOD (PORK) LOCK FLUSH 100 UNIT/ML IV SOLN
500.0000 [IU] | Freq: Once | INTRAVENOUS | Status: AC
  Administered 2022-10-17: 500 [IU] via INTRAVENOUS
  Filled 2022-10-17: qty 5

## 2022-10-17 NOTE — Progress Notes (Signed)
Hospital follow up. Admitted resp failure/CHF. Appetite is low, comes and goes. Taste altered.Tongue sore with foods. Bowels fine. Takes 2 tramadol per day.

## 2022-10-17 NOTE — Progress Notes (Signed)
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  INTERVAL HISTORY: Patient returns today for hospital follow-up and further evaluation.  She continues to have weakness and fatigue, but is significantly improved since discharge. She has no neurologic complaints.  She denies any recent fevers.  She has no chest pain, cough, or hemoptysis.  She denies any nausea, vomiting, constipation, or diarrhea.  She has no melena or hematochezia.  Patient offers no further specific complaints today.  REVIEW OF SYSTEMS:   Review of Systems  Constitutional:  Positive for malaise/fatigue. Negative for fever and weight loss.  Respiratory: Negative.  Negative for cough, hemoptysis and shortness of breath.   Cardiovascular: Negative.  Negative for chest pain and leg swelling.  Gastrointestinal: Negative.  Negative for abdominal pain, blood in stool, constipation, diarrhea, melena, nausea and vomiting.  Genitourinary: Negative.  Negative for dysuria.  Musculoskeletal: Negative.  Negative for back pain.  Skin: Negative.  Negative for rash.  Neurological:  Positive for weakness. Negative for dizziness, focal weakness and headaches.  Psychiatric/Behavioral: Negative.  The patient is not nervous/anxious.     As per HPI. Otherwise, a complete review of systems is negative.  PAST MEDICAL HISTORY: Past Medical History:  Diagnosis Date   Anemia    Anxiety    Asthma    Cancer ([place])    colon   CHF (congestive heart failure) (HCC)    COPD (chronic obstructive pulmonary disease) ([place])    Diabetes mellitus without complication ([place])    Hyperlipidemia    Hypertension    Stroke ([place])     PAST SURGICAL  HISTORY: Past Surgical History:  Procedure Laterality Date   COLONOSCOPY WITH PROPOFOL N/A 02/03/2022   Procedure: COLONOSCOPY WITH PROPOFOL;  Surgeon: Lin Landsman, MD;  Location: [facility] ENDOSCOPY;  Service: Gastroenterology;  Laterality: N/A;   ESOPHAGOGASTRODUODENOSCOPY (EGD) WITH PROPOFOL N/A 02/03/2022   Procedure: ESOPHAGOGASTRODUODENOSCOPY (EGD) WITH PROPOFOL;  Surgeon: Lin Landsman, MD;  Location: [facility] ENDOSCOPY;  Service: Gastroenterology;  Laterality: N/A;   GIVENS CAPSULE STUDY N/A 02/03/2022   Procedure: GIVENS CAPSULE STUDY;  Surgeon: Lin Landsman, MD;  Location: [facility] ENDOSCOPY;  Service: Gastroenterology;  Laterality: N/A;  possible Capsule Study   IR IMAGING GUIDED PORT INSERTION  06/18/2022    FAMILY HISTORY: History reviewed. No pertinent family history.  ADVANCED DIRECTIVES (Y/N):  N  HEALTH MAINTENANCE: Social History   Tobacco Use   Smoking status: Every Day    Types: E-cigarettes  Substance Use Topics   Alcohol use: Yes    Comment: rare     Colonoscopy:  PAP:  Bone density:  Lipid panel:  Allergies  Allergen Reactions   Codeine Rash    Happened in 1970s    Current Outpatient Medications  Medication Sig Dispense Refill   acetaminophen (TYLENOL) 325 MG tablet Take 650 mg by mouth every 6 (six) hours as needed.     albuterol (VENTOLIN HFA) 108 (90 Base) MCG/ACT inhaler Inhale into the lungs.     amoxicillin-clavulanate (AUGMENTIN) 875-125 MG tablet Take 1 tablet by mouth 2 (two) times daily for 5 days. 10 tablet 0   atorvastatin (LIPITOR) 40 MG tablet Take 40 mg by mouth daily.     fenofibrate 54 MG  tablet Take 54 mg by mouth daily.     gabapentin (NEURONTIN) 600 MG tablet Take by mouth 2 (two) times daily.     insulin lispro (HUMALOG) 100 UNIT/ML injection Inject 1-10 Units into the skin 3 (three) times daily before meals. Per sliding scale     LANTUS SOLOSTAR 100 UNIT/ML Solostar Pen Inject 12 Units into the skin at bedtime.      metoprolol succinate (TOPROL-XL) 25 MG 24 hr tablet Take 1 tablet (25 mg total) by mouth daily. 30 tablet 0   pantoprazole (PROTONIX) 40 MG tablet Take 1 tablet (40 mg total) by mouth daily. 30 tablet 5   ranolazine (RANEXA) 500 MG 12 hr tablet Take 500 mg by mouth 2 (two) times daily.     sitaGLIPtin-metformin (JANUMET) 50-1000 MG tablet Take 1 tablet by mouth 2 (two) times daily with a meal.     venlafaxine XR (EFFEXOR-XR) 75 MG 24 hr capsule Take 75 mg by mouth daily.     No current facility-administered medications for this visit.   Facility-Administered Medications Ordered in Other Visits  Medication Dose Route Frequency Provider Last Rate Last Admin   heparin lock flush 100 unit/mL  500 Units Intravenous Once Lloyd Huger, MD       magnesium sulfate IVPB 4 g 100 mL  4 g Intravenous Once Lloyd Huger, MD 50 mL/hr at 10/17/22 1107 4 g at 10/17/22 1107   sodium chloride flush (NS) 0.9 % injection 10 mL  10 mL Intracatheter PRN Lloyd Huger, MD   10 mL at 10/09/22 1353    OBJECTIVE: Vitals:   10/17/22 0924  BP: 128/73  Pulse: (!) 105  Resp: 18  Temp: 97.8 F (36.6 C)  SpO2: 97%      Body mass index is 23.6 kg/m.    ECOG FS:1 - Symptomatic but completely ambulatory  General: Well-developed, well-nourished, no acute distress.  Sitting in a wheelchair. Eyes: Pink conjunctiva, anicteric sclera. HEENT: Normocephalic, moist mucous membranes. Lungs: No audible wheezing or coughing. Heart: Regular rate and rhythm. Abdomen: Soft, nontender, no obvious distention. Musculoskeletal: No edema, cyanosis, or clubbing. Neuro: Alert, answering all questions appropriately. Cranial nerves grossly intact. Skin: No rashes or petechiae noted. Psych: Normal affect.  LAB RESULTS:  Lab Results  Component Value Date   NA 138 10/17/2022   K 3.7 10/17/2022   CL 104 10/17/2022   CO2 25 10/17/2022   GLUCOSE 134 (H) 10/17/2022   BUN 12 10/17/2022   CREATININE 0.64 10/17/2022    CALCIUM 8.9 10/17/2022   PROT 7.0 10/17/2022   ALBUMIN 3.1 (L) 10/17/2022   AST 19 10/17/2022   ALT 15 10/17/2022   ALKPHOS 59 10/17/2022   BILITOT 0.6 10/17/2022   GFRNONAA >60 10/17/2022    Lab Results  Component Value Date   WBC 7.6 10/17/2022   NEUTROABS 5.8 10/17/2022   HGB 8.8 (L) 10/17/2022   HCT 28.1 (L) 10/17/2022   MCV 88.6 10/17/2022   PLT 149 (L) 10/17/2022     STUDIES: US Venous Img Lower Bilateral  Result Date: 10/11/2022 CLINICAL DATA:  Leg pain. EXAM: BILATERAL LOWER EXTREMITY VENOUS DOPPLER ULTRASOUND TECHNIQUE: Gray-scale sonography with compression, as well as color and duplex ultrasound, were performed to evaluate the deep venous system(s) from the level of the common femoral vein through the popliteal and proximal calf veins. COMPARISON:  None Available. FINDINGS: VENOUS Normal compressibility of the common femoral, superficial femoral, and popliteal veins, as well as the visualized calf veins. Visualized  portions of profunda femoral vein and great saphenous vein unremarkable. No filling defects to suggest DVT on grayscale or color Doppler imaging. Doppler waveforms show normal direction of venous flow, normal respiratory plasticity and response to augmentation. Limited views of the contralateral common femoral vein are unremarkable. OTHER None. Limitations: none IMPRESSION: Negative. Electronically Signed   By: Keane Police D.O.   On: 10/11/2022 13:17   CT CHEST ABDOMEN PELVIS WO CONTRAST  Result Date: 10/11/2022 CLINICAL DATA:  Abdominal pain.  History of colon carcinoma. EXAM: CT CHEST, ABDOMEN AND PELVIS WITHOUT CONTRAST TECHNIQUE: Multidetector CT imaging of the chest, abdomen and pelvis was performed following the standard protocol without IV contrast. RADIATION DOSE REDUCTION: This exam was performed according to the departmental dose-optimization program which includes automated exposure control, adjustment of the mA and/or kV according to patient size  and/or use of iterative reconstruction technique. COMPARISON:  08/29/2022, CT chest. CT abdomen pelvis, 02/03/2022. PET-CT, 02/27/2022. FINDINGS: CT CHEST FINDINGS Cardiovascular: Heart mildly enlarged. Three-vessel coronary artery calcifications. No pericardial effusion. Great vessels are normal in caliber. Mild aortic atherosclerosis. Mediastinum/Nodes: No neck base, mediastinal or hilar masses or enlarged lymph nodes. Trachea and esophagus are unremarkable. Lungs/Pleura: Bilateral lung base opacities, mostly in the lower lobes. Additional smaller peripheral opacities are noted in both upper lobes and in the right middle lobe, findings new since the prior CT. Small stable focus of linear type opacity in the posterior left lower lobe, superior segment. Stable 4 mm nodule, posterior left upper lobe, image 45, series 4. Mild changes of centrilobular emphysema. No evidence of pulmonary edema. No pleural effusion or pneumothorax. No discrete lung mass or new nodule. Musculoskeletal: No fracture or acute finding. No osteoblastic or osteolytic lesions. No chest wall mass. CT ABDOMEN PELVIS FINDINGS Hepatobiliary: Liver normal in overall size. Small low-density lesion, inferior aspect of segment 6, stable and consistent with a cyst. No other liver masses or lesions. Single dependent gallstone. No evidence of acute cholecystitis. No bile duct dilation. Pancreas: Unremarkable. No pancreatic ductal dilatation or surrounding inflammatory changes. Spleen: Normal in size without focal abnormality. Adrenals/Urinary Tract: Heterogeneous left adrenal mass, containing macroscopic fat, 3.1 cm in size, stable. Stable mild right adrenal gland thickening. Stable cyst from the upper pole the right kidney. No other renal masses, no stones and no hydronephrosis. Normal ureters. Bladder is decompressed, otherwise unremarkable. Stomach/Bowel: Bowel anastomosis staples noted along the anterior and inferior aspect of the stomach. No stomach  mass or wall thickening. Stomach is mostly decompressed. Small bowel and colonic anastomosis staples and adjacent vascular clips are new since the prior exam. No evidence of bowel obstruction or inflammation. No bowel mass. Vascular/Lymphatic: Dense aortic atherosclerosis. No aneurysm. Prominent gastrohepatic ligament lymph nodes measuring up to 9 mm in short axis. No enlarged intraperitoneal, retroperitoneal or pelvic lymph nodes. Reproductive: Unremarkable. Other: Radiopaque mesh extends along the anterior abdominal wall. Midline incision, well-healed. No abdominal wall mass. No hernia. No ascites. Musculoskeletal: No fracture or acute finding.  No bone lesion. IMPRESSION: 1. There are new areas of lung opacity, at the lung bases, mostly in the lower lobes, and peripheral aspects the upper and right middle lobes. These areas may all be atelectasis. Consider pneumonia if there are consistent clinical findings. 2. Small linear opacity in the left lower lobe superior segment, the larger nodular opacity on prior studies, is stable from the most recent prior exam. Stable ill-defined 4 mm left upper lobe nodule. No new lung nodules. No lung masses. 3. Colon surgery has been  performed since the prior study. Previously seen transverse colon mass is been resected. There new stomach and bowel anastomosis staples. No evidence of bowel obstruction or inflammation. 4. Stable left adrenal mass containing macroscopic fat. 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 No other evidence of metastatic disease within the abdomen or pelvis. 5. No acute abnormalities within the abdomen or pelvis. Electronically Signed   By: Lajean Manes M.D.   On: 10/11/2022 12:00   CT HEAD WO CONTRAST (5MM)  Result Date: 10/11/2022 CLINICAL DATA:  Head trauma.  Found unresponsive. EXAM: CT HEAD WITHOUT CONTRAST TECHNIQUE: Contiguous axial images were obtained from the base of the  skull through the vertex without intravenous contrast. RADIATION DOSE REDUCTION: This exam was performed according to the departmental dose-optimization program which includes automated exposure control, adjustment of the mA and/or kV according to patient size and/or use of iterative reconstruction technique. COMPARISON:  None Available. FINDINGS: Brain: There is no evidence for acute hemorrhage, hydrocephalus, mass lesion, or abnormal extra-axial fluid collection. No definite CT evidence for acute infarction. Vascular: No hyperdense vessel or unexpected calcification. Skull: No evidence for fracture. No worrisome lytic or sclerotic lesion. Sinuses/Orbits: Mucosal thickening with air-fluid level noted right sphenoid sinus. Maxillary, frontal, and mastoid sinuses are clear. Other: None. IMPRESSION: 1. No acute intracranial abnormality. 2. 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 Electronically Signed   By: Misty Stanley M.D.   On: 10/11/2022 11:46   DG Chest Port 1 View  Result Date: 10/11/2022 CLINICAL DATA:  Questionable sepsis - evaluate for abnormality EXAM: PORTABLE CHEST 1 VIEW COMPARISON:  10/07/2022 FINDINGS: Right Port-A-Cath remains in place, unchanged. Heart is normal size. Bibasilar airspace opacities, favor atelectasis. Possible small bilateral effusions. No acute bony abnormality. IMPRESSION: Worsening bibasilar opacities, likely atelectasis. Question small bilateral effusions. Electronically Signed   By: Rolm Baptise M.D.   On: 10/11/2022 08:27   DG Chest 2 View  Result Date: 10/10/2022 CLINICAL DATA:  Shortness of breath and ankle swelling. EXAM: CHEST - 2 VIEW COMPARISON:  July 22, 2022 FINDINGS: Injectable port in stable position. Cardiomediastinal silhouette is normal. Mediastinal contours appear intact. Streaky airspace opacities in bilateral lower lobes, left greater than right. Mild pulmonary vascular congestion. Osseous structures are without acute abnormality. Soft tissues are  grossly normal. IMPRESSION: Streaky airspace opacities in bilateral lower lobes, left greater than right, may represent atelectasis versus airspace consolidation. Mild pulmonary vascular congestion. Electronically Signed   By: Fidela Salisbury M.D.   On: 10/10/2022 17:53    ASSESSMENT: Stage IIIc colon cancer.  PLAN:    Stage IIIc colon cancer: Patient underwent complete surgical resection at [facility] on May 23, 2022.  Large colon mass was noted to partially invade stomach as well as surrounding mesentery.  PET scan did not reveal any metastatic disease other than a positive lung nodule which was confirmed to be a second primary.  Patient's preop CEA was 171.  Patient will require adjuvant chemotherapy using FOLFOX every 2 weeks x 12.  Patient has now had port placement.  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  Patient completed XRT in May 2023.  CT scan results from September 01, 2022 reviewed independently and reported as above with partial regression of nodule indicating  treatment effect.  Repeat CT scan of chest in approximately April 2024. Pain: Resolved. Anemia: Hemoglobin has trended down slightly to 8.8, monitor. Thrombocytopenia: Essentially resolved. Hyperglycemia: Improved.   Hypokalemia: Resolved.  Continue oral potassium supplementation.   Neutropenia: Resolved.  May need to consider Udenyca with subsequent treatments. Shortness of breath/peripheral edema: Improved.  Patient was previously given a referral to cardiology. Hypomagnesia: Patient will receive 4 g of magnesium today.  Patient expressed understanding and was in agreement with this plan. She also understands that She can call clinic at any time with any questions, concerns, or  complaints.    Cancer Staging  Colon cancer [facility]) Staging form: Colon and Rectum, AJCC 8th Edition - Pathologic stage from 06/10/2022: Stage IIIC (pT4b, pN1c, cM0) - Signed by Lloyd Huger, MD on 06/10/2022 Stage prefix: Initial diagnosis Total positive nodes: 0 Histologic grading system: 4 grade system Histologic grade (G): G3   Lloyd Huger, MD   10/17/2022 12:15 PM

## 2022-10-17 NOTE — Patient Instructions (Signed)
Hypomagnesemia Hypomagnesemia is a condition in which the level of magnesium in the blood is too low. Magnesium is a mineral that is found in many foods. It is used in many different processes in the body. Hypomagnesemia can affect every organ in the body. In severe cases, it can cause life-threatening problems. What are the causes? This condition may be caused by: Not getting enough magnesium in your diet or not having enough healthy foods to eat (malnutrition). Problems with magnesium absorption in the intestines. Dehydration. Excessive use of alcohol. Vomiting. Severe or long-term (chronic) diarrhea. Some medicines, including medicines that make you urinate more often (diuretics). Certain diseases, such as kidney disease, diabetes, celiac disease, and overactive thyroid. What are the signs or symptoms? Symptoms of this condition include: Loss of appetite, nausea, and vomiting. Involuntary shaking or trembling of a body part (tremor). Muscle weakness or tingling in the arms and legs. Sudden tightening of muscles (muscle spasms). Confusion. Psychiatric issues, such as: Depression and irritability. Psychosis. A feeling of fluttering of the heart (palpitations). Seizures. These symptoms are more severe if magnesium levels drop suddenly. How is this diagnosed? This condition may be diagnosed based on: Your symptoms and medical history. A physical exam. Blood and urine tests. How is this treated? Treatment depends on the cause and the severity of the condition. It may be treated by: Taking a magnesium supplement. This can be taken in pill form. If the condition is severe, magnesium is usually given through an IV. Making changes to your diet. You may be directed to eat foods that have a lot of magnesium, such as green leafy vegetables, peas, beans, and nuts. Not drinking alcohol. If you are struggling not to drink, ask your health care provider for help. Follow these instructions at  home: Eating and drinking     Make sure that your diet includes foods with magnesium. Foods that have a lot of magnesium in them include: Green leafy vegetables, such as spinach and broccoli. Beans and peas. Nuts and seeds, such as almonds and sunflower seeds. Whole grains, such as whole grain bread and fortified cereals. Drink fluids that contain salts and minerals (electrolytes), such as sports drinks, when you are active. Do not drink alcohol. General instructions Take over-the-counter and prescription medicines only as told by your health care provider. Take magnesium supplements as directed if your health care provider tells you to take them. Have your magnesium levels monitored as told by your health care provider. Keep all follow-up visits. This is important. Contact a health care provider if: You get worse instead of better. Your symptoms return. Get help right away if: You develop severe muscle weakness. You have trouble breathing. You feel that your heart is racing. These symptoms may represent a serious problem that is an emergency. Do not wait to see if the symptoms will go away. Get medical help right away. Call your local emergency services (911 in the [place] Do not drive yourself to the hospital. Summary Hypomagnesemia is a condition in which the level of magnesium in the blood is too low. Hypomagnesemia can affect every organ in the body. Treatment may include eating more foods that contain magnesium, taking magnesium supplements, and not drinking alcohol. Have your magnesium levels monitored as told by your health care provider. This information is not intended to replace advice given to you by your health care provider. Make sure you discuss any questions you have with your health care provider. Document Revised: 04/16/2021 Document Reviewed: 04/16/2021 Elsevier Patient Education    2023 Elsevier Inc.  

## 2022-10-21 ENCOUNTER — Ambulatory Visit: Payer: Medicare Other

## 2022-10-21 ENCOUNTER — Other Ambulatory Visit: Payer: Medicare Other

## 2022-10-21 ENCOUNTER — Ambulatory Visit: Payer: Medicare Other | Admitting: Oncology

## 2022-10-27 ENCOUNTER — Ambulatory Visit: Payer: Medicare Other

## 2022-10-27 ENCOUNTER — Other Ambulatory Visit: Payer: Medicare Other

## 2022-10-27 ENCOUNTER — Ambulatory Visit: Payer: Medicare Other | Admitting: Oncology

## 2022-10-27 MED FILL — Dexamethasone Sodium Phosphate Inj 100 MG/10ML: INTRAMUSCULAR | Qty: 1 | Status: AC

## 2022-10-28 ENCOUNTER — Ambulatory Visit: Payer: Medicare Other | Admitting: Oncology

## 2022-10-28 ENCOUNTER — Ambulatory Visit: Payer: Medicare Other

## 2022-10-28 ENCOUNTER — Other Ambulatory Visit: Payer: Medicare Other

## 2022-10-28 ENCOUNTER — Encounter: Payer: Self-pay | Admitting: Oncology

## 2022-10-28 ENCOUNTER — Inpatient Hospital Stay: Payer: Medicare Other

## 2022-10-28 ENCOUNTER — Inpatient Hospital Stay (HOSPITAL_BASED_OUTPATIENT_CLINIC_OR_DEPARTMENT_OTHER): Payer: Medicare Other | Admitting: Oncology

## 2022-10-28 VITALS — BP 103/64 | HR 98 | Temp 98.9°F | Resp 18 | Wt 145.0 lb

## 2022-10-28 DIAGNOSIS — C184 Malignant neoplasm of transverse colon: Secondary | ICD-10-CM

## 2022-10-28 DIAGNOSIS — C189 Malignant neoplasm of colon, unspecified: Secondary | ICD-10-CM

## 2022-10-28 DIAGNOSIS — Z5111 Encounter for antineoplastic chemotherapy: Secondary | ICD-10-CM | POA: Diagnosis not present

## 2022-10-28 LAB — CBC WITH DIFFERENTIAL/PLATELET
Abs Immature Granulocytes: 0.04 10*3/uL (ref 0.00–0.07)
Basophils Absolute: 0 10*3/uL (ref 0.0–0.1)
Basophils Relative: 1 %
Eosinophils Absolute: 0.1 10*3/uL (ref 0.0–0.5)
Eosinophils Relative: 1 %
HCT: 30 % — ABNORMAL LOW (ref 36.0–46.0)
Hemoglobin: 9.2 g/dL — ABNORMAL LOW (ref 12.0–15.0)
Immature Granulocytes: 1 %
Lymphocytes Relative: 14 %
Lymphs Abs: 1 10*3/uL (ref 0.7–4.0)
MCH: 28 pg (ref 26.0–34.0)
MCHC: 30.7 g/dL (ref 30.0–36.0)
MCV: 91.5 fL (ref 80.0–100.0)
Monocytes Absolute: 0.6 10*3/uL (ref 0.1–1.0)
Monocytes Relative: 9 %
Neutro Abs: 4.9 10*3/uL (ref 1.7–7.7)
Neutrophils Relative %: 74 %
Platelets: 183 10*3/uL (ref 150–400)
RBC: 3.28 MIL/uL — ABNORMAL LOW (ref 3.87–5.11)
RDW: 20.5 % — ABNORMAL HIGH (ref 11.5–15.5)
WBC: 6.7 10*3/uL (ref 4.0–10.5)
nRBC: 0 % (ref 0.0–0.2)

## 2022-10-28 LAB — COMPREHENSIVE METABOLIC PANEL
ALT: 9 U/L (ref 0–44)
AST: 18 U/L (ref 15–41)
Albumin: 3 g/dL — ABNORMAL LOW (ref 3.5–5.0)
Alkaline Phosphatase: 50 U/L (ref 38–126)
Anion gap: 8 (ref 5–15)
BUN: 19 mg/dL (ref 8–23)
CO2: 27 mmol/L (ref 22–32)
Calcium: 8.6 mg/dL — ABNORMAL LOW (ref 8.9–10.3)
Chloride: 102 mmol/L (ref 98–111)
Creatinine, Ser: 0.68 mg/dL (ref 0.44–1.00)
GFR, Estimated: >60 mL/min (ref >60)
Glucose, Bld: 103 mg/dL — ABNORMAL HIGH (ref 70–99)
Potassium: 4.5 mmol/L (ref 3.5–5.1)
Sodium: 137 mmol/L (ref 135–145)
Total Bilirubin: 0.4 mg/dL (ref 0.3–1.2)
Total Protein: 6.6 g/dL (ref 6.5–8.1)

## 2022-10-28 LAB — MAGNESIUM: Magnesium: 1.4 mg/dL — ABNORMAL LOW (ref 1.7–2.4)

## 2022-10-28 MED ORDER — SODIUM CHLORIDE 0.9% FLUSH
10.0000 mL | INTRAVENOUS | Status: DC | PRN
  Administered 2022-10-28: 10 mL via INTRAVENOUS
  Filled 2022-10-28: qty 10

## 2022-10-28 MED ORDER — MAGNESIUM SULFATE 2 GM/50ML IV SOLN
2.0000 g | Freq: Once | INTRAVENOUS | Status: AC
  Administered 2022-10-28: 2 g via INTRAVENOUS
  Filled 2022-10-28: qty 50

## 2022-10-28 MED ORDER — SODIUM CHLORIDE 0.9 % IV SOLN
10.0000 mg | Freq: Once | INTRAVENOUS | Status: AC
  Administered 2022-10-28: 10 mg via INTRAVENOUS
  Filled 2022-10-28: qty 10

## 2022-10-28 MED ORDER — PALONOSETRON HCL INJECTION 0.25 MG/5ML
0.2500 mg | Freq: Once | INTRAVENOUS | Status: AC
  Administered 2022-10-28: 0.25 mg via INTRAVENOUS
  Filled 2022-10-28: qty 5

## 2022-10-28 MED ORDER — FLUOROURACIL CHEMO INJECTION 2.5 GM/50ML
400.0000 mg/m2 | Freq: Once | INTRAVENOUS | Status: AC
  Administered 2022-10-28: 700 mg via INTRAVENOUS
  Filled 2022-10-28: qty 14

## 2022-10-28 MED ORDER — DEXTROSE 5 % IV SOLN
Freq: Once | INTRAVENOUS | Status: AC
  Filled 2022-10-28: qty 250

## 2022-10-28 MED ORDER — SODIUM CHLORIDE 0.9 % IV SOLN
2400.0000 mg/m2 | INTRAVENOUS | Status: DC
  Administered 2022-10-28: 4100 mg via INTRAVENOUS
  Filled 2022-10-28: qty 82

## 2022-10-28 MED ORDER — LEUCOVORIN CALCIUM INJECTION 350 MG
400.0000 mg/m2 | Freq: Once | INTRAVENOUS | Status: AC
  Administered 2022-10-28: 684 mg via INTRAVENOUS
  Filled 2022-10-28: qty 34.2

## 2022-10-28 MED ORDER — OXALIPLATIN CHEMO INJECTION 100 MG/20ML
85.0000 mg/m2 | Freq: Once | INTRAVENOUS | Status: AC
  Administered 2022-10-28: 145 mg via INTRAVENOUS
  Filled 2022-10-28: qty 10

## 2022-10-28 NOTE — Progress Notes (Signed)
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  INTERVAL HISTORY: Patient returns to clinic today for further evaluation and consideration of cycle 8 of adjuvant FOLFOX.  She continues to have weakness and fatigue.  She is also worried about some mild fluid retention and scant edema.  She otherwise feels well. She has no neurologic complaints.  She denies any recent fevers.  She has no chest pain, cough, shortness of breath, or hemoptysis, although she does admit to using her oxygen at night.  She denies any nausea, vomiting, constipation, or diarrhea.  She has no melena or hematochezia.  Patient offers no further specific complaints today.  REVIEW OF SYSTEMS:   Review of Systems  Constitutional:  Positive for malaise/fatigue. Negative for fever and weight loss.  Respiratory: Negative.  Negative for cough, hemoptysis and shortness of breath.   Cardiovascular: Negative.  Negative for chest pain and leg swelling.  Gastrointestinal: Negative.  Negative for abdominal pain, blood in stool, constipation, diarrhea, melena, nausea and vomiting.  Genitourinary: Negative.  Negative for dysuria.  Musculoskeletal: Negative.  Negative for back pain.  Skin: Negative.  Negative for rash.  Neurological:  Positive for weakness. Negative for dizziness, focal weakness and headaches.  Psychiatric/Behavioral: Negative.  The patient is not nervous/anxious.     As per HPI. Otherwise, a complete review of systems is negative.  PAST MEDICAL HISTORY: Past Medical History:  Diagnosis Date   Anemia    Anxiety    Asthma    Cancer ([place])    colon   CHF (congestive heart failure) (H[facility])    COPD (chronic  obstructive pulmonary disease) ([place])    Diabetes mellitus without complication ([place])    Hyperlipidemia    Hypertension    Stroke ([place])     PAST SURGICAL HISTORY: Past Surgical History:  Procedure Laterality Date   COLONOSCOPY WITH PROPOFOL N/A 02/03/2022   Procedure: COLONOSCOPY WITH PROPOFOL;  Surgeon: Lin Landsman, MD;  Location: [facility] ENDOSCOPY;  Service: Gastroenterology;  Laterality: N/A;   ESOPHAGOGASTRODUODENOSCOPY (EGD) WITH PROPOFOL N/A 02/03/2022   Procedure: ESOPHAGOGASTRODUODENOSCOPY (EGD) WITH PROPOFOL;  Surgeon: Lin Landsman, MD;  Location[facility] ENDOSCOPY;  Service: Gastroenterology;  Laterality: N/A;   GIVENS CAPSULE STUDY N/A 02/03/2022   Procedure: GIVENS CAPSULE STUDY;  Surgeon: Lin Landsman, MD;  Location: [facility] ENDOSCOPY;  Service: Gastroenterology;  Laterality: N/A;  possible Capsule Study   IR IMAGING GUIDED PORT INSERTION  06/18/2022    FAMILY HISTORY: History reviewed. No pertinent family history.  ADVANCED DIRECTIVES (Y/N):  N  HEALTH MAINTENANCE: Social History   Tobacco Use   Smoking status: Every Day    Types: E-cigarettes  Substance Use Topics   Alcohol use: Yes    Comment: rare     Colonoscopy:  PAP:  Bone density:  Lipid panel:  Allergies  Allergen Reactions   Codeine Rash    Happened in 1970s    Current Outpatient Medications  Medication Sig Dispense Refill   acetaminophen (TYLENOL) 325 MG tablet Take 650 mg by mouth every 6 (six) hours as needed.     albuterol (VENTOLIN HFA) 108 (90 Base) MCG/ACT inhaler Inhale into the lungs.     atorvastatin (LIPITOR) 40 MG tablet Take 40 mg  by mouth daily.     fenofibrate 54 MG tablet Take 54 mg by mouth daily.     gabapentin (NEURONTIN) 600 MG tablet Take by mouth 2 (two) times daily.     insulin lispro (HUMALOG) 100 UNIT/ML injection Inject 1-10 Units into the skin 3 (three) times daily before meals. Per sliding scale     LANTUS SOLOSTAR 100 UNIT/ML Solostar Pen Inject 12 Units  into the skin at bedtime.     lisinopril (ZESTRIL) 20 MG tablet Take 20 mg by mouth daily.     Magnesium Citrate 200 MG TABS Take 1 tablet by mouth in the morning and at bedtime.     metoprolol succinate (TOPROL-XL) 50 MG 24 hr tablet Take 50 mg by mouth daily. Take with or immediately following a meal.     pantoprazole (PROTONIX) 40 MG tablet Take 1 tablet (40 mg total) by mouth daily. 30 tablet 5   ranolazine (RANEXA) 500 MG 12 hr tablet Take 500 mg by mouth 2 (two) times daily.     sitaGLIPtin-metformin (JANUMET) 50-1000 MG tablet Take 1 tablet by mouth 2 (two) times daily with a meal.     venlafaxine XR (EFFEXOR-XR) 75 MG 24 hr capsule Take 75 mg by mouth daily.     No current facility-administered medications for this visit.   Facility-Administered Medications Ordered in Other Visits  Medication Dose Route Frequency Provider Last Rate Last Admin   dexamethasone (DECADRON) 10 mg in sodium chloride 0.9 % 50 mL IVPB  10 mg Intravenous Once Grayland Ormond, Kathlene November, MD       dextrose 5 % solution   Intravenous Once Grayland Ormond, Kathlene November, MD       fluorouracil (ADRUCIL) 4,100 mg in sodium chloride 0.9 % 68 mL chemo infusion  2,400 mg/m2 (Treatment Plan Recorded) Intravenous 1 day or 1 dose Grayland Ormond, Kathlene November, MD       fluorouracil (ADRUCIL) chemo injection 700 mg  400 mg/m2 (Treatment Plan Recorded) Intravenous Once Lloyd Huger, MD       leucovorin 684 mg in dextrose 5 % 250 mL infusion  400 mg/m2 (Treatment Plan Recorded) Intravenous Once Lloyd Huger, MD       magnesium sulfate IVPB 2 g 50 mL  2 g Intravenous Once Lloyd Huger, MD       oxaliplatin (ELOXATIN) 145 mg in dextrose 5 % 500 mL chemo infusion  85 mg/m2 (Treatment Plan Recorded) Intravenous Once Lloyd Huger, MD       palonosetron (ALOXI) injection 0.25 mg  0.25 mg Intravenous Once Lloyd Huger, MD       sodium chloride flush (NS) 0.9 % injection 10 mL  10 mL Intracatheter PRN Lloyd Huger, MD    10 mL at 10/09/22 1353   sodium chloride flush (NS) 0.9 % injection 10 mL  10 mL Intravenous PRN Lloyd Huger, MD   10 mL at 10/28/22 0833    OBJECTIVE: Vitals:   10/28/22 0858  BP: 103/64  Pulse: 98  Resp: 18  Temp: 98.9 F (37.2 C)  SpO2: 94%      Body mass index is 24.89 kg/m.    ECOG FS:1 - Symptomatic but completely ambulatory  General: Well-developed, well-nourished, no acute distress.  Sitting in a wheelchair. Eyes: Pink conjunctiva, anicteric sclera. HEENT: Normocephalic, moist mucous membranes. Lungs: No audible wheezing or coughing. Heart: Regular rate and rhythm. Abdomen: Soft, nontender, no obvious distention. Musculoskeletal: No edema. Neuro: Alert, answering all questions appropriately. Cranial  nerves grossly intact. Skin: No rashes or petechiae noted. Psych: Normal affect.   LAB RESULTS:  Lab Results  Component Value Date   NA 137 10/28/2022   K 4.5 10/28/2022   CL 102 10/28/2022   CO2 27 10/28/2022   GLUCOSE 103 (H) 10/28/2022   BUN 19 10/28/2022   CREATININE 0.68 10/28/2022   CALCIUM 8.6 (L) 10/28/2022   PROT 6.6 10/28/2022   ALBUMIN 3.0 (L) 10/28/2022   AST 18 10/28/2022   ALT 9 10/28/2022   ALKPHOS 50 10/28/2022   BILITOT 0.4 10/28/2022   GFRNONAA >60 10/28/2022    Lab Results  Component Value Date   WBC 6.7 10/28/2022   NEUTROABS 4.9 10/28/2022   HGB 9.2 (L) 10/28/2022   HCT 30.0 (L) 10/28/2022   MCV 91.5 10/28/2022   PLT 183 10/28/2022     STUDIES: US Venous Img Lower Bilateral  Result Date: 10/11/2022 CLINICAL DATA:  Leg pain. EXAM: BILATERAL LOWER EXTREMITY VENOUS DOPPLER ULTRASOUND TECHNIQUE: Gray-scale sonography with compression, as well as color and duplex ultrasound, were performed to evaluate the deep venous system(s) from the level of the common femoral vein through the popliteal and proximal calf veins. COMPARISON:  None Available. FINDINGS: VENOUS Normal compressibility of the common femoral, superficial femoral,  and popliteal veins, as well as the visualized calf veins. Visualized portions of profunda femoral vein and great saphenous vein unremarkable. No filling defects to suggest DVT on grayscale or color Doppler imaging. Doppler waveforms show normal direction of venous flow, normal respiratory plasticity and response to augmentation. Limited views of the contralateral common femoral vein are unremarkable. OTHER None. Limitations: none IMPRESSION: Negative. Electronically Signed   By: Keane Police D.O.   On: 10/11/2022 13:17   CT CHEST ABDOMEN PELVIS WO CONTRAST  Result Date: 10/11/2022 CLINICAL DATA:  Abdominal pain.  History of colon carcinoma. EXAM: CT CHEST, ABDOMEN AND PELVIS WITHOUT CONTRAST TECHNIQUE: Multidetector CT imaging of the chest, abdomen and pelvis was performed following the standard protocol without IV contrast. RADIATION DOSE REDUCTION: This exam was performed according to the departmental dose-optimization program which includes automated exposure control, adjustment of the mA and/or kV according to patient size and/or use of iterative reconstruction technique. COMPARISON:  08/29/2022, CT chest. CT abdomen pelvis, 02/03/2022. PET-CT, 02/27/2022. FINDINGS: CT CHEST FINDINGS Cardiovascular: Heart mildly enlarged. Three-vessel coronary artery calcifications. No pericardial effusion. Great vessels are normal in caliber. Mild aortic atherosclerosis. Mediastinum/Nodes: No neck base, mediastinal or hilar masses or enlarged lymph nodes. Trachea and esophagus are unremarkable. Lungs/Pleura: Bilateral lung base opacities, mostly in the lower lobes. Additional smaller peripheral opacities are noted in both upper lobes and in the right middle lobe, findings new since the prior CT. Small stable focus of linear type opacity in the posterior left lower lobe, superior segment. Stable 4 mm nodule, posterior left upper lobe, image 45, series 4. Mild changes of centrilobular emphysema. No evidence of pulmonary  edema. No pleural effusion or pneumothorax. No discrete lung mass or new nodule. Musculoskeletal: No fracture or acute finding. No osteoblastic or osteolytic lesions. No chest wall mass. CT ABDOMEN PELVIS FINDINGS Hepatobiliary: Liver normal in overall size. Small low-density lesion, inferior aspect of segment 6, stable and consistent with a cyst. No other liver masses or lesions. Single dependent gallstone. No evidence of acute cholecystitis. No bile duct dilation. Pancreas: Unremarkable. No pancreatic ductal dilatation or surrounding inflammatory changes. Spleen: Normal in size without focal abnormality. Adrenals/Urinary Tract: Heterogeneous left adrenal mass, containing macroscopic fat, 3.1 cm in size,  stable. Stable mild right adrenal gland thickening. Stable cyst from the upper pole the right kidney. No other renal masses, no stones and no hydronephrosis. Normal ureters. Bladder is decompressed, otherwise unremarkable. Stomach/Bowel: Bowel anastomosis staples noted along the anterior and inferior aspect of the stomach. No stomach mass or wall thickening. Stomach is mostly decompressed. Small bowel and colonic anastomosis staples and adjacent vascular clips are new since the prior exam. No evidence of bowel obstruction or inflammation. No bowel mass. Vascular/Lymphatic: Dense aortic atherosclerosis. No aneurysm. Prominent gastrohepatic ligament lymph nodes measuring up to 9 mm in short axis. No enlarged intraperitoneal, retroperitoneal or pelvic lymph nodes. Reproductive: Unremarkable. Other: Radiopaque mesh extends along the anterior abdominal wall. Midline incision, well-healed. No abdominal wall mass. No hernia. No ascites. Musculoskeletal: No fracture or acute finding.  No bone lesion. IMPRESSION: 1. There are new areas of lung opacity, at the lung bases, mostly in the lower lobes, and peripheral aspects the upper and right middle lobes. These areas may all be atelectasis. Consider pneumonia if there are  consistent clinical findings. 2. Small linear opacity in the left lower lobe superior segment, the larger nodular opacity on prior studies, is stable from the most recent prior exam. Stable ill-defined 4 mm left upper lobe nodule. No new lung nodules. No lung masses. 3. Colon surgery has been performed since the prior study. Previously seen transverse colon mass is been resected. There new stomach and bowel anastomosis staples. No evidence of bowel obstruction or inflammation. 4. Stable left adrenal mass containing macroscopic fat. 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 No other evidence of metastatic disease within the abdomen or pelvis. 5. No acute abnormalities within the abdomen or pelvis. Electronically Signed   By: Lajean Manes M.D.   On: 10/11/2022 12:00   CT HEAD WO CONTRAST (5MM)  Result Date: 10/11/2022 CLINICAL DATA:  Head trauma.  Found unresponsive. EXAM: CT HEAD WITHOUT CONTRAST TECHNIQUE: Contiguous axial images were obtained from the base of the skull through the vertex without intravenous contrast. RADIATION DOSE REDUCTION: This exam was performed according to the departmental dose-optimization program which includes automated exposure control, adjustment of the mA and/or kV according to patient size and/or use of iterative reconstruction technique. COMPARISON:  None Available. FINDINGS: Brain: There is no evidence for acute hemorrhage, hydrocephalus, mass lesion, or abnormal extra-axial fluid collection. No definite CT evidence for acute infarction. Vascular: No hyperdense vessel or unexpected calcification. Skull: No evidence for fracture. No worrisome lytic or sclerotic lesion. Sinuses/Orbits: Mucosal thickening with air-fluid level noted right sphenoid sinus. Maxillary, frontal, and mastoid sinuses are clear. Other: None. IMPRESSION: 1. No acute intracranial abnormality. 2. 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  Electronically Signed   By: Misty Stanley M.D.   On: 10/11/2022 11:46   DG Chest Port 1 View  Result Date: 10/11/2022 CLINICAL DATA:  Questionable sepsis - evaluate for abnormality EXAM: PORTABLE CHEST 1 VIEW COMPARISON:  10/07/2022 FINDINGS: Right Port-A-Cath remains in place, unchanged. Heart is normal size. Bibasilar airspace opacities, favor atelectasis. Possible small bilateral effusions. No acute bony abnormality. IMPRESSION: Worsening bibasilar opacities, likely atelectasis. Question small bilateral effusions. Electronically Signed   By: Rolm Baptise M.D.   On: 10/11/2022 08:27   DG Chest 2 View  Result Date: 10/10/2022 CLINICAL DATA:  Shortness of breath and ankle swelling. EXAM: CHEST - 2 VIEW COMPARISON:  July 22, 2022 FINDINGS: Injectable port in stable position. Cardiomediastinal silhouette is normal. Mediastinal contours  appear intact. Streaky airspace opacities in bilateral lower lobes, left greater than right. Mild pulmonary vascular congestion. Osseous structures are without acute abnormality. Soft tissues are grossly normal. IMPRESSION: Streaky airspace opacities in bilateral lower lobes, left greater than right, may represent atelectasis versus airspace consolidation. Mild pulmonary vascular congestion. Electronically Signed   By: Fidela Salisbury M.D.   On: 10/10/2022 17:53    ASSESSMENT: Stage IIIc colon cancer.  PLAN:    Stage IIIc colon cancer: Patient underwent complete surgical resection at [facility] on May 23, 2022.  Large colon mass was noted to partially invade stomach as well as surrounding mesentery.  PET scan did not reveal any metastatic disease other than a positive lung nodule which was confirmed to be a second primary.  Patient's preop CEA was 171.  Patient will require adjuvant chemotherapy using FOLFOX every 2 weeks x 12.  Patient has now had port placement.  Proceed with cycle 8 of treatment today.  Return to clinic in 2 days for pump removal and then  in 2 weeks for further evaluation and consideration of cycle 9.    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  Patient completed XRT in May 2023.  CT scan results from September 01, 2022 reviewed independently with partial regression of nodule indicating treatment effect.  Repeat CT scan of chest in approximately April 2024. Pain: Resolved. Anemia: Chronic and unchanged.  Patient's hemoglobin is 9.2.  Thrombocytopenia: Resolved. Hyperglycemia: Patient has improved blood glucose control.   Hypokalemia: Resolved.  Continue oral potassium supplementation.   Neutropenia: Resolved.  May need to consider Udenyca with subsequent treatments. Shortness of breath/peripheral edema: Improved.  Patient was previously given a referral to cardiology. Hypomagnesia: Patient received 2 g of magnesium today.  Patient expressed understanding and was in agreement with this plan. She also understands that She can call clinic at any time with any questions, concerns, or complaints.    Cancer Staging  Colon cancer [facility]) Staging form: Colon and Rectum, AJCC 8th Edition - Pathologic stage from 06/10/2022: Stage IIIC (pT4b, pN1c, cM0) - Signed by Lloyd Huger, MD on 06/10/2022 Stage prefix: Initial diagnosis Total positive nodes: 0 Histologic grading system: 4 grade system Histologic grade (G): G3   Lloyd Huger, MD   10/28/2022 9:52 AM

## 2022-10-28 NOTE — Patient Instructions (Signed)
[facility]CER [facility] AT [place]  Discharge Instructions: Thank you for choosing [facility] to provide your oncology and hematology care.  If you have a lab appointment with the [place] [facility], please go directly to the [facility] and check in at the registration area.  Wear comfortable clothing and clothing appropriate for easy access to any Portacath or PICC line.   We strive to give you quality time with your provider. You may need to reschedule your appointment if you arrive late (15 or more minutes).  Arriving late affects you and other patients whose appointments are after yours.  Also, if you miss three or more appointments without notifying the office, you may be dismissed from the clinic at the provider's discretion.      For prescription refill requests, have your pharmacy contact our office and allow 72 hours for refills to be completed.    Today you received the following chemotherapy and/or immunotherapy agents- Oxaliplatin, leucovorin, 5FU      To help prevent nausea and vomiting after your treatment, we encourage you to take your nausea medication as directed.  BELOW ARE SYMPTOMS THAT SHOULD BE REPORTED IMMEDIATELY: *FEVER GREATER THAN 100.4 F (38 C) OR HIGHER *CHILLS OR SWEATING *NAUSEA AND VOMITING THAT IS NOT CONTROLLED WITH YOUR NAUSEA MEDICATION *UNUSUAL SHORTNESS OF BREATH *UNUSUAL BRUISING OR BLEEDING *URINARY PROBLEMS (pain or burning when urinating, or frequent urination) *BOWEL PROBLEMS (unusual diarrhea, constipation, pain near the anus) TENDERNESS IN MOUTH AND THROAT WITH OR WITHOUT PRESENCE OF ULCERS (sore throat, sores in mouth, or a toothache) UNUSUAL RASH, SWELLING OR PAIN  UNUSUAL VAGINAL DISCHARGE OR ITCHING   Items with * indicate a potential emergency and should be followed up as soon as possible or go to the Emergency Department if any problems should occur.  Please show the CHEMOTHERAPY ALERT CARD or IMMUNOTHERAPY ALERT  CARD at check-in to the Emergency Department and triage nurse.  Should you have questions after your visit or need to cancel or reschedule your appointment, please contact [facility] CANCER [place] AT [place]  [phone] and follow the prompts.  Office hours are 8:00 a.m. to 4:30 p.m. Monday - Friday. Please note that voicemails left after 4:00 p.m. may not be returned until the following business day.  We are closed weekends and major holidays. You have access to a nurse at all times for urgent questions. Please call the main number to the clinic [phone] and follow the prompts.  For any non-urgent questions, you may also contact your provider using MyChart. We now offer e-Visits for anyone 40 and older to request care online for non-urgent symptoms. For details visit [email or web address].   Also download the MyChart app! Go to the app store, search "MyChart", open the app, select [facility], and log in with your MyChart username and password.  Masks are optional in the cancer centers. If you would like for your care team to wear a mask while they are taking care of you, please let them know. For doctor visits, patients may have with them one support person who is at least 73 years old. At this time, visitors are not allowed in the infusion area.

## 2022-10-28 NOTE — Progress Notes (Signed)
Concern regarding weight gain; edema noted in ankles. Otherwise seems stable.

## 2022-10-30 ENCOUNTER — Inpatient Hospital Stay: Payer: Medicare Other

## 2022-10-30 DIAGNOSIS — C184 Malignant neoplasm of transverse colon: Secondary | ICD-10-CM

## 2022-10-30 DIAGNOSIS — Z5111 Encounter for antineoplastic chemotherapy: Secondary | ICD-10-CM | POA: Diagnosis not present

## 2022-10-30 MED ORDER — HEPARIN SOD (PORK) LOCK FLUSH 100 UNIT/ML IV SOLN
500.0000 [IU] | Freq: Once | INTRAVENOUS | Status: AC | PRN
  Administered 2022-10-30: 500 [IU]
  Filled 2022-10-30: qty 5

## 2022-10-30 MED ORDER — SODIUM CHLORIDE 0.9% FLUSH
10.0000 mL | INTRAVENOUS | Status: DC | PRN
  Administered 2022-10-30: 10 mL
  Filled 2022-10-30: qty 10

## 2022-11-10 MED FILL — Dexamethasone Sodium Phosphate Inj 100 MG/10ML: INTRAMUSCULAR | Qty: 1 | Status: AC

## 2022-11-11 ENCOUNTER — Inpatient Hospital Stay (HOSPITAL_BASED_OUTPATIENT_CLINIC_OR_DEPARTMENT_OTHER): Payer: Medicare Other | Admitting: Oncology

## 2022-11-11 ENCOUNTER — Ambulatory Visit: Payer: Medicare Other | Admitting: Oncology

## 2022-11-11 ENCOUNTER — Encounter: Payer: Self-pay | Admitting: Oncology

## 2022-11-11 ENCOUNTER — Ambulatory Visit: Payer: Medicare Other

## 2022-11-11 ENCOUNTER — Inpatient Hospital Stay: Payer: Medicare Other

## 2022-11-11 ENCOUNTER — Inpatient Hospital Stay: Payer: Medicare Other | Attending: Oncology

## 2022-11-11 ENCOUNTER — Other Ambulatory Visit: Payer: Medicare Other

## 2022-11-11 DIAGNOSIS — I110 Hypertensive heart disease with heart failure: Secondary | ICD-10-CM

## 2022-11-11 DIAGNOSIS — C184 Malignant neoplasm of transverse colon: Secondary | ICD-10-CM | POA: Diagnosis not present

## 2022-11-11 DIAGNOSIS — C189 Malignant neoplasm of colon, unspecified: Secondary | ICD-10-CM | POA: Insufficient documentation

## 2022-11-11 DIAGNOSIS — F17290 Nicotine dependence, other tobacco product, uncomplicated: Secondary | ICD-10-CM

## 2022-11-11 DIAGNOSIS — D649 Anemia, unspecified: Secondary | ICD-10-CM | POA: Diagnosis not present

## 2022-11-11 DIAGNOSIS — Z5111 Encounter for antineoplastic chemotherapy: Secondary | ICD-10-CM | POA: Diagnosis present

## 2022-11-11 DIAGNOSIS — I509 Heart failure, unspecified: Secondary | ICD-10-CM | POA: Diagnosis not present

## 2022-11-11 DIAGNOSIS — Z923 Personal history of irradiation: Secondary | ICD-10-CM | POA: Diagnosis not present

## 2022-11-11 DIAGNOSIS — C3432 Malignant neoplasm of lower lobe, left bronchus or lung: Secondary | ICD-10-CM

## 2022-11-11 DIAGNOSIS — E1165 Type 2 diabetes mellitus with hyperglycemia: Secondary | ICD-10-CM | POA: Diagnosis not present

## 2022-11-11 DIAGNOSIS — R739 Hyperglycemia, unspecified: Secondary | ICD-10-CM

## 2022-11-11 LAB — COMPREHENSIVE METABOLIC PANEL
ALT: 8 U/L (ref 0–44)
AST: 19 U/L (ref 15–41)
Albumin: 3.4 g/dL — ABNORMAL LOW (ref 3.5–5.0)
Alkaline Phosphatase: 50 U/L (ref 38–126)
Anion gap: 10 (ref 5–15)
BUN: 13 mg/dL (ref 8–23)
CO2: 25 mmol/L (ref 22–32)
Calcium: 8.7 mg/dL — ABNORMAL LOW (ref 8.9–10.3)
Chloride: 106 mmol/L (ref 98–111)
Creatinine, Ser: 0.76 mg/dL (ref 0.44–1.00)
GFR, Estimated: >60 mL/min (ref >60)
Glucose, Bld: 124 mg/dL — ABNORMAL HIGH (ref 70–99)
Potassium: 3.8 mmol/L (ref 3.5–5.1)
Sodium: 141 mmol/L (ref 135–145)
Total Bilirubin: 0.4 mg/dL (ref 0.3–1.2)
Total Protein: 6.8 g/dL (ref 6.5–8.1)

## 2022-11-11 LAB — CBC WITH DIFFERENTIAL/PLATELET
Abs Immature Granulocytes: 0.01 10*3/uL (ref 0.00–0.07)
Basophils Absolute: 0 10*3/uL (ref 0.0–0.1)
Basophils Relative: 0 %
Eosinophils Absolute: 0.1 10*3/uL (ref 0.0–0.5)
Eosinophils Relative: 1 %
HCT: 28.8 % — ABNORMAL LOW (ref 36.0–46.0)
Hemoglobin: 9.1 g/dL — ABNORMAL LOW (ref 12.0–15.0)
Immature Granulocytes: 0 %
Lymphocytes Relative: 18 %
Lymphs Abs: 0.9 10*3/uL (ref 0.7–4.0)
MCH: 28.4 pg (ref 26.0–34.0)
MCHC: 31.6 g/dL (ref 30.0–36.0)
MCV: 90 fL (ref 80.0–100.0)
Monocytes Absolute: 0.7 10*3/uL (ref 0.1–1.0)
Monocytes Relative: 14 %
Neutro Abs: 3.4 10*3/uL (ref 1.7–7.7)
Neutrophils Relative %: 67 %
Platelets: 158 10*3/uL (ref 150–400)
RBC: 3.2 MIL/uL — ABNORMAL LOW (ref 3.87–5.11)
RDW: 19.8 % — ABNORMAL HIGH (ref 11.5–15.5)
WBC: 5.1 10*3/uL (ref 4.0–10.5)
nRBC: 0 % (ref 0.0–0.2)

## 2022-11-11 LAB — MAGNESIUM: Magnesium: 1.5 mg/dL — ABNORMAL LOW (ref 1.7–2.4)

## 2022-11-11 MED ORDER — DEXTROSE 5 % IV SOLN
Freq: Once | INTRAVENOUS | Status: AC
  Filled 2022-11-11: qty 250

## 2022-11-11 MED ORDER — LEUCOVORIN CALCIUM INJECTION 350 MG
400.0000 mg/m2 | Freq: Once | INTRAVENOUS | Status: AC
  Administered 2022-11-11: 684 mg via INTRAVENOUS
  Filled 2022-11-11: qty 34.2

## 2022-11-11 MED ORDER — SODIUM CHLORIDE 0.9 % IV SOLN
2400.0000 mg/m2 | INTRAVENOUS | Status: DC
  Administered 2022-11-11: 4100 mg via INTRAVENOUS
  Filled 2022-11-11: qty 82

## 2022-11-11 MED ORDER — MAGNESIUM SULFATE 2 GM/50ML IV SOLN
2.0000 g | Freq: Once | INTRAVENOUS | Status: AC
  Administered 2022-11-11: 2 g via INTRAVENOUS
  Filled 2022-11-11: qty 50

## 2022-11-11 MED ORDER — OXALIPLATIN CHEMO INJECTION 100 MG/20ML
85.0000 mg/m2 | Freq: Once | INTRAVENOUS | Status: AC
  Administered 2022-11-11: 145 mg via INTRAVENOUS
  Filled 2022-11-11: qty 20

## 2022-11-11 MED ORDER — FLUOROURACIL CHEMO INJECTION 2.5 GM/50ML
400.0000 mg/m2 | Freq: Once | INTRAVENOUS | Status: AC
  Administered 2022-11-11: 700 mg via INTRAVENOUS
  Filled 2022-11-11: qty 14

## 2022-11-11 MED ORDER — SODIUM CHLORIDE 0.9 % IV SOLN
10.0000 mg | Freq: Once | INTRAVENOUS | Status: AC
  Administered 2022-11-11: 10 mg via INTRAVENOUS
  Filled 2022-11-11: qty 10

## 2022-11-11 MED ORDER — PALONOSETRON HCL INJECTION 0.25 MG/5ML
0.2500 mg | Freq: Once | INTRAVENOUS | Status: AC
  Administered 2022-11-11: 0.25 mg via INTRAVENOUS
  Filled 2022-11-11: qty 5

## 2022-11-11 NOTE — Progress Notes (Signed)
Pt is ambulating today without her wheelchair. Appetite is fair. Denies nausea. Bowels normal.

## 2022-11-11 NOTE — Progress Notes (Signed)
[facility]  [phone] [phone]) [phone]  ID: Jessica Stout OB: 1951-08-31  MR#: [id number]  CLE#:[id number]  Patient Care Team: Albina Billet, MD as PCP - General (Internal Medicine) Clent Jacks, RN as Oncology Nurse Navigator  I connected with Jessica Stout on 11/11/22 at  9:00 AM EST by video enabled telemedicine visit and verified that I am speaking with the correct person using two identifiers.   I discussed the limitations, risks, security and privacy concerns of performing an evaluation and management service by telemedicine and the availability of in-person appointments. I also discussed with the patient that there may be a patient responsible charge related to this service. The patient expressed understanding and agreed to proceed.   Other persons participating in the visit and their role in the encounter: Patient, MD.  Patient's location: Home. Provider's location: Clinic.  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  INTERVAL HISTORY: Patient returns to clinic today for further evaluation and consideration of cycle 9 of adjuvant FOLFOX.  She has several "bad days" after her treatments, but otherwise is tolerating them well.  She continues to have chronic weakness and fatigue. She has no neurologic complaints.  She denies any recent fevers.  She has no chest pain, cough, shortness of breath, or hemoptysis, although she does admit to using her oxygen at night.  She denies any nausea, vomiting, constipation, or diarrhea.  She has no melena or hematochezia.  Patient offers no further specific complaints today.  REVIEW OF SYSTEMS:   Review of Systems  Constitutional:  Positive for malaise/fatigue. Negative for fever and weight loss.  Respiratory: Negative.  Negative for cough, hemoptysis and shortness of breath.   Cardiovascular: Negative.  Negative for chest pain and leg swelling.  Gastrointestinal:  Negative.  Negative for abdominal pain, blood in stool, constipation, diarrhea, melena, nausea and vomiting.  Genitourinary: Negative.  Negative for dysuria.  Musculoskeletal: Negative.  Negative for back pain.  Skin: Negative.  Negative for rash.  Neurological:  Positive for weakness. Negative for dizziness, focal weakness and headaches.  Psychiatric/Behavioral: Negative.  The patient is not nervous/anxious.     As per HPI. Otherwise, a complete review of systems is negative.  PAST MEDICAL HISTORY: Past Medical History:  Diagnosis Date   Anemia    Anxiety    Asthma    Cancer ([place])    colon   CHF (congestive heart failure) ([facility])    COPD (chronic obstructive pulmonary disease) ([place])    Diabetes mellitus without complication ([place])    Hyperlipidemia    Hypertension    Stroke ([place])     PAST SURGICAL HISTORY: Past Surgical History:  Procedure Laterality Date   COLONOSCOPY WITH PROPOFOL N/A 02/03/2022   Procedure: COLONOSCOPY WITH PROPOFOL;  Surgeon: Lin Landsman, MD;  Location: [facility] ENDOSCOPY;  Service: Gastroenterology;  Laterality: N/A;   ESOPHAGOGASTRODUODENOSCOPY (EGD) WITH PROPOFOL N/A 02/03/2022   Procedure: ESOPHAGOGASTRODUODENOSCOPY (EGD) WITH PROPOFOL;  Surgeon: Lin Landsman, MD;  Location[facility] ENDOSCOPY;  Service: Gastroenterology;  Laterality: N/A;   GIVENS CAPSULE STUDY N/A 02/03/2022   Procedure: GIVENS CAPSULE STUDY;  Surgeon: Lin Landsman, MD;  Location: [facility] ENDOSCOPY;  Service: Gastroenterology;  Laterality: N/A;  possible Capsule Study   IR IMAGING GUIDED PORT INSERTION  06/18/2022    FAMILY HISTORY: History reviewed. No pertinent family history.  ADVANCED DIRECTIVES (Y/N):  N  HEALTH MAINTENANCE: Social History   Tobacco Use   Smoking status: Every Day  Types: E-cigarettes  Substance Use Topics   Alcohol use: Yes    Comment: rare     Colonoscopy:  PAP:  Bone density:  Lipid panel:  Allergies  Allergen Reactions   Codeine Rash     Happened in 1970s    Current Outpatient Medications  Medication Sig Dispense Refill   acetaminophen (TYLENOL) 325 MG tablet Take 650 mg by mouth every 6 (six) hours as needed.     albuterol (VENTOLIN HFA) 108 (90 Base) MCG/ACT inhaler Inhale into the lungs.     atorvastatin (LIPITOR) 40 MG tablet Take 40 mg by mouth daily.     fenofibrate 54 MG tablet Take 54 mg by mouth daily.     gabapentin (NEURONTIN) 600 MG tablet Take by mouth 2 (two) times daily.     insulin lispro (HUMALOG) 100 UNIT/ML injection Inject 1-10 Units into the skin 3 (three) times daily before meals. Per sliding scale     LANTUS SOLOSTAR 100 UNIT/ML Solostar Pen Inject 12 Units into the skin at bedtime.     lisinopril (ZESTRIL) 20 MG tablet Take 20 mg by mouth daily.     Magnesium Citrate 200 MG TABS Take 1 tablet by mouth in the morning and at bedtime.     metoprolol succinate (TOPROL-XL) 50 MG 24 hr tablet Take 50 mg by mouth daily. Take with or immediately following a meal.     ranolazine (RANEXA) 500 MG 12 hr tablet Take 500 mg by mouth 2 (two) times daily.     sitaGLIPtin-metformin (JANUMET) 50-1000 MG tablet Take 1 tablet by mouth 2 (two) times daily with a meal.     venlafaxine XR (EFFEXOR-XR) 75 MG 24 hr capsule Take 75 mg by mouth daily.     pantoprazole (PROTONIX) 40 MG tablet Take 1 tablet (40 mg total) by mouth daily. 30 tablet 5   No current facility-administered medications for this visit.   Facility-Administered Medications Ordered in Other Visits  Medication Dose Route Frequency Provider Last Rate Last Admin   sodium chloride flush (NS) 0.9 % injection 10 mL  10 mL Intracatheter PRN Lloyd Huger, MD   10 mL at 10/09/22 1353    OBJECTIVE: Vitals:   11/11/22 0856  BP: (!) 143/76  Pulse: 84  Resp: 18  Temp: (!) 96.8 F (36 C)  SpO2: 99%      Body mass index is 23.77 kg/m.    ECOG FS:1 - Symptomatic but completely ambulatory  General: Well-developed, well-nourished, no acute  distress. Eyes: Pink conjunctiva, anicteric sclera. HEENT: Normocephalic, moist mucous membranes. Lungs: No audible wheezing or coughing. Heart: Regular rate and rhythm. Abdomen: Soft, nontender, no obvious distention. Musculoskeletal: No edema, cyanosis, or clubbing. Neuro: Alert, answering all questions appropriately. Cranial nerves grossly intact. Skin: No rashes or petechiae noted. Psych: Normal affect.  LAB RESULTS:  Lab Results  Component Value Date   NA 141 11/11/2022   K 3.8 11/11/2022   CL 106 11/11/2022   CO2 25 11/11/2022   GLUCOSE 124 (H) 11/11/2022   BUN 13 11/11/2022   CREATININE 0.76 11/11/2022   CALCIUM 8.7 (L) 11/11/2022   PROT 6.8 11/11/2022   ALBUMIN 3.4 (L) 11/11/2022   AST 19 11/11/2022   ALT 8 11/11/2022   ALKPHOS 50 11/11/2022   BILITOT 0.4 11/11/2022   GFRNONAA >60 11/11/2022    Lab Results  Component Value Date   WBC 5.1 11/11/2022   NEUTROABS 3.4 11/11/2022   HGB 9.1 (L) 11/11/2022   HCT 28.8 (L)  11/11/2022   MCV 90.0 11/11/2022   PLT 158 11/11/2022     STUDIES: No results found.  ASSESSMENT: Stage IIIc colon cancer.  PLAN:    Stage IIIc colon cancer: Patient underwent complete surgical resection at [facility] on May 23, 2022.  Large colon mass was noted to partially invade stomach as well as surrounding mesentery.  PET scan did not reveal any metastatic disease other than a positive lung nodule which was confirmed to be a second primary.  Patient's preop CEA was 171.  Patient will require adjuvant chemotherapy using FOLFOX every 2 weeks x 12.  Patient has now had port placement.  Proceed with cycle 9 of treatment today.  Return to clinic in 2 days for pump removal and then in 2 weeks for further evaluation and consideration of cycle 10. 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  Patient completed XRT in May 2023.  CT scan results from September 01, 2022 reviewed  independently with partial regression of nodule indicating treatment effect.  Repeat CT scan of chest in approximately April 2024. Pain: Resolved. Anemia: Chronic and unchanged.  Patient's hemoglobin is 9.1.  Thrombocytopenia: Resolved. Hyperglycemia: Patient has improved blood glucose control.   Hypokalemia: Resolved.  Continue oral potassium supplementation.   Neutropenia: Resolved.  May need to consider Udenyca with subsequent treatments. Shortness of breath/peripheral edema: Patient does not complain of this today.   Hypomagnesia: Improving.  Continue 2 g IV magnesium with treatment.   Patient expressed understanding and was in agreement with this plan. She also understands that She can call clinic at any time with any questions, concerns, or complaints.    Cancer Staging  Colon cancer [facility]) Staging form: Colon and Rectum, AJCC 8th Edition - Pathologic stage from 06/10/2022: Stage IIIC (pT4b, pN1c, cM0) - Signed by Lloyd Huger, MD on 06/10/2022 Stage prefix: Initial diagnosis Total positive nodes: 0 Histologic grading system: 4 grade system Histologic grade (G): G3   Lloyd Huger, MD   11/11/2022 9:17 AM

## 2022-11-11 NOTE — Patient Instructions (Signed)
[facility] CANCER CTR AT [place]  Discharge Instructions: Thank you for choosing [facility] to provide your oncology and hematology care.  If you have a lab appointment with the [facility], please go directly to the [facility] and check in at the registration area.  Wear comfortable clothing and clothing appropriate for easy access to any Portacath or PICC line.   We strive to give you quality time with your provider. You may need to reschedule your appointment if you arrive late (15 or more minutes).  Arriving late affects you and other patients whose appointments are after yours.  Also, if you miss three or more appointments without notifying the office, you may be dismissed from the clinic at the provider's discretion.      For prescription refill requests, have your pharmacy contact our office and allow 72 hours for refills to be completed.    Today you received the following chemotherapy and/or immunotherapy agents OXILIPLATIN, LEUCOVORIN, 5FU, MAGNESIUM  Oxaliplatin Injection What is this medication? OXALIPLATIN (ox AL i PLA tin) treats some types of cancer. It works by slowing down the growth of cancer cells. This medicine may be used for other purposes; ask your health care provider or pharmacist if you have questions. COMMON BRAND NAME(S): Eloxatin What should I tell my care team before I take this medication? They need to know if you have any of these conditions: Heart disease History of irregular heartbeat or rhythm Liver disease Low blood cell levels (white cells, red cells, and platelets) Lung or breathing disease, such as asthma Take medications that treat or prevent blood clots Tingling of the fingers, toes, or other nerve disorder An unusual or allergic reaction to oxaliplatin, other medications, foods, dyes, or preservatives If you or your partner are pregnant or trying to get pregnant Breast-feeding How should I use this  medication? This medication is injected into a vein. It is given by your care team in a hospital or clinic setting. Talk to your care team about the use of this medication in children. Special care may be needed. Overdosage: If you think you have taken too much of this medicine contact a poison control center or emergency room at once. NOTE: This medicine is only for you. Do not share this medicine with others. What if I miss a dose? Keep appointments for follow-up doses. It is important not to miss a dose. Call your care team if you are unable to keep an appointment. What may interact with this medication? Do not take this medication with any of the following: Cisapride Dronedarone Pimozide Thioridazine This medication may also interact with the following: Aspirin and aspirin-like medications Certain medications that treat or prevent blood clots, such as warfarin, apixaban, dabigatran, and rivaroxaban Cisplatin Cyclosporine Diuretics Medications for infection, such as acyclovir, adefovir, amphotericin B, bacitracin, cidofovir, foscarnet, ganciclovir, gentamicin, pentamidine, vancomycin NSAIDs, medications for pain and inflammation, such as ibuprofen or naproxen Other medications that cause heart rhythm changes Pamidronate Zoledronic acid This list may not describe all possible interactions. Give your health care provider a list of all the medicines, herbs, non-prescription drugs, or dietary supplements you use. Also tell them if you smoke, drink alcohol, or use illegal drugs. Some items may interact with your medicine. What should I watch for while using this medication? Your condition will be monitored carefully while you are receiving this medication. You may need blood work while taking this medication. This medication may make you feel generally unwell. This is not uncommon  as chemotherapy can affect healthy cells as well as cancer cells. Report any side effects. Continue your course  of treatment even though you feel ill unless your care team tells you to stop. This medication may increase your risk of getting an infection. Call your care team for advice if you get a fever, chills, sore throat, or other symptoms of a cold or flu. Do not treat yourself. Try to avoid being around people who are sick. Avoid taking medications that contain aspirin, acetaminophen, ibuprofen, naproxen, or ketoprofen unless instructed by your care team. These medications may hide a fever. Be careful brushing or flossing your teeth or using a toothpick because you may get an infection or bleed more easily. If you have any dental work done, tell your dentist you are receiving this medication. This medication can make you more sensitive to cold. Do not drink cold drinks or use ice. Cover exposed skin before coming in contact with cold temperatures or cold objects. When out in cold weather wear warm clothing and cover your mouth and nose to warm the air that goes into your lungs. Tell your care team if you get sensitive to the cold. Talk to your care team if you or your partner are pregnant or think either of you might be pregnant. This medication can cause serious birth defects if taken during pregnancy and for 9 months after the last dose. A negative pregnancy test is required before starting this medication. A reliable form of contraception is recommended while taking this medication and for 9 months after the last dose. Talk to your care team about effective forms of contraception. Do not father a child while taking this medication and for 6 months after the last dose. Use a condom while having sex during this time period. Do not breastfeed while taking this medication and for 3 months after the last dose. This medication may cause infertility. Talk to your care team if you are concerned about your fertility. What side effects may I notice from receiving this medication? Side effects that you should report to  your care team as soon as possible: Allergic reactions--skin rash, itching, hives, swelling of the face, lips, tongue, or throat Bleeding--bloody or black, tar-like stools, vomiting blood or brown material that looks like coffee grounds, red or dark brown urine, small red or purple spots on skin, unusual bruising or bleeding Dry cough, shortness of breath or trouble breathing Heart rhythm changes--fast or irregular heartbeat, dizziness, feeling faint or lightheaded, chest pain, trouble breathing Infection--fever, chills, cough, sore throat, wounds that don't heal, pain or trouble when passing urine, general feeling of discomfort or being unwell Liver injury--right upper belly pain, loss of appetite, nausea, light-colored stool, dark yellow or brown urine, yellowing skin or eyes, unusual weakness or fatigue Low red blood cell level--unusual weakness or fatigue, dizziness, headache, trouble breathing Muscle injury--unusual weakness or fatigue, muscle pain, dark yellow or brown urine, decrease in amount of urine Pain, tingling, or numbness in the hands or feet Sudden and severe headache, confusion, change in vision, seizures, which may be signs of posterior reversible encephalopathy syndrome (PRES) Unusual bruising or bleeding Side effects that usually do not require medical attention (report to your care team if they continue or are bothersome): Diarrhea Nausea Pain, redness, or swelling with sores inside the mouth or throat Unusual weakness or fatigue Vomiting This list may not describe all possible side effects. Call your doctor for medical advice about side effects. You may report side effects to  FDA at 1-800-FDA-1088. Where should I keep my medication? This medication is given in a hospital or clinic. It will not be stored at home. NOTE: This sheet is a summary. It may not cover all possible information. If you have questions about this medicine, talk to your doctor, pharmacist, or health care  provider.  2023 Elsevier/Gold Standard (2008-01-08 00:00:00)       To help prevent nausea and vomiting after your treatment, we encourage you to take your nausea medication as directed.  BELOW ARE SYMPTOMS THAT SHOULD BE REPORTED IMMEDIATELY: *FEVER GREATER THAN 100.4 F (38 C) OR HIGHER *CHILLS OR SWEATING *NAUSEA AND VOMITING THAT IS NOT CONTROLLED WITH YOUR NAUSEA MEDICATION *UNUSUAL SHORTNESS OF BREATH *UNUSUAL BRUISING OR BLEEDING *URINARY PROBLEMS (pain or burning when urinating, or frequent urination) *BOWEL PROBLEMS (unusual diarrhea, constipation, pain near the anus) TENDERNESS IN MOUTH AND THROAT WITH OR WITHOUT PRESENCE OF ULCERS (sore throat, sores in mouth, or a toothache) UNUSUAL RASH, SWELLING OR PAIN  UNUSUAL VAGINAL DISCHARGE OR ITCHING   Items with * indicate a potential emergency and should be followed up as soon as possible or go to the Emergency Department if any problems should occur.  Please show the CHEMOTHERAPY ALERT CARD or IMMUNOTHERAPY ALERT CARD at check-in to the Emergency Department and triage nurse.  Should you have questions after your visit or need to cancel or reschedule your appointment, please contact [facility] CANCER [place] AT [place]  [phone] and follow the prompts.  Office hours are 8:00 a.m. to 4:30 p.m. Monday - Friday. Please note that voicemails left after 4:00 p.m. may not be returned until the following business day.  We are closed weekends and major holidays. You have access to a nurse at all times for urgent questions. Please call the main number to the clinic [phone] and follow the prompts.  For any non-urgent questions, you may also contact your provider using MyChart. We now offer e-Visits for anyone 103 and older to request care online for non-urgent symptoms. For details visit [email or web address].   Also download the MyChart app! Go to the app store, search "MyChart", open the app, select [facility], and log in  with your MyChart username and password.  Masks are optional in the cancer centers. If you would like for your care team to wear a mask while they are taking care of you, please let them know. For doctor visits, patients may have with them one support person who is at least 73 years old. At this time, visitors are not allowed in the infusion area.  Leucovorin Injection What is this medication? LEUCOVORIN (loo koe VOR in) prevents side effects from certain medications, such as methotrexate. It works by increasing folate levels. This helps protect healthy cells in your body. It may also be used to treat anemia caused by low levels of folate. It can also be used with fluorouracil, a type of chemotherapy, to treat colorectal cancer. It works by increasing the effects of fluorouracil in the body. This medicine may be used for other purposes; ask your health care provider or pharmacist if you have questions. What should I tell my care team before I take this medication? They need to know if you have any of these conditions: Anemia from low levels of vitamin B12 in the blood An unusual or allergic reaction to leucovorin, folic acid, other medications, foods, dyes, or preservatives Pregnant or trying to get pregnant Breastfeeding How should I use this medication? This medication is injected into a  vein or a muscle. It is given by your care team in a hospital or clinic setting. Talk to your care team about the use of this medication in children. Special care may be needed. Overdosage: If you think you have taken too much of this medicine contact a poison control center or emergency room at once. NOTE: This medicine is only for you. Do not share this medicine with others. What if I miss a dose? Keep appointments for follow-up doses. It is important not to miss your dose. Call your care team if you are unable to keep an appointment. What may interact with this  medication? Capecitabine Fluorouracil Phenobarbital Phenytoin Primidone Trimethoprim;sulfamethoxazole This list may not describe all possible interactions. Give your health care provider a list of all the medicines, herbs, non-prescription drugs, or dietary supplements you use. Also tell them if you smoke, drink alcohol, or use illegal drugs. Some items may interact with your medicine. What should I watch for while using this medication? Your condition will be monitored carefully while you are receiving this medication. This medication may increase the side effects of 5-fluorouracil. Tell your care team if you have diarrhea or mouth sores that do not get better or that get worse. What side effects may I notice from receiving this medication? Side effects that you should report to your care team as soon as possible: Allergic reactions--skin rash, itching, hives, swelling of the face, lips, tongue, or throat This list may not describe all possible side effects. Call your doctor for medical advice about side effects. You may report side effects to FDA at 1-800-FDA-1088. Where should I keep my medication? This medication is given in a hospital or clinic. It will not be stored at home. NOTE: This sheet is a summary. It may not cover all possible information. If you have questions about this medicine, talk to your doctor, pharmacist, or health care provider.  2023 Elsevier/Gold Standard (2022-04-22 00:00:00)   Fluorouracil Injection What is this medication? FLUOROURACIL (flure oh YOOR a sil) treats some types of cancer. It works by slowing down the growth of cancer cells. This medicine may be used for other purposes; ask your health care provider or pharmacist if you have questions. COMMON BRAND NAME(S): Adrucil What should I tell my care team before I take this medication? They need to know if you have any of these conditions: Blood disorders Dihydropyrimidine dehydrogenase (DPD)  deficiency Infection, such as chickenpox, cold sores, herpes Kidney disease Liver disease Poor nutrition Recent or ongoing radiation therapy An unusual or allergic reaction to fluorouracil, other medications, foods, dyes, or preservatives If you or your partner are pregnant or trying to get pregnant Breast-feeding How should I use this medication? This medication is injected into a vein. It is administered by your care team in a hospital or clinic setting. Talk to your care team about the use of this medication in children. Special care may be needed. Overdosage: If you think you have taken too much of this medicine contact a poison control center or emergency room at once. NOTE: This medicine is only for you. Do not share this medicine with others. What if I miss a dose? Keep appointments for follow-up doses. It is important not to miss your dose. Call your care team if you are unable to keep an appointment. What may interact with this medication? Do not take this medication with any of the following: Live virus vaccines This medication may also interact with the following: Medications that treat or  prevent blood clots, such as warfarin, enoxaparin, dalteparin This list may not describe all possible interactions. Give your health care provider a list of all the medicines, herbs, non-prescription drugs, or dietary supplements you use. Also tell them if you smoke, drink alcohol, or use illegal drugs. Some items may interact with your medicine. What should I watch for while using this medication? Your condition will be monitored carefully while you are receiving this medication. This medication may make you feel generally unwell. This is not uncommon as chemotherapy can affect healthy cells as well as cancer cells. Report any side effects. Continue your course of treatment even though you feel ill unless your care team tells you to stop. In some cases, you may be given additional medications  to help with side effects. Follow all directions for their use. This medication may increase your risk of getting an infection. Call your care team for advice if you get a fever, chills, sore throat, or other symptoms of a cold or flu. Do not treat yourself. Try to avoid being around people who are sick. This medication may increase your risk to bruise or bleed. Call your care team if you notice any unusual bleeding. Be careful brushing or flossing your teeth or using a toothpick because you may get an infection or bleed more easily. If you have any dental work done, tell your dentist you are receiving this medication. Avoid taking medications that contain aspirin, acetaminophen, ibuprofen, naproxen, or ketoprofen unless instructed by your care team. These medications may hide a fever. Do not treat diarrhea with over the counter products. Contact your care team if you have diarrhea that lasts more than 2 days or if it is severe and watery. This medication can make you more sensitive to the sun. Keep out of the sun. If you cannot avoid being in the sun, wear protective clothing and sunscreen. Do not use sun lamps, tanning beds, or tanning booths. Talk to your care team if you or your partner wish to become pregnant or think you might be pregnant. This medication can cause serious birth defects if taken during pregnancy and for 3 months after the last dose. A reliable form of contraception is recommended while taking this medication and for 3 months after the last dose. Talk to your care team about effective forms of contraception. Do not father a child while taking this medication and for 3 months after the last dose. Use a condom while having sex during this time period. Do not breastfeed while taking this medication. This medication may cause infertility. Talk to your care team if you are concerned about your fertility. What side effects may I notice from receiving this medication? Side effects that you  should report to your care team as soon as possible: Allergic reactions--skin rash, itching, hives, swelling of the face, lips, tongue, or throat Heart attack--pain or tightness in the chest, shoulders, arms, or jaw, nausea, shortness of breath, cold or clammy skin, feeling faint or lightheaded Heart failure--shortness of breath, swelling of the ankles, feet, or hands, sudden weight gain, unusual weakness or fatigue Heart rhythm changes--fast or irregular heartbeat, dizziness, feeling faint or lightheaded, chest pain, trouble breathing High ammonia level--unusual weakness or fatigue, confusion, loss of appetite, nausea, vomiting, seizures Infection--fever, chills, cough, sore throat, wounds that don't heal, pain or trouble when passing urine, general feeling of discomfort or being unwell Low red blood cell level--unusual weakness or fatigue, dizziness, headache, trouble breathing Pain, tingling, or numbness in the hands  or feet, muscle weakness, change in vision, confusion or trouble speaking, loss of balance or coordination, trouble walking, seizures Redness, swelling, and blistering of the skin over hands and feet Severe or prolonged diarrhea Unusual bruising or bleeding Side effects that usually do not require medical attention (report to your care team if they continue or are bothersome): Dry skin Headache Increased tears Nausea Pain, redness, or swelling with sores inside the mouth or throat Sensitivity to light Vomiting This list may not describe all possible side effects. Call your doctor for medical advice about side effects. You may report side effects to FDA at 1-800-FDA-1088. Where should I keep my medication? This medication is given in a hospital or clinic. It will not be stored at home. NOTE: This sheet is a summary. It may not cover all possible information. If you have questions about this medicine, talk to your doctor, pharmacist, or health care provider.  2023 Elsevier/Gold  Standard (2022-03-18 00:00:00)

## 2022-11-13 ENCOUNTER — Inpatient Hospital Stay: Payer: Medicare Other

## 2022-11-13 DIAGNOSIS — C184 Malignant neoplasm of transverse colon: Secondary | ICD-10-CM

## 2022-11-13 DIAGNOSIS — Z5111 Encounter for antineoplastic chemotherapy: Secondary | ICD-10-CM | POA: Diagnosis not present

## 2022-11-13 MED ORDER — HEPARIN SOD (PORK) LOCK FLUSH 100 UNIT/ML IV SOLN
500.0000 [IU] | Freq: Once | INTRAVENOUS | Status: AC | PRN
  Administered 2022-11-13: 500 [IU]
  Filled 2022-11-13: qty 5

## 2022-11-13 MED ORDER — SODIUM CHLORIDE 0.9% FLUSH
10.0000 mL | INTRAVENOUS | Status: DC | PRN
  Administered 2022-11-13: 10 mL
  Filled 2022-11-13: qty 10

## 2022-11-25 ENCOUNTER — Ambulatory Visit: Payer: Medicare Other

## 2022-11-25 ENCOUNTER — Other Ambulatory Visit: Payer: Medicare Other

## 2022-11-25 ENCOUNTER — Inpatient Hospital Stay: Payer: Medicare Other

## 2022-11-25 ENCOUNTER — Inpatient Hospital Stay (HOSPITAL_BASED_OUTPATIENT_CLINIC_OR_DEPARTMENT_OTHER): Payer: Medicare Other | Admitting: Oncology

## 2022-11-25 ENCOUNTER — Encounter: Payer: Self-pay | Admitting: Oncology

## 2022-11-25 ENCOUNTER — Ambulatory Visit: Payer: Medicare Other | Admitting: Oncology

## 2022-11-25 DIAGNOSIS — C184 Malignant neoplasm of transverse colon: Secondary | ICD-10-CM

## 2022-11-25 DIAGNOSIS — E876 Hypokalemia: Secondary | ICD-10-CM

## 2022-11-25 DIAGNOSIS — Z5111 Encounter for antineoplastic chemotherapy: Secondary | ICD-10-CM | POA: Diagnosis not present

## 2022-11-25 LAB — CBC WITH DIFFERENTIAL/PLATELET
Abs Immature Granulocytes: 0.01 10*3/uL (ref 0.00–0.07)
Basophils Absolute: 0 10*3/uL (ref 0.0–0.1)
Basophils Relative: 0 %
Eosinophils Absolute: 0 10*3/uL (ref 0.0–0.5)
Eosinophils Relative: 1 %
HCT: 26.7 % — ABNORMAL LOW (ref 36.0–46.0)
Hemoglobin: 8.6 g/dL — ABNORMAL LOW (ref 12.0–15.0)
Immature Granulocytes: 0 %
Lymphocytes Relative: 22 %
Lymphs Abs: 1 10*3/uL (ref 0.7–4.0)
MCH: 28.4 pg (ref 26.0–34.0)
MCHC: 32.2 g/dL (ref 30.0–36.0)
MCV: 88.1 fL (ref 80.0–100.0)
Monocytes Absolute: 0.7 10*3/uL (ref 0.1–1.0)
Monocytes Relative: 15 %
Neutro Abs: 2.8 10*3/uL (ref 1.7–7.7)
Neutrophils Relative %: 62 %
Platelets: 119 10*3/uL — ABNORMAL LOW (ref 150–400)
RBC: 3.03 MIL/uL — ABNORMAL LOW (ref 3.87–5.11)
RDW: 19.9 % — ABNORMAL HIGH (ref 11.5–15.5)
WBC: 4.5 10*3/uL (ref 4.0–10.5)
nRBC: 0 % (ref 0.0–0.2)

## 2022-11-25 LAB — COMPREHENSIVE METABOLIC PANEL
ALT: 9 U/L (ref 0–44)
AST: 19 U/L (ref 15–41)
Albumin: 3.5 g/dL (ref 3.5–5.0)
Alkaline Phosphatase: 43 U/L (ref 38–126)
Anion gap: 6 (ref 5–15)
BUN: 14 mg/dL (ref 8–23)
CO2: 28 mmol/L (ref 22–32)
Calcium: 8.4 mg/dL — ABNORMAL LOW (ref 8.9–10.3)
Chloride: 107 mmol/L (ref 98–111)
Creatinine, Ser: 0.73 mg/dL (ref 0.44–1.00)
GFR, Estimated: >60 mL/min (ref >60)
Glucose, Bld: 130 mg/dL — ABNORMAL HIGH (ref 70–99)
Potassium: 3.2 mmol/L — ABNORMAL LOW (ref 3.5–5.1)
Sodium: 141 mmol/L (ref 135–145)
Total Bilirubin: 0.4 mg/dL (ref 0.3–1.2)
Total Protein: 6.7 g/dL (ref 6.5–8.1)

## 2022-11-25 LAB — MAGNESIUM: Magnesium: 1.7 mg/dL (ref 1.7–2.4)

## 2022-11-25 MED ORDER — LEUCOVORIN CALCIUM INJECTION 350 MG
400.0000 mg/m2 | Freq: Once | INTRAVENOUS | Status: AC
  Administered 2022-11-25: 684 mg via INTRAVENOUS
  Filled 2022-11-25: qty 34.2

## 2022-11-25 MED ORDER — DEXTROSE 5 % IV SOLN
Freq: Once | INTRAVENOUS | Status: AC
  Filled 2022-11-25: qty 250

## 2022-11-25 MED ORDER — SODIUM CHLORIDE 0.9 % IV SOLN
10.0000 mg | Freq: Once | INTRAVENOUS | Status: AC
  Administered 2022-11-25: 10 mg via INTRAVENOUS
  Filled 2022-11-25: qty 1

## 2022-11-25 MED ORDER — POTASSIUM CHLORIDE 20 MEQ/100ML IV SOLN
20.0000 meq | Freq: Once | INTRAVENOUS | Status: AC
  Administered 2022-11-25: 20 meq via INTRAVENOUS

## 2022-11-25 MED ORDER — FLUOROURACIL CHEMO INJECTION 2.5 GM/50ML
400.0000 mg/m2 | Freq: Once | INTRAVENOUS | Status: AC
  Administered 2022-11-25: 700 mg via INTRAVENOUS
  Filled 2022-11-25: qty 14

## 2022-11-25 MED ORDER — SODIUM CHLORIDE 0.9 % IV SOLN
2400.0000 mg/m2 | INTRAVENOUS | Status: DC
  Administered 2022-11-25: 4100 mg via INTRAVENOUS
  Filled 2022-11-25: qty 82

## 2022-11-25 MED ORDER — PALONOSETRON HCL INJECTION 0.25 MG/5ML
0.2500 mg | Freq: Once | INTRAVENOUS | Status: AC
  Administered 2022-11-25: 0.25 mg via INTRAVENOUS
  Filled 2022-11-25: qty 5

## 2022-11-25 MED ORDER — OXALIPLATIN CHEMO INJECTION 100 MG/20ML
85.0000 mg/m2 | Freq: Once | INTRAVENOUS | Status: AC
  Administered 2022-11-25: 145 mg via INTRAVENOUS
  Filled 2022-11-25: qty 10

## 2022-11-25 NOTE — Patient Instructions (Signed)
[facility] CANCER CTR AT [place]  Discharge Instructions: Thank you for choosing [facility] to provide your oncology and hematology care.  If you have a lab appointment with the [facility], please go directly to the [facility] and check in at the registration area.  Wear comfortable clothing and clothing appropriate for easy access to any Portacath or PICC line.   We strive to give you quality time with your provider. You may need to reschedule your appointment if you arrive late (15 or more minutes).  Arriving late affects you and other patients whose appointments are after yours.  Also, if you miss three or more appointments without notifying the office, you may be dismissed from the clinic at the provider's discretion.      For prescription refill requests, have your pharmacy contact our office and allow 72 hours for refills to be completed.    Today you received the following chemotherapy and/or immunotherapy agents: Oxaliplatin, Leucovorin, 5FU      To help prevent nausea and vomiting after your treatment, we encourage you to take your nausea medication as directed.  BELOW ARE SYMPTOMS THAT SHOULD BE REPORTED IMMEDIATELY: *FEVER GREATER THAN 100.4 F (38 C) OR HIGHER *CHILLS OR SWEATING *NAUSEA AND VOMITING THAT IS NOT CONTROLLED WITH YOUR NAUSEA MEDICATION *UNUSUAL SHORTNESS OF BREATH *UNUSUAL BRUISING OR BLEEDING *URINARY PROBLEMS (pain or burning when urinating, or frequent urination) *BOWEL PROBLEMS (unusual diarrhea, constipation, pain near the anus) TENDERNESS IN MOUTH AND THROAT WITH OR WITHOUT PRESENCE OF ULCERS (sore throat, sores in mouth, or a toothache) UNUSUAL RASH, SWELLING OR PAIN  UNUSUAL VAGINAL DISCHARGE OR ITCHING   Items with * indicate a potential emergency and should be followed up as soon as possible or go to the Emergency Department if any problems should occur.  Please show the CHEMOTHERAPY ALERT CARD or IMMUNOTHERAPY ALERT  CARD at check-in to the Emergency Department and triage nurse.  Should you have questions after your visit or need to cancel or reschedule your appointment, please contact [facility] CANCER [place] AT [place]  [phone] and follow the prompts.  Office hours are 8:00 a.m. to 4:30 p.m. Monday - Friday. Please note that voicemails left after 4:00 p.m. may not be returned until the following business day.  We are closed weekends and major holidays. You have access to a nurse at all times for urgent questions. Please call the main number to the clinic [phone] and follow the prompts.  For any non-urgent questions, you may also contact your provider using MyChart. We now offer e-Visits for anyone 73 and older to request care online for non-urgent symptoms. For details visit [email or web address].   Also download the MyChart app! Go to the app store, search "MyChart", open the app, select [facility], and log in with your MyChart username and password.  Masks are optional in the cancer centers. If you would like for your care team to wear a mask while they are taking care of you, please let them know. For doctor visits, patients may have with them one support person who is at least 73 years old. At this time, visitors are not allowed in the infusion area.

## 2022-11-25 NOTE — Progress Notes (Signed)
Abdominal swelling x3 days. No pain. Not urinating as much as she usually does. Redness in right eye she would like looked at.

## 2022-11-25 NOTE — Progress Notes (Signed)
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  INTERVAL HISTORY: Patient returns to clinic today for further evaluation and consideration of cycle 10 of adjuvant FOLFOX.  She has some abdominal bloating, but otherwise feels well.  She does not complain of weakness or fatigue today. She has no neurologic complaints.  She denies any recent fevers.  She has no chest pain, cough, shortness of breath, or hemoptysis, although she does admit to occasionally using her oxygen at night.  She denies any nausea, vomiting, constipation, or diarrhea.  She has no melena or hematochezia.  She has no urinary complaints.  Patient offers no further specific complaints today.  REVIEW OF SYSTEMS:   Review of Systems  Constitutional: Negative.  Negative for fever, malaise/fatigue and weight loss.  Respiratory: Negative.  Negative for cough, hemoptysis and shortness of breath.   Cardiovascular: Negative.  Negative for chest pain and leg swelling.  Gastrointestinal: Negative.  Negative for abdominal pain, blood in stool, constipation, diarrhea, melena, nausea and vomiting.  Genitourinary: Negative.  Negative for dysuria.  Musculoskeletal: Negative.  Negative for back pain.  Skin: Negative.  Negative for rash.  Neurological: Negative.  Negative for dizziness, focal weakness, weakness and headaches.  Psychiatric/Behavioral: Negative.  The patient is not nervous/anxious.     As per HPI. Otherwise, a complete review of systems is negative.  PAST MEDICAL HISTORY: Past Medical History:  Diagnosis Date   Anemia    Anxiety    Asthma    Cancer ([place])    colon   CHF (congestive heart failure) (HCC)    COPD  (chronic obstructive pulmonary disease) ([place])    Diabetes mellitus without complication ([place])    Hyperlipidemia    Hypertension    Stroke ([place])     PAST SURGICAL HISTORY: Past Surgical History:  Procedure Laterality Date   COLONOSCOPY WITH PROPOFOL N/A 02/03/2022   Procedure: COLONOSCOPY WITH PROPOFOL;  Surgeon: Lin Landsman, MD;  Location: [facility] ENDOSCOPY;  Service: Gastroenterology;  Laterality: N/A;   ESOPHAGOGASTRODUODENOSCOPY (EGD) WITH PROPOFOL N/A 02/03/2022   Procedure: ESOPHAGOGASTRODUODENOSCOPY (EGD) WITH PROPOFOL;  Surgeon: Lin Landsman, MD;  Location: [facility] ENDOSCOPY;  Service: Gastroenterology;  Laterality: N/A;   GIVENS CAPSULE STUDY N/A 02/03/2022   Procedure: GIVENS CAPSULE STUDY;  Surgeon: Lin Landsman, MD;  Location: [facility] ENDOSCOPY;  Service: Gastroenterology;  Laterality: N/A;  possible Capsule Study   IR IMAGING GUIDED PORT INSERTION  06/18/2022    FAMILY HISTORY: History reviewed. No pertinent family history.  ADVANCED DIRECTIVES (Y/N):  N  HEALTH MAINTENANCE: Social History   Tobacco Use   Smoking status: Every Day    Types: E-cigarettes  Substance Use Topics   Alcohol use: Yes    Comment: rare     Colonoscopy:  PAP:  Bone density:  Lipid panel:  Allergies  Allergen Reactions   Codeine Rash    Happened in 1970s    Current Outpatient Medications  Medication Sig Dispense Refill   acetaminophen (TYLENOL) 325 MG tablet Take 650 mg by mouth every 6 (six) hours as needed.     albuterol (VENTOLIN HFA) 108 (90 Base) MCG/ACT inhaler Inhale into the lungs.     atorvastatin (LIPITOR) 40 MG tablet Take 40 mg by  mouth daily.     fenofibrate 54 MG tablet Take 54 mg by mouth daily.     gabapentin (NEURONTIN) 600 MG tablet Take by mouth 2 (two) times daily.     insulin lispro (HUMALOG) 100 UNIT/ML injection Inject 1-10 Units into the skin 3 (three) times daily before meals. Per sliding scale     LANTUS SOLOSTAR 100 UNIT/ML Solostar Pen Inject 12  Units into the skin at bedtime.     lisinopril (ZESTRIL) 20 MG tablet Take 20 mg by mouth daily.     Magnesium Citrate 200 MG TABS Take 1 tablet by mouth in the morning and at bedtime.     metoprolol succinate (TOPROL-XL) 50 MG 24 hr tablet Take 50 mg by mouth daily. Take with or immediately following a meal.     ranolazine (RANEXA) 500 MG 12 hr tablet Take 500 mg by mouth 2 (two) times daily.     sitaGLIPtin-metformin (JANUMET) 50-1000 MG tablet Take 1 tablet by mouth 2 (two) times daily with a meal.     venlafaxine XR (EFFEXOR-XR) 75 MG 24 hr capsule Take 75 mg by mouth daily.     pantoprazole (PROTONIX) 40 MG tablet Take 1 tablet (40 mg total) by mouth daily. 30 tablet 5   No current facility-administered medications for this visit.   Facility-Administered Medications Ordered in Other Visits  Medication Dose Route Frequency Provider Last Rate Last Admin   fluorouracil (ADRUCIL) 4,100 mg in sodium chloride 0.9 % 68 mL chemo infusion  2,400 mg/m2 (Treatment Plan Recorded) Intravenous 1 day or 1 dose Grayland Ormond, Kathlene November, MD       fluorouracil (ADRUCIL) chemo injection 700 mg  400 mg/m2 (Treatment Plan Recorded) Intravenous Once Lloyd Huger, MD       leucovorin 684 mg in dextrose 5 % 250 mL infusion  400 mg/m2 (Treatment Plan Recorded) Intravenous Once Lloyd Huger, MD 142 mL/hr at 11/25/22 1141 684 mg at 11/25/22 1141   oxaliplatin (ELOXATIN) 145 mg in dextrose 5 % 500 mL chemo infusion  85 mg/m2 (Treatment Plan Recorded) Intravenous Once Lloyd Huger, MD 265 mL/hr at 11/25/22 1144 145 mg at 11/25/22 1144   sodium chloride flush (NS) 0.9 % injection 10 mL  10 mL Intracatheter PRN Lloyd Huger, MD   10 mL at 10/09/22 1353    OBJECTIVE: Vitals:   11/25/22 0855  BP: (!) 160/73  Pulse: 74  Resp: 16  Temp: 98.3 F (36.8 C)  SpO2: 97%      Body mass index is 23.65 kg/m.    ECOG FS:0 - Asymptomatic  General: Well-developed, well-nourished, no acute  distress. Eyes: Pink conjunctiva, anicteric sclera. HEENT: Normocephalic, moist mucous membranes. Lungs: No audible wheezing or coughing. Heart: Regular rate and rhythm. Abdomen: Soft, nontender, no obvious distention. Musculoskeletal: No edema, cyanosis, or clubbing. Neuro: Alert, answering all questions appropriately. Cranial nerves grossly intact. Skin: No rashes or petechiae noted. Psych: Normal affect.  LAB RESULTS:  Lab Results  Component Value Date   NA 141 11/25/2022   K 3.2 (L) 11/25/2022   CL 107 11/25/2022   CO2 28 11/25/2022   GLUCOSE 130 (H) 11/25/2022   BUN 14 11/25/2022   CREATININE 0.73 11/25/2022   CALCIUM 8.4 (L) 11/25/2022   PROT 6.7 11/25/2022   ALBUMIN 3.5 11/25/2022   AST 19 11/25/2022   ALT 9 11/25/2022   ALKPHOS 43 11/25/2022   BILITOT 0.4 11/25/2022   GFRNONAA >60 11/25/2022    Lab Results  Component  Value Date   WBC 4.5 11/25/2022   NEUTROABS 2.8 11/25/2022   HGB 8.6 (L) 11/25/2022   HCT 26.7 (L) 11/25/2022   MCV 88.1 11/25/2022   PLT 119 (L) 11/25/2022     STUDIES: No results found.  ASSESSMENT: Stage IIIc colon cancer.  PLAN:    Stage IIIc colon cancer: Patient underwent complete surgical resection at [facility] on May 23, 2022.  Large colon mass was noted to partially invade stomach as well as surrounding mesentery.  PET scan did not reveal any metastatic disease other than a positive lung nodule which was confirmed to be a second primary.  Patient's preop CEA was 171.  Patient will require adjuvant chemotherapy using FOLFOX every 2 weeks x 12.  Patient has now had port placement.  Proceed with cycle 10 of treatment today.  Turn to clinic in 2 days for pump removal and then in 2 weeks for further evaluation and consideration of cycle 11. 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  Patient completed XRT in May 2023.  CT scan results from September 01, 2022 reviewed  independently with partial regression of nodule indicating treatment effect.  Repeat CT scan of chest in approximately April 2024. Pain: Resolved. Anemia: Patient's hemoglobin has trended down slightly to 8.6, monitor.  Thrombocytopenia: Platelets have decreased to 119.  Proceed with treatment as above. Hyperglycemia: Patient has improved blood glucose control.   Hypokalemia: Mild.  Patient was given dietary recommendations and also received an additional 20 mill equivalents IV potassium today. Neutropenia: Resolved.   Shortness of breath/peripheral edema: Patient does not complain of this today.   Hypomagnesia: Resolved.  Patient expressed understanding and was in agreement with this plan. She also understands that She can call clinic at any time with any questions, concerns, or complaints.    Cancer Staging  Colon cancer [facility]) Staging form: Colon and Rectum, AJCC 8th Edition - Pathologic stage from 06/10/2022: Stage IIIC (pT4b, pN1c, cM0) - Signed by Lloyd Huger, MD on 06/10/2022 Stage prefix: Initial diagnosis Total positive nodes: 0 Histologic grading system: 4 grade system Histologic grade (G): G3   Lloyd Huger, MD   11/25/2022 12:16 PM

## 2022-11-27 ENCOUNTER — Inpatient Hospital Stay: Payer: Medicare Other

## 2022-11-27 DIAGNOSIS — Z5111 Encounter for antineoplastic chemotherapy: Secondary | ICD-10-CM | POA: Diagnosis not present

## 2022-11-27 DIAGNOSIS — C184 Malignant neoplasm of transverse colon: Secondary | ICD-10-CM

## 2022-11-27 MED ORDER — HEPARIN SOD (PORK) LOCK FLUSH 100 UNIT/ML IV SOLN
500.0000 [IU] | Freq: Once | INTRAVENOUS | Status: AC | PRN
  Administered 2022-11-27: 500 [IU]
  Filled 2022-11-27: qty 5

## 2022-11-27 MED ORDER — SODIUM CHLORIDE 0.9% FLUSH
10.0000 mL | INTRAVENOUS | Status: DC | PRN
  Administered 2022-11-27: 10 mL
  Filled 2022-11-27: qty 10

## 2022-12-08 MED FILL — Dexamethasone Sodium Phosphate Inj 100 MG/10ML: INTRAMUSCULAR | Qty: 1 | Status: AC

## 2022-12-09 ENCOUNTER — Ambulatory Visit: Payer: Medicare Other | Admitting: Oncology

## 2022-12-09 ENCOUNTER — Encounter: Payer: Self-pay | Admitting: Cardiology

## 2022-12-09 ENCOUNTER — Ambulatory Visit: Payer: Medicare Other | Attending: Cardiovascular Disease | Admitting: Cardiology

## 2022-12-09 ENCOUNTER — Other Ambulatory Visit: Payer: Medicare Other

## 2022-12-09 ENCOUNTER — Encounter: Payer: Self-pay | Admitting: Oncology

## 2022-12-09 ENCOUNTER — Ambulatory Visit: Payer: Medicare Other

## 2022-12-09 ENCOUNTER — Inpatient Hospital Stay (HOSPITAL_BASED_OUTPATIENT_CLINIC_OR_DEPARTMENT_OTHER): Payer: Medicare Other | Admitting: Oncology

## 2022-12-09 ENCOUNTER — Inpatient Hospital Stay: Payer: Medicare Other

## 2022-12-09 ENCOUNTER — Inpatient Hospital Stay: Payer: Medicare Other | Attending: Oncology

## 2022-12-09 VITALS — BP 162/88 | HR 80 | Ht 64.0 in | Wt 137.6 lb

## 2022-12-09 VITALS — BP 156/75 | HR 81 | Temp 97.7°F | Resp 17 | Wt 136.6 lb

## 2022-12-09 DIAGNOSIS — Z5111 Encounter for antineoplastic chemotherapy: Secondary | ICD-10-CM | POA: Diagnosis present

## 2022-12-09 DIAGNOSIS — I509 Heart failure, unspecified: Secondary | ICD-10-CM | POA: Diagnosis not present

## 2022-12-09 DIAGNOSIS — C184 Malignant neoplasm of transverse colon: Secondary | ICD-10-CM

## 2022-12-09 DIAGNOSIS — C3432 Malignant neoplasm of lower lobe, left bronchus or lung: Secondary | ICD-10-CM | POA: Insufficient documentation

## 2022-12-09 DIAGNOSIS — Z923 Personal history of irradiation: Secondary | ICD-10-CM | POA: Diagnosis not present

## 2022-12-09 DIAGNOSIS — I10 Essential (primary) hypertension: Secondary | ICD-10-CM

## 2022-12-09 DIAGNOSIS — J449 Chronic obstructive pulmonary disease, unspecified: Secondary | ICD-10-CM

## 2022-12-09 DIAGNOSIS — D649 Anemia, unspecified: Secondary | ICD-10-CM | POA: Diagnosis not present

## 2022-12-09 DIAGNOSIS — D696 Thrombocytopenia, unspecified: Secondary | ICD-10-CM | POA: Diagnosis not present

## 2022-12-09 DIAGNOSIS — I2510 Atherosclerotic heart disease of native coronary artery without angina pectoris: Secondary | ICD-10-CM

## 2022-12-09 DIAGNOSIS — Z7289 Other problems related to lifestyle: Secondary | ICD-10-CM | POA: Diagnosis present

## 2022-12-09 DIAGNOSIS — I5032 Chronic diastolic (congestive) heart failure: Secondary | ICD-10-CM

## 2022-12-09 DIAGNOSIS — I110 Hypertensive heart disease with heart failure: Secondary | ICD-10-CM | POA: Insufficient documentation

## 2022-12-09 DIAGNOSIS — E1165 Type 2 diabetes mellitus with hyperglycemia: Secondary | ICD-10-CM | POA: Insufficient documentation

## 2022-12-09 DIAGNOSIS — F17290 Nicotine dependence, other tobacco product, uncomplicated: Secondary | ICD-10-CM | POA: Insufficient documentation

## 2022-12-09 DIAGNOSIS — C189 Malignant neoplasm of colon, unspecified: Secondary | ICD-10-CM | POA: Insufficient documentation

## 2022-12-09 LAB — COMPREHENSIVE METABOLIC PANEL
ALT: 12 U/L (ref 0–44)
AST: 22 U/L (ref 15–41)
Albumin: 3.6 g/dL (ref 3.5–5.0)
Alkaline Phosphatase: 49 U/L (ref 38–126)
Anion gap: 8 (ref 5–15)
BUN: 16 mg/dL (ref 8–23)
CO2: 27 mmol/L (ref 22–32)
Calcium: 9.2 mg/dL (ref 8.9–10.3)
Chloride: 105 mmol/L (ref 98–111)
Creatinine, Ser: 0.72 mg/dL (ref 0.44–1.00)
GFR, Estimated: >60 mL/min (ref >60)
Glucose, Bld: 131 mg/dL — ABNORMAL HIGH (ref 70–99)
Potassium: 4.1 mmol/L (ref 3.5–5.1)
Sodium: 140 mmol/L (ref 135–145)
Total Bilirubin: 0.6 mg/dL (ref 0.3–1.2)
Total Protein: 6.7 g/dL (ref 6.5–8.1)

## 2022-12-09 LAB — CBC WITH DIFFERENTIAL/PLATELET
Abs Immature Granulocytes: 0.01 10*3/uL (ref 0.00–0.07)
Basophils Absolute: 0 10*3/uL (ref 0.0–0.1)
Basophils Relative: 1 %
Eosinophils Absolute: 0 10*3/uL (ref 0.0–0.5)
Eosinophils Relative: 1 %
HCT: 27 % — ABNORMAL LOW (ref 36.0–46.0)
Hemoglobin: 8.7 g/dL — ABNORMAL LOW (ref 12.0–15.0)
Immature Granulocytes: 0 %
Lymphocytes Relative: 24 %
Lymphs Abs: 1 10*3/uL (ref 0.7–4.0)
MCH: 29.1 pg (ref 26.0–34.0)
MCHC: 32.2 g/dL (ref 30.0–36.0)
MCV: 90.3 fL (ref 80.0–100.0)
Monocytes Absolute: 0.7 10*3/uL (ref 0.1–1.0)
Monocytes Relative: 15 %
Neutro Abs: 2.6 10*3/uL (ref 1.7–7.7)
Neutrophils Relative %: 59 %
Platelets: 111 10*3/uL — ABNORMAL LOW (ref 150–400)
RBC: 2.99 MIL/uL — ABNORMAL LOW (ref 3.87–5.11)
RDW: 21.3 % — ABNORMAL HIGH (ref 11.5–15.5)
WBC: 4.3 10*3/uL (ref 4.0–10.5)
nRBC: 0 % (ref 0.0–0.2)

## 2022-12-09 LAB — MAGNESIUM: Magnesium: 1.7 mg/dL (ref 1.7–2.4)

## 2022-12-09 MED ORDER — OXALIPLATIN CHEMO INJECTION 100 MG/20ML
85.0000 mg/m2 | Freq: Once | INTRAVENOUS | Status: AC
  Administered 2022-12-09: 145 mg via INTRAVENOUS
  Filled 2022-12-09: qty 20

## 2022-12-09 MED ORDER — DEXTROSE 5 % IV SOLN
Freq: Once | INTRAVENOUS | Status: AC
  Filled 2022-12-09: qty 250

## 2022-12-09 MED ORDER — LEUCOVORIN CALCIUM INJECTION 350 MG
400.0000 mg/m2 | Freq: Once | INTRAVENOUS | Status: AC
  Administered 2022-12-09: 684 mg via INTRAVENOUS
  Filled 2022-12-09: qty 34.2

## 2022-12-09 MED ORDER — SODIUM CHLORIDE 0.9 % IV SOLN
10.0000 mg | Freq: Once | INTRAVENOUS | Status: AC
  Administered 2022-12-09: 10 mg via INTRAVENOUS
  Filled 2022-12-09: qty 10

## 2022-12-09 MED ORDER — SODIUM CHLORIDE 0.9 % IV SOLN
2400.0000 mg/m2 | INTRAVENOUS | Status: DC
  Administered 2022-12-09: 4100 mg via INTRAVENOUS
  Filled 2022-12-09: qty 82

## 2022-12-09 MED ORDER — FLUOROURACIL CHEMO INJECTION 2.5 GM/50ML
400.0000 mg/m2 | Freq: Once | INTRAVENOUS | Status: AC
  Administered 2022-12-09: 700 mg via INTRAVENOUS
  Filled 2022-12-09: qty 14

## 2022-12-09 MED ORDER — PALONOSETRON HCL INJECTION 0.25 MG/5ML
0.2500 mg | Freq: Once | INTRAVENOUS | Status: AC
  Administered 2022-12-09: 0.25 mg via INTRAVENOUS
  Filled 2022-12-09: qty 5

## 2022-12-09 NOTE — Progress Notes (Signed)
Doing well. Pt states she is feeling good. Has some fatigue. Left knee is bothering her.

## 2022-12-09 NOTE — Patient Instructions (Signed)
Medication Instructions:   Your physician recommends that you continue on your current medications as directed. Please refer to the Current Medication list given to you today.  *If you need a refill on your cardiac medications before your next appointment, please call your pharmacy*   Lab Work:  None Ordered  If you have labs (blood work) drawn today and your tests are completely normal, you will receive your results only by: [place] (if you have MyChart) OR A paper copy in the mail If you have any lab test that is abnormal or we need to change your treatment, we will call you to review the results.   Testing/Procedures:  Echocardiogram   Your physician has requested that you have an echocardiogram. Echocardiography is a painless test that uses sound waves to create images of your heart. It provides your doctor with information about the size and shape of your heart and how well your heart's chambers and valves are working. This procedure takes approximately one hour. There are no restrictions for this procedure. Please note; depending on visual quality an IV may need to be placed.     Follow-Up: At [facility], you and your health needs are our priority.  As part of our continuing mission to provide you with exceptional heart care, we have created designated Provider Care Teams.  These Care Teams include your primary Cardiologist (physician) and Advanced Practice Providers (APPs -  Physician Assistants and Nurse Practitioners) who all work together to provide you with the care you need, when you need it.  We recommend signing up for the patient portal called "MyChart".  Sign up information is provided on this After Visit Summary.  MyChart is used to connect with patients for Virtual Visits (Telemedicine).  Patients are able to view lab/test results, encounter notes, upcoming appointments, etc.  Non-urgent messages can be sent to your provider as well.   To learn more  about what you can do with MyChart, go to [email or web address].    Your next appointment:    After Echocardiogram  The format for your next appointment:   In Person  Provider:   You may see Kate Sable, MD or one of the following Advanced Practice Providers on your designated Care Team:   Murray Hodgkins, NP Christell Faith, PA-C Cadence Kathlen Mody, PA-C Gerrie Nordmann, NP

## 2022-12-09 NOTE — Progress Notes (Signed)
Cardiology Office Note:    Date:  12/09/2022   ID:  Jessica Stout, DOB January 24, 1951, MRN [id number]  PCP:  Albina Billet, MD   [place] Providers Cardiologist:  Kate Sable, MD     Referring MD: Albina Billet, MD   Chief Complaint  Patient presents with   New Patient (Initial Visit)    Congestive Heart failure, Hyperlipidemia, Cardiac Hx, occasional SOB    History of Present Illness:    Jessica Stout is a 72 y.o. female with a hx of CAD (Left heart cath 04/10/2022- 50% LAD, 30% mid RCA, occluded left circumflex artery), HFpEF, hypertension,  smoker x 40+ years, currently vapes, COPD, colon cancer s/p resection 05/2022 , anemia who presents due to history of hypertension who presents to establish.  Previously seen at [facility] from a cardiac perspective.  Has history of HFpEF, CAD, managed with Lasix, Toprol-XL, Lipitor., beta-blocker.  Not on aspirin due to anemia.  Currently undergoing chemo due to colon cancer.  States having shortness of breath, provided oxygen by previous cardiologist.  Does not have a lung doctor.  Denies chest pain, denies edema.  Lasix was stopped due to low potassium and magnesium.   Echocardiogram 01/2022 EF 45 to 50%. Left heart cath 04/10/2022- 50% LAD, 30% mid RCA, occluded left circumflex artery  Past Medical History:  Diagnosis Date   Anemia    Anxiety    Asthma    Cancer (HCC)    colon   CHF (congestive heart failure) (HCC)    COPD (chronic obstructive pulmonary disease) ([place])    Diabetes mellitus without complication ([place])    Hyperlipidemia    Hypertension    Stroke [facility])     Past Surgical History:  Procedure Laterality Date   COLONOSCOPY WITH PROPOFOL N/A 02/03/2022   Procedure: COLONOSCOPY WITH PROPOFOL;  Surgeon: Lin Landsman, MD;  Location: [facility] ENDOSCOPY;  Service: Gastroenterology;  Laterality: N/A;   ESOPHAGOGASTRODUODENOSCOPY (EGD) WITH PROPOFOL N/A 02/03/2022   Procedure: ESOPHAGOGASTRODUODENOSCOPY (EGD) WITH PROPOFOL;   Surgeon: Lin Landsman, MD;  Location: [facility] ENDOSCOPY;  Service: Gastroenterology;  Laterality: N/A;   GIVENS CAPSULE STUDY N/A 02/03/2022   Procedure: GIVENS CAPSULE STUDY;  Surgeon: Lin Landsman, MD;  Location: [facility] ENDOSCOPY;  Service: Gastroenterology;  Laterality: N/A;  possible Capsule Study   IR IMAGING GUIDED PORT INSERTION  06/18/2022    Current Medications: Current Meds  Medication Sig   acetaminophen (TYLENOL) 325 MG tablet Take 650 mg by mouth every 6 (six) hours as needed.   albuterol (VENTOLIN HFA) 108 (90 Base) MCG/ACT inhaler Inhale into the lungs.   atorvastatin (LIPITOR) 40 MG tablet Take 40 mg by mouth daily.   fenofibrate 54 MG tablet Take 54 mg by mouth daily.   gabapentin (NEURONTIN) 600 MG tablet Take by mouth 2 (two) times daily.   LANTUS SOLOSTAR 100 UNIT/ML Solostar Pen Inject 12 Units into the skin at bedtime.   lisinopril (ZESTRIL) 20 MG tablet Take 20 mg by mouth daily.   Magnesium Citrate 200 MG TABS Take 1 tablet by mouth in the morning and at bedtime. 250 twice daily   metoprolol succinate (TOPROL-XL) 50 MG 24 hr tablet Take 50 mg by mouth daily. Take with or immediately following a meal.   pantoprazole (PROTONIX) 40 MG tablet Take 1 tablet (40 mg total) by mouth daily.   ranolazine (RANEXA) 500 MG 12 hr tablet Take 500 mg by mouth 2 (two) times daily.   sitaGLIPtin-metformin (JANUMET) 50-1000 MG tablet Take  1 tablet by mouth daily.   venlafaxine XR (EFFEXOR-XR) 75 MG 24 hr capsule Take 75 mg by mouth daily.     Allergies:   Codeine   Social History   Socioeconomic History   Marital status: Married    Spouse name: Not on file   Number of children: Not on file   Years of education: Not on file   Highest education level: Not on file  Occupational History   Not on file  Tobacco Use   Smoking status: Every Day    Types: E-cigarettes   Smokeless tobacco: Not on file  Substance and Sexual Activity   Alcohol use: Yes    Comment: rare    Drug use: Never   Sexual activity: Not on file  Other Topics Concern   Not on file  Social History Narrative   Not on file   Social Determinants of Health   Financial Resource Strain: Not on file  Food Insecurity: No Food Insecurity (10/11/2022)   Hunger Vital Sign    Worried About Running Out of Food in the Last Year: Never true    Ran Out of Food in the Last Year: Never true  Transportation Needs: No Transportation Needs (10/11/2022)   PRAPARE - Hydrologist (Medical): No    Lack of Transportation (Non-Medical): No  Physical Activity: Not on file  Stress: Not on file  Social Connections: Not on file     Family History: The patient's family history includes Colon cancer in her father; Diabetes in her mother; Heart attack in her father.  ROS:   Please see the history of present illness.     All other systems reviewed and are negative.  EKGs/Labs/Other Studies Reviewed:    The following studies were reviewed today:   EKG:  EKG is  ordered today.  The ekg ordered today demonstrates normal sinus rhythm, normal ECG  Recent Labs: 10/11/2022: B Natriuretic Peptide 118.0 11/25/2022: ALT 9; BUN 14; Creatinine, Ser 0.73; Hemoglobin 8.6; Magnesium 1.7; Platelets 119; Potassium 3.2; Sodium 141  Recent Lipid Panel    Component Value Date/Time   CHOL 108 01/30/2022 0524   TRIG 119 01/30/2022 0524   HDL 36 (L) 01/30/2022 0524   CHOLHDL 3.0 01/30/2022 0524   VLDL 24 01/30/2022 0524   LDLCALC 48 01/30/2022 0524     Risk Assessment/Calculations:        Physical Exam:    VS:  BP (!) 162/88 (BP Location: Left Arm, Patient Position: Sitting, Cuff Size: Normal)   Pulse 80   Ht 5\' 4"  (1.626 m)   Wt 137 lb 9.6 oz (62.4 kg)   SpO2 92% Comment: currently not using  BMI 23.62 kg/m     Wt Readings from Last 3 Encounters:  12/09/22 137 lb 9.6 oz (62.4 kg)  11/25/22 137 lb 12.8 oz (62.5 kg)  11/11/22 138 lb 8 oz (62.8 kg)     GEN:  Well nourished,  well developed in no acute distress HEENT: Normal NECK: No JVD; No carotid bruits CARDIAC: RRR, no murmurs, rubs, gallops RESPIRATORY: Diminished breath sounds bilaterally, no wheezing ABDOMEN: Soft, non-tender, non-distended MUSCULOSKELETAL:  No edema; No deformity  SKIN: Warm and dry NEUROLOGIC:  Alert and oriented x 3 PSYCHIATRIC:  Normal affect   ASSESSMENT:    1. Chronic diastolic heart failure ([place])   2. Coronary artery disease involving native coronary artery of native heart, unspecified whether angina present   3. Primary hypertension   4. Chronic  obstructive pulmonary disease, unspecified COPD type ([place])   5. Engages in [place]:    In order of problems listed above:  HFpEF, appears euvolemic.  Previous EF 45 to 50%.  Lasix because low magnesium and hypokalemia.  Continue to hold Lasix. CAD, occluded left circumflex, 50% LAD, 30% RCA.  Denies chest pain.  Significant anemia, not candidate for antiplatelets.  LDL at goal.  Continue Lipitor, fenofibrate.  Get echo Hypertension, BP elevated, controlled at home.  Continue lisinopril, Toprol-XL.  Consider titrating if BP stays elevated. COPD, hypoxia, refer to pulmonary medicine for management. Engages in [place], cessation advised.  Follow-up after echocardiogram.     Medication Adjustments/Labs and Tests Ordered: Current medicines are reviewed at length with the patient today.  Concerns regarding medicines are outlined above.  Orders Placed This Encounter  Procedures   Ambulatory referral to Pulmonology   EKG 12-Lead   ECHOCARDIOGRAM COMPLETE   No orders of the defined types were placed in this encounter.   Patient Instructions  Medication Instructions:   Your physician recommends that you continue on your current medications as directed. Please refer to the Current Medication list given to you today.  *If you need a refill on your cardiac medications before your next appointment, please call your  pharmacy*   Lab Work:  None Ordered  If you have labs (blood work) drawn today and your tests are completely normal, you will receive your results only by: [place] (if you have MyChart) OR A paper copy in the mail If you have any lab test that is abnormal or we need to change your treatment, we will call you to review the results.   Testing/Procedures:  Echocardiogram   Your physician has requested that you have an echocardiogram. Echocardiography is a painless test that uses sound waves to create images of your heart. It provides your doctor with information about the size and shape of your heart and how well your heart's chambers and valves are working. This procedure takes approximately one hour. There are no restrictions for this procedure. Please note; depending on visual quality an IV may need to be placed.     Follow-Up: At [facility], you and your health needs are our priority.  As part of our continuing mission to provide you with exceptional heart care, we have created designated Provider Care Teams.  These Care Teams include your primary Cardiologist (physician) and Advanced Practice Providers (APPs -  Physician Assistants and Nurse Practitioners) who all work together to provide you with the care you need, when you need it.  We recommend signing up for the patient portal called "MyChart".  Sign up information is provided on this After Visit Summary.  MyChart is used to connect with patients for Virtual Visits (Telemedicine).  Patients are able to view lab/test results, encounter notes, upcoming appointments, etc.  Non-urgent messages can be sent to your provider as well.   To learn more about what you can do with MyChart, go to [email or web address].    Your next appointment:    After Echocardiogram  The format for your next appointment:   In Person  Provider:   You may see Kate Sable, MD or one of the following Advanced Practice Providers on  your designated Care Team:   Murray Hodgkins, NP Christell Faith, PA-C Cadence Kathlen Mody, PA-C Gerrie Nordmann, NP       Signed, Kate Sable, MD  12/09/2022 9:51 AM    [place]

## 2022-12-09 NOTE — Progress Notes (Signed)
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  INTERVAL HISTORY: Patient returns to clinic today for further evaluation and consideration of cycle 11 of adjuvant FOLFOX.  She currently feels well and is asymptomatic.  She does not complain of abdominal bloating or pain today.  She denies any weakness or fatigue.  She has no neurologic complaints.  She denies any recent fevers.  She has no chest pain, cough, shortness of breath, or hemoptysis, although she does admit to occasionally using her oxygen at night.  She denies any nausea, vomiting, constipation, or diarrhea.  She has no melena or hematochezia.  She has no urinary complaints.  Patient offers no specific complaints.  REVIEW OF SYSTEMS:   Review of Systems  Constitutional: Negative.  Negative for fever, malaise/fatigue and weight loss.  Respiratory: Negative.  Negative for cough, hemoptysis and shortness of breath.   Cardiovascular: Negative.  Negative for chest pain and leg swelling.  Gastrointestinal: Negative.  Negative for abdominal pain, blood in stool, constipation, diarrhea, melena, nausea and vomiting.  Genitourinary: Negative.  Negative for dysuria.  Musculoskeletal: Negative.  Negative for back pain.  Skin: Negative.  Negative for rash.  Neurological: Negative.  Negative for dizziness, focal weakness, weakness and headaches.  Psychiatric/Behavioral: Negative.  The patient is not nervous/anxious.     As per HPI. Otherwise, a complete review of systems is negative.  PAST MEDICAL HISTORY: Past Medical  History:  Diagnosis Date   Anemia    Anxiety    Asthma    Cancer ([place])    colon   CHF (congestive heart failure) (HCC)    COPD (chronic obstructive pulmonary disease) ([place])    Diabetes mellitus without complication ([place])    Hyperlipidemia    Hypertension    Stroke ([place])     PAST SURGICAL HISTORY: Past Surgical History:  Procedure Laterality Date   COLONOSCOPY WITH PROPOFOL N/A 02/03/2022   Procedure: COLONOSCOPY WITH PROPOFOL;  Surgeon: Lin Landsman, MD;  Location: [facility] ENDOSCOPY;  Service: Gastroenterology;  Laterality: N/A;   ESOPHAGOGASTRODUODENOSCOPY (EGD) WITH PROPOFOL N/A 02/03/2022   Procedure: ESOPHAGOGASTRODUODENOSCOPY (EGD) WITH PROPOFOL;  Surgeon: Lin Landsman, MD;  Location: [facility] ENDOSCOPY;  Service: Gastroenterology;  Laterality: N/A;   GIVENS CAPSULE STUDY N/A 02/03/2022   Procedure: GIVENS CAPSULE STUDY;  Surgeon: Lin Landsman, MD;  Location: [facility] ENDOSCOPY;  Service: Gastroenterology;  Laterality: N/A;  possible Capsule Study   IR IMAGING GUIDED PORT INSERTION  06/18/2022    FAMILY HISTORY: Family History  Problem Relation Age of Onset   Diabetes Mother    Heart attack Father    Colon cancer Father     ADVANCED DIRECTIVES (Y/N):  N  HEALTH MAINTENANCE: Social History   Tobacco Use   Smoking status: Every Day    Types: E-cigarettes  Substance Use Topics   Alcohol use: Yes    Comment: rare   Drug use: Never     Colonoscopy:  PAP:  Bone density:  Lipid panel:  Allergies  Allergen Reactions   Codeine Rash    Happened in 1970s    Current Outpatient Medications  Medication Sig Dispense Refill  acetaminophen (TYLENOL) 325 MG tablet Take 650 mg by mouth every 6 (six) hours as needed.     albuterol (VENTOLIN HFA) 108 (90 Base) MCG/ACT inhaler Inhale into the lungs.     atorvastatin (LIPITOR) 40 MG tablet Take 40 mg by mouth daily.     fenofibrate 54 MG tablet Take 54 mg by mouth daily.     gabapentin (NEURONTIN) 600 MG tablet  Take by mouth 2 (two) times daily.     LANTUS SOLOSTAR 100 UNIT/ML Solostar Pen Inject 12 Units into the skin at bedtime.     lisinopril (ZESTRIL) 20 MG tablet Take 20 mg by mouth daily.     Magnesium Citrate 200 MG TABS Take 1 tablet by mouth in the morning and at bedtime. 250 twice daily     metoprolol succinate (TOPROL-XL) 50 MG 24 hr tablet Take 50 mg by mouth daily. Take with or immediately following a meal.     pantoprazole (PROTONIX) 40 MG tablet Take 1 tablet (40 mg total) by mouth daily. 30 tablet 5   ranolazine (RANEXA) 500 MG 12 hr tablet Take 500 mg by mouth 2 (two) times daily.     sitaGLIPtin-metformin (JANUMET) 50-1000 MG tablet Take 1 tablet by mouth daily.     venlafaxine XR (EFFEXOR-XR) 75 MG 24 hr capsule Take 75 mg by mouth daily.     insulin lispro (HUMALOG) 100 UNIT/ML injection Inject 1-10 Units into the skin 3 (three) times daily before meals. Per sliding scale (Patient not taking: Reported on 12/09/2022)     No current facility-administered medications for this visit.   Facility-Administered Medications Ordered in Other Visits  Medication Dose Route Frequency Provider Last Rate Last Admin   sodium chloride flush (NS) 0.9 % injection 10 mL  10 mL Intracatheter PRN Lloyd Huger, MD   10 mL at 10/09/22 1353    OBJECTIVE: Vitals:   12/09/22 0952  BP: (!) 156/75  Pulse: 81  Resp: 17  Temp: 97.7 F (36.5 C)  SpO2: 95%      Body mass index is 23.45 kg/m.    ECOG FS:0 - Asymptomatic  General: Well-developed, well-nourished, no acute distress. Eyes: Pink conjunctiva, anicteric sclera. HEENT: Normocephalic, moist mucous membranes. Lungs: No audible wheezing or coughing. Heart: Regular rate and rhythm. Abdomen: Soft, nontender, no obvious distention. Musculoskeletal: No edema, cyanosis, or clubbing. Neuro: Alert, answering all questions appropriately. Cranial nerves grossly intact. Skin: No rashes or petechiae noted. Psych: Normal affect.  LAB  RESULTS:  Lab Results  Component Value Date   NA 140 12/09/2022   K 4.1 12/09/2022   CL 105 12/09/2022   CO2 27 12/09/2022   GLUCOSE 131 (H) 12/09/2022   BUN 16 12/09/2022   CREATININE 0.72 12/09/2022   CALCIUM 9.2 12/09/2022   PROT 6.7 12/09/2022   ALBUMIN 3.6 12/09/2022   AST 22 12/09/2022   ALT 12 12/09/2022   ALKPHOS 49 12/09/2022   BILITOT 0.6 12/09/2022   GFRNONAA >60 12/09/2022    Lab Results  Component Value Date   WBC 4.3 12/09/2022   NEUTROABS 2.6 12/09/2022   HGB 8.7 (L) 12/09/2022   HCT 27.0 (L) 12/09/2022   MCV 90.3 12/09/2022   PLT 111 (L) 12/09/2022     STUDIES: No results found.  ASSESSMENT: Stage IIIc colon cancer.  PLAN:    Stage IIIc colon cancer: Patient underwent complete surgical resection at [facility] on May 23, 2022.  Large colon mass was noted to partially invade stomach as well as  surrounding mesentery.  PET scan did not reveal any metastatic disease other than a positive lung nodule which was confirmed to be a second primary.  Patient's preop CEA was 171.  Patient will require adjuvant chemotherapy using FOLFOX every 2 weeks x 12.  Patient has now had port placement.  Proceed with cycle 11 of treatment today.  Return to clinic in 2 days for pump removal and then in 2 weeks for further evaluation and consideration of cycle 12.  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  Patient completed XRT in May 2023.  CT scan results from September 01, 2022 reviewed independently with partial regression of nodule indicating treatment effect.  Repeat CT scan of chest in approximately April 2024. Pain: Resolved. Anemia: Chronic and unchanged.  Patient's hemoglobin is 8.7 today.  Thrombocytopenia: Chronic and unchanged.  Patient's platelet count is 111 today.  Proceed with treatment as above.   Hyperglycemia: Patient has improved blood glucose control.   Hypokalemia: Resolved.  Continue  dietary recommendations.   Neutropenia: Resolved.   Shortness of breath/peripheral edema: Patient does not complain of this today.   Hypomagnesia: Resolved.  Patient expressed understanding and was in agreement with this plan. She also understands that She can call clinic at any time with any questions, concerns, or complaints.    Cancer Staging  Colon cancer [facility]) Staging form: Colon and Rectum, AJCC 8th Edition - Pathologic stage from 06/10/2022: Stage IIIC (pT4b, pN1c, cM0) - Signed by Lloyd Huger, MD on 06/10/2022 Stage prefix: Initial diagnosis Total positive nodes: 0 Histologic grading system: 4 grade system Histologic grade (G): G3   Lloyd Huger, MD   12/09/2022 10:38 AM

## 2022-12-11 ENCOUNTER — Telehealth: Payer: Self-pay | Admitting: Cardiology

## 2022-12-11 ENCOUNTER — Inpatient Hospital Stay: Payer: Medicare Other

## 2022-12-11 VITALS — BP 138/84 | HR 80 | Resp 18

## 2022-12-11 DIAGNOSIS — Z5111 Encounter for antineoplastic chemotherapy: Secondary | ICD-10-CM | POA: Diagnosis not present

## 2022-12-11 DIAGNOSIS — C184 Malignant neoplasm of transverse colon: Secondary | ICD-10-CM

## 2022-12-11 MED ORDER — SODIUM CHLORIDE 0.9% FLUSH
10.0000 mL | INTRAVENOUS | Status: DC | PRN
  Administered 2022-12-11: 10 mL
  Filled 2022-12-11: qty 10

## 2022-12-11 MED ORDER — HEPARIN SOD (PORK) LOCK FLUSH 100 UNIT/ML IV SOLN
500.0000 [IU] | Freq: Once | INTRAVENOUS | Status: AC | PRN
  Administered 2022-12-11: 500 [IU]
  Filled 2022-12-11: qty 5

## 2022-12-11 NOTE — Telephone Encounter (Signed)
Husband stated he will need orders to have oxygen stopped sent to [place].

## 2022-12-11 NOTE — Telephone Encounter (Signed)
Husband made aware and verbalized understanding

## 2022-12-11 NOTE — Telephone Encounter (Addendum)
Attempted to call Jessica Stout with MD recommendations.   Phone rang multiple times prior to [name] picking up. He advised he just spoke with one of our other nurses and advised of MD recommendations.

## 2022-12-11 NOTE — Telephone Encounter (Signed)
Kate Sable, MD  SentBenjamine Sprague  4:33 PM  To: Emily Filbert, RN  Cc: Bubba Hales, [place], CMA; P Cv Div [place] Triage         Message  Yes. Oxygen management as per pulmonary medicine.

## 2022-12-11 NOTE — Telephone Encounter (Signed)
Dr. Garen Lah- you just saw this patient on 12/09/22, but I do not see any discussion about discontinuing oxygen. Per notes, this may have been prescribed by a previous cardiologist. I presume this will need to be directed to Pulmonary- the patient is scheduled to see Dr. Genia Harold on 12/19/22.  Please advise.

## 2022-12-19 ENCOUNTER — Ambulatory Visit (INDEPENDENT_AMBULATORY_CARE_PROVIDER_SITE_OTHER): Payer: Medicare Other | Admitting: Student in an Organized Health Care Education/Training Program

## 2022-12-19 ENCOUNTER — Encounter: Payer: Self-pay | Admitting: Student in an Organized Health Care Education/Training Program

## 2022-12-19 VITALS — BP 132/62 | HR 80 | Temp 98.1°F | Ht 64.0 in | Wt 138.6 lb

## 2022-12-19 DIAGNOSIS — R0602 Shortness of breath: Secondary | ICD-10-CM | POA: Diagnosis not present

## 2022-12-19 NOTE — Progress Notes (Signed)
Synopsis: Referred in for hypoxia and shortness of breath.  Assessment & Plan:   1. Shortness of breath  She is presenting for the evaluation of shortness of breath and is requesting that her oxygen be discontinued. I did discuss her symptoms with her and have offered a pulmonary function test to further evaluate symptoms. She does not feel that her shortness of breath is severe enough to warrant therapy, and is not interested in having an inhaler. I will defer pulmonary function testing at the moment given this. I will also trend the patient in the office today, and should her oxygen levels be maintained, will discontinue her oxygen therapy.   Return if symptoms worsen or fail to improve.  I spent 45 minutes caring for this patient today, including preparing to see the patient, obtaining a medical history , reviewing a separately obtained history, performing a medically appropriate examination and/or evaluation, counseling and educating the patient/family/caregiver, ordering medications, tests, or procedures, and documenting clinical information in the electronic health record  Raechel Chute, MD [facility] 12/19/2022 11:58 AM    End of visit medications:  No orders of the defined types were placed in this encounter.    Current Outpatient Medications:    acetaminophen (TYLENOL) 325 MG tablet, Take 650 mg by mouth every 6 (six) hours as needed., Disp: , Rfl:    albuterol (VENTOLIN HFA) 108 (90 Base) MCG/ACT inhaler, Inhale into the lungs., Disp: , Rfl:    atorvastatin (LIPITOR) 40 MG tablet, Take 40 mg by mouth daily., Disp: , Rfl:    fenofibrate 54 MG tablet, Take 54 mg by mouth daily., Disp: , Rfl:    gabapentin (NEURONTIN) 600 MG tablet, Take by mouth 2 (two) times daily., Disp: , Rfl:    insulin lispro (HUMALOG) 100 UNIT/ML injection, Inject 1-10 Units into the skin 3 (three) times daily before meals. Per sliding scale, Disp: , Rfl:    LANTUS SOLOSTAR 100  UNIT/ML Solostar Pen, Inject 12 Units into the skin at bedtime., Disp: , Rfl:    lisinopril (ZESTRIL) 20 MG tablet, Take 20 mg by mouth daily., Disp: , Rfl:    Magnesium Citrate 200 MG TABS, Take 1 tablet by mouth in the morning and at bedtime. 250 twice daily, Disp: , Rfl:    metoprolol succinate (TOPROL-XL) 50 MG 24 hr tablet, Take 50 mg by mouth daily. Take with or immediately following a meal., Disp: , Rfl:    ranolazine (RANEXA) 500 MG 12 hr tablet, Take 500 mg by mouth 2 (two) times daily., Disp: , Rfl:    sitaGLIPtin-metformin (JANUMET) 50-1000 MG tablet, Take 1 tablet by mouth daily., Disp: , Rfl:    venlafaxine XR (EFFEXOR-XR) 75 MG 24 hr capsule, Take 75 mg by mouth daily., Disp: , Rfl:    pantoprazole (PROTONIX) 40 MG tablet, Take 1 tablet (40 mg total) by mouth daily., Disp: 30 tablet, Rfl: 5 No current facility-administered medications for this visit.  Facility-Administered Medications Ordered in Other Visits:    sodium chloride flush (NS) 0.9 % injection 10 mL, 10 mL, Intracatheter, PRN, Jeralyn Ruths, MD, 10 mL at 10/09/22 1353   Subjective:   PATIENT ID: Jessica Stout GENDER: female DOB: 1951-11-02, MRN: [id number]  Chief Complaint  Patient presents with   pulmonary consult    Hx of lung cancer. SOB with exertion and prod cough with clear sputum    HPI  Patient is a pleasant 72 year old female presenting to clinic to establish care.  She reports she's in her usual state of health and has no complaints at the moment. She reports a chronic shortness of breath that is unchanged and she attributes to her heart failure. She has been better controlled and her symptoms have improved. When asked what her chief complaint is, she reports wanting to have her oxygen discontinued. She has not been using it. She has an occasional cough, and her shortness of breath is with exertion. She does not have any night sweats, fevers, chills, chest pain, chest tightness, GI or GU  symptoms.  Patient was admitted in November of 2023 with respiratory failure secondary to acute decompensated HFrEF. She was was diuresed and her symptoms improved. She also has a history of Stage III colon cancer s/p resection at [facility] followed by chemotherapy. She was also found to have Stage I LLL squamous cell lung cancer s/p radiation therapy. She is followed by Dr. [name] and Dr. Rushie Chestnut.  Patient is a previous smoker, and reports quitting one year ago. She has vaped for the past year but plans on fully quitting everything tomorrow.  Ancillary information including prior medications, full medical/surgical/family/social histories, and PFTs (when available) are listed below and have been reviewed.   Review of Systems  Constitutional:  Negative for chills, fever, malaise/fatigue and weight loss.  Respiratory:  Positive for cough and shortness of breath. Negative for hemoptysis, sputum production and wheezing.   Cardiovascular:  Negative for chest pain and palpitations.     Objective:   Vitals:   12/19/22 1134  BP: 132/62  Pulse: 80  Temp: 98.1 F (36.7 C)  TempSrc: Temporal  SpO2: 91%  Weight: 138 lb 9.6 oz (62.9 kg)  Height: 5\' 4"  (1.626 m)   91% on RA  BMI Readings from Last 3 Encounters:  12/19/22 23.79 kg/m  12/09/22 23.45 kg/m  12/09/22 23.62 kg/m   Wt Readings from Last 3 Encounters:  12/19/22 138 lb 9.6 oz (62.9 kg)  12/09/22 136 lb 9.6 oz (62 kg)  12/09/22 137 lb 9.6 oz (62.4 kg)    Physical Exam Constitutional:      General: She is not in acute distress.    Appearance: Normal appearance. She is not ill-appearing.  Eyes:     Extraocular Movements: Extraocular movements intact.     Pupils: Pupils are equal, round, and reactive to light.  Cardiovascular:     Rate and Rhythm: Normal rate and regular rhythm.  Pulmonary:     Effort: Pulmonary effort is normal.     Breath sounds: Normal breath sounds. No wheezing or rales.  Abdominal:     Palpations:  Abdomen is soft.  Musculoskeletal:     Right lower leg: No edema.     Left lower leg: No edema.  Neurological:     General: No focal deficit present.     Mental Status: She is alert and oriented to person, place, and time. Mental status is at baseline.    Ancillary Information    Past Medical History:  Diagnosis Date   Anemia    Anxiety    Asthma    Cancer (HCC)    colon   CHF (congestive heart failure) (HCC)    COPD (chronic obstructive pulmonary disease) (HCC)    Diabetes mellitus without complication (HCC)    Hyperlipidemia    Hypertension    Stroke [facility])      Family History  Problem Relation Age of Onset   Diabetes Mother    Heart attack Father  Colon cancer Father      Past Surgical History:  Procedure Laterality Date   COLONOSCOPY WITH PROPOFOL N/A 02/03/2022   Procedure: COLONOSCOPY WITH PROPOFOL;  Surgeon: Toney Reil, MD;  Location: [facility] ENDOSCOPY;  Service: Gastroenterology;  Laterality: N/A;   ESOPHAGOGASTRODUODENOSCOPY (EGD) WITH PROPOFOL N/A 02/03/2022   Procedure: ESOPHAGOGASTRODUODENOSCOPY (EGD) WITH PROPOFOL;  Surgeon: Toney Reil, MD;  [facility] ENDOSCOPY;  Service: Gastroenterology;  Laterality: N/A;   GIVENS CAPSULE STUDY N/A 02/03/2022   Procedure: GIVENS CAPSULE STUDY;  Surgeon: Toney Reil, MD;  Location: [facility] ENDOSCOPY;  Service: Gastroenterology;  Laterality: N/A;  possible Capsule Study   IR IMAGING GUIDED PORT INSERTION  06/18/2022    Social History   Socioeconomic History   Marital status: Married    Spouse name: Not on file   Number of children: Not on file   Years of education: Not on file   Highest education level: Not on file  Occupational History   Not on file  Tobacco Use   Smoking status: Every Day    Types: E-cigarettes   Smokeless tobacco: Not on file   Tobacco comments:    Quit Cigarettes 12/2021  Substance and Sexual Activity   Alcohol use: Yes    Comment: rare   Drug use: Never   Sexual  activity: Not on file  Other Topics Concern   Not on file  Social History Narrative   Not on file   Social Determinants of Health   Financial Resource Strain: Not on file  Food Insecurity: No Food Insecurity (10/11/2022)   Hunger Vital Sign    Worried About Running Out of Food in the Last Year: Never true    Ran Out of Food in the Last Year: Never true  Transportation Needs: No Transportation Needs (10/11/2022)   PRAPARE - Administrator, Civil Service (Medical): No    Lack of Transportation (Non-Medical): No  Physical Activity: Not on file  Stress: Not on file  Social Connections: Not on file  Intimate Partner Violence: Not At Risk (10/11/2022)   Humiliation, Afraid, Rape, and Kick questionnaire    Fear of Current or Ex-Partner: No    Emotionally Abused: No    Physically Abused: No    Sexually Abused: No     Allergies  Allergen Reactions   Codeine Rash    Happened in 1970s     CBC    Component Value Date/Time   WBC 4.3 12/09/2022 0938   RBC 2.99 (L) 12/09/2022 0938   HGB 8.7 (L) 12/09/2022 0938   HCT 27.0 (L) 12/09/2022 0938   PLT 111 (L) 12/09/2022 0938   MCV 90.3 12/09/2022 0938   MCH 29.1 12/09/2022 0938   MCHC 32.2 12/09/2022 0938   RDW 21.3 (H) 12/09/2022 0938   LYMPHSABS 1.0 12/09/2022 0938   MONOABS 0.7 12/09/2022 0938   EOSABS 0.0 12/09/2022 0938   BASOSABS 0.0 12/09/2022 0938    Pulmonary Functions Testing Results:     No data to display          Outpatient Medications Prior to Visit  Medication Sig Dispense Refill   acetaminophen (TYLENOL) 325 MG tablet Take 650 mg by mouth every 6 (six) hours as needed.     albuterol (VENTOLIN HFA) 108 (90 Base) MCG/ACT inhaler Inhale into the lungs.     atorvastatin (LIPITOR) 40 MG tablet Take 40 mg by mouth daily.     fenofibrate 54 MG tablet Take 54 mg by mouth daily.  gabapentin (NEURONTIN) 600 MG tablet Take by mouth 2 (two) times daily.     insulin lispro (HUMALOG) 100 UNIT/ML  injection Inject 1-10 Units into the skin 3 (three) times daily before meals. Per sliding scale     LANTUS SOLOSTAR 100 UNIT/ML Solostar Pen Inject 12 Units into the skin at bedtime.     lisinopril (ZESTRIL) 20 MG tablet Take 20 mg by mouth daily.     Magnesium Citrate 200 MG TABS Take 1 tablet by mouth in the morning and at bedtime. 250 twice daily     metoprolol succinate (TOPROL-XL) 50 MG 24 hr tablet Take 50 mg by mouth daily. Take with or immediately following a meal.     ranolazine (RANEXA) 500 MG 12 hr tablet Take 500 mg by mouth 2 (two) times daily.     sitaGLIPtin-metformin (JANUMET) 50-1000 MG tablet Take 1 tablet by mouth daily.     venlafaxine XR (EFFEXOR-XR) 75 MG 24 hr capsule Take 75 mg by mouth daily.     pantoprazole (PROTONIX) 40 MG tablet Take 1 tablet (40 mg total) by mouth daily. 30 tablet 5   Facility-Administered Medications Prior to Visit  Medication Dose Route Frequency Provider Last Rate Last Admin   sodium chloride flush (NS) 0.9 % injection 10 mL  10 mL Intracatheter PRN Jeralyn Ruths, MD   10 mL at 10/09/22 1353

## 2022-12-22 ENCOUNTER — Encounter: Payer: Self-pay | Admitting: Oncology

## 2022-12-22 ENCOUNTER — Inpatient Hospital Stay: Payer: Medicare Other

## 2022-12-22 ENCOUNTER — Inpatient Hospital Stay (HOSPITAL_BASED_OUTPATIENT_CLINIC_OR_DEPARTMENT_OTHER): Payer: Medicare Other | Admitting: Oncology

## 2022-12-22 ENCOUNTER — Telehealth: Payer: Self-pay | Admitting: Student in an Organized Health Care Education/Training Program

## 2022-12-22 ENCOUNTER — Telehealth: Payer: Self-pay | Admitting: Cardiology

## 2022-12-22 DIAGNOSIS — C184 Malignant neoplasm of transverse colon: Secondary | ICD-10-CM

## 2022-12-22 DIAGNOSIS — Z5111 Encounter for antineoplastic chemotherapy: Secondary | ICD-10-CM | POA: Diagnosis not present

## 2022-12-22 LAB — CBC WITH DIFFERENTIAL/PLATELET
Abs Immature Granulocytes: 0.02 10*3/uL (ref 0.00–0.07)
Basophils Absolute: 0 10*3/uL (ref 0.0–0.1)
Basophils Relative: 0 %
Eosinophils Absolute: 0 10*3/uL (ref 0.0–0.5)
Eosinophils Relative: 1 %
HCT: 23.3 % — ABNORMAL LOW (ref 36.0–46.0)
Hemoglobin: 7.8 g/dL — ABNORMAL LOW (ref 12.0–15.0)
Immature Granulocytes: 0 %
Lymphocytes Relative: 13 %
Lymphs Abs: 0.6 10*3/uL — ABNORMAL LOW (ref 0.7–4.0)
MCH: 30.4 pg (ref 26.0–34.0)
MCHC: 33.5 g/dL (ref 30.0–36.0)
MCV: 90.7 fL (ref 80.0–100.0)
Monocytes Absolute: 0.6 10*3/uL (ref 0.1–1.0)
Monocytes Relative: 12 %
Neutro Abs: 3.5 10*3/uL (ref 1.7–7.7)
Neutrophils Relative %: 74 %
Platelets: 94 10*3/uL — ABNORMAL LOW (ref 150–400)
RBC: 2.57 MIL/uL — ABNORMAL LOW (ref 3.87–5.11)
RDW: 22.4 % — ABNORMAL HIGH (ref 11.5–15.5)
WBC: 4.8 10*3/uL (ref 4.0–10.5)
nRBC: 0 % (ref 0.0–0.2)

## 2022-12-22 LAB — COMPREHENSIVE METABOLIC PANEL
ALT: 10 U/L (ref 0–44)
AST: 19 U/L (ref 15–41)
Albumin: 3.3 g/dL — ABNORMAL LOW (ref 3.5–5.0)
Alkaline Phosphatase: 42 U/L (ref 38–126)
Anion gap: 10 (ref 5–15)
BUN: 26 mg/dL — ABNORMAL HIGH (ref 8–23)
CO2: 26 mmol/L (ref 22–32)
Calcium: 8.6 mg/dL — ABNORMAL LOW (ref 8.9–10.3)
Chloride: 101 mmol/L (ref 98–111)
Creatinine, Ser: 1.32 mg/dL — ABNORMAL HIGH (ref 0.44–1.00)
GFR, Estimated: 42 mL/min — ABNORMAL LOW (ref >60)
Glucose, Bld: 199 mg/dL — ABNORMAL HIGH (ref 70–99)
Potassium: 4 mmol/L (ref 3.5–5.1)
Sodium: 137 mmol/L (ref 135–145)
Total Bilirubin: 0.5 mg/dL (ref 0.3–1.2)
Total Protein: 6.5 g/dL (ref 6.5–8.1)

## 2022-12-22 LAB — MAGNESIUM: Magnesium: 1.8 mg/dL (ref 1.7–2.4)

## 2022-12-22 MED ORDER — SODIUM CHLORIDE 0.9% FLUSH
10.0000 mL | Freq: Once | INTRAVENOUS | Status: AC
  Administered 2022-12-22: 10 mL via INTRAVENOUS
  Filled 2022-12-22: qty 10

## 2022-12-22 MED ORDER — HEPARIN SOD (PORK) LOCK FLUSH 100 UNIT/ML IV SOLN
500.0000 [IU] | Freq: Once | INTRAVENOUS | Status: AC
  Administered 2022-12-22: 500 [IU] via INTRAVENOUS
  Filled 2022-12-22: qty 5

## 2022-12-22 NOTE — Progress Notes (Signed)
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  INTERVAL HISTORY: Patient returns to clinic today for further evaluation and consideration of cycle 12 of adjuvant FOLFOX.  She continues to feel well and remains asymptomatic. She does not complain of abdominal bloating or pain today.  She denies any weakness or fatigue.  She has no neurologic complaints.  She denies any recent fevers.  She has no chest pain, cough, shortness of breath, or hemoptysis, although she does admit to occasionally using her oxygen at night.  She denies any nausea, vomiting, constipation, or diarrhea.  She has no melena or hematochezia.  She has no urinary complaints.  Patient offers no specific complaints today.    REVIEW OF SYSTEMS:   Review of Systems  Constitutional: Negative.  Negative for fever, malaise/fatigue and weight loss.  Respiratory: Negative.  Negative for cough, hemoptysis and shortness of breath.   Cardiovascular: Negative.  Negative for chest pain and leg swelling.  Gastrointestinal: Negative.  Negative for abdominal pain, blood in stool, constipation, diarrhea, melena, nausea and vomiting.  Genitourinary: Negative.  Negative for dysuria.  Musculoskeletal: Negative.  Negative for back pain.  Skin: Negative.  Negative for rash.  Neurological: Negative.  Negative for dizziness, focal weakness, weakness and headaches.  Psychiatric/Behavioral: Negative.  The patient is not nervous/anxious.     As per HPI. Otherwise, a complete review of systems is negative.  PAST MEDICAL  HISTORY: Past Medical History:  Diagnosis Date   Anemia    Anxiety    Asthma    Cancer (HCC)    colon   CHF (congestive heart failure) (HCC)    COPD (chronic obstructive pulmonary disease) (HCC)    Diabetes mellitus without complication (HCC)    Hyperlipidemia    Hypertension    Stroke (HCC)     PAST SURGICAL HISTORY: Past Surgical History:  Procedure Laterality Date   COLONOSCOPY WITH PROPOFOL N/A 02/03/2022   Procedure: COLONOSCOPY WITH PROPOFOL;  Surgeon: Toney Reil, MD;  Location: [facility] ENDOSCOPY;  Service: Gastroenterology;  Laterality: N/A;   ESOPHAGOGASTRODUODENOSCOPY (EGD) WITH PROPOFOL N/A 02/03/2022   Procedure: ESOPHAGOGASTRODUODENOSCOPY (EGD) WITH PROPOFOL;  Surgeon: Toney Reil, MD;  Location: [facility] ENDOSCOPY;  Service: Gastroenterology;  Laterality: N/A;   GIVENS CAPSULE STUDY N/A 02/03/2022   Procedure: GIVENS CAPSULE STUDY;  Surgeon: Toney Reil, MD;  Location: [facility] ENDOSCOPY;  Service: Gastroenterology;  Laterality: N/A;  possible Capsule Study   IR IMAGING GUIDED PORT INSERTION  06/18/2022    FAMILY HISTORY: Family History  Problem Relation Age of Onset   Diabetes Mother    Heart attack Father    Colon cancer Father     ADVANCED DIRECTIVES (Y/N):  N  HEALTH MAINTENANCE: Social History   Tobacco Use   Smoking status: Every Day    Types: E-cigarettes   Tobacco comments:    Quit Cigarettes 12/2021  Substance Use Topics   Alcohol use: Yes    Comment: rare   Drug use: Never     Colonoscopy:  PAP:  Bone density:  Lipid panel:  Allergies  Allergen Reactions   Codeine Rash    Happened  in 1970s    Current Outpatient Medications  Medication Sig Dispense Refill   acetaminophen (TYLENOL) 325 MG tablet Take 650 mg by mouth every 6 (six) hours as needed.     albuterol (VENTOLIN HFA) 108 (90 Base) MCG/ACT inhaler Inhale into the lungs.     atorvastatin (LIPITOR) 40 MG tablet Take 40 mg by mouth daily.     fenofibrate 54 MG tablet  Take 54 mg by mouth daily.     gabapentin (NEURONTIN) 600 MG tablet Take by mouth 2 (two) times daily.     insulin lispro (HUMALOG) 100 UNIT/ML injection Inject 1-10 Units into the skin 3 (three) times daily before meals. Per sliding scale     LANTUS SOLOSTAR 100 UNIT/ML Solostar Pen Inject 12 Units into the skin at bedtime.     lisinopril (ZESTRIL) 20 MG tablet Take 20 mg by mouth daily.     Magnesium Citrate 200 MG TABS Take 1 tablet by mouth in the morning and at bedtime. 250 twice daily     metoprolol succinate (TOPROL-XL) 50 MG 24 hr tablet Take 50 mg by mouth daily. Take with or immediately following a meal.     ranolazine (RANEXA) 500 MG 12 hr tablet Take 500 mg by mouth 2 (two) times daily.     sitaGLIPtin-metformin (JANUMET) 50-1000 MG tablet Take 1 tablet by mouth daily.     venlafaxine XR (EFFEXOR-XR) 75 MG 24 hr capsule Take 75 mg by mouth daily.     pantoprazole (PROTONIX) 40 MG tablet Take 1 tablet (40 mg total) by mouth daily. 30 tablet 5   No current facility-administered medications for this visit.   Facility-Administered Medications Ordered in Other Visits  Medication Dose Route Frequency Provider Last Rate Last Admin   sodium chloride flush (NS) 0.9 % injection 10 mL  10 mL Intracatheter PRN Jeralyn Ruths, MD   10 mL at 10/09/22 1353    OBJECTIVE: Vitals:   12/22/22 1039  BP: (!) 141/64  Pulse: 85  Resp: 16  Temp: 98.5 F (36.9 C)  SpO2: 94%      Body mass index is 23.52 kg/m.    ECOG FS:0 - Asymptomatic  General: Well-developed, well-nourished, no acute distress. Eyes: Pink conjunctiva, anicteric sclera. HEENT: Normocephalic, moist mucous membranes. Lungs: No audible wheezing or coughing. Heart: Regular rate and rhythm. Abdomen: Soft, nontender, no obvious distention. Musculoskeletal: No edema, cyanosis, or clubbing. Neuro: Alert, answering all questions appropriately. Cranial nerves grossly intact. Skin: No rashes or petechiae noted. Psych: Normal  affect.  LAB RESULTS:  Lab Results  Component Value Date   NA 137 12/22/2022   K 4.0 12/22/2022   CL 101 12/22/2022   CO2 26 12/22/2022   GLUCOSE 199 (H) 12/22/2022   BUN 26 (H) 12/22/2022   CREATININE 1.32 (H) 12/22/2022   CALCIUM 8.6 (L) 12/22/2022   PROT 6.5 12/22/2022   ALBUMIN 3.3 (L) 12/22/2022   AST 19 12/22/2022   ALT 10 12/22/2022   ALKPHOS 42 12/22/2022   BILITOT 0.5 12/22/2022   GFRNONAA 42 (L) 12/22/2022    Lab Results  Component Value Date   WBC 4.8 12/22/2022   NEUTROABS 3.5 12/22/2022   HGB 7.8 (L) 12/22/2022   HCT 23.3 (L) 12/22/2022   MCV 90.7 12/22/2022   PLT 94 (L) 12/22/2022     STUDIES: No results found.  ASSESSMENT: Stage IIIc colon cancer.  PLAN:    Stage IIIc colon cancer: Patient underwent complete surgical resection at [facility] on May 23, 2022.  Large colon mass was noted to partially invade stomach as well as surrounding mesentery.  PET scan did not reveal any metastatic disease other than a positive lung nodule which was confirmed to be a second primary.  Patient's preop CEA was 171.  Today's result is pending.  Patient will require adjuvant chemotherapy using FOLFOX every 2 weeks x 12.  Patient has now had port placement.  Proceed with her 12th and final cycle of treatment tomorrow.  Return to clinic 2 days later for pump removal.  Patient will then return to clinic in 1 month with repeat laboratory work and further evaluation.   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  Patient completed XRT in May 2023.  CT scan results from September 01, 2022 reviewed independently with partial regression of nodule indicating treatment effect.  Repeat CT scan of chest in approximately April 2024 along with abdomen and pelvis CT as above. Pain: Resolved. Anemia: Hemoglobin has ended down to 7.8.  Proceed with treatment tomorrow.  Monitor.  Thrombocytopenia: Chronic and unchanged.   Patient's platelet count is 94.  Proceed with treatment as scheduled. Hyperglycemia: Patient has improved blood glucose control.   Hypokalemia: Resolved.  Continue dietary recommendations.   Neutropenia: Resolved.   Shortness of breath/peripheral edema: Patient does not complain of this today.   Hypomagnesia: Resolved.  Patient expressed understanding and was in agreement with this plan. She also understands that She can call clinic at any time with any questions, concerns, or complaints.    Cancer Staging  Colon cancer [facility]) Staging form: Colon and Rectum, AJCC 8th Edition - Pathologic stage from 06/10/2022: Stage IIIC (pT4b, pN1c, cM0) - Signed by Jeralyn Ruths, MD on 06/10/2022 Stage prefix: Initial diagnosis Total positive nodes: 0 Histologic grading system: 4 grade system Histologic grade (G): G3   Jeralyn Ruths, MD   12/22/2022 1:25 PM

## 2022-12-22 NOTE — Addendum Note (Signed)
Addended by: Luz Lex on: 12/22/2022 03:41 PM   Modules accepted: Orders

## 2022-12-22 NOTE — Telephone Encounter (Signed)
Husband stated patient will be wearing a chemo pack and is concerned if she can do the Echo test on 1/25.

## 2022-12-22 NOTE — Telephone Encounter (Signed)
Synetta Fail, can you help with this? Order was placed to [employer] 12/19/2022 to d/c oxygen.

## 2022-12-22 NOTE — Telephone Encounter (Signed)
I spoke with the patient's husband, Alfredo Bach (ok per [facility]).  He advised that the patient currently has a chemo pump that is connected to her port. This is all right sided, but he wanted to confirm this would not interfere with her echo that is scheduled for Thursday 12/25/22. I advised Alfredo Bach that we should still be able to perform the patient's echo as scheduled on Thursday regardless of her pump.  Alfredo Bach voices understanding and was appreciative of the call back.

## 2022-12-23 ENCOUNTER — Inpatient Hospital Stay: Payer: Medicare Other

## 2022-12-23 VITALS — BP 143/71 | HR 76 | Temp 98.2°F | Resp 17

## 2022-12-23 DIAGNOSIS — C184 Malignant neoplasm of transverse colon: Secondary | ICD-10-CM

## 2022-12-23 DIAGNOSIS — Z5111 Encounter for antineoplastic chemotherapy: Secondary | ICD-10-CM | POA: Diagnosis not present

## 2022-12-23 LAB — CEA: CEA: 18.6 ng/mL — ABNORMAL HIGH (ref 0.0–4.7)

## 2022-12-23 MED ORDER — SODIUM CHLORIDE 0.9 % IV SOLN
2400.0000 mg/m2 | INTRAVENOUS | Status: DC
  Administered 2022-12-23: 4100 mg via INTRAVENOUS
  Filled 2022-12-23: qty 82

## 2022-12-23 MED ORDER — LEUCOVORIN CALCIUM INJECTION 350 MG
400.0000 mg/m2 | Freq: Once | INTRAVENOUS | Status: AC
  Administered 2022-12-23: 684 mg via INTRAVENOUS
  Filled 2022-12-23: qty 34.2

## 2022-12-23 MED ORDER — DEXTROSE 5 % IV SOLN
Freq: Once | INTRAVENOUS | Status: AC
  Filled 2022-12-23: qty 250

## 2022-12-23 MED ORDER — FLUOROURACIL CHEMO INJECTION 2.5 GM/50ML
400.0000 mg/m2 | Freq: Once | INTRAVENOUS | Status: AC
  Administered 2022-12-23: 700 mg via INTRAVENOUS
  Filled 2022-12-23: qty 14

## 2022-12-23 MED ORDER — PALONOSETRON HCL INJECTION 0.25 MG/5ML
0.2500 mg | Freq: Once | INTRAVENOUS | Status: AC
  Administered 2022-12-23: 0.25 mg via INTRAVENOUS
  Filled 2022-12-23: qty 5

## 2022-12-23 MED ORDER — SODIUM CHLORIDE 0.9 % IV SOLN
10.0000 mg | Freq: Once | INTRAVENOUS | Status: AC
  Administered 2022-12-23: 10 mg via INTRAVENOUS
  Filled 2022-12-23: qty 10

## 2022-12-23 MED ORDER — OXALIPLATIN CHEMO INJECTION 100 MG/20ML
85.0000 mg/m2 | Freq: Once | INTRAVENOUS | Status: AC
  Administered 2022-12-23: 145 mg via INTRAVENOUS
  Filled 2022-12-23: qty 20

## 2022-12-25 ENCOUNTER — Ambulatory Visit: Payer: Medicare Other | Attending: Cardiovascular Disease

## 2022-12-25 ENCOUNTER — Inpatient Hospital Stay: Payer: Medicare Other

## 2022-12-25 VITALS — BP 140/80 | HR 78 | Resp 18

## 2022-12-25 DIAGNOSIS — C184 Malignant neoplasm of transverse colon: Secondary | ICD-10-CM

## 2022-12-25 DIAGNOSIS — I5032 Chronic diastolic (congestive) heart failure: Secondary | ICD-10-CM | POA: Insufficient documentation

## 2022-12-25 DIAGNOSIS — Z5111 Encounter for antineoplastic chemotherapy: Secondary | ICD-10-CM | POA: Diagnosis not present

## 2022-12-25 LAB — ECHOCARDIOGRAM COMPLETE
AR max vel: 2.93 cm2
AV Area VTI: 3.19 cm2
AV Area mean vel: 2.59 cm2
AV Mean grad: 3 mmHg
AV Peak grad: 3.8 mmHg
Ao pk vel: 0.98 m/s
Area-P 1/2: 3.16 cm2
S' Lateral: 4 cm

## 2022-12-25 MED ORDER — HEPARIN SOD (PORK) LOCK FLUSH 100 UNIT/ML IV SOLN
500.0000 [IU] | Freq: Once | INTRAVENOUS | Status: AC | PRN
  Administered 2022-12-25: 500 [IU]
  Filled 2022-12-25: qty 5

## 2022-12-25 MED ORDER — SODIUM CHLORIDE 0.9% FLUSH
10.0000 mL | INTRAVENOUS | Status: DC | PRN
  Administered 2022-12-25: 10 mL
  Filled 2022-12-25: qty 10

## 2022-12-25 NOTE — Telephone Encounter (Signed)
Rec'd message from [place] with [employer] "Yes we received. I have updated the pick up ticket for todays date."

## 2022-12-25 NOTE — Telephone Encounter (Signed)
I have sent another CM to [employer] to see if they place pickup ticket yet

## 2023-01-02 ENCOUNTER — Encounter: Payer: Self-pay | Admitting: Cardiology

## 2023-01-02 ENCOUNTER — Ambulatory Visit: Payer: Medicare Other | Attending: Cardiology | Admitting: Cardiology

## 2023-01-02 VITALS — BP 142/84 | HR 84 | Ht 64.0 in | Wt 136.6 lb

## 2023-01-02 DIAGNOSIS — I5032 Chronic diastolic (congestive) heart failure: Secondary | ICD-10-CM | POA: Insufficient documentation

## 2023-01-02 DIAGNOSIS — I10 Essential (primary) hypertension: Secondary | ICD-10-CM

## 2023-01-02 DIAGNOSIS — I2510 Atherosclerotic heart disease of native coronary artery without angina pectoris: Secondary | ICD-10-CM | POA: Diagnosis not present

## 2023-01-02 NOTE — Progress Notes (Signed)
Cardiology Office Note:    Date:  01/02/2023   ID:  Jessica Stout, DOB Apr 13, 1951, MRN [id number]  PCP:  Albina Billet, MD   [facility] Providers Cardiologist:  Kate Sable, MD     Referring MD: Albina Billet, MD   Chief Complaint  Patient presents with   Follow-up    Testing F/U, tired     History of Present Illness:    Jessica Stout is a 72 y.o. female with a hx of CAD (Left heart cath 04/10/2022- 50% LAD, 30% mid RCA, occluded left circumflex artery), HFpEF, hypertension,  smoker x 40+ years, currently vapes, COPD, colon cancer s/p resection 05/2022 , anemia who presents due to history of hypertension who presents for follow-up.  Previously seen to establish care due to CAD and HFpEF.  Lasix previously stopped due to low potassium.  Prior echo showed EF 45 to 50%.  Repeat echocardiogram obtained.  She stopped vaping, breathing is improved although she still endorses fatigue attributable to COPD, anemia.  No new cardiac concerns.  Prior notes Echocardiogram 01/2022 EF 45 to 50%. Left heart cath 04/10/2022- 50% LAD, 30% mid RCA, occluded left circumflex artery  Past Medical History:  Diagnosis Date   Anemia    Anxiety    Asthma    Cancer (HCC)    colon   CHF (congestive heart failure) (HCC)    COPD (chronic obstructive pulmonary disease) ([place])    Diabetes mellitus without complication ([place])    Hyperlipidemia    Hypertension    Stroke [facility])     Past Surgical History:  Procedure Laterality Date   COLONOSCOPY WITH PROPOFOL N/A 02/03/2022   Procedure: COLONOSCOPY WITH PROPOFOL;  Surgeon: Lin Landsman, MD;  Location: [facility] ENDOSCOPY;  Service: Gastroenterology;  Laterality: N/A;   ESOPHAGOGASTRODUODENOSCOPY (EGD) WITH PROPOFOL N/A 02/03/2022   Procedure: ESOPHAGOGASTRODUODENOSCOPY (EGD) WITH PROPOFOL;  Surgeon: Lin Landsman, MD;  Location: [facility] ENDOSCOPY;  Service: Gastroenterology;  Laterality: N/A;   GIVENS CAPSULE STUDY N/A 02/03/2022   Procedure:  GIVENS CAPSULE STUDY;  Surgeon: Lin Landsman, MD;  Location: [facility] ENDOSCOPY;  Service: Gastroenterology;  Laterality: N/A;  possible Capsule Study   IR IMAGING GUIDED PORT INSERTION  06/18/2022    Current Medications: Current Meds  Medication Sig   acetaminophen (TYLENOL) 325 MG tablet Take 650 mg by mouth every 6 (six) hours as needed.   albuterol (VENTOLIN HFA) 108 (90 Base) MCG/ACT inhaler Inhale into the lungs.   atorvastatin (LIPITOR) 40 MG tablet Take 40 mg by mouth daily.   fenofibrate 54 MG tablet Take 54 mg by mouth daily.   gabapentin (NEURONTIN) 600 MG tablet Take by mouth 2 (two) times daily.   insulin lispro (HUMALOG) 100 UNIT/ML injection Inject 1-10 Units into the skin 3 (three) times daily before meals. Per sliding scale   LANTUS SOLOSTAR 100 UNIT/ML Solostar Pen Inject 12 Units into the skin at bedtime.   lisinopril (ZESTRIL) 20 MG tablet Take 20 mg by mouth daily.   Magnesium Citrate 200 MG TABS Take 1 tablet by mouth in the morning and at bedtime. 250 twice daily   metoprolol succinate (TOPROL-XL) 50 MG 24 hr tablet Take 50 mg by mouth daily. Take with or immediately following a meal.   ranolazine (RANEXA) 500 MG 12 hr tablet Take 500 mg by mouth 2 (two) times daily.   sitaGLIPtin-metformin (JANUMET) 50-1000 MG tablet Take 1 tablet by mouth daily.   venlafaxine XR (EFFEXOR-XR) 75 MG 24 hr capsule Take 75  mg by mouth daily.     Allergies:   Codeine   Social History   Socioeconomic History   Marital status: Married    Spouse name: Not on file   Number of children: Not on file   Years of education: Not on file   Highest education level: Not on file  Occupational History   Not on file  Tobacco Use   Smoking status: Former    Types: E-cigarettes    Quit date: 12/08/2022    Years since quitting: 0.0   Smokeless tobacco: Not on file   Tobacco comments:    Quit Cigarettes 12/2021  Substance and Sexual Activity   Alcohol use: Yes    Comment: rare   Drug use:  Never   Sexual activity: Not on file  Other Topics Concern   Not on file  Social History Narrative   Not on file   Social Determinants of Health   Financial Resource Strain: Not on file  Food Insecurity: No Food Insecurity (10/11/2022)   Hunger Vital Sign    Worried About Running Out of Food in the Last Year: Never true    Ran Out of Food in the Last Year: Never true  Transportation Needs: No Transportation Needs (10/11/2022)   PRAPARE - Hydrologist (Medical): No    Lack of Transportation (Non-Medical): No  Physical Activity: Not on file  Stress: Not on file  Social Connections: Not on file     Family History: The patient's family history includes Colon cancer in her father; Diabetes in her mother; Heart attack in her father.  ROS:   Please see the history of present illness.     All other systems reviewed and are negative.  EKGs/Labs/Other Studies Reviewed:    The following studies were reviewed today:   EKG:  EKG not ordered today.    Recent Labs: 10/11/2022: B Natriuretic Peptide 118.0 12/22/2022: ALT 10; BUN 26; Creatinine, Ser 1.32; Hemoglobin 7.8; Magnesium 1.8; Platelets 94; Potassium 4.0; Sodium 137  Recent Lipid Panel    Component Value Date/Time   CHOL 108 01/30/2022 0524   TRIG 119 01/30/2022 0524   HDL 36 (L) 01/30/2022 0524   CHOLHDL 3.0 01/30/2022 0524   VLDL 24 01/30/2022 0524   LDLCALC 48 01/30/2022 0524     Risk Assessment/Calculations:        Physical Exam:    VS:  BP (!) 142/84 (BP Location: Right Arm, Patient Position: Sitting, Cuff Size: Normal)   Pulse 84   Ht 5\' 4"  (1.626 m)   Wt 136 lb 9.6 oz (62 kg)   SpO2 94%   BMI 23.45 kg/m     Wt Readings from Last 3 Encounters:  01/02/23 136 lb 9.6 oz (62 kg)  12/22/22 137 lb (62.1 kg)  12/19/22 138 lb 9.6 oz (62.9 kg)     GEN:  Well nourished, well developed in no acute distress HEENT: Normal NECK: No JVD; No carotid bruits CARDIAC: RRR, no murmurs,  rubs, gallops RESPIRATORY: Diminished breath sounds bilaterally, no wheezing ABDOMEN: Soft, non-tender, non-distended MUSCULOSKELETAL:  No edema; No deformity  SKIN: Warm and dry NEUROLOGIC:  Alert and oriented x 3 PSYCHIATRIC:  Normal affect   ASSESSMENT:    1. Chronic diastolic heart failure ([place])   2. Coronary artery disease involving native coronary artery of native heart, unspecified whether angina present   3. Primary hypertension     PLAN:    In order of problems listed above:  HFpEF, appears euvolemic.  Echo 1/24 EF 45 to 50%.  Unchanged from prior.  Previous history of hypokalemia.  Continue to hold Lasix. CAD, occluded left circumflex, 50% LAD, 30% RCA.  Denies chest pain.  Due to anemia, colon cancer, patient is not candidate for antiplatelets.  LDL at goal.  Continue Lipitor, fenofibrate. Hypertension, BP elevated, but reasonable.  Symptoms of fatigue, colon cancer, continue lisinopril, Toprol-XL.   Follow-up in 6 months..     Medication Adjustments/Labs and Tests Ordered: Current medicines are reviewed at length with the patient today.  Concerns regarding medicines are outlined above.  No orders of the defined types were placed in this encounter.  No orders of the defined types were placed in this encounter.   Patient Instructions  Medication Instructions:   Your physician recommends that you continue on your current medications as directed. Please refer to the Current Medication list given to you today.  *If you need a refill on your cardiac medications before your next appointment, please call your pharmacy*   Lab Work:  None Ordered  If you have labs (blood work) drawn today and your tests are completely normal, you will receive your results only by: [place] (if you have MyChart) OR A paper copy in the mail If you have any lab test that is abnormal or we need to change your treatment, we will call you to review the  results.   Testing/Procedures:  None Ordered   Follow-Up: At [facility], you and your health needs are our priority.  As part of our continuing mission to provide you with exceptional heart care, we have created designated Provider Care Teams.  These Care Teams include your primary Cardiologist (physician) and Advanced Practice Providers (APPs -  Physician Assistants and Nurse Practitioners) who all work together to provide you with the care you need, when you need it.  We recommend signing up for the patient portal called "MyChart".  Sign up information is provided on this After Visit Summary.  MyChart is used to connect with patients for Virtual Visits (Telemedicine).  Patients are able to view lab/test results, encounter notes, upcoming appointments, etc.  Non-urgent messages can be sent to your provider as well.   To learn more about what you can do with MyChart, go to [email or web address].    Your next appointment:   6 month(s)  Provider:   You may see Kate Sable, MD or one of the following Advanced Practice Providers on your designated Care Team:   Murray Hodgkins, NP Christell Faith, PA-C Cadence Kathlen Mody, PA-C Gerrie Nordmann, NP   Signed, Kate Sable, MD  01/02/2023 11:28 AM    [place]

## 2023-01-02 NOTE — Patient Instructions (Signed)

## 2023-01-19 ENCOUNTER — Encounter: Payer: Self-pay | Admitting: Oncology

## 2023-01-19 ENCOUNTER — Inpatient Hospital Stay: Payer: Medicare Other | Attending: Oncology

## 2023-01-19 ENCOUNTER — Inpatient Hospital Stay (HOSPITAL_BASED_OUTPATIENT_CLINIC_OR_DEPARTMENT_OTHER): Payer: Medicare Other | Admitting: Oncology

## 2023-01-19 VITALS — BP 165/81 | HR 77 | Temp 96.8°F | Resp 16 | Ht 64.0 in | Wt 139.0 lb

## 2023-01-19 DIAGNOSIS — I110 Hypertensive heart disease with heart failure: Secondary | ICD-10-CM | POA: Insufficient documentation

## 2023-01-19 DIAGNOSIS — E1140 Type 2 diabetes mellitus with diabetic neuropathy, unspecified: Secondary | ICD-10-CM | POA: Diagnosis not present

## 2023-01-19 DIAGNOSIS — E1165 Type 2 diabetes mellitus with hyperglycemia: Secondary | ICD-10-CM | POA: Insufficient documentation

## 2023-01-19 DIAGNOSIS — D649 Anemia, unspecified: Secondary | ICD-10-CM | POA: Diagnosis not present

## 2023-01-19 DIAGNOSIS — Z923 Personal history of irradiation: Secondary | ICD-10-CM | POA: Insufficient documentation

## 2023-01-19 DIAGNOSIS — C184 Malignant neoplasm of transverse colon: Secondary | ICD-10-CM

## 2023-01-19 DIAGNOSIS — C3432 Malignant neoplasm of lower lobe, left bronchus or lung: Secondary | ICD-10-CM | POA: Insufficient documentation

## 2023-01-19 DIAGNOSIS — I509 Heart failure, unspecified: Secondary | ICD-10-CM | POA: Insufficient documentation

## 2023-01-19 DIAGNOSIS — Z87891 Personal history of nicotine dependence: Secondary | ICD-10-CM | POA: Insufficient documentation

## 2023-01-19 DIAGNOSIS — D696 Thrombocytopenia, unspecified: Secondary | ICD-10-CM | POA: Diagnosis not present

## 2023-01-19 DIAGNOSIS — C189 Malignant neoplasm of colon, unspecified: Secondary | ICD-10-CM | POA: Insufficient documentation

## 2023-01-19 LAB — CBC WITH DIFFERENTIAL/PLATELET
Abs Immature Granulocytes: 0.03 10*3/uL (ref 0.00–0.07)
Basophils Absolute: 0 10*3/uL (ref 0.0–0.1)
Basophils Relative: 0 %
Eosinophils Absolute: 0 10*3/uL (ref 0.0–0.5)
Eosinophils Relative: 0 %
HCT: 32.3 % — ABNORMAL LOW (ref 36.0–46.0)
Hemoglobin: 10.2 g/dL — ABNORMAL LOW (ref 12.0–15.0)
Immature Granulocytes: 0 %
Lymphocytes Relative: 9 %
Lymphs Abs: 0.8 10*3/uL (ref 0.7–4.0)
MCH: 31.2 pg (ref 26.0–34.0)
MCHC: 31.6 g/dL (ref 30.0–36.0)
MCV: 98.8 fL (ref 80.0–100.0)
Monocytes Absolute: 0.8 10*3/uL (ref 0.1–1.0)
Monocytes Relative: 9 %
Neutro Abs: 7.5 10*3/uL (ref 1.7–7.7)
Neutrophils Relative %: 82 %
Platelets: 131 10*3/uL — ABNORMAL LOW (ref 150–400)
RBC: 3.27 MIL/uL — ABNORMAL LOW (ref 3.87–5.11)
RDW: 17.6 % — ABNORMAL HIGH (ref 11.5–15.5)
WBC: 9.2 10*3/uL (ref 4.0–10.5)
nRBC: 0 % (ref 0.0–0.2)

## 2023-01-19 LAB — COMPREHENSIVE METABOLIC PANEL
ALT: 9 U/L (ref 0–44)
AST: 14 U/L — ABNORMAL LOW (ref 15–41)
Albumin: 3.9 g/dL (ref 3.5–5.0)
Alkaline Phosphatase: 45 U/L (ref 38–126)
Anion gap: 10 (ref 5–15)
BUN: 22 mg/dL (ref 8–23)
CO2: 26 mmol/L (ref 22–32)
Calcium: 9.3 mg/dL (ref 8.9–10.3)
Chloride: 103 mmol/L (ref 98–111)
Creatinine, Ser: 0.86 mg/dL (ref 0.44–1.00)
GFR, Estimated: >60 mL/min (ref >60)
Glucose, Bld: 185 mg/dL — ABNORMAL HIGH (ref 70–99)
Potassium: 4.3 mmol/L (ref 3.5–5.1)
Sodium: 139 mmol/L (ref 135–145)
Total Bilirubin: 0.6 mg/dL (ref 0.3–1.2)
Total Protein: 7.3 g/dL (ref 6.5–8.1)

## 2023-01-19 LAB — MAGNESIUM: Magnesium: 1.7 mg/dL (ref 1.7–2.4)

## 2023-01-19 NOTE — Progress Notes (Signed)
Would like to know if she can go back on vitamin E

## 2023-01-19 NOTE — Progress Notes (Signed)
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  INTERVAL HISTORY: Patient returns to clinic today for repeat laboratory work and further evaluation 1 month after completing chemotherapy.  She currently feels well and is asymptomatic.  Her weakness and fatigue have significantly improved.  She continues to have a mild peripheral neuropathy, that does not affect her day-to-day activity.  She has no other neurologic complaints.  She denies any recent fevers.  She has no chest pain, shortness of breath, cough, or hemoptysis.  She has no abdominal pain.  She denies any nausea, vomiting, constipation, or diarrhea.  She has no melena or hematochezia.  She has no urinary complaints.  Patient offers no further specific complaints today.  REVIEW OF SYSTEMS:   Review of Systems  Constitutional: Negative.  Negative for fever, malaise/fatigue and weight loss.  Respiratory: Negative.  Negative for cough, hemoptysis and shortness of breath.   Cardiovascular: Negative.  Negative for chest pain and leg swelling.  Gastrointestinal: Negative.  Negative for abdominal pain, blood in stool, constipation, diarrhea, melena, nausea and vomiting.  Genitourinary: Negative.  Negative for dysuria.  Musculoskeletal: Negative.  Negative for back pain.  Skin: Negative.  Negative for rash.  Neurological: Negative.  Negative for dizziness, focal weakness, weakness and headaches.  Psychiatric/Behavioral: Negative.  The patient is not nervous/anxious.     As per HPI. Otherwise, a complete review of systems is  negative.  PAST MEDICAL HISTORY: Past Medical History:  Diagnosis Date   Anemia    Anxiety    Asthma    Cancer ([place])    colon   CHF (congestive heart failure) (HCC)    COPD (chronic obstructive pulmonary disease) ([place])    Diabetes mellitus without complication ([place])    Hyperlipidemia    Hypertension    Stroke ([place])     PAST SURGICAL HISTORY: Past Surgical History:  Procedure Laterality Date   COLONOSCOPY WITH PROPOFOL N/A 02/03/2022   Procedure: COLONOSCOPY WITH PROPOFOL;  Surgeon: Lin Landsman, MD;  Location: [facility] ENDOSCOPY;  Service: Gastroenterology;  Laterality: N/A;   ESOPHAGOGASTRODUODENOSCOPY (EGD) WITH PROPOFOL N/A 02/03/2022   Procedure: ESOPHAGOGASTRODUODENOSCOPY (EGD) WITH PROPOFOL;  Surgeon: Lin Landsman, MD;  Location: [facility] ENDOSCOPY;  Service: Gastroenterology;  Laterality: N/A;   GIVENS CAPSULE STUDY N/A 02/03/2022   Procedure: GIVENS CAPSULE STUDY;  Surgeon: Lin Landsman, MD;  Location: [facility] ENDOSCOPY;  Service: Gastroenterology;  Laterality: N/A;  possible Capsule Study   IR IMAGING GUIDED PORT INSERTION  06/18/2022    FAMILY HISTORY: Family History  Problem Relation Age of Onset   Diabetes Mother    Heart attack Father    Colon cancer Father     ADVANCED DIRECTIVES (Y/N):  N  HEALTH MAINTENANCE: Social History   Tobacco Use   Smoking status: Former    Types: E-cigarettes    Quit date: 12/08/2022    Years since quitting: 0.1   Tobacco comments:    Quit Cigarettes 12/2021  Substance Use Topics   Alcohol use: Yes    Comment: rare   Drug use: Never     Colonoscopy:  PAP:  Bone density:  Lipid  panel:  Allergies  Allergen Reactions   Codeine Rash    Happened in 1970s    Current Outpatient Medications  Medication Sig Dispense Refill   acetaminophen (TYLENOL) 325 MG tablet Take 650 mg by mouth every 6 (six) hours as needed.     albuterol (VENTOLIN HFA) 108 (90 Base) MCG/ACT inhaler Inhale into the lungs.     atorvastatin  (LIPITOR) 40 MG tablet Take 40 mg by mouth daily.     fenofibrate 54 MG tablet Take 54 mg by mouth daily.     gabapentin (NEURONTIN) 600 MG tablet Take by mouth 2 (two) times daily.     insulin lispro (HUMALOG) 100 UNIT/ML injection Inject 1-10 Units into the skin 3 (three) times daily before meals. Per sliding scale     LANTUS SOLOSTAR 100 UNIT/ML Solostar Pen Inject 12 Units into the skin at bedtime.     lisinopril (ZESTRIL) 20 MG tablet Take 20 mg by mouth daily.     Magnesium Citrate 200 MG TABS Take 1 tablet by mouth in the morning and at bedtime. 250 twice daily     metoprolol succinate (TOPROL-XL) 50 MG 24 hr tablet Take 50 mg by mouth daily. Take with or immediately following a meal.     ranolazine (RANEXA) 500 MG 12 hr tablet Take 500 mg by mouth 2 (two) times daily.     sitaGLIPtin-metformin (JANUMET) 50-1000 MG tablet Take 1 tablet by mouth daily.     venlafaxine XR (EFFEXOR-XR) 75 MG 24 hr capsule Take 75 mg by mouth daily.     pantoprazole (PROTONIX) 40 MG tablet Take 1 tablet (40 mg total) by mouth daily. (Patient not taking: Reported on 01/02/2023) 30 tablet 5   No current facility-administered medications for this visit.   Facility-Administered Medications Ordered in Other Visits  Medication Dose Route Frequency Provider Last Rate Last Admin   sodium chloride flush (NS) 0.9 % injection 10 mL  10 mL Intracatheter PRN Lloyd Huger, MD   10 mL at 10/09/22 1353    OBJECTIVE: Vitals:   01/19/23 1015  BP: (!) 165/81  Pulse: 77  Resp: 16  Temp: (!) 96.8 F (36 C)  SpO2: 97%      Body mass index is 23.86 kg/m.    ECOG FS:0 - Asymptomatic  General: Well-developed, well-nourished, no acute distress. Eyes: Pink conjunctiva, anicteric sclera. HEENT: Normocephalic, moist mucous membranes. Lungs: No audible wheezing or coughing. Heart: Regular rate and rhythm. Abdomen: Soft, nontender, no obvious distention. Musculoskeletal: No edema, cyanosis, or clubbing. Neuro:  Alert, answering all questions appropriately. Cranial nerves grossly intact. Skin: No rashes or petechiae noted. Psych: Normal affect.  LAB RESULTS:  Lab Results  Component Value Date   NA 139 01/19/2023   K 4.3 01/19/2023   CL 103 01/19/2023   CO2 26 01/19/2023   GLUCOSE 185 (H) 01/19/2023   BUN 22 01/19/2023   CREATININE 0.86 01/19/2023   CALCIUM 9.3 01/19/2023   PROT 7.3 01/19/2023   ALBUMIN 3.9 01/19/2023   AST 14 (L) 01/19/2023   ALT 9 01/19/2023   ALKPHOS 45 01/19/2023   BILITOT 0.6 01/19/2023   GFRNONAA >60 01/19/2023    Lab Results  Component Value Date   WBC 9.2 01/19/2023   NEUTROABS 7.5 01/19/2023   HGB 10.2 (L) 01/19/2023   HCT 32.3 (L) 01/19/2023   MCV 98.8 01/19/2023   PLT 131 (L) 01/19/2023     STUDIES: ECHOCARDIOGRAM COMPLETE  Result Date: 12/25/2022    ECHOCARDIOGRAM REPORT  Patient Name:   Jessica Stout Date of Exam: 12/25/2022 Medical Rec #:  [id number]   Height:       64.0 in Accession #:    [id number]  Weight:       137.0 lb Date of Birth:  31-Dec-1950   BSA:          1.666 m Patient Age:    36 years    BP:           134/78 mmHg Patient Gender: F           HR:           83 bpm. Exam Location:  [facility] Procedure: 2D Echo, 3D Echo, Cardiac Doppler, Color Doppler and Strain Analysis Indications:    Chronic diastolic heart failure (HCC) [I50.32 (ICD-10-CM)]  History:        Patient has prior history of Echocardiogram examinations, most                 recent 01/30/2022. CHF, COPD and Colon Cancer; Risk                 Factors:Dyslipidemia, Diabetes and Former Smoker.  Sonographer:    Wilkie Aye RVT RCS Referring Phys: [place]  1. Left ventricular ejection fraction, by estimation, is 45 to 50%. The left ventricle has mildly decreased function. The left ventricle demonstrates regional wall motion abnormalities (severe hypokinesis of the infero/posterior wall). Left ventricular diastolic parameters are consistent with Grade I diastolic  dysfunction (impaired relaxation).  2. Right ventricular systolic function is normal. The right ventricular size is normal. Tricuspid regurgitation signal is inadequate for assessing PA pressure.  3. Left atrial size was mildly dilated.  4. The mitral valve is normal in structure. Mild mitral valve regurgitation. No evidence of mitral stenosis.  5. The aortic valve was not well visualized. Aortic valve regurgitation is not visualized. No aortic stenosis is present.  6. The inferior vena cava is normal in size with greater than 50% respiratory variability, suggesting right atrial pressure of 3 mmHg. Comparison(s): EF 45-50%. FINDINGS  Left Ventricle: Left ventricular ejection fraction, by estimation, is 45 to 50%. The left ventricle has mildly decreased function. The left ventricle demonstrates regional wall motion abnormalities. The left ventricular internal cavity size was normal in size. There is no left ventricular hypertrophy. Left ventricular diastolic parameters are consistent with Grade I diastolic dysfunction (impaired relaxation). Right Ventricle: The right ventricular size is normal. No increase in right ventricular wall thickness. Right ventricular systolic function is normal. Tricuspid regurgitation signal is inadequate for assessing PA pressure. Left Atrium: Left atrial size was mildly dilated. Right Atrium: Right atrial size was normal in size. Pericardium: There is no evidence of pericardial effusion. Mitral Valve: The mitral valve is normal in structure. Mild mitral valve regurgitation. No evidence of mitral valve stenosis. Tricuspid Valve: The tricuspid valve is normal in structure. Tricuspid valve regurgitation is mild . No evidence of tricuspid stenosis. Aortic Valve: The aortic valve was not well visualized. Aortic valve regurgitation is not visualized. No aortic stenosis is present. Aortic valve mean gradient measures 3.0 mmHg. Aortic valve peak gradient measures 3.8 mmHg. Aortic valve area, by  VTI measures 3.19 cm. Pulmonic Valve: The pulmonic valve was normal in structure. Pulmonic valve regurgitation is not visualized. No evidence of pulmonic stenosis. Aorta: The aortic root is normal in size and structure. Venous: The inferior vena cava is normal in size with greater than 50% respiratory variability, suggesting right atrial  pressure of 3 mmHg. IAS/Shunts: No atrial level shunt detected by color flow Doppler.  LEFT VENTRICLE PLAX 2D LVIDd:         5.40 cm   Diastology LVIDs:         4.00 cm   LV e' medial:    4.19 cm/s LV PW:         0.70 cm   LV E/e' medial:  11.6 LV IVS:        1.30 cm   LV e' lateral:   8.84 cm/s LVOT diam:     2.00 cm   LV E/e' lateral: 5.5 LV SV:         61 LV SV Index:   36 LVOT Area:     3.14 cm                           3D Volume EF:                          3D EF:        52 %                          LV EDV:       116 ml                          LV ESV:       56 ml                          LV SV:        60 ml RIGHT VENTRICLE             IVC RV Basal diam:  3.60 cm     IVC diam: 1.40 cm RV Mid diam:    3.30 cm RV S prime:     13.20 cm/s TAPSE (M-mode): 2.0 cm LEFT ATRIUM             Index        RIGHT ATRIUM           Index LA diam:        4.50 cm 2.70 cm/m   RA Area:     12.60 cm LA Vol (A2C):   52.6 ml 31.58 ml/m  RA Volume:   25.20 ml  15.13 ml/m LA Vol (A4C):   44.9 ml 26.95 ml/m LA Biplane Vol: 49.8 ml 29.90 ml/m  AORTIC VALVE                    PULMONIC VALVE AV Area (Vmax):    2.93 cm     PV Vmax:       0.74 m/s AV Area (Vmean):   2.59 cm     PV Peak grad:  2.2 mmHg AV Area (VTI):     3.19 cm AV Vmax:           97.90 cm/s AV Vmean:          78.400 cm/s AV VTI:            0.190 m AV Peak Grad:      3.8 mmHg AV Mean Grad:      3.0 mmHg LVOT Vmax:         91.40 cm/s LVOT Vmean:  64.700 cm/s LVOT VTI:          0.193 m LVOT/AV VTI ratio: 1.02  AORTA Ao Root diam: 3.00 cm Ao Asc diam:  3.30 cm Ao Arch diam: 2.2 cm MITRAL VALVE MV Area (PHT): 3.16 cm     SHUNTS  MV Decel Time: 240 msec     Systemic VTI:  0.19 m MV E velocity: 48.70 cm/s   Systemic Diam: 2.00 cm MV A velocity: 104.00 cm/s MV E/A ratio:  0.47 Ida Rogue MD Electronically signed by Ida Rogue MD Signature Date/Time: 12/25/2022/5:18:00 PM    Final     ASSESSMENT: Stage IIIc colon cancer.  PLAN:    Stage IIIc colon cancer: Patient underwent complete surgical resection at [facility] on May 23, 2022.  Large colon mass was noted to partially invade stomach as well as surrounding mesentery.  PET scan did not reveal any metastatic disease other than a positive lung nodule which was confirmed to be a second primary.  Patient's preop CEA was 171, her most recent CEA was 18.6.  Today's result is pending.  Patient completed her final cycle of adjuvant FOLFOX on December 23, 2022.  No further intervention is needed.  Return to clinic in 2 months with repeat laboratory work, imaging, and further evaluation. 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  Patient completed XRT in May 2023.  CT scan results from September 01, 2022 reviewed independently with partial regression of nodule indicating treatment effect.  Repeat CT scan of chest in April 2024 along with abdomen and pelvis CT as above. Pain: Resolved. Anemia: Hemoglobin trending up and is now 10.2. Thrombocytopenia: Significantly improved.  Patient's platelet count is 131. Hyperglycemia: Chronic and unchanged.  Continue monitoring and treatment per primary care.  Patient expressed understanding and was in agreement with this plan. She also understands that She can call clinic at any time with any questions, concerns, or complaints.    Cancer Staging  Colon cancer [facility]) Staging form: Colon and Rectum, AJCC 8th Edition - Pathologic stage from 06/10/2022: Stage IIIC (pT4b, pN1c, cM0) - Signed by Lloyd Huger, MD on 06/10/2022 Stage prefix: Initial diagnosis Total positive  nodes: 0 Histologic grading system: 4 grade system Histologic grade (G): G3   Lloyd Huger, MD   01/19/2023 11:15 AM

## 2023-03-12 ENCOUNTER — Inpatient Hospital Stay: Payer: Medicare Other | Attending: Oncology

## 2023-03-12 ENCOUNTER — Ambulatory Visit
Admission: RE | Admit: 2023-03-12 | Discharge: 2023-03-12 | Disposition: A | Discharge status: Home / Self Care | Payer: Medicare Other | Source: Ambulatory Visit | Attending: Radiation Oncology | Admitting: Radiation Oncology

## 2023-03-12 DIAGNOSIS — E1140 Type 2 diabetes mellitus with diabetic neuropathy, unspecified: Secondary | ICD-10-CM | POA: Diagnosis not present

## 2023-03-12 DIAGNOSIS — C184 Malignant neoplasm of transverse colon: Secondary | ICD-10-CM | POA: Diagnosis present

## 2023-03-12 DIAGNOSIS — E1165 Type 2 diabetes mellitus with hyperglycemia: Secondary | ICD-10-CM | POA: Diagnosis not present

## 2023-03-12 DIAGNOSIS — D649 Anemia, unspecified: Secondary | ICD-10-CM | POA: Diagnosis not present

## 2023-03-12 DIAGNOSIS — Z923 Personal history of irradiation: Secondary | ICD-10-CM | POA: Insufficient documentation

## 2023-03-12 DIAGNOSIS — C3432 Malignant neoplasm of lower lobe, left bronchus or lung: Secondary | ICD-10-CM | POA: Diagnosis not present

## 2023-03-12 DIAGNOSIS — C189 Malignant neoplasm of colon, unspecified: Secondary | ICD-10-CM | POA: Diagnosis not present

## 2023-03-12 DIAGNOSIS — Z87891 Personal history of nicotine dependence: Secondary | ICD-10-CM | POA: Diagnosis not present

## 2023-03-12 DIAGNOSIS — Z800 Family history of malignant neoplasm of digestive organs: Secondary | ICD-10-CM | POA: Diagnosis not present

## 2023-03-12 LAB — CMP (CANCER CENTER ONLY)
ALT: 10 U/L (ref 0–44)
AST: 18 U/L (ref 15–41)
Albumin: 4 g/dL (ref 3.5–5.0)
Alkaline Phosphatase: 40 U/L (ref 38–126)
Anion gap: 8 (ref 5–15)
BUN: 35 mg/dL — ABNORMAL HIGH (ref 8–23)
CO2: 23 mmol/L (ref 22–32)
Calcium: 9.5 mg/dL (ref 8.9–10.3)
Chloride: 108 mmol/L (ref 98–111)
Creatinine: 0.93 mg/dL (ref 0.44–1.00)
GFR, Estimated: >60 mL/min (ref >60)
Glucose, Bld: 137 mg/dL — ABNORMAL HIGH (ref 70–99)
Potassium: 4.6 mmol/L (ref 3.5–5.1)
Sodium: 139 mmol/L (ref 135–145)
Total Bilirubin: 0.5 mg/dL (ref 0.3–1.2)
Total Protein: 7.4 g/dL (ref 6.5–8.1)

## 2023-03-12 LAB — CBC WITH DIFFERENTIAL/PLATELET
Abs Immature Granulocytes: 0.02 10*3/uL (ref 0.00–0.07)
Basophils Absolute: 0 10*3/uL (ref 0.0–0.1)
Basophils Relative: 1 %
Eosinophils Absolute: 0.1 10*3/uL (ref 0.0–0.5)
Eosinophils Relative: 2 %
HCT: 35.3 % — ABNORMAL LOW (ref 36.0–46.0)
Hemoglobin: 11.3 g/dL — ABNORMAL LOW (ref 12.0–15.0)
Immature Granulocytes: 0 %
Lymphocytes Relative: 17 %
Lymphs Abs: 1 10*3/uL (ref 0.7–4.0)
MCH: 30.1 pg (ref 26.0–34.0)
MCHC: 32 g/dL (ref 30.0–36.0)
MCV: 93.9 fL (ref 80.0–100.0)
Monocytes Absolute: 0.5 10*3/uL (ref 0.1–1.0)
Monocytes Relative: 9 %
Neutro Abs: 4.3 10*3/uL (ref 1.7–7.7)
Neutrophils Relative %: 71 %
Platelets: 195 10*3/uL (ref 150–400)
RBC: 3.76 MIL/uL — ABNORMAL LOW (ref 3.87–5.11)
RDW: 14.4 % (ref 11.5–15.5)
WBC: 6 10*3/uL (ref 4.0–10.5)
nRBC: 0 % (ref 0.0–0.2)

## 2023-03-12 LAB — MAGNESIUM: Magnesium: 1.8 mg/dL (ref 1.7–2.4)

## 2023-03-12 MED ORDER — HEPARIN SOD (PORK) LOCK FLUSH 100 UNIT/ML IV SOLN
500.0000 [IU] | Freq: Once | INTRAVENOUS | Status: AC
  Administered 2023-03-12: 500 [IU] via INTRAVENOUS

## 2023-03-12 MED ORDER — BARIUM SULFATE 2 % PO SUSP
900.0000 mL | Freq: Once | ORAL | Status: AC
  Administered 2023-03-12: 1 mL via ORAL

## 2023-03-12 MED ORDER — IOHEXOL 300 MG/ML  SOLN
100.0000 mL | Freq: Once | INTRAMUSCULAR | Status: AC | PRN
  Administered 2023-03-12: 100 mL via INTRAVENOUS

## 2023-03-13 LAB — CEA: CEA: 125 ng/mL — ABNORMAL HIGH (ref 0.0–4.7)

## 2023-03-16 ENCOUNTER — Other Ambulatory Visit: Payer: Self-pay | Admitting: Oncology

## 2023-03-17 ENCOUNTER — Inpatient Hospital Stay: Payer: Medicare Other | Admitting: Oncology

## 2023-03-17 ENCOUNTER — Telehealth: Payer: Self-pay

## 2023-03-17 NOTE — Telephone Encounter (Signed)
Follow up appointment scheduled for 4/18

## 2023-03-17 NOTE — Telephone Encounter (Signed)
Patient did not show up for follow up appointment today. LVM to call back to discuss when to come back. Will need PET scan based on CT results.

## 2023-03-19 ENCOUNTER — Other Ambulatory Visit: Payer: Medicare Other

## 2023-03-19 ENCOUNTER — Ambulatory Visit
Admission: RE | Admit: 2023-03-19 | Discharge: 2023-03-19 | Disposition: A | Discharge status: Home / Self Care | Payer: Medicare Other | Source: Ambulatory Visit | Attending: Radiation Oncology | Admitting: Radiation Oncology

## 2023-03-19 ENCOUNTER — Inpatient Hospital Stay (HOSPITAL_BASED_OUTPATIENT_CLINIC_OR_DEPARTMENT_OTHER): Payer: Medicare Other | Admitting: Oncology

## 2023-03-19 ENCOUNTER — Telehealth: Payer: Self-pay | Admitting: *Deleted

## 2023-03-19 ENCOUNTER — Encounter: Payer: Self-pay | Admitting: Radiation Oncology

## 2023-03-19 VITALS — BP 133/77 | HR 92 | Temp 96.6°F | Resp 16 | Ht 64.0 in | Wt 134.0 lb

## 2023-03-19 VITALS — BP 131/77 | HR 92 | Temp 96.6°F | Resp 16 | Ht 64.0 in | Wt 134.7 lb

## 2023-03-19 DIAGNOSIS — C3432 Malignant neoplasm of lower lobe, left bronchus or lung: Secondary | ICD-10-CM

## 2023-03-19 DIAGNOSIS — Z923 Personal history of irradiation: Secondary | ICD-10-CM | POA: Diagnosis not present

## 2023-03-19 DIAGNOSIS — Z9221 Personal history of antineoplastic chemotherapy: Secondary | ICD-10-CM | POA: Insufficient documentation

## 2023-03-19 DIAGNOSIS — R918 Other nonspecific abnormal finding of lung field: Secondary | ICD-10-CM | POA: Diagnosis not present

## 2023-03-19 DIAGNOSIS — C184 Malignant neoplasm of transverse colon: Secondary | ICD-10-CM | POA: Diagnosis not present

## 2023-03-19 NOTE — Progress Notes (Signed)
Radiation Oncology Follow up Note  Name: Mckay Brandt   Date:   03/19/2023 MRN:  [id number] DOB: Aug 21, 1951    This 72 y.o. female presents to the clinic today for 68-month follow-up status post SBRT for stage I non-small cell lung cancer of the left lower lobe.  REFERRING PROVIDER: Jaclyn Shaggy, MD  HPI: Patient is a 72 year old female now out 11 months having completed SBRT to her left lower lobe for stage I non-small cell lung cancer seen today in routine follow-up clinically she is doing well specifically [place] cough hemoptysis chest tightness or any change in her pulmonary status.  She had a recent CT scan of the chest.  Showing new scattered tiny pulmonary nodules measuring up to 2 mm nonspecific but favored to reflect infectious or inflammatory etiology.  Really no evidence of residual disease in the left lower lobe.  She is being followed by Dr. [name] for adjuvant chemotherapy which she is completed after her surgery for rectal cancer  COMPLICATIONS OF TREATMENT: none  FOLLOW UP COMPLIANCE: keeps appointments   PHYSICAL EXAM:  BP 131/77   Pulse 92   Temp (!) 96.6 F (35.9 C)   Resp 16   Ht  (1.626 m)   Wt 134 lb 11.2 oz (61.1 kg)   BMI 23.12 kg/m  Well-developed well-nourished patient in NAD. HEENT reveals PERLA, EOMI, discs not visualized.  Oral cavity is clear. No oral mucosal lesions are identified. Neck is clear without evidence of cervical or supraclavicular adenopathy. Lungs are clear to A&P. Cardiac examination is essentially unremarkable with regular rate and rhythm without murmur rub or thrill. Abdomen is benign with no organomegaly or masses noted. Motor sensory and DTR levels are equal and symmetric in the upper and lower extremities. Cranial nerves II through XII are grossly intact. Proprioception is intact. No peripheral adenopathy or edema is identified. No motor or sensory levels are noted. Crude visual fields are within normal range.  RADIOLOGY RESULTS: The  scans reviewed compatible with above-stated findings  PLAN: Present time from a pulmonary standpoint she is doing well and stable.  I have ordered a CT scan in 6 months and follow-up at that time.  Patient knows to call sooner with any concerns.  She continues close follow-up care for her colon cancer through medical oncology.  I would like to take this opportunity to thank you for allowing me to participate in the care of your patient.Carmina Miller, MD

## 2023-03-19 NOTE — Telephone Encounter (Signed)
Nurse placed call to patient to advise on plan of care after discussion at tumor board today. It was discussed and recommended by Dr. [name] to repeat lab work 1 week prior to CT scan scheduled for 5/30. Patient made aware of change to plan and need for updated appointments. Patient verbalized understanding and is in agreement with plan. Scheduling message sent to update lab visit to 5/23.

## 2023-03-19 NOTE — Progress Notes (Signed)
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  INTERVAL HISTORY: Patient returns to clinic today for further evaluation and discussion of her imaging and laboratory results.  She continues to have chronic weakness and fatigue.  She also has lower extremity weakness that she blames on a pre-existing neuropathy.  She admits to several falls.  She otherwise feels well.  She has no other neurologic complaints.  She denies any recent fevers or illnesses.  She has no chest pain, shortness of breath, cough, or hemoptysis.  She has no abdominal pain.  She denies any nausea, vomiting, constipation, or diarrhea.  She has no melena or hematochezia.  She has no urinary complaints.  Patient offers no further specific complaints today.  REVIEW OF SYSTEMS:   Review of Systems  Constitutional:  Positive for malaise/fatigue. Negative for fever and weight loss.  Respiratory: Negative.  Negative for cough, hemoptysis and shortness of breath.   Cardiovascular: Negative.  Negative for chest pain and leg swelling.  Gastrointestinal: Negative.  Negative for abdominal pain, blood in stool, constipation, diarrhea, melena, nausea and vomiting.  Genitourinary: Negative.  Negative for dysuria.  Musculoskeletal:  Positive for falls. Negative for back pain.  Skin: Negative.  Negative for rash.  Neurological:  Positive for sensory change and weakness. Negative for dizziness, focal weakness and headaches.  Psychiatric/Behavioral: Negative.  The patient is not nervous/anxious.     As per HPI. Otherwise, a  complete review of systems is negative.  PAST MEDICAL HISTORY: Past Medical History:  Diagnosis Date   Anemia    Anxiety    Asthma    Cancer    colon   CHF (congestive heart failure)    COPD (chronic obstructive pulmonary disease)    Diabetes mellitus without complication    Hyperlipidemia    Hypertension    Stroke     PAST SURGICAL HISTORY: Past Surgical History:  Procedure Laterality Date   COLONOSCOPY WITH PROPOFOL N/A 02/03/2022   Procedure: COLONOSCOPY WITH PROPOFOL;  Surgeon: Toney Reil, MD;  Location: [facility] ENDOSCOPY;  Service: Gastroenterology;  Laterality: N/A;   ESOPHAGOGASTRODUODENOSCOPY (EGD) WITH PROPOFOL N/A 02/03/2022   Procedure: ESOPHAGOGASTRODUODENOSCOPY (EGD) WITH PROPOFOL;  Surgeon: Toney Reil, MD;  Location: [facility] ENDOSCOPY;  Service: Gastroenterology;  Laterality: N/A;   GIVENS CAPSULE STUDY N/A 02/03/2022   Procedure: GIVENS CAPSULE STUDY;  Surgeon: Toney Reil, MD;  Location: [facility] ENDOSCOPY;  Service: Gastroenterology;  Laterality: N/A;  possible Capsule Study   IR IMAGING GUIDED PORT INSERTION  06/18/2022    FAMILY HISTORY: Family History  Problem Relation Age of Onset   Diabetes Mother    Heart attack Father    Colon cancer Father     ADVANCED DIRECTIVES (Y/N):  N  HEALTH MAINTENANCE: Social History   Tobacco Use   Smoking status: Former    Types: E-cigarettes    Quit date: 12/08/2022    Years since quitting: 0.2   Tobacco comments:    Quit Cigarettes 12/2021  Substance Use Topics   Alcohol use: Yes    Comment: rare   Drug use: Never     Colonoscopy:  PAP:  Bone density:  Lipid panel:  Allergies  Allergen Reactions   Codeine Rash    Happened in 1970s    Current Outpatient Medications  Medication Sig Dispense Refill   acetaminophen (TYLENOL) 325 MG tablet Take 650 mg by mouth every 6 (six) hours as needed.     albuterol (VENTOLIN HFA) 108 (90 Base) MCG/ACT inhaler Inhale into the lungs.     atorvastatin  (LIPITOR) 40 MG tablet Take 40 mg by mouth daily.     fenofibrate 54 MG tablet Take 54 mg by mouth daily.     gabapentin (NEURONTIN) 600 MG tablet Take by mouth 2 (two) times daily.     insulin lispro (HUMALOG) 100 UNIT/ML injection Inject 1-10 Units into the skin 3 (three) times daily before meals. Per sliding scale     LANTUS SOLOSTAR 100 UNIT/ML Solostar Pen Inject 12 Units into the skin at bedtime.     lisinopril (ZESTRIL) 20 MG tablet Take 20 mg by mouth daily.     Magnesium Citrate 200 MG TABS Take 1 tablet by mouth in the morning and at bedtime. 250 twice daily     metoprolol succinate (TOPROL-XL) 50 MG 24 hr tablet Take 50 mg by mouth daily. Take with or immediately following a meal.     pantoprazole (PROTONIX) 40 MG tablet Take 1 tablet (40 mg total) by mouth daily. (Patient not taking: Reported on 01/02/2023) 30 tablet 5   ranolazine (RANEXA) 500 MG 12 hr tablet Take 500 mg by mouth 2 (two) times daily.     sitaGLIPtin-metformin (JANUMET) 50-1000 MG tablet Take 1 tablet by mouth daily.     venlafaxine XR (EFFEXOR-XR) 75 MG 24 hr capsule Take 75 mg by mouth daily.     No current facility-administered medications for this visit.   Facility-Administered Medications Ordered in Other Visits  Medication Dose Route Frequency Provider Last Rate Last Admin   sodium chloride flush (NS) 0.9 % injection 10 mL  10 mL Intracatheter PRN Jeralyn Ruths, MD   10 mL at 10/09/22 1353    OBJECTIVE: Vitals:   03/19/23 0904  BP: 133/77  Pulse: 92  Resp: 16  Temp: (!) 96.6 F (35.9 C)      Body mass index is 23 kg/m.    ECOG FS:1 - Symptomatic but completely ambulatory  General: Well-developed, well-nourished, no acute distress. Eyes: Pink conjunctiva, anicteric sclera. HEENT: Normocephalic, moist mucous membranes. Lungs: No audible wheezing or coughing. Heart: Regular rate and rhythm. Abdomen: Soft, nontender, no obvious distention. Musculoskeletal: No edema, cyanosis, or  clubbing. Neuro: Alert, answering all questions appropriately. Cranial nerves grossly intact. Skin: No rashes or petechiae noted. Psych: Normal affect.  LAB RESULTS:  Lab Results  Component Value Date   NA 139 03/12/2023   K 4.6 03/12/2023   CL 108 03/12/2023   CO2 23 03/12/2023   GLUCOSE 137 (H) 03/12/2023   BUN 35 (H) 03/12/2023   CREATININE 0.93 03/12/2023   CALCIUM 9.5 03/12/2023   PROT 7.4 03/12/2023   ALBUMIN 4.0 03/12/2023   AST 18 03/12/2023   ALT 10 03/12/2023   ALKPHOS 40 03/12/2023   BILITOT 0.5 03/12/2023   GFRNONAA >60 03/12/2023    Lab Results  Component Value Date   WBC 6.0 03/12/2023   NEUTROABS 4.3 03/12/2023   HGB 11.3 (L) 03/12/2023   HCT 35.3 (L) 03/12/2023   MCV 93.9 03/12/2023   PLT 195 03/12/2023     STUDIES: CT CHEST ABDOMEN PELVIS W CONTRAST  Result Date: 03/15/2023  CLINICAL DATA:  History of colon cancer, follow-up. Also history of left lower lobe squamous cell carcinoma of the lung. Follow-up. * Tracking Code: BO *. EXAM: CT CHEST, ABDOMEN, AND PELVIS WITH CONTRAST TECHNIQUE: Multidetector CT imaging of the chest, abdomen and pelvis was performed following the standard protocol during bolus administration of intravenous contrast. RADIATION DOSE REDUCTION: This exam was performed according to the departmental dose-optimization program which includes automated exposure control, adjustment of the mA and/or kV according to patient size and/or use of iterative reconstruction technique. CONTRAST:  OMNIPAQUE IOHEXOL 300 MG/ML  SOLN COMPARISON:  Multiple priors including CT chest abdomen pelvis October 11, 2022 and PET-CT February 27, 2022 FINDINGS: CT CHEST FINDINGS Cardiovascular: Accessed right chest Port-A-Cath with tip in the right atrium. Aortic atherosclerosis. Normal caliber thoracic aorta. No central pulmonary embolus on this nondedicated study. Normal size heart. Coronary artery calcifications. No significant pericardial effusion/thickening  Mediastinum/Nodes: No suspicious thyroid nodule. No pathologically enlarged mediastinal, hilar or axillary lymph nodes. The esophagus is grossly unremarkable. Lungs/Pleura: Mild diffuse bronchial wall thickening. Scattered atelectasis/scarring. Emphysema. Stable 4 mm left upper lobe pulmonary nodule on image 42/3. Stable subpleural nodularity measuring 4 mm in the left upper lobe on image 42/3. New scattered tiny pulmonary nodules for instance in the right upper lobe apex there is a 2 mm nodule on image 26/3 and a 2 mm nodule on image 30/3 and in the left upper lobe there is a 2 mm nodule on image 39/3. No pleural effusion.  No pneumothorax. Musculoskeletal: No aggressive lytic or blastic lesion of bone. CT ABDOMEN PELVIS FINDINGS Hepatobiliary: Stable hypodense 6 mm lesion in the inferior right lobe of the liver on image 66/2 likely reflecting a cyst. No solid enhancing hepatic lesion. Cholelithiasis without findings of acute cholecystitis. No biliary ductal dilation. Pancreas: No pancreatic ductal dilation or evidence of acute inflammation. Spleen: No splenomegaly or focal splenic lesion. Adrenals/Urinary Tract: Stable macroscopic fat containing left adrenal mass measuring 3.3 cm on image 59/2 compatible with a myelolipoma. Similar right adrenal thickening favor hyperplasia. No hydronephrosis. Kidneys demonstrate symmetric enhancement. Right upper pole renal cyst. Urinary bladder is unremarkable for degree of distension. Stomach/Bowel: Radiopaque enteric contrast material traverses distal loops of small bowel. Similar appearance of the colonic and small bowel anastomotic sutures with associated focal a ganglionic distension. No evidence of bowel obstruction or acute bowel inflammation. Vascular/Lymphatic: Aortic atherosclerosis. Smooth IVC contours. No pathologically enlarged abdominal or pelvic lymph nodes. Reproductive: Uterus and bilateral adnexa are unremarkable. Other: No significant abdominopelvic free  fluid. Similar postsurgical change in the anterior abdominal wall. Musculoskeletal: No aggressive lytic or blastic lesion of bone. Multilevel degenerative changes spine. IMPRESSION: 1. New scattered tiny pulmonary nodules measuring up to 2 mm are nonspecific but favored to reflect an infectious or inflammatory etiology. However, while not favored metastatic disease is not entirely excluded and these warrant attention on short-term interval follow-up dedicated chest CT. 2. Stable postsurgical change without findings of local recurrence. 3. No convincing evidence of metastatic disease in the abdomen or pelvis. 4. Cholelithiasis without findings of acute cholecystitis. 5. 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 6.  Aortic Atherosclerosis (ICD10-I70.0). Electronically Signed   By: Maudry Mayhew M.D.   On: 03/15/2023 12:06    ASSESSMENT: Stage IIIc colon cancer.  PLAN:    Stage IIIc colon cancer: Patient underwent complete surgical resection at [facility] on May 23, 2022.  Large colon mass was noted to partially invade stomach as well as  surrounding mesentery.  PET scan did not reveal any metastatic disease other than a positive lung nodule which was confirmed to be a second primary.  Patient completed her final cycle of adjuvant FOLFOX on December 23, 2022.  Patient's preop CEA was 171.  Recently, CEA has increased from 18.6 to 125.0.  CT scan results from March 15, 2023 reviewed independently and reported as above with new 2 mm nonspecific pulmonary nodules.  Unclear etiology, but with the increase in her CEA is mildly concerning.  These lesions are too small to biopsy for evaluation by PET scan.  Plan to repeat CT and laboratory work in 6 weeks with follow-up 1 to 2 days later.  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  Patient completed XRT in May 2023.  Repeat CT scan as above.   Pain: Resolved. Anemia:  Hemoglobin continues to trend up and is now 11.3.   Thrombocytopenia: Resolved. Hyperglycemia: Patient has blood glucose control. Neuropathy/falls: Patient was given a referral to Occupational Therapy.  Patient expressed understanding and was in agreement with this plan. She also understands that She can call clinic at any time with any questions, concerns, or complaints.    Cancer Staging  Colon cancer Staging form: Colon and Rectum, AJCC 8th Edition - Pathologic stage from 06/10/2022: Stage IIIC (pT4b, pN1c, cM0) - Signed by Jeralyn Ruths, MD on 06/10/2022 Stage prefix: Initial diagnosis Total positive nodes: 0 Histologic grading system: 4 grade system Histologic grade (G): G3   Jeralyn Ruths, MD   03/19/2023 10:15 AM

## 2023-03-24 ENCOUNTER — Ambulatory Visit: Payer: Medicare Other | Admitting: Oncology

## 2023-03-25 ENCOUNTER — Inpatient Hospital Stay: Payer: Medicare Other | Admitting: Occupational Therapy

## 2023-04-01 ENCOUNTER — Inpatient Hospital Stay: Payer: Medicare Other | Attending: Oncology | Admitting: Occupational Therapy

## 2023-04-01 DIAGNOSIS — R296 Repeated falls: Secondary | ICD-10-CM

## 2023-04-01 DIAGNOSIS — C189 Malignant neoplasm of colon, unspecified: Secondary | ICD-10-CM | POA: Insufficient documentation

## 2023-04-01 DIAGNOSIS — D649 Anemia, unspecified: Secondary | ICD-10-CM | POA: Insufficient documentation

## 2023-04-01 DIAGNOSIS — C3432 Malignant neoplasm of lower lobe, left bronchus or lung: Secondary | ICD-10-CM | POA: Insufficient documentation

## 2023-04-01 DIAGNOSIS — Z87891 Personal history of nicotine dependence: Secondary | ICD-10-CM | POA: Insufficient documentation

## 2023-04-01 NOTE — Therapy (Signed)
[facility] [address] [place], [place], [address] Phone: [phone]   Fax:  [phone]  Occupational Therapy Screen  Patient Details  Name: Jessica Stout MRN: [id number] Date of Birth: 1951/02/20 No data recorded  Encounter Date: 04/01/2023   OT End of Session - 04/01/23 1334     Visit Number 0             Past Medical History:  Diagnosis Date   Anemia    Anxiety    Asthma    Cancer (HCC)    colon   CHF (congestive heart failure) (HCC)    COPD (chronic obstructive pulmonary disease) (HCC)    Diabetes mellitus without complication (HCC)    Hyperlipidemia    Hypertension    Stroke [facility])     Past Surgical History:  Procedure Laterality Date   COLONOSCOPY WITH PROPOFOL N/A 02/03/2022   Procedure: COLONOSCOPY WITH PROPOFOL;  Surgeon: Toney Reil, MD;  Location: [facility] ENDOSCOPY;  Service: Gastroenterology;  Laterality: N/A;   ESOPHAGOGASTRODUODENOSCOPY (EGD) WITH PROPOFOL N/A 02/03/2022   Procedure: ESOPHAGOGASTRODUODENOSCOPY (EGD) WITH PROPOFOL;  Surgeon: Toney Reil, MD;  Location: [facility] ENDOSCOPY;  Service: Gastroenterology;  Laterality: N/A;   GIVENS CAPSULE STUDY N/A 02/03/2022   Procedure: GIVENS CAPSULE STUDY;  Surgeon: Toney Reil, MD;  Location: [facility] ENDOSCOPY;  Service: Gastroenterology;  Laterality: N/A;  possible Capsule Study   IR IMAGING GUIDED PORT INSERTION  06/18/2022    There were no vitals filed for this visit.   Subjective Assessment - 04/01/23 1333     Subjective  I am not as active as I used to.  This leg muscles is weak I need to hands to push up.  And then a neuropathy in my feet and hands.  Got worse since chemo.  I did had 2 falls in the last 2 weeks.  My toe will get stuck at times.    Currently in Pain? No/denies              DR [name] 03/19/23: ASSESSMENT: Stage IIIc colon cancer.   PLAN:     Stage IIIc colon cancer: Patient underwent complete  surgical resection at [facility] on May 23, 2022.  Large colon mass was noted to partially invade stomach as well as surrounding mesentery.  PET scan did not reveal any metastatic disease other than a positive lung nodule which was confirmed to be a second primary.  Patient completed her final cycle of adjuvant FOLFOX on December 23, 2022.  Patient's preop CEA was 171.  Recently, CEA has increased from 18.6 to 125.0.  CT scan results from March 15, 2023 reviewed independently and reported as above with new 2 mm nonspecific pulmonary nodules.  Unclear etiology, but with the increase in her CEA is mildly concerning.  These lesions are too small to biopsy for evaluation by PET scan.  Plan to repeat CT and laboratory work in 6 weeks with follow-up 1 to 2 days later.  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  Patient completed XRT in May 2023.  Repeat CT scan as above.   Pain: Resolved. Anemia: Hemoglobin continues to trend up and is now 11.3.   Thrombocytopenia: Resolved. Hyperglycemia: Patient has blood glucose control. Neuropathy/falls: Patient was given a referral to Occupational Therapy.    OT SCREEN 04/01/23: Patient arrived with husband ambulating without assistive device.  Patient report  having 2 falls in the last 2 weeks.  3 steps to get into the house.  1 level house.  Walk-in shower.  Do of a cane or walker that she sometimes uses. Report increased neuropathy in the feet and fingers since chemo. Strength in the left leg better than right. Decreased dorsiflexing and left ankle. Decreased hip flexion and knee extension on the right.  BERG balance test done -patient scored 37/56 putting patient in medium risk for falling (range 21-40) Patient is hands both to sit to stand and lower self patient is supervision for standing close eyes, needs hands for transfers , needed supervision when turning 360 And unable to stand on 1  foot or tandem foot as well as alternate feet touching still or stair.  HEP: Provided patient of information about care program patient tired for doctors appointments. Wants to have a home exercise program to try at home. 4 times a day 67 minutes -Sit and stand 6 7 reps hands or no hands -Sidestepping at the kitchen counter 1 to 2 minutes -Holding onto kitchen counter standing on 1 leg tapping other leg forward sideways and back 1 to 2 minutes alternating legs -Calf stretches -Followed by dorsi and plantarflexion and internal and external rotation of foot tapping 10 reps each -Patient also to walk 2 3 4  minutes when going to the bathroom and not just going to sit down immediately.  Patient to follow-up in 3 weeks to reassess Berg balance test                                            Visit Diagnosis: Repeated falls    Problem List Patient Active Problem List   Diagnosis Date Noted   AKI (acute kidney injury) (HCC) 10/11/2022   Pressure injury of buttock, unstageable (HCC) 10/11/2022   Chronic systolic CHF (congestive heart failure) (HCC) 10/11/2022   Acute metabolic encephalopathy 10/11/2022   Lung cancer (HCC) 10/11/2022   Nonspecific abnormal results of pulmonary system function study 03/19/2022   Adjustment disorder with depressed mood 03/19/2022   Arthritis 03/19/2022   Colon cancer (HCC) 02/04/2022   Duodenal ulcer without hemorrhage or perforation and without obstruction 02/03/2022   Erosive gastropathy 02/03/2022   Colonic mass 02/03/2022   Hypokalemia 02/01/2022   Diabetes mellitus type 2, controlled, without complications (HCC) 01/29/2022   Chronic obstructive pulmonary disease (HCC) 01/29/2022   New onset of HFrEF 01/29/2022   Hyperlipidemia 01/29/2022   Anxiety and depression 01/29/2022   Iron deficiency anemia 01/29/2022   Acute respiratory failure with hypoxia (HCC)     Oletta Cohn, OTR/L,CLT 04/01/2023, 1:35  PM  [place] [facility] [address], [address] [place], [place], [address] Phone: [phone]   Fax:  [phone]  Name: Jessica Stout MRN: [id number] Date of Birth: February 28, 1951

## 2023-04-21 IMAGING — DX DG CHEST 1V PORT
1 series · 1 of 1 positions shown · non-contrast
Comparison: 01/31/2022

CLINICAL DATA: 71-year-old female with post lung biopsy

EXAM:
PORTABLE CHEST 1 VIEW

[chest ap]
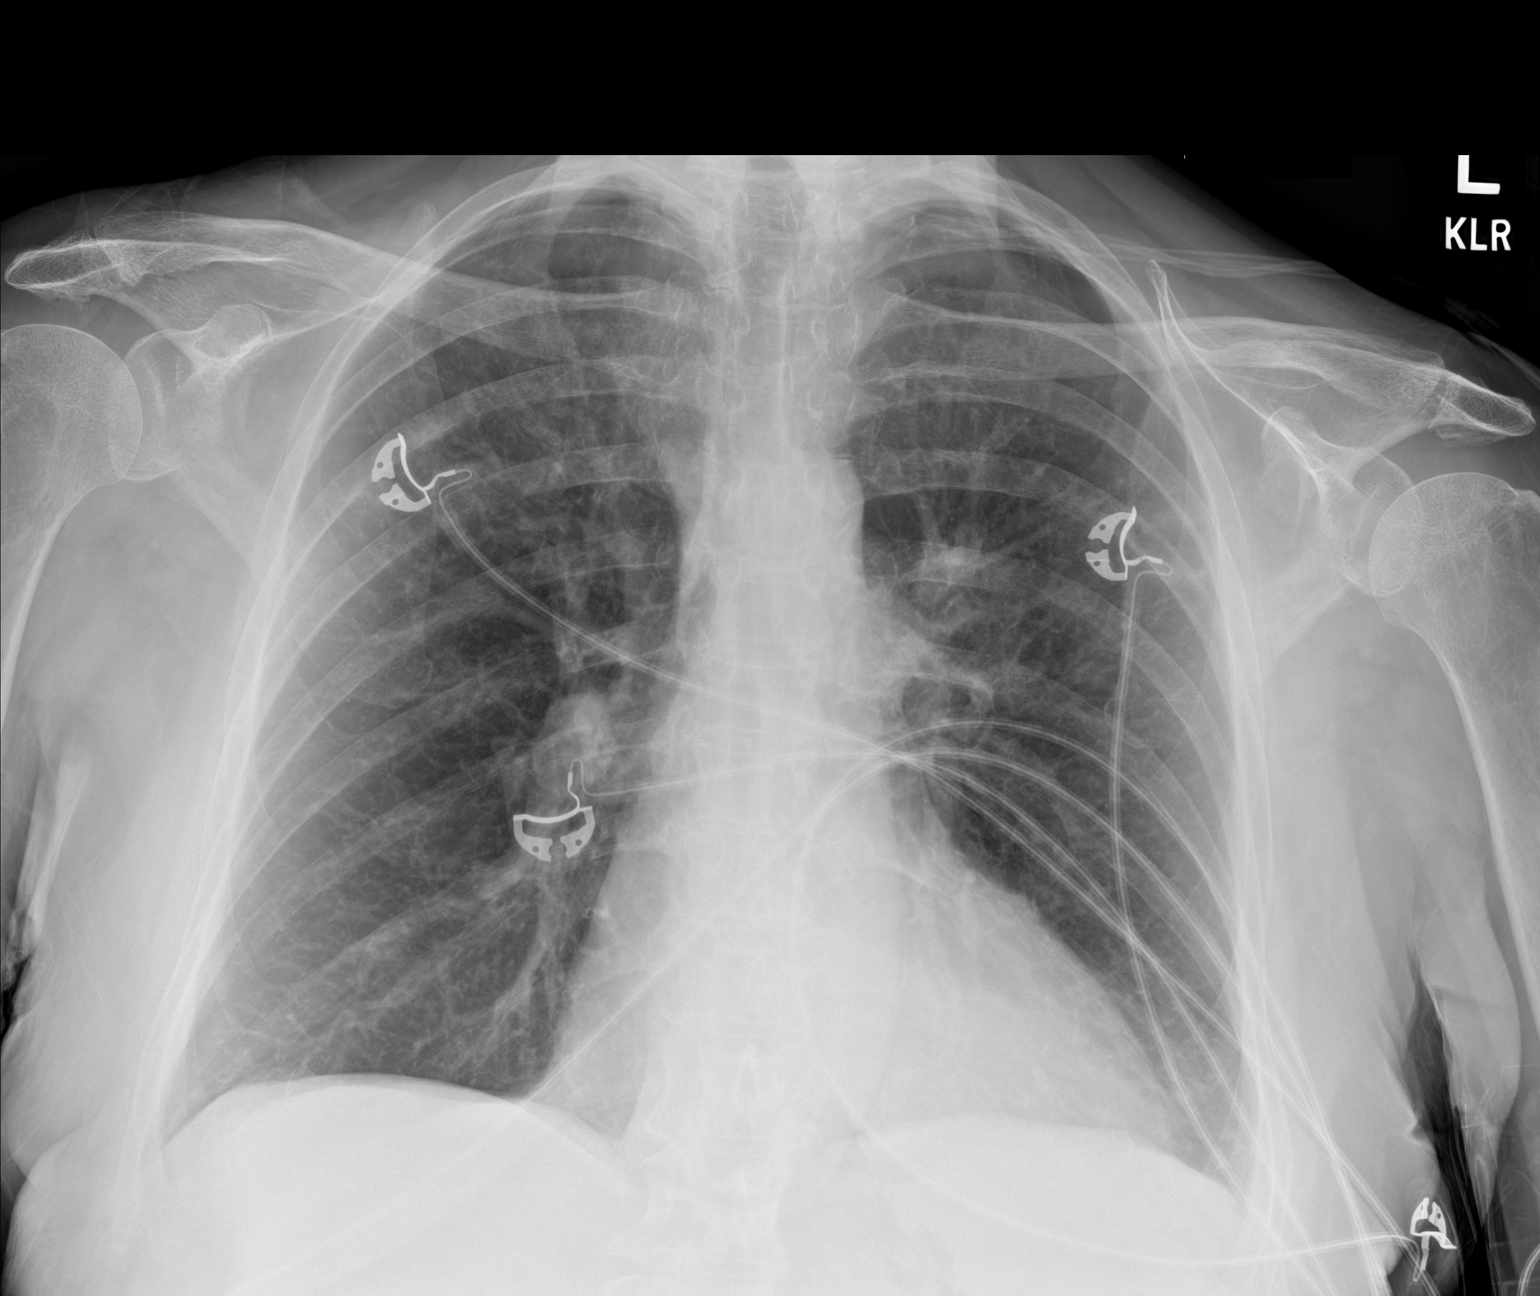

[1 of 1 positions shown; findings below may reference images not displayed]

FINDINGS: Cardiomediastinal silhouette unchanged in size and contour. No
evidence of central vascular congestion. No interlobular septal
thickening.

Small left apical pneumothorax.

Coarsened interstitial markings.  No confluent airspace disease.

No acute displaced fracture. Degenerative changes of the spine.
IMPRESSION: Small left apical pneumothorax status post biopsy

## 2023-04-21 IMAGING — DX DG CHEST 1V PORT
1 series · 1 of 1 positions shown · non-contrast
Comparison: Chest XR, earlier same day. CT chest, 02/03/2022.
PET-CT, 02/27/2022.

CLINICAL DATA: S/p LEFT lung biopsy. Pneumothorax, evaluate change.

EXAM:
PORTABLE CHEST 1 VIEW

[chest ap]
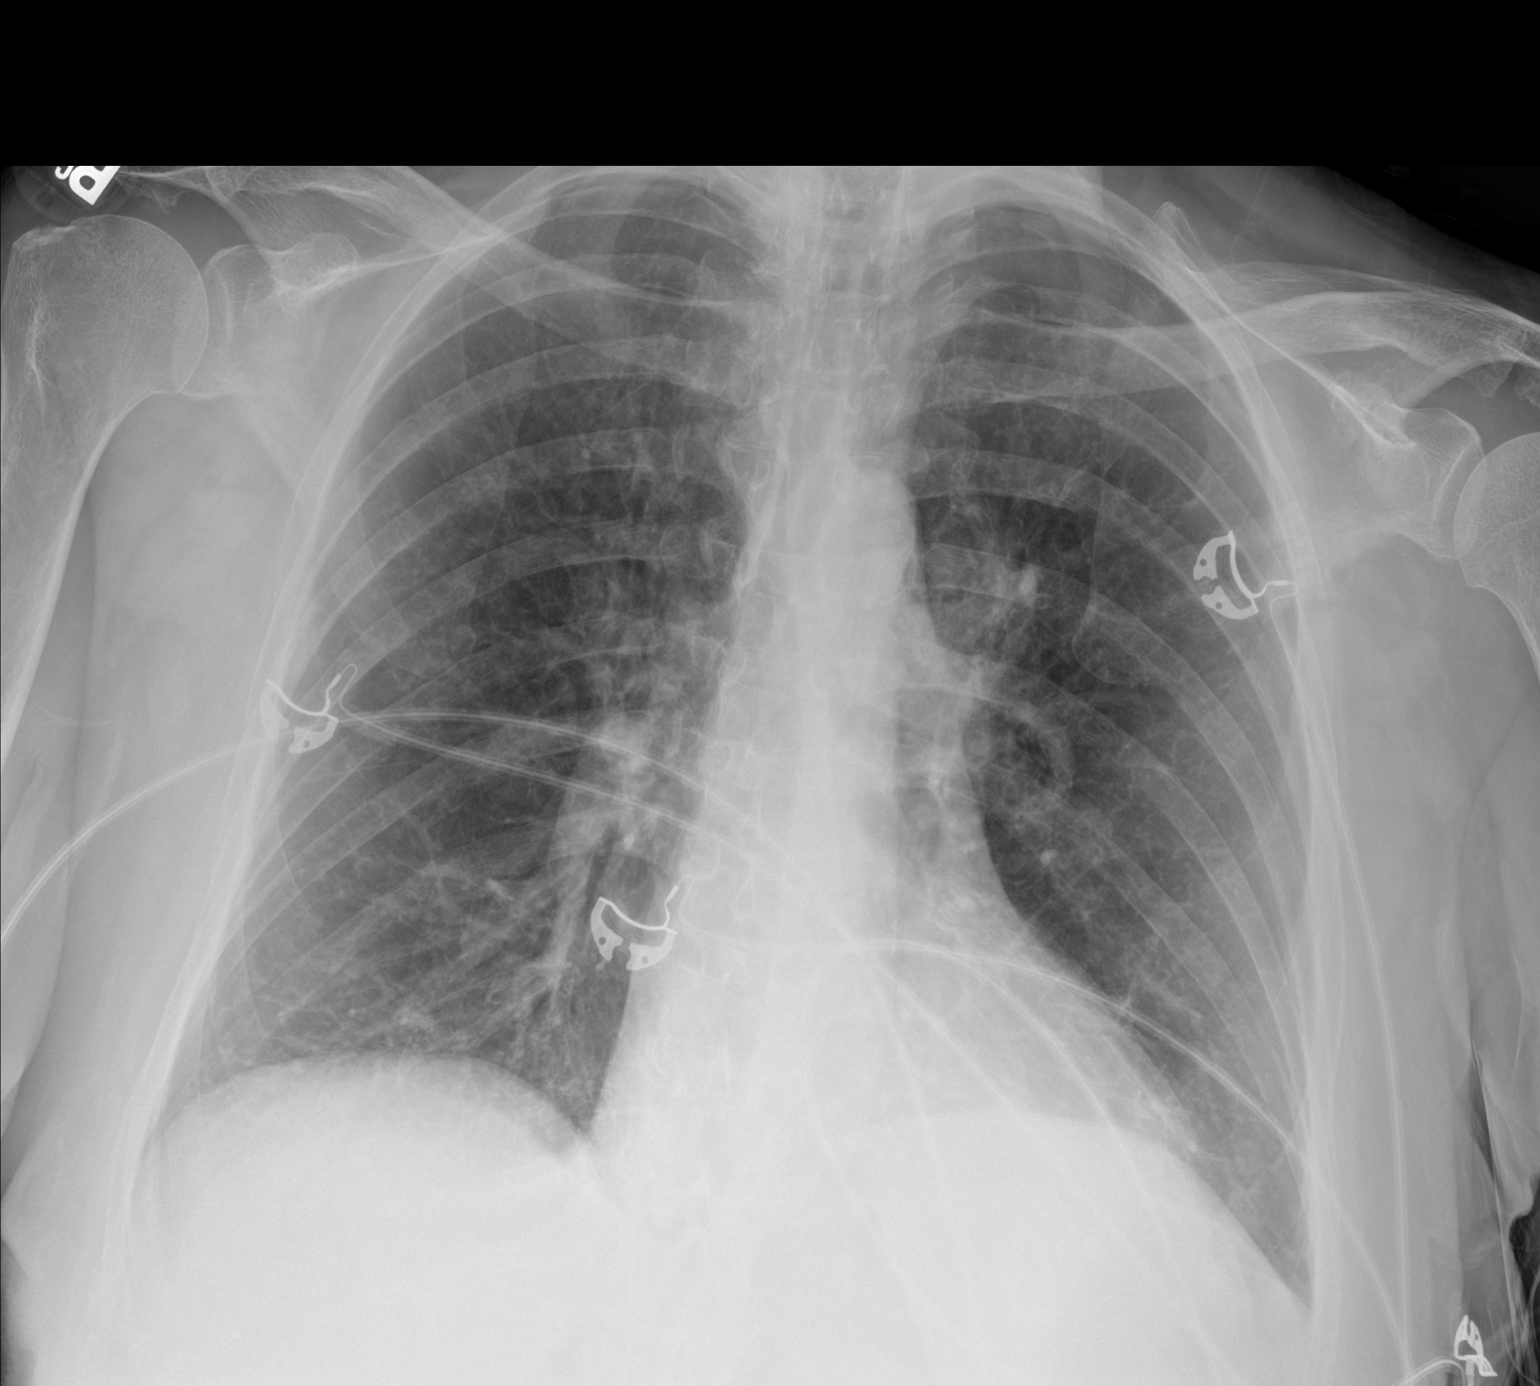

[1 of 1 positions shown; findings below may reference images not displayed]

FINDINGS: Cardiomediastinal silhouette is within normal limits. Lungs are well
inflated. Known LEFT lung nodule is not radiographically apparent.
Small volume LEFT apical pneumothorax, relatively slightly decreased
in size per recent comparison. No pleural effusion. No interval
osseous abnormality.
IMPRESSION: Small volume LEFT apical pneumothorax, stable versus relatively
decreased in size per comparison.

## 2023-04-21 IMAGING — CT CT BIOPSY
2 of 4 series · 6 of 14 positions shown, 7 images · non-contrast
Comparison: CT chest, 02/03/2022.

INDICATION: Patient with colon cancer and hypermetabolic LEFT lung nodule on
PET-CT.

[Series 3: i-sequence 4.8 br38 · axial · 0.73mm/px · z∈[+1466,+1474]mm · 3 of 69 slices shown]
[im 18/69  bone]
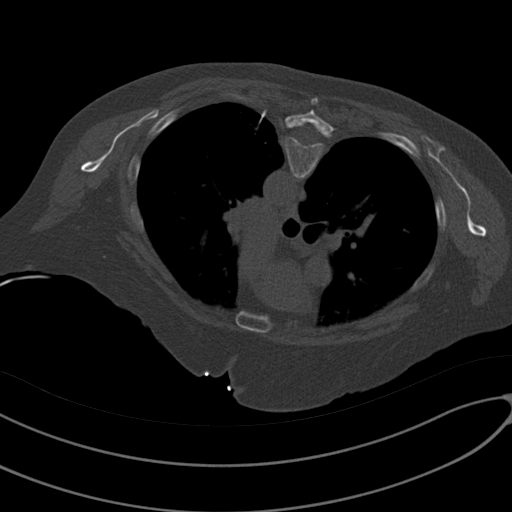
[im 35/69  bone]
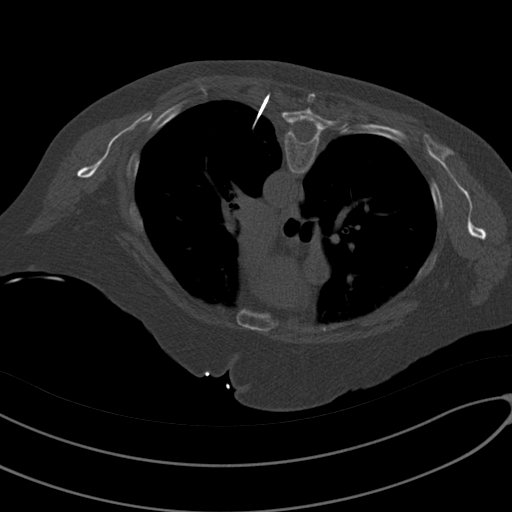
[im 52/69  bone]
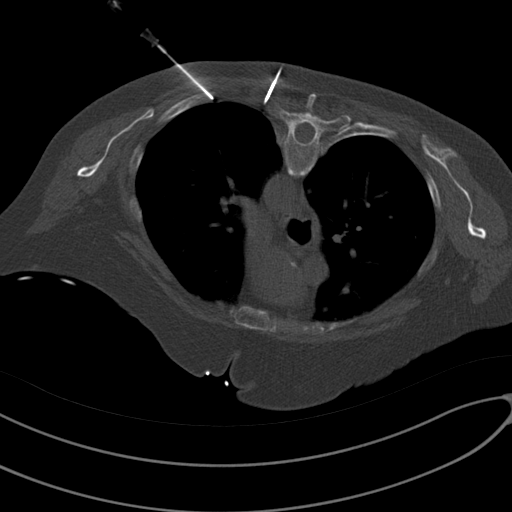

[Series 4: i-spiral 5.0 br38 · axial · 0.73mm/px · z∈[+1346,+1486]mm · 3 of 57 slices shown, 4 images]
[im 15/57  soft-tissue]
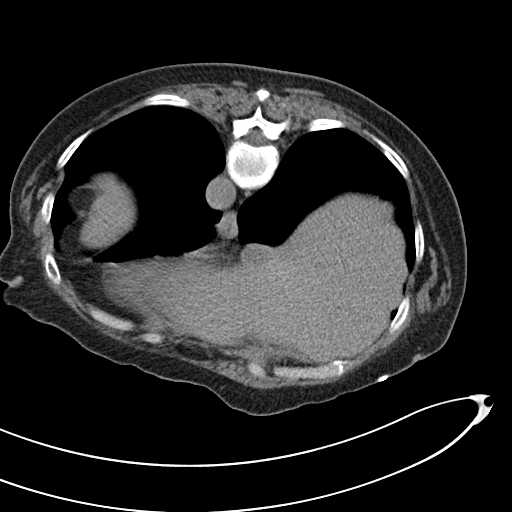
[im 15/57  bone]
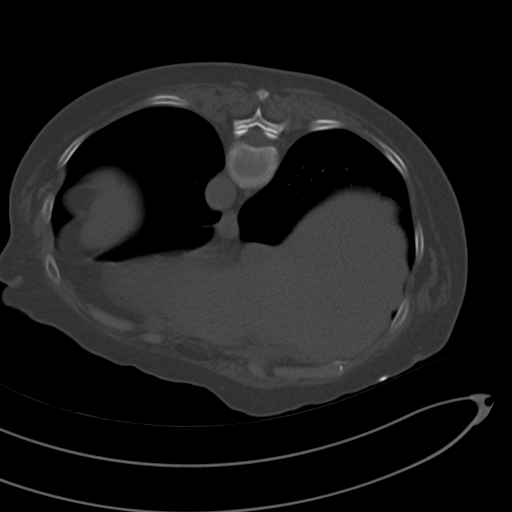
[im 29/57  bone]
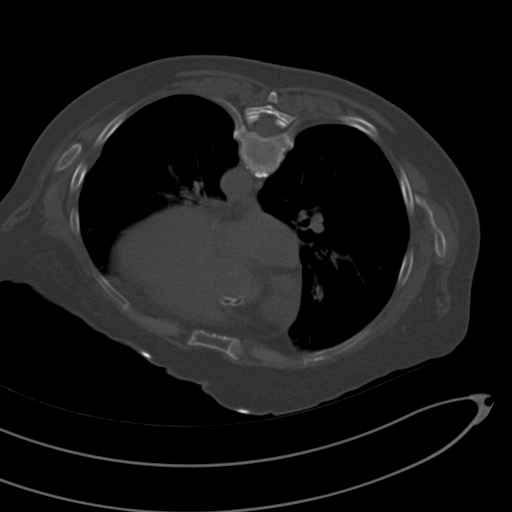
[im 43/57  bone]
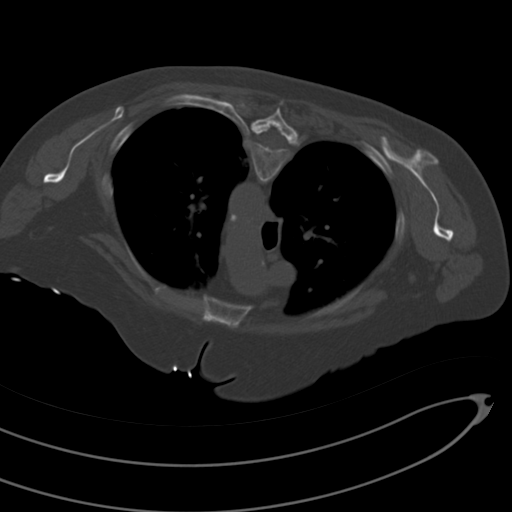

[6 of 14 positions shown; findings below may reference images not displayed]

EXAM:
CT-GUIDED LEFT LOWER LOBE LUNG NODULE BIOPSY

RADIATION DOSE REDUCTION: This exam was performed according to the
departmental dose-optimization program which includes automated
exposure control, adjustment of the mA and/or kV according to
patient size and/or use of iterative reconstruction technique.
PET-CT, 02/27/2022.

MEDICATIONS:
None.

ANESTHESIA/SEDATION:
Moderate sedation was employed during this procedure. A total of
Versed 2 mg Fentanyl 100 mcg was administered intravenously.
The patient's level of consciousness and vital signs were monitored
continuously by radiology nursing throughout the procedure under my
direct supervision.

Sedation time: 45 minutes

CONTRAST:  None

COMPLICATIONS:
SIR Level A - No therapy, no consequence.

A small postprocedural pneumothorax was present.

PROCEDURE:
Informed consent was obtained from the patient following an
explanation of the procedure, risks, benefits and alternatives. The
patient understands,agrees and consents for the procedure. All
questions were addressed. A time out was performed prior to the
initiation of the procedure.

The patient was positioned prone on the CT table and a limited chest
CT was performed for procedural planning demonstrating superior
segment of LEFT lower lobe lung nodule. The operative site was
prepped and draped in the usual sterile fashion. Under sterile
conditions and local anesthesia, a 17 gauge coaxial needle was
advanced into the peripheral aspect of the nodule using a direct
approach. The biopsy trajectory was unfavorable, therefore a new
lateral approach was preferred and a new 17 gauge coaxial needle was
advanced into the peripheral aspect of the nodule.

Positioning was confirmed with intermittent CT fluoroscopy and
followed by the acquisition of 4 cores with an 18 gauge core needle
biopsy device. The coaxial needle was removed after injection of a
autologous hemostatic patch and superficial hemostasis was achieved
with manual compression.

Limited post procedural chest CT demonstrated a small volume
pneumothorax. The patient was asymptomatic. Intermediate chest XR
was requested. A dressing was placed. The patient tolerated the
procedure well without immediate postprocedural complication. The
patient was escorted to have an upright chest radiograph.
IMPRESSION: 1. Successful CT guided core needle biopsy of a LEFT lung nodule, as
above.
2. A small volume of postprocedure pneumothorax was present. An
immediate chest XR was requested, within additional follow-up in 1
hour. The patient remained asymptomatic.

## 2023-04-22 ENCOUNTER — Inpatient Hospital Stay: Payer: Medicare Other | Admitting: Occupational Therapy

## 2023-04-23 ENCOUNTER — Inpatient Hospital Stay: Payer: Medicare Other

## 2023-04-30 ENCOUNTER — Ambulatory Visit
Admission: RE | Admit: 2023-04-30 | Discharge: 2023-04-30 | Disposition: A | Discharge status: Home / Self Care | Payer: Medicare Other | Source: Ambulatory Visit | Attending: Oncology | Admitting: Oncology

## 2023-04-30 ENCOUNTER — Other Ambulatory Visit: Payer: Medicare Other

## 2023-04-30 ENCOUNTER — Inpatient Hospital Stay: Payer: Medicare Other

## 2023-04-30 DIAGNOSIS — C184 Malignant neoplasm of transverse colon: Secondary | ICD-10-CM

## 2023-04-30 DIAGNOSIS — C3432 Malignant neoplasm of lower lobe, left bronchus or lung: Secondary | ICD-10-CM | POA: Diagnosis not present

## 2023-04-30 DIAGNOSIS — D649 Anemia, unspecified: Secondary | ICD-10-CM | POA: Diagnosis not present

## 2023-04-30 DIAGNOSIS — Z87891 Personal history of nicotine dependence: Secondary | ICD-10-CM | POA: Diagnosis not present

## 2023-04-30 DIAGNOSIS — C189 Malignant neoplasm of colon, unspecified: Secondary | ICD-10-CM | POA: Diagnosis present

## 2023-04-30 LAB — CBC WITH DIFFERENTIAL/PLATELET
Abs Immature Granulocytes: 0.02 10*3/uL (ref 0.00–0.07)
Basophils Absolute: 0 10*3/uL (ref 0.0–0.1)
Basophils Relative: 0 %
Eosinophils Absolute: 0.2 10*3/uL (ref 0.0–0.5)
Eosinophils Relative: 2 %
HCT: 31.6 % — ABNORMAL LOW (ref 36.0–46.0)
Hemoglobin: 10.4 g/dL — ABNORMAL LOW (ref 12.0–15.0)
Immature Granulocytes: 0 %
Lymphocytes Relative: 13 %
Lymphs Abs: 0.9 10*3/uL (ref 0.7–4.0)
MCH: 29.8 pg (ref 26.0–34.0)
MCHC: 32.9 g/dL (ref 30.0–36.0)
MCV: 90.5 fL (ref 80.0–100.0)
Monocytes Absolute: 0.6 10*3/uL (ref 0.1–1.0)
Monocytes Relative: 8 %
Neutro Abs: 5.4 10*3/uL (ref 1.7–7.7)
Neutrophils Relative %: 77 %
Platelets: 178 10*3/uL (ref 150–400)
RBC: 3.49 MIL/uL — ABNORMAL LOW (ref 3.87–5.11)
RDW: 14.9 % (ref 11.5–15.5)
WBC: 7 10*3/uL (ref 4.0–10.5)
nRBC: 0 % (ref 0.0–0.2)

## 2023-04-30 LAB — CMP (CANCER CENTER ONLY)
ALT: 11 U/L (ref 0–44)
AST: 18 U/L (ref 15–41)
Albumin: 3.8 g/dL (ref 3.5–5.0)
Alkaline Phosphatase: 35 U/L — ABNORMAL LOW (ref 38–126)
Anion gap: 9 (ref 5–15)
BUN: 19 mg/dL (ref 8–23)
CO2: 26 mmol/L (ref 22–32)
Calcium: 9 mg/dL (ref 8.9–10.3)
Chloride: 103 mmol/L (ref 98–111)
Creatinine: 0.9 mg/dL (ref 0.44–1.00)
GFR, Estimated: >60 mL/min (ref >60)
Glucose, Bld: 129 mg/dL — ABNORMAL HIGH (ref 70–99)
Potassium: 4.1 mmol/L (ref 3.5–5.1)
Sodium: 138 mmol/L (ref 135–145)
Total Bilirubin: 0.5 mg/dL (ref 0.3–1.2)
Total Protein: 6.9 g/dL (ref 6.5–8.1)

## 2023-04-30 LAB — MAGNESIUM: Magnesium: 1.7 mg/dL (ref 1.7–2.4)

## 2023-04-30 MED ORDER — HEPARIN SOD (PORK) LOCK FLUSH 100 UNIT/ML IV SOLN
INTRAVENOUS | Status: AC
  Filled 2023-04-30: qty 5

## 2023-04-30 MED ORDER — HEPARIN SOD (PORK) LOCK FLUSH 100 UNIT/ML IV SOLN
500.0000 [IU] | Freq: Once | INTRAVENOUS | Status: AC
  Administered 2023-04-30: 500 [IU] via INTRAVENOUS
  Filled 2023-04-30: qty 5

## 2023-04-30 MED ORDER — IOHEXOL 300 MG/ML  SOLN
100.0000 mL | Freq: Once | INTRAMUSCULAR | Status: AC | PRN
  Administered 2023-04-30: 100 mL via INTRAVENOUS

## 2023-05-04 ENCOUNTER — Telehealth: Payer: Self-pay | Admitting: *Deleted

## 2023-05-04 ENCOUNTER — Inpatient Hospital Stay: Payer: Medicare Other | Attending: Oncology | Admitting: Oncology

## 2023-05-04 ENCOUNTER — Encounter: Payer: Self-pay | Admitting: Oncology

## 2023-05-04 ENCOUNTER — Inpatient Hospital Stay: Payer: Medicare Other

## 2023-05-04 VITALS — BP 132/64 | HR 78 | Temp 96.8°F | Resp 16 | Ht 64.0 in | Wt 135.8 lb

## 2023-05-04 DIAGNOSIS — C3432 Malignant neoplasm of lower lobe, left bronchus or lung: Secondary | ICD-10-CM | POA: Insufficient documentation

## 2023-05-04 DIAGNOSIS — D649 Anemia, unspecified: Secondary | ICD-10-CM | POA: Insufficient documentation

## 2023-05-04 DIAGNOSIS — C189 Malignant neoplasm of colon, unspecified: Secondary | ICD-10-CM

## 2023-05-04 DIAGNOSIS — Z87891 Personal history of nicotine dependence: Secondary | ICD-10-CM | POA: Diagnosis not present

## 2023-05-04 DIAGNOSIS — G629 Polyneuropathy, unspecified: Secondary | ICD-10-CM | POA: Diagnosis not present

## 2023-05-04 NOTE — Telephone Encounter (Signed)
Called report  IMPRESSION: CHEST IMPRESSION:   1. Stable small pulmonary nodules. 2. No evidence of thoracic metastasis.   PELVIS IMPRESSION:   1. Two new enhancing lesions along the small bowel anastomosis and along the serosal surface of the small bowel. Findings consistent colorectal carcinoma recurrence. 2. Stable benign LEFT adrenal myelolipoma.   These results will be called to the ordering clinician or representative by the Radiologist Assistant, and communication documented in the PACS or [employer].     Electronically Signed   By: Genevive Bi M.D.   On: 05/04/2023 11:48

## 2023-05-04 NOTE — Progress Notes (Signed)
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  INTERVAL HISTORY: Patient returns to clinic today for further evaluation and discussion of her imaging results.  She continues to have weakness and fatigue, but states this has improved.  She otherwise feels well.  She has a chronic peripheral neuropathy, but no other neurologic complaints.  She denies any recent fevers or illnesses.  She has no chest pain, shortness of breath, cough, or hemoptysis.  She has no abdominal pain.  She denies any nausea, vomiting, constipation, or diarrhea.  She has no melena or hematochezia.  She has no urinary complaints.  Patient offers no further specific complaints today.  REVIEW OF SYSTEMS:   Review of Systems  Constitutional:  Positive for malaise/fatigue. Negative for fever and weight loss.  Respiratory: Negative.  Negative for cough, hemoptysis and shortness of breath.   Cardiovascular: Negative.  Negative for chest pain and leg swelling.  Gastrointestinal: Negative.  Negative for abdominal pain, blood in stool, constipation, diarrhea, melena, nausea and vomiting.  Genitourinary: Negative.  Negative for dysuria.  Musculoskeletal: Negative.  Negative for back pain and falls.  Skin: Negative.  Negative for rash.  Neurological:  Positive for sensory change and weakness. Negative for dizziness, focal weakness and headaches.  Psychiatric/Behavioral: Negative.  The patient is not nervous/anxious.     As per HPI. Otherwise, a complete review of systems is negative.  PAST MEDICAL HISTORY: Past  Medical History:  Diagnosis Date   Anemia    Anxiety    Asthma    Cancer (HCC)    colon   CHF (congestive heart failure) (HCC)    COPD (chronic obstructive pulmonary disease) (HCC)    Diabetes mellitus without complication (HCC)    Hyperlipidemia    Hypertension    Stroke (HCC)     PAST SURGICAL HISTORY: Past Surgical History:  Procedure Laterality Date   COLONOSCOPY WITH PROPOFOL N/A 02/03/2022   Procedure: COLONOSCOPY WITH PROPOFOL;  Surgeon: Toney Reil, MD;  Location: [facility] ENDOSCOPY;  Service: Gastroenterology;  Laterality: N/A;   ESOPHAGOGASTRODUODENOSCOPY (EGD) WITH PROPOFOL N/A 02/03/2022   Procedure: ESOPHAGOGASTRODUODENOSCOPY (EGD) WITH PROPOFOL;  Surgeon: Toney Reil, MD;  Location: [facility] ENDOSCOPY;  Service: Gastroenterology;  Laterality: N/A;   GIVENS CAPSULE STUDY N/A 02/03/2022   Procedure: GIVENS CAPSULE STUDY;  Surgeon: Toney Reil, MD;  Location: [facility] ENDOSCOPY;  Service: Gastroenterology;  Laterality: N/A;  possible Capsule Study   IR IMAGING GUIDED PORT INSERTION  06/18/2022    FAMILY HISTORY: Family History  Problem Relation Age of Onset   Diabetes Mother    Heart attack Father    Colon cancer Father     ADVANCED DIRECTIVES (Y/N):  N  HEALTH MAINTENANCE: Social History   Tobacco Use   Smoking status: Former    Types: E-cigarettes    Quit date: 12/08/2022    Years since quitting: 0.4   Tobacco comments:    Quit Cigarettes 12/2021  Substance Use Topics   Alcohol use: Yes    Comment: rare   Drug use: Never     Colonoscopy:  PAP:  Bone density:  Lipid panel:  Allergies  Allergen Reactions   Codeine Rash    Happened in 1970s    Current Outpatient Medications  Medication Sig Dispense Refill   acetaminophen (TYLENOL) 325 MG tablet Take 650 mg by mouth every 6 (six) hours as needed.     albuterol (VENTOLIN HFA) 108 (90 Base) MCG/ACT inhaler Inhale into the lungs.     atorvastatin (LIPITOR) 40 MG tablet Take 40 mg by mouth  daily.     fenofibrate 54 MG tablet Take 54 mg by mouth daily.     gabapentin (NEURONTIN) 600 MG tablet Take by mouth 2 (two) times daily.     insulin lispro (HUMALOG) 100 UNIT/ML injection Inject 1-10 Units into the skin 3 (three) times daily before meals. Per sliding scale     LANTUS SOLOSTAR 100 UNIT/ML Solostar Pen Inject 12 Units into the skin at bedtime.     lisinopril (ZESTRIL) 20 MG tablet Take 20 mg by mouth daily.     Magnesium Citrate 200 MG TABS Take 1 tablet by mouth in the morning and at bedtime. 250 twice daily     metoprolol succinate (TOPROL-XL) 50 MG 24 hr tablet Take 50 mg by mouth daily. Take with or immediately following a meal.     ranolazine (RANEXA) 500 MG 12 hr tablet Take 500 mg by mouth 2 (two) times daily.     sitaGLIPtin-metformin (JANUMET) 50-1000 MG tablet Take 1 tablet by mouth daily.     venlafaxine XR (EFFEXOR-XR) 75 MG 24 hr capsule Take 75 mg by mouth daily.     pantoprazole (PROTONIX) 40 MG tablet Take 1 tablet (40 mg total) by mouth daily. (Patient not taking: Reported on 01/02/2023) 30 tablet 5   No current facility-administered medications for this visit.   Facility-Administered Medications Ordered in Other Visits  Medication Dose Route Frequency Provider Last Rate Last Admin   sodium chloride flush (NS) 0.9 % injection 10 mL  10 mL Intracatheter PRN Jeralyn Ruths, MD   10 mL at 10/09/22 1353    OBJECTIVE: Vitals:   05/04/23 1044  BP: 132/64  Pulse: 78  Resp: 16  Temp: (!) 96.8 F (36 C)  SpO2: 98%      Body mass index is 23.31 kg/m.    ECOG FS:1 - Symptomatic but completely ambulatory  General: Well-developed, well-nourished, no acute distress. Eyes: Pink conjunctiva, anicteric sclera. HEENT: Normocephalic, moist mucous membranes. Lungs: No audible wheezing or coughing. Heart: Regular rate and rhythm. Abdomen: Soft, nontender, no obvious distention. Musculoskeletal: No edema, cyanosis, or clubbing. Neuro: Alert, answering all  questions appropriately. Cranial nerves grossly intact. Skin: No rashes or petechiae noted. Psych: Normal affect.   LAB RESULTS:  Lab Results  Component Value Date   NA 138 04/30/2023   K 4.1 04/30/2023   CL 103 04/30/2023   CO2 26 04/30/2023   GLUCOSE 129 (H) 04/30/2023   BUN 19 04/30/2023   CREATININE 0.90 04/30/2023   CALCIUM 9.0 04/30/2023   PROT 6.9 04/30/2023   ALBUMIN 3.8 04/30/2023   AST 18 04/30/2023   ALT 11 04/30/2023   ALKPHOS 35 (L) 04/30/2023   BILITOT 0.5 04/30/2023   GFRNONAA >60 04/30/2023    Lab Results  Component Value Date   WBC 7.0 04/30/2023   NEUTROABS 5.4 04/30/2023   HGB 10.4 (L) 04/30/2023   HCT 31.6 (L) 04/30/2023   MCV 90.5 04/30/2023   PLT 178 04/30/2023     STUDIES: CT CHEST ABDOMEN PELVIS W CONTRAST  Result Date: 05/04/2023 CLINICAL DATA:  History  of colorectal carcinoma. Follow-up lung nodules. Prior surgical resection. * Tracking Code: BO * EXAM: CT CHEST, ABDOMEN, AND PELVIS WITH CONTRAST TECHNIQUE: Multidetector CT imaging of the chest, abdomen and pelvis was performed following the standard protocol during bolus administration of intravenous contrast. RADIATION DOSE REDUCTION: This exam was performed according to the departmental dose-optimization program which includes automated exposure control, adjustment of the mA and/or kV according to patient size and/or use of iterative reconstruction technique. CONTRAST:  OMNIPAQUE IOHEXOL 300 MG/ML  SOLN COMPARISON:  None Available. FINDINGS: CT CHEST FINDINGS CT CHEST FINDINGS Cardiovascular: Port in the anterior chest wall with tip in distal SVC. No significant vascular findings. Normal heart size. No pericardial effusion. Mediastinum/Nodes: No axillary or supraclavicular adenopathy. No mediastinal or hilar adenopathy. No pericardial fluid. Esophagus normal. Lungs/Pleura: Again demonstrated several very small pulmonary nodules measuring 1-2 mm. Nodules are either not changed or resolved from  comparison exam. Example nodule RIGHT upper lobe measuring 2 mm on image 44/4. Example nodule in the LEFT upper lobe measuring 2 mm on 51/4.) no new pulmonary nodules. Musculoskeletal: No aggressive osseous lesion. CT ABDOMEN AND PELVIS FINDINGS Hepatobiliary: Hypodense lesion in the RIGHT hepatic lobe measures 9 mm most consistent benign cysts. No change from comparison exam. No enhancing lesion. Small gallstone noted. Pancreas: Pancreas is normal. No ductal dilatation. No pancreatic inflammation. Spleen: Normal spleen Adrenals/urinary tract: Enlargement of the LEFT adrenal gland to 2.7 cm with central macroscopic fat lesion. No change from prior. Findings consistent benign myelolipoma. No follow-up recommended for benign adrenal lesion. No enhancing renal cortical lesion. Ureters and bladder normal Stomach/Bowel: Stable postsurgical change in the gastric antrum. Duodenum normal. There is a surgical anastomosis in the mid small bowel. new round peripheral enhancing lesion adjacent to the small bowel anastomosis in the mid abdomen measuring 15 mm x 15 mm (image 82/2). In retrospect there is a subtle 10 mm lesion at this site. In the more distal small bowel there is a new lesion along the serosal surface of the bowel measuring 31 mm x 27 mm (image 95/2). Subtle 20 mm on comparison exam. Postsurgical change in the colon evidence obstruction or nodularity. Vascular/Lymphatic: Abdominal aorta is normal caliber. There is no retroperitoneal or periportal lymphadenopathy. No pelvic lymphadenopathy. Reproductive: Uterus and adnexa unremarkable. Other: Postsurgical change along the ventral peritoneal surface with mesh. Musculoskeletal: No aggressive osseous lesion. IMPRESSION: CHEST IMPRESSION: 1. Stable small pulmonary nodules. 2. No evidence of thoracic metastasis. PELVIS IMPRESSION: 1. Two new enhancing lesions along the small bowel anastomosis and along the serosal surface of the small bowel. Findings consistent  colorectal carcinoma recurrence. 2. Stable benign LEFT adrenal myelolipoma. These results will be called to the ordering clinician or representative by the Radiologist Assistant, and communication documented in the PACS or [employer]. Electronically Signed   By: Genevive Bi M.D.   On: 05/04/2023 11:48    ASSESSMENT: Stage IIIc colon cancer.  PLAN:    Stage IIIc colon cancer: Patient underwent complete surgical resection at [facility] on May 23, 2022.  Large colon mass was noted to partially invade stomach as well as surrounding mesentery.  PET scan did not reveal any metastatic disease other than a positive lung nodule which was confirmed to be a second primary.  Patient completed her final cycle of adjuvant FOLFOX on December 23, 2022.  Patient's preop CEA was 171.  Recently, CEA has increased from 18.6 to 125.0.  Today's result is pending.  Patient's most recent CT scan reviewed independently and  report as above highly suspicious of progressive disease.  Will await results of CEA today and then discuss patient at tumor board on Thursday.  Currently patient has follow-up in 6 weeks with repeat laboratory work and further evaluation, but if retreatment is deemed necessary patient will follow-up sooner.  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  Patient completed XRT in May 2023.  Repeat CT scan as above.   Pain: Resolved. Anemia: Chronic and unchanged.  Patient's hemoglobin is 10.4 today. Neuropathy/falls: Patient was previously given a referral to Occupational Therapy.  I spent a total of 30 minutes reviewing chart data, face-to-face evaluation with the patient, counseling and coordination of care as detailed above.   Patient expressed understanding and was in agreement with this plan. She also understands that She can call clinic at any time with any questions, concerns, or complaints.    Cancer Staging  Colon  [facility]) Staging form: Colon and Rectum, AJCC 8th Edition - Pathologic stage from 06/10/2022: Stage IIIC (pT4b, pN1c, cM0) - Signed by Jeralyn Ruths, MD on 06/10/2022 Stage prefix: Initial diagnosis Total positive nodes: 0 Histologic grading system: 4 grade system Histologic grade (G): G3   Jeralyn Ruths, MD   05/04/2023 1:05 PM

## 2023-05-04 NOTE — Progress Notes (Signed)
Has had nausea every morning for the past 2 weeks.

## 2023-05-05 LAB — CEA: CEA: 181 ng/mL — ABNORMAL HIGH (ref 0.0–4.7)

## 2023-05-07 ENCOUNTER — Other Ambulatory Visit: Payer: Self-pay

## 2023-05-07 ENCOUNTER — Other Ambulatory Visit: Payer: Medicare Other

## 2023-05-07 DIAGNOSIS — C184 Malignant neoplasm of transverse colon: Secondary | ICD-10-CM

## 2023-05-07 NOTE — Progress Notes (Signed)
Called and spoke with Ms. Dundee. Provided update from tumor board discussion. Request for restaging PET and based on results possible referral back to surgeon. Order for PET placed.

## 2023-05-15 ENCOUNTER — Ambulatory Visit
Admission: RE | Admit: 2023-05-15 | Discharge: 2023-05-15 | Disposition: A | Discharge status: Home / Self Care | Payer: Medicare Other | Source: Ambulatory Visit | Attending: Oncology | Admitting: Oncology

## 2023-05-15 DIAGNOSIS — M47816 Spondylosis without myelopathy or radiculopathy, lumbar region: Secondary | ICD-10-CM | POA: Insufficient documentation

## 2023-05-15 DIAGNOSIS — C184 Malignant neoplasm of transverse colon: Secondary | ICD-10-CM | POA: Insufficient documentation

## 2023-05-15 DIAGNOSIS — D3502 Benign neoplasm of left adrenal gland: Secondary | ICD-10-CM | POA: Diagnosis not present

## 2023-05-15 DIAGNOSIS — K5730 Diverticulosis of large intestine without perforation or abscess without bleeding: Secondary | ICD-10-CM | POA: Insufficient documentation

## 2023-05-15 DIAGNOSIS — I700 Atherosclerosis of aorta: Secondary | ICD-10-CM | POA: Insufficient documentation

## 2023-05-15 DIAGNOSIS — D1779 Benign lipomatous neoplasm of other sites: Secondary | ICD-10-CM | POA: Diagnosis not present

## 2023-05-15 DIAGNOSIS — J439 Emphysema, unspecified: Secondary | ICD-10-CM | POA: Insufficient documentation

## 2023-05-15 DIAGNOSIS — I2510 Atherosclerotic heart disease of native coronary artery without angina pectoris: Secondary | ICD-10-CM | POA: Insufficient documentation

## 2023-05-15 DIAGNOSIS — K8020 Calculus of gallbladder without cholecystitis without obstruction: Secondary | ICD-10-CM | POA: Insufficient documentation

## 2023-05-15 LAB — GLUCOSE, CAPILLARY: Glucose-Capillary: 106 mg/dL — ABNORMAL HIGH (ref 70–99)

## 2023-05-15 MED ORDER — FLUDEOXYGLUCOSE F - 18 (FDG) INJECTION
7.0000 | Freq: Once | INTRAVENOUS | Status: AC | PRN
  Administered 2023-05-15: 7.6 via INTRAVENOUS

## 2023-05-27 ENCOUNTER — Telehealth: Payer: Self-pay

## 2023-05-27 NOTE — Telephone Encounter (Signed)
Spoke to Jessica Stout regarding her PET scan. The results are back and we can move her appointment to see Dr. [name] up so she can receive results. In the interim, we will refer her back to Dr. Luciano Cutter for surgical opinion.

## 2023-05-29 ENCOUNTER — Other Ambulatory Visit: Payer: Self-pay

## 2023-05-29 DIAGNOSIS — C184 Malignant neoplasm of transverse colon: Secondary | ICD-10-CM

## 2023-06-01 ENCOUNTER — Encounter: Payer: Self-pay | Admitting: Oncology

## 2023-06-01 ENCOUNTER — Inpatient Hospital Stay (HOSPITAL_BASED_OUTPATIENT_CLINIC_OR_DEPARTMENT_OTHER): Payer: Medicare Other | Admitting: Oncology

## 2023-06-01 ENCOUNTER — Inpatient Hospital Stay: Payer: Medicare Other | Attending: Oncology

## 2023-06-01 VITALS — BP 137/68 | HR 77 | Temp 97.3°F | Resp 16 | Ht 64.0 in | Wt 133.8 lb

## 2023-06-01 DIAGNOSIS — Z923 Personal history of irradiation: Secondary | ICD-10-CM | POA: Insufficient documentation

## 2023-06-01 DIAGNOSIS — Z5111 Encounter for antineoplastic chemotherapy: Secondary | ICD-10-CM | POA: Diagnosis present

## 2023-06-01 DIAGNOSIS — Z800 Family history of malignant neoplasm of digestive organs: Secondary | ICD-10-CM | POA: Insufficient documentation

## 2023-06-01 DIAGNOSIS — C3432 Malignant neoplasm of lower lobe, left bronchus or lung: Secondary | ICD-10-CM | POA: Diagnosis not present

## 2023-06-01 DIAGNOSIS — C184 Malignant neoplasm of transverse colon: Secondary | ICD-10-CM | POA: Insufficient documentation

## 2023-06-01 DIAGNOSIS — Z95828 Presence of other vascular implants and grafts: Secondary | ICD-10-CM

## 2023-06-01 DIAGNOSIS — D649 Anemia, unspecified: Secondary | ICD-10-CM | POA: Insufficient documentation

## 2023-06-01 DIAGNOSIS — Z87891 Personal history of nicotine dependence: Secondary | ICD-10-CM | POA: Diagnosis not present

## 2023-06-01 DIAGNOSIS — D72819 Decreased white blood cell count, unspecified: Secondary | ICD-10-CM | POA: Insufficient documentation

## 2023-06-01 DIAGNOSIS — G620 Drug-induced polyneuropathy: Secondary | ICD-10-CM | POA: Diagnosis not present

## 2023-06-01 LAB — CBC WITH DIFFERENTIAL (CANCER CENTER ONLY)
Abs Immature Granulocytes: 0.03 10*3/uL (ref 0.00–0.07)
Basophils Absolute: 0 10*3/uL (ref 0.0–0.1)
Basophils Relative: 0 %
Eosinophils Absolute: 0.1 10*3/uL (ref 0.0–0.5)
Eosinophils Relative: 1 %
HCT: 33 % — ABNORMAL LOW (ref 36.0–46.0)
Hemoglobin: 10.8 g/dL — ABNORMAL LOW (ref 12.0–15.0)
Immature Granulocytes: 1 %
Lymphocytes Relative: 13 %
Lymphs Abs: 0.8 10*3/uL (ref 0.7–4.0)
MCH: 29.8 pg (ref 26.0–34.0)
MCHC: 32.7 g/dL (ref 30.0–36.0)
MCV: 90.9 fL (ref 80.0–100.0)
Monocytes Absolute: 0.5 10*3/uL (ref 0.1–1.0)
Monocytes Relative: 7 %
Neutro Abs: 5.2 10*3/uL (ref 1.7–7.7)
Neutrophils Relative %: 78 %
Platelet Count: 218 10*3/uL (ref 150–400)
RBC: 3.63 MIL/uL — ABNORMAL LOW (ref 3.87–5.11)
RDW: 14.6 % (ref 11.5–15.5)
WBC Count: 6.6 10*3/uL (ref 4.0–10.5)
nRBC: 0 % (ref 0.0–0.2)

## 2023-06-01 LAB — CMP (CANCER CENTER ONLY)
ALT: 12 U/L (ref 0–44)
AST: 17 U/L (ref 15–41)
Albumin: 3.9 g/dL (ref 3.5–5.0)
Alkaline Phosphatase: 37 U/L — ABNORMAL LOW (ref 38–126)
Anion gap: 8 (ref 5–15)
BUN: 26 mg/dL — ABNORMAL HIGH (ref 8–23)
CO2: 24 mmol/L (ref 22–32)
Calcium: 9.1 mg/dL (ref 8.9–10.3)
Chloride: 106 mmol/L (ref 98–111)
Creatinine: 1 mg/dL (ref 0.44–1.00)
GFR, Estimated: 59 mL/min — ABNORMAL LOW (ref >60)
Glucose, Bld: 166 mg/dL — ABNORMAL HIGH (ref 70–99)
Potassium: 4 mmol/L (ref 3.5–5.1)
Sodium: 138 mmol/L (ref 135–145)
Total Bilirubin: 0.6 mg/dL (ref 0.3–1.2)
Total Protein: 7.1 g/dL (ref 6.5–8.1)

## 2023-06-01 LAB — MAGNESIUM: Magnesium: 1.7 mg/dL (ref 1.7–2.4)

## 2023-06-01 MED ORDER — LIDOCAINE-PRILOCAINE 2.5-2.5 % EX CREA
TOPICAL_CREAM | CUTANEOUS | 3 refills | Status: DC
Start: 2023-06-01 — End: 2023-07-24

## 2023-06-01 MED ORDER — SODIUM CHLORIDE 0.9% FLUSH
10.0000 mL | Freq: Once | INTRAVENOUS | Status: AC
  Administered 2023-06-01: 10 mL via INTRAVENOUS
  Filled 2023-06-01: qty 10

## 2023-06-01 MED ORDER — LOPERAMIDE HCL 2 MG PO CAPS
ORAL_CAPSULE | ORAL | 2 refills | Status: DC
Start: 2023-06-01 — End: 2023-07-24

## 2023-06-01 MED ORDER — HEPARIN SOD (PORK) LOCK FLUSH 100 UNIT/ML IV SOLN
500.0000 [IU] | Freq: Once | INTRAVENOUS | Status: AC
  Administered 2023-06-01: 500 [IU] via INTRAVENOUS
  Filled 2023-06-01: qty 5

## 2023-06-01 NOTE — Progress Notes (Signed)
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  INTERVAL HISTORY: Patient returns to clinic today for further evaluation, discussion of her PET scan results, and treatment planning.  She continues to have chronic weakness and fatigue.  She has a chronic peripheral neuropathy, but no other neurologic complaints.  She denies any recent fevers or illnesses.  She has no chest pain, shortness of breath, cough, or hemoptysis.  She has no abdominal pain.  She denies any nausea, vomiting, constipation, or diarrhea.  She has no melena or hematochezia.  She has no urinary complaints.  Patient no further specific complaints today.  REVIEW OF SYSTEMS:   Review of Systems  Constitutional:  Positive for malaise/fatigue. Negative for fever and weight loss.  Respiratory: Negative.  Negative for cough, hemoptysis and shortness of breath.   Cardiovascular: Negative.  Negative for chest pain and leg swelling.  Gastrointestinal: Negative.  Negative for abdominal pain, blood in stool, constipation, diarrhea, melena, nausea and vomiting.  Genitourinary: Negative.  Negative for dysuria.  Musculoskeletal: Negative.  Negative for back pain and falls.  Skin: Negative.  Negative for rash.  Neurological:  Positive for sensory change and weakness. Negative for dizziness, focal weakness and headaches.  Psychiatric/Behavioral: Negative.  The patient is not nervous/anxious.     As per HPI. Otherwise, a complete review of systems is negative.  PAST MEDICAL HISTORY: Past Medical History:  Diagnosis Date    Anemia    Anxiety    Asthma    Cancer (HCC)    colon   CHF (congestive heart failure) (HCC)    COPD (chronic obstructive pulmonary disease) (HCC)    Diabetes mellitus without complication (HCC)    Hyperlipidemia    Hypertension    Stroke (HCC)     PAST SURGICAL HISTORY: Past Surgical History:  Procedure Laterality Date   COLONOSCOPY WITH PROPOFOL N/A 02/03/2022   Procedure: COLONOSCOPY WITH PROPOFOL;  Surgeon: Toney Reil, MD;  Location: [facility] ENDOSCOPY;  Service: Gastroenterology;  Laterality: N/A;   ESOPHAGOGASTRODUODENOSCOPY (EGD) WITH PROPOFOL N/A 02/03/2022   Procedure: ESOPHAGOGASTRODUODENOSCOPY (EGD) WITH PROPOFOL;  Surgeon: Toney Reil, MD;  Location: [facility] ENDOSCOPY;  Service: Gastroenterology;  Laterality: N/A;   GIVENS CAPSULE STUDY N/A 02/03/2022   Procedure: GIVENS CAPSULE STUDY;  Surgeon: Toney Reil, MD;  Location: [facility] ENDOSCOPY;  Service: Gastroenterology;  Laterality: N/A;  possible Capsule Study   IR IMAGING GUIDED PORT INSERTION  06/18/2022    FAMILY HISTORY: Family History  Problem Relation Age of Onset   Diabetes Mother    Heart attack Father    Colon cancer Father     ADVANCED DIRECTIVES (Y/N):  N  HEALTH MAINTENANCE: Social History   Tobacco Use   Smoking status: Former    Types: E-cigarettes    Quit date: 12/08/2022    Years since quitting: 0.4   Tobacco comments:    Quit Cigarettes 12/2021  Substance Use Topics   Alcohol use: Yes    Comment: rare   Drug use: Never     Colonoscopy:  PAP:  Bone density:  Lipid panel:  Allergies  Allergen Reactions   Codeine Rash  Happened in 1970s    Current Outpatient Medications  Medication Sig Dispense Refill   acetaminophen (TYLENOL) 325 MG tablet Take 650 mg by mouth every 6 (six) hours as needed.     albuterol (VENTOLIN HFA) 108 (90 Base) MCG/ACT inhaler Inhale into the lungs.     atorvastatin (LIPITOR) 40 MG tablet Take 40 mg by mouth daily.     fenofibrate 54 MG  tablet Take 54 mg by mouth daily.     gabapentin (NEURONTIN) 600 MG tablet Take by mouth 2 (two) times daily.     insulin lispro (HUMALOG) 100 UNIT/ML injection Inject 1-10 Units into the skin 3 (three) times daily before meals. Per sliding scale     LANTUS SOLOSTAR 100 UNIT/ML Solostar Pen Inject 12 Units into the skin at bedtime.     lisinopril (ZESTRIL) 20 MG tablet Take 20 mg by mouth daily.     Magnesium Citrate 200 MG TABS Take 1 tablet by mouth in the morning and at bedtime. 250 twice daily     metoprolol succinate (TOPROL-XL) 50 MG 24 hr tablet Take 50 mg by mouth daily. Take with or immediately following a meal.     ranolazine (RANEXA) 500 MG 12 hr tablet Take 500 mg by mouth 2 (two) times daily.     sitaGLIPtin-metformin (JANUMET) 50-1000 MG tablet Take 1 tablet by mouth daily.     venlafaxine XR (EFFEXOR-XR) 75 MG 24 hr capsule Take 75 mg by mouth daily.     lidocaine-prilocaine (EMLA) cream Apply to affected area once 30 g 3   loperamide (IMODIUM) 2 MG capsule Take 2 tabs by mouth with first loose stool, then 1 tab with each additional loose stool as needed. Do not exceed 8 tabs in a 24-hour period 60 capsule 2   pantoprazole (PROTONIX) 40 MG tablet Take 1 tablet (40 mg total) by mouth daily. (Patient not taking: Reported on 01/02/2023) 30 tablet 5   No current facility-administered medications for this visit.    OBJECTIVE: Vitals:   06/01/23 0847  BP: 137/68  Pulse: 77  Resp: 16  Temp: (!) 97.3 F (36.3 C)  SpO2: 97%      Body mass index is 22.97 kg/m.    ECOG FS:1 - Symptomatic but completely ambulatory  General: Well-developed, well-nourished, no acute distress. Eyes: Pink conjunctiva, anicteric sclera. HEENT: Normocephalic, moist mucous membranes. Lungs: No audible wheezing or coughing. Heart: Regular rate and rhythm. Abdomen: Soft, nontender, no obvious distention. Musculoskeletal: No edema, cyanosis, or clubbing. Neuro: Alert, answering all questions  appropriately. Cranial nerves grossly intact. Skin: No rashes or petechiae noted. Psych: Normal affect.  LAB RESULTS:  Lab Results  Component Value Date   NA 138 06/01/2023   K 4.0 06/01/2023   CL 106 06/01/2023   CO2 24 06/01/2023   GLUCOSE 166 (H) 06/01/2023   BUN 26 (H) 06/01/2023   CREATININE 1.00 06/01/2023   CALCIUM 9.1 06/01/2023   PROT 7.1 06/01/2023   ALBUMIN 3.9 06/01/2023   AST 17 06/01/2023   ALT 12 06/01/2023   ALKPHOS 37 (L) 06/01/2023   BILITOT 0.6 06/01/2023   GFRNONAA 59 (L) 06/01/2023    Lab Results  Component Value Date   WBC 6.6 06/01/2023   NEUTROABS 5.2 06/01/2023   HGB 10.8 (L) 06/01/2023   HCT 33.0 (L) 06/01/2023   MCV 90.9 06/01/2023   PLT 218 06/01/2023     STUDIES: NM PET Image Restag (PS) Skull Base To Thigh  Result Date: 05/20/2023 CLINICAL DATA:  Subsequent treatment strategy for transverse colon cancer . EXAM: NUCLEAR MEDICINE PET SKULL BASE TO THIGH TECHNIQUE: 7.6 mCi F-18 FDG was injected intravenously. Full-ring PET imaging was performed from the skull base to thigh after the radiotracer. CT data was obtained and used for attenuation correction and anatomic localization. Fasting blood glucose: 106 mg/dl COMPARISON:  CT scan 16/09/9603 and prior PET-CT from 02/27/2022 FINDINGS: Mediastinal blood pool activity: SUV max 2.0 Liver activity: SUV max NA NECK: No significant abnormal hypermetabolic activity in this region. Incidental CT findings: None. CHEST: No significant abnormal hypermetabolic activity in this region. Postoperative findings in the superior segment left lower lobe. Incidental CT findings: Right Port-A-Cath tip: Right atrium. Coronary, aortic arch, and branch vessel atherosclerotic vascular disease. Mild cardiomegaly. Centrilobular emphysema. ABDOMEN/PELVIS: 2.5 cm focus of hypermetabolic nodularity along the postoperative findings in the inferior gastric body, maximum SUV 8.4. Recurrent malignancy along the postoperative portion of  the stomach is not excluded given this appearance. At the site of the density along the small bowel anastomosis referenced on 04/30/23 CT scan, there is ring-like accentuated activity corresponding to the margins of the hypodense lesion, with maximum SUV of 5.0. Although this is substantially less active than some of the physiologic activity in bowel, recurrence at this site is not confidently excluded. In the vicinity of the masslike nodularity in the central pelvis along small bowel loops, we demonstrate a 3.4 cm focus of hypermetabolic activity, maximum SUV 17.4, suspicious for malignancy. The associated mass is shown on image 132 of series 4. Scattered multifocal bowel activity, likely physiologic. No hepatic metastatic lesion observed. Accentuated activity along the mesh along the superficial fascia margin, benign. Incidental CT findings: Atherosclerosis is present, including aortoiliac atherosclerotic disease. 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 7 mm gallstone in the gallbladder. Sigmoid colon diverticulosis. SKELETON: No significant abnormal hypermetabolic activity in this region. Incidental CT findings: Lumbar spondylosis and degenerative disc disease. Lower cervical spondylosis. IMPRESSION: 1. Hypermetabolic activity along the postoperative findings in the inferior gastric body, suspicious for recurrent malignancy, maximum SUV 8.4. 2. Rim hypermetabolic activity along the small bowel anastomosis in the central pelvis, suspicious for recurrence, maximum SUV 5.0. 3. Focal hypermetabolic activity along small bowel loops in the lower pelvis corresponding to nodularity seen in this vicinity on recent diagnostic CT, suspicious for a metastatic lesion along the small bowel, maximum SUV 17.4. 4. Aortic and coronary atherosclerosis. 5. Left adrenal adenoma and superimposed left adrenal myelolipoma, benign and not requiring further  imaging follow up. 6. Cholelithiasis. 7. Sigmoid colon diverticulosis. 8. Lumbar spondylosis and degenerative disc disease. 9. Emphysema. Aortic Atherosclerosis (ICD10-I70.0) and Emphysema (ICD10-J43.9). Electronically Signed   By: Gaylyn Rong M.D.   On: 05/20/2023 14:42    ASSESSMENT: Stage IIIc colon cancer.  PLAN:    Stage IIIc colon cancer: Patient underwent complete surgical resection at [facility] on May 23, 2022.  Large colon mass was noted to partially invade stomach as well as surrounding mesentery.  PET scan did not reveal any metastatic disease other than a positive lung nodule which was confirmed to be a second primary.  Patient completed her final cycle of adjuvant FOLFOX on December 23, 2022.  Patient's preop CEA was 171.  Recently, CEA has increased from 18.6 to 181.0.  Patient's most recent CT scan reviewed independently and report as above highly suspicious of progressive disease.  PET scan results from May 20, 2023 reviewed independently and report as above with  clear progression of disease.  Unlikely patient is a surgical candidate, but has been to keep her appointment with surgery at the end of the month for further evaluation.  Plan to reinitiate palliative chemotherapy using FOLFIRI every 2 weeks.  Will hold Avastin at this time in case surgery is in fact an option.  Return to clinic on June 09, 2023 to initiate cycle 1. 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  Patient completed XRT in May 2023.  PET scan as above with no obvious evidence of recurrence.   Pain: Resolved. Anemia: Hemoglobin has trended up slightly to 10.8, monitor. Neuropathy/falls: Patient was previously given a referral to Occupational Therapy.  I spent a total of 30 minutes reviewing chart data, face-to-face evaluation with the patient, counseling and coordination of care as detailed above.    Patient expressed understanding and  was in agreement with this plan. She also understands that She can call clinic at any time with any questions, concerns, or complaints.    Cancer Staging  Colon cancer [facility]) Staging form: Colon and Rectum, AJCC 8th Edition - Pathologic stage from 06/10/2022: Stage IIIC (pT4b, pN1c, cM0) - Signed by Jeralyn Ruths, MD on 06/10/2022 Stage prefix: Initial diagnosis Total positive nodes: 0 Histologic grading system: 4 grade system Histologic grade (G): G3   Jeralyn Ruths, MD   06/01/2023 10:22 AM

## 2023-06-01 NOTE — Progress Notes (Signed)
DISCONTINUE ON PATHWAY REGIMEN - Colorectal     A cycle is every 14 days:     Oxaliplatin      Leucovorin      Fluorouracil      Fluorouracil   **Always confirm dose/schedule in your pharmacy ordering system**  REASON: Disease Progression PRIOR TREATMENT: COS67: [id number] q14 Days x 6 Months TREATMENT RESPONSE: Complete Response (CR)  START OFF PATHWAY REGIMEN - Colorectal   [id number] (Leucovorin IV D1 + Fluorouracil IV D1/CIV D1,2 + Irinotecan IV D1) q14 Days:   A cycle is every 14 days:     Irinotecan      Leucovorin      Fluorouracil      Fluorouracil   **Always confirm dose/schedule in your pharmacy ordering system**  Patient Characteristics: Postoperative without Neoadjuvant Therapy, M0 (Pathologic Staging), Colon, Stage III, High Risk (pT4 or pN2) Tumor Location: Colon Therapeutic Status: Postoperative without Neoadjuvant Therapy, M0 (Pathologic Staging) AJCC M Category: cM0 AJCC T Category: pT4b AJCC N Category: pN1c AJCC 8 Stage Grouping: IIIC Intent of Therapy: Non-Curative / Palliative Intent, Discussed with Patient

## 2023-06-02 LAB — CEA: CEA: 139 ng/mL — ABNORMAL HIGH (ref 0.0–4.7)

## 2023-06-08 ENCOUNTER — Telehealth: Payer: Self-pay | Admitting: *Deleted

## 2023-06-08 NOTE — Telephone Encounter (Signed)
Call from [place], [place] regarding message from [place] that patient needed to be referred back to them for recurrence. The doctors there feel patient needs a repeat EGD and Colonoscopy since she has not had one since her recurrence.. She can be reached if needed at [phone]

## 2023-06-09 ENCOUNTER — Inpatient Hospital Stay: Payer: Medicare Other

## 2023-06-09 ENCOUNTER — Inpatient Hospital Stay (HOSPITAL_BASED_OUTPATIENT_CLINIC_OR_DEPARTMENT_OTHER): Payer: Medicare Other | Admitting: Oncology

## 2023-06-09 ENCOUNTER — Encounter: Payer: Self-pay | Admitting: Oncology

## 2023-06-09 DIAGNOSIS — C184 Malignant neoplasm of transverse colon: Secondary | ICD-10-CM | POA: Diagnosis not present

## 2023-06-09 DIAGNOSIS — Z5111 Encounter for antineoplastic chemotherapy: Secondary | ICD-10-CM | POA: Diagnosis not present

## 2023-06-09 LAB — MAGNESIUM: Magnesium: 1.8 mg/dL (ref 1.7–2.4)

## 2023-06-09 LAB — CMP (CANCER CENTER ONLY)
ALT: 10 U/L (ref 0–44)
AST: 18 U/L (ref 15–41)
Albumin: 3.7 g/dL (ref 3.5–5.0)
Alkaline Phosphatase: 35 U/L — ABNORMAL LOW (ref 38–126)
Anion gap: 7 (ref 5–15)
BUN: 24 mg/dL — ABNORMAL HIGH (ref 8–23)
CO2: 27 mmol/L (ref 22–32)
Calcium: 9.5 mg/dL (ref 8.9–10.3)
Chloride: 105 mmol/L (ref 98–111)
Creatinine: 0.84 mg/dL (ref 0.44–1.00)
GFR, Estimated: >60 mL/min (ref >60)
Glucose, Bld: 131 mg/dL — ABNORMAL HIGH (ref 70–99)
Potassium: 4.5 mmol/L (ref 3.5–5.1)
Sodium: 139 mmol/L (ref 135–145)
Total Bilirubin: 0.3 mg/dL (ref 0.3–1.2)
Total Protein: 6.8 g/dL (ref 6.5–8.1)

## 2023-06-09 LAB — CBC WITH DIFFERENTIAL (CANCER CENTER ONLY)
Abs Immature Granulocytes: 0.01 10*3/uL (ref 0.00–0.07)
Basophils Absolute: 0 10*3/uL (ref 0.0–0.1)
Basophils Relative: 1 %
Eosinophils Absolute: 0.1 10*3/uL (ref 0.0–0.5)
Eosinophils Relative: 2 %
HCT: 31.2 % — ABNORMAL LOW (ref 36.0–46.0)
Hemoglobin: 9.9 g/dL — ABNORMAL LOW (ref 12.0–15.0)
Immature Granulocytes: 0 %
Lymphocytes Relative: 15 %
Lymphs Abs: 0.8 10*3/uL (ref 0.7–4.0)
MCH: 29.5 pg (ref 26.0–34.0)
MCHC: 31.7 g/dL (ref 30.0–36.0)
MCV: 92.9 fL (ref 80.0–100.0)
Monocytes Absolute: 0.4 10*3/uL (ref 0.1–1.0)
Monocytes Relative: 8 %
Neutro Abs: 3.9 10*3/uL (ref 1.7–7.7)
Neutrophils Relative %: 74 %
Platelet Count: 199 10*3/uL (ref 150–400)
RBC: 3.36 MIL/uL — ABNORMAL LOW (ref 3.87–5.11)
RDW: 14.3 % (ref 11.5–15.5)
WBC Count: 5.2 10*3/uL (ref 4.0–10.5)
nRBC: 0 % (ref 0.0–0.2)

## 2023-06-09 MED ORDER — ATROPINE SULFATE 1 MG/ML IV SOLN
0.5000 mg | Freq: Once | INTRAVENOUS | Status: AC | PRN
  Administered 2023-06-09: 0.5 mg via INTRAVENOUS
  Filled 2023-06-09: qty 1

## 2023-06-09 MED ORDER — SODIUM CHLORIDE 0.9 % IV SOLN
2400.0000 mg/m2 | INTRAVENOUS | Status: DC
  Administered 2023-06-09: 4000 mg via INTRAVENOUS
  Filled 2023-06-09: qty 80

## 2023-06-09 MED ORDER — SODIUM CHLORIDE 0.9 % IV SOLN
10.0000 mg | Freq: Once | INTRAVENOUS | Status: AC
  Administered 2023-06-09: 10 mg via INTRAVENOUS
  Filled 2023-06-09: qty 10

## 2023-06-09 MED ORDER — SODIUM CHLORIDE 0.9 % IV SOLN
180.0000 mg/m2 | Freq: Once | INTRAVENOUS | Status: AC
  Administered 2023-06-09: 300 mg via INTRAVENOUS
  Filled 2023-06-09: qty 15

## 2023-06-09 MED ORDER — ONDANSETRON HCL 4 MG/2ML IJ SOLN
8.0000 mg | Freq: Once | INTRAMUSCULAR | Status: AC
  Administered 2023-06-09: 8 mg via INTRAVENOUS

## 2023-06-09 MED ORDER — SODIUM CHLORIDE 0.9 % IV SOLN
Freq: Once | INTRAVENOUS | Status: AC
  Filled 2023-06-09: qty 250

## 2023-06-09 MED ORDER — SODIUM CHLORIDE 0.9 % IV SOLN
400.0000 mg/m2 | Freq: Once | INTRAVENOUS | Status: AC
  Administered 2023-06-09: 664 mg via INTRAVENOUS
  Filled 2023-06-09: qty 33.2

## 2023-06-09 MED ORDER — PALONOSETRON HCL INJECTION 0.25 MG/5ML
0.2500 mg | Freq: Once | INTRAVENOUS | Status: AC
  Administered 2023-06-09: 0.25 mg via INTRAVENOUS
  Filled 2023-06-09: qty 5

## 2023-06-09 MED ORDER — FLUOROURACIL CHEMO INJECTION 2.5 GM/50ML
400.0000 mg/m2 | Freq: Once | INTRAVENOUS | Status: AC
  Administered 2023-06-09: 650 mg via INTRAVENOUS
  Filled 2023-06-09: qty 13

## 2023-06-09 NOTE — Patient Instructions (Signed)
[facility]  Discharge Instructions: Thank you for choosing [facility] to provide your oncology and hematology care.  If you have a lab appointment with the Cancer Center, please go directly to the Cancer Center and check in at the registration area.  Wear comfortable clothing and clothing appropriate for easy access to any Portacath or PICC line.   We strive to give you quality time with your provider. You may need to reschedule your appointment if you arrive late (15 or more minutes).  Arriving late affects you and other patients whose appointments are after yours.  Also, if you miss three or more appointments without notifying the office, you may be dismissed from the clinic at the provider's discretion.      For prescription refill requests, have your pharmacy contact our office and allow 72 hours for refills to be completed.    Today you received the following chemotherapy and/or immunotherapy agents IRINOTECAN, LEUCOVORIN, 5 FU      To help prevent nausea and vomiting after your treatment, we encourage you to take your nausea medication as directed.  BELOW ARE SYMPTOMS THAT SHOULD BE REPORTED IMMEDIATELY: *FEVER GREATER THAN 100.4 F (38 C) OR HIGHER *CHILLS OR SWEATING *NAUSEA AND VOMITING THAT IS NOT CONTROLLED WITH YOUR NAUSEA MEDICATION *UNUSUAL SHORTNESS OF BREATH *UNUSUAL BRUISING OR BLEEDING *URINARY PROBLEMS (pain or burning when urinating, or frequent urination) *BOWEL PROBLEMS (unusual diarrhea, constipation, pain near the anus) TENDERNESS IN MOUTH AND THROAT WITH OR WITHOUT PRESENCE OF ULCERS (sore throat, sores in mouth, or a toothache) UNUSUAL RASH, SWELLING OR PAIN  UNUSUAL VAGINAL DISCHARGE OR ITCHING   Items with * indicate a potential emergency and should be followed up as soon as possible or go to the Emergency Department if any problems should occur.  Please show the CHEMOTHERAPY ALERT CARD or IMMUNOTHERAPY ALERT  CARD at check-in to the Emergency Department and triage nurse.  Should you have questions after your visit or need to cancel or reschedule your appointment, please contact [facility]  [phone] and follow the prompts.  Office hours are 8:00 a.m. to 4:30 p.m. Monday - Friday. Please note that voicemails left after 4:00 p.m. may not be returned until the following business day.  We are closed weekends and major holidays. You have access to a nurse at all times for urgent questions. Please call the main number to the clinic [phone] and follow the prompts.  For any non-urgent questions, you may also contact your provider using MyChart. We now offer e-Visits for anyone 17 and older to request care online for non-urgent symptoms. For details visit [email or web address].   Also download the MyChart app! Go to the app store, search "MyChart", open the app, select [place], and log in with your MyChart username and password.  Irinotecan Injection What is this medication? IRINOTECAN (ir in oh TEE kan) treats some types of cancer. It works by slowing down the growth of cancer cells. This medicine may be used for other purposes; ask your health care provider or pharmacist if you have questions. COMMON BRAND NAME(S): Camptosar What should I tell my care team before I take this medication? They need to know if you have any of these conditions: Dehydration Diarrhea Infection, especially a viral infection, such as chickenpox, cold sores, herpes Liver disease Low blood cell levels (white cells, red cells, and platelets) Low levels of electrolytes, such as calcium, magnesium, or potassium in your blood  Recent or ongoing radiation An unusual or allergic reaction to irinotecan, other medications, foods, dyes, or preservatives If you or your partner are pregnant or trying to get pregnant Breast-feeding How should I use this medication? This medication is injected  into a vein. It is given by your care team in a hospital or clinic setting. Talk to your care team about the use of this medication in children. Special care may be needed. Overdosage: If you think you have taken too much of this medicine contact a poison control center or emergency room at once. NOTE: This medicine is only for you. Do not share this medicine with others. What if I miss a dose? Keep appointments for follow-up doses. It is important not to miss your dose. Call your care team if you are unable to keep an appointment. What may interact with this medication? Do not take this medication with any of the following: Cobicistat Itraconazole This medication may also interact with the following: Certain antibiotics, such as clarithromycin, rifampin, rifabutin Certain antivirals for HIV or AIDS Certain medications for fungal infections, such as ketoconazole, posaconazole, voriconazole Certain medications for seizures, such as carbamazepine, phenobarbital, phenytoin Gemfibrozil Nefazodone St. John's wort This list may not describe all possible interactions. Give your health care provider a list of all the medicines, herbs, non-prescription drugs, or dietary supplements you use. Also tell them if you smoke, drink alcohol, or use illegal drugs. Some items may interact with your medicine. What should I watch for while using this medication? Your condition will be monitored carefully while you are receiving this medication. You may need blood work while taking this medication. This medication may make you feel generally unwell. This is not uncommon as chemotherapy can affect healthy cells as well as cancer cells. Report any side effects. Continue your course of treatment even though you feel ill unless your care team tells you to stop. This medication can cause serious side effects. To reduce the risk, your care team may give you other medications to take before receiving this one. Be sure to  follow the directions from your care team. This medication may affect your coordination, reaction time, or judgement. Do not drive or operate machinery until you know how this medication affects you. Sit up or stand slowly to reduce the risk of dizzy or fainting spells. Drinking alcohol with this medication can increase the risk of these side effects. This medication may increase your risk of getting an infection. Call your care team for advice if you get a fever, chills, sore throat, or other symptoms of a cold or flu. Do not treat yourself. Try to avoid being around people who are sick. Avoid taking medications that contain aspirin, acetaminophen, ibuprofen, naproxen, or ketoprofen unless instructed by your care team. These medications may hide a fever. This medication may increase your risk to bruise or bleed. Call your care team if you notice any unusual bleeding. Be careful brushing or flossing your teeth or using a toothpick because you may get an infection or bleed more easily. If you have any dental work done, tell your dentist you are receiving this medication. Talk to your care team if you or your partner are pregnant or think either of you might be pregnant. This medication can cause serious birth defects if taken during pregnancy and for 6 months after the last dose. You will need a negative pregnancy test before starting this medication. Contraception is recommended while taking this medication and for 6 months after the  last dose. Your care team can help you find the option that works for you. Do not father a child while taking this medication and for 3 months after the last dose. Use a condom for contraception during this time period. Do not breastfeed while taking this medication and for 7 days after the last dose. This medication may cause infertility. Talk to your care team if you are concerned about your fertility. What side effects may I notice from receiving this medication? Side  effects that you should report to your care team as soon as possible: Allergic reactions--skin rash, itching, hives, swelling of the face, lips, tongue, or throat Dry cough, shortness of breath or trouble breathing Increased saliva or tears, increased sweating, stomach cramping, diarrhea, small pupils, unusual weakness or fatigue, slow heartbeat Infection--fever, chills, cough, sore throat, wounds that don't heal, pain or trouble when passing urine, general feeling of discomfort or being unwell Kidney injury--decrease in the amount of urine, swelling of the ankles, hands, or feet Low red blood cell level--unusual weakness or fatigue, dizziness, headache, trouble breathing Severe or prolonged diarrhea Unusual bruising or bleeding Side effects that usually do not require medical attention (report to your care team if they continue or are bothersome): Constipation Diarrhea Hair loss Loss of appetite Nausea Stomach pain This list may not describe all possible side effects. Call your doctor for medical advice about side effects. You may report side effects to FDA at 1-800-FDA-1088. Where should I keep my medication? This medication is given in a hospital or clinic. It will not be stored at home. NOTE: This sheet is a summary. It may not cover all possible information. If you have questions about this medicine, talk to your doctor, pharmacist, or health care provider.  2024 Elsevier/Gold Standard (2022-03-31 00:00:00)  Leucovorin Injection What is this medication? LEUCOVORIN (loo koe VOR in) prevents side effects from certain medications, such as methotrexate. It works by increasing folate levels. This helps protect healthy cells in your body. It may also be used to treat anemia caused by low levels of folate. It can also be used with fluorouracil, a type of chemotherapy, to treat colorectal cancer. It works by increasing the effects of fluorouracil in the body. This medicine may be used for  other purposes; ask your health care provider or pharmacist if you have questions. What should I tell my care team before I take this medication? They need to know if you have any of these conditions: Anemia from low levels of vitamin B12 in the blood An unusual or allergic reaction to leucovorin, folic acid, other medications, foods, dyes, or preservatives Pregnant or trying to get pregnant Breastfeeding How should I use this medication? This medication is injected into a vein or a muscle. It is given by your care team in a hospital or clinic setting. Talk to your care team about the use of this medication in children. Special care may be needed. Overdosage: If you think you have taken too much of this medicine contact a poison control center or emergency room at once. NOTE: This medicine is only for you. Do not share this medicine with others. What if I miss a dose? Keep appointments for follow-up doses. It is important not to miss your dose. Call your care team if you are unable to keep an appointment. What may interact with this medication? Capecitabine Fluorouracil Phenobarbital Phenytoin Primidone Trimethoprim;sulfamethoxazole This list may not describe all possible interactions. Give your health care provider a list of all the  medicines, herbs, non-prescription drugs, or dietary supplements you use. Also tell them if you smoke, drink alcohol, or use illegal drugs. Some items may interact with your medicine. What should I watch for while using this medication? Your condition will be monitored carefully while you are receiving this medication. This medication may increase the side effects of 5-fluorouracil. Tell your care team if you have diarrhea or mouth sores that do not get better or that get worse. What side effects may I notice from receiving this medication? Side effects that you should report to your care team as soon as possible: Allergic reactions--skin rash, itching, hives,  swelling of the face, lips, tongue, or throat This list may not describe all possible side effects. Call your doctor for medical advice about side effects. You may report side effects to FDA at 1-800-FDA-1088. Where should I keep my medication? This medication is given in a hospital or clinic. It will not be stored at home. NOTE: This sheet is a summary. It may not cover all possible information. If you have questions about this medicine, talk to your doctor, pharmacist, or health care provider.  2024 Elsevier/Gold Standard (2022-04-22 00:00:00)   Fluorouracil Injection What is this medication? FLUOROURACIL (flure oh YOOR a sil) treats some types of cancer. It works by slowing down the growth of cancer cells. This medicine may be used for other purposes; ask your health care provider or pharmacist if you have questions. COMMON BRAND NAME(S): Adrucil What should I tell my care team before I take this medication? They need to know if you have any of these conditions: Blood disorders Dihydropyrimidine dehydrogenase (DPD) deficiency Infection, such as chickenpox, cold sores, herpes Kidney disease Liver disease Poor nutrition Recent or ongoing radiation therapy An unusual or allergic reaction to fluorouracil, other medications, foods, dyes, or preservatives If you or your partner are pregnant or trying to get pregnant Breast-feeding How should I use this medication? This medication is injected into a vein. It is administered by your care team in a hospital or clinic setting. Talk to your care team about the use of this medication in children. Special care may be needed. Overdosage: If you think you have taken too much of this medicine contact a poison control center or emergency room at once. NOTE: This medicine is only for you. Do not share this medicine with others. What if I miss a dose? Keep appointments for follow-up doses. It is important not to miss your dose. Call your care team if  you are unable to keep an appointment. What may interact with this medication? Do not take this medication with any of the following: Live virus vaccines This medication may also interact with the following: Medications that treat or prevent blood clots, such as warfarin, enoxaparin, dalteparin This list may not describe all possible interactions. Give your health care provider a list of all the medicines, herbs, non-prescription drugs, or dietary supplements you use. Also tell them if you smoke, drink alcohol, or use illegal drugs. Some items may interact with your medicine. What should I watch for while using this medication? Your condition will be monitored carefully while you are receiving this medication. This medication may make you feel generally unwell. This is not uncommon as chemotherapy can affect healthy cells as well as cancer cells. Report any side effects. Continue your course of treatment even though you feel ill unless your care team tells you to stop. In some cases, you may be given additional medications to help with  side effects. Follow all directions for their use. This medication may increase your risk of getting an infection. Call your care team for advice if you get a fever, chills, sore throat, or other symptoms of a cold or flu. Do not treat yourself. Try to avoid being around people who are sick. This medication may increase your risk to bruise or bleed. Call your care team if you notice any unusual bleeding. Be careful brushing or flossing your teeth or using a toothpick because you may get an infection or bleed more easily. If you have any dental work done, tell your dentist you are receiving this medication. Avoid taking medications that contain aspirin, acetaminophen, ibuprofen, naproxen, or ketoprofen unless instructed by your care team. These medications may hide a fever. Do not treat diarrhea with over the counter products. Contact your care team if you have diarrhea  that lasts more than 2 days or if it is severe and watery. This medication can make you more sensitive to the sun. Keep out of the sun. If you cannot avoid being in the sun, wear protective clothing and sunscreen. Do not use sun lamps, tanning beds, or tanning booths. Talk to your care team if you or your partner wish to become pregnant or think you might be pregnant. This medication can cause serious birth defects if taken during pregnancy and for 3 months after the last dose. A reliable form of contraception is recommended while taking this medication and for 3 months after the last dose. Talk to your care team about effective forms of contraception. Do not father a child while taking this medication and for 3 months after the last dose. Use a condom while having sex during this time period. Do not breastfeed while taking this medication. This medication may cause infertility. Talk to your care team if you are concerned about your fertility. What side effects may I notice from receiving this medication? Side effects that you should report to your care team as soon as possible: Allergic reactions--skin rash, itching, hives, swelling of the face, lips, tongue, or throat Heart attack--pain or tightness in the chest, shoulders, arms, or jaw, nausea, shortness of breath, cold or clammy skin, feeling faint or lightheaded Heart failure--shortness of breath, swelling of the ankles, feet, or hands, sudden weight gain, unusual weakness or fatigue Heart rhythm changes--fast or irregular heartbeat, dizziness, feeling faint or lightheaded, chest pain, trouble breathing High ammonia level--unusual weakness or fatigue, confusion, loss of appetite, nausea, vomiting, seizures Infection--fever, chills, cough, sore throat, wounds that don't heal, pain or trouble when passing urine, general feeling of discomfort or being unwell Low red blood cell level--unusual weakness or fatigue, dizziness, headache, trouble  breathing Pain, tingling, or numbness in the hands or feet, muscle weakness, change in vision, confusion or trouble speaking, loss of balance or coordination, trouble walking, seizures Redness, swelling, and blistering of the skin over hands and feet Severe or prolonged diarrhea Unusual bruising or bleeding Side effects that usually do not require medical attention (report to your care team if they continue or are bothersome): Dry skin Headache Increased tears Nausea Pain, redness, or swelling with sores inside the mouth or throat Sensitivity to light Vomiting This list may not describe all possible side effects. Call your doctor for medical advice about side effects. You may report side effects to FDA at 1-800-FDA-1088. Where should I keep my medication? This medication is given in a hospital or clinic. It will not be stored at home. NOTE: This sheet is  a summary. It may not cover all possible information. If you have questions about this medicine, talk to your doctor, pharmacist, or health care provider.  2024 Elsevier/Gold Standard (2022-03-25 00:00:00)

## 2023-06-09 NOTE — Progress Notes (Signed)
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  INTERVAL HISTORY: Patient returns to clinic today for further evaluation and initiation of FOLFIRI.  She continues to have weakness and fatigue, but otherwise feels well.  She has a chronic peripheral neuropathy, but no other neurologic complaints.  She denies any recent fevers or illnesses.  She has no chest pain, shortness of breath, cough, or hemoptysis.  She has no abdominal pain.  She denies any nausea, vomiting, constipation, or diarrhea.  She has no melena or hematochezia.  She has no urinary complaints.  Patient offers no further specific complaints today.  REVIEW OF SYSTEMS:   Review of Systems  Constitutional:  Positive for malaise/fatigue. Negative for fever and weight loss.  Respiratory: Negative.  Negative for cough, hemoptysis and shortness of breath.   Cardiovascular: Negative.  Negative for chest pain and leg swelling.  Gastrointestinal: Negative.  Negative for abdominal pain, blood in stool, constipation, diarrhea, melena, nausea and vomiting.  Genitourinary: Negative.  Negative for dysuria.  Musculoskeletal: Negative.  Negative for back pain and falls.  Skin: Negative.  Negative for rash.  Neurological:  Positive for sensory change and weakness. Negative for dizziness, focal weakness and headaches.  Psychiatric/Behavioral: Negative.  The patient is not nervous/anxious.     As per HPI. Otherwise, a complete review of systems is negative.  PAST MEDICAL HISTORY: Past Medical History:  Diagnosis Date    Anemia    Anxiety    Asthma    Cancer (HCC)    colon   CHF (congestive heart failure) (HCC)    COPD (chronic obstructive pulmonary disease) (HCC)    Diabetes mellitus without complication (HCC)    Hyperlipidemia    Hypertension    Stroke (HCC)     PAST SURGICAL HISTORY: Past Surgical History:  Procedure Laterality Date   COLONOSCOPY WITH PROPOFOL N/A 02/03/2022   Procedure: COLONOSCOPY WITH PROPOFOL;  Surgeon: Toney Reil, MD;  Location: [facility] ENDOSCOPY;  Service: Gastroenterology;  Laterality: N/A;   ESOPHAGOGASTRODUODENOSCOPY (EGD) WITH PROPOFOL N/A 02/03/2022   Procedure: ESOPHAGOGASTRODUODENOSCOPY (EGD) WITH PROPOFOL;  Surgeon: Toney Reil, MD;  Location: [facility] ENDOSCOPY;  Service: Gastroenterology;  Laterality: N/A;   GIVENS CAPSULE STUDY N/A 02/03/2022   Procedure: GIVENS CAPSULE STUDY;  Surgeon: Toney Reil, MD;  Location: [facility] ENDOSCOPY;  Service: Gastroenterology;  Laterality: N/A;  possible Capsule Study   IR IMAGING GUIDED PORT INSERTION  06/18/2022    FAMILY HISTORY: Family History  Problem Relation Age of Onset   Diabetes Mother    Heart attack Father    Colon cancer Father     ADVANCED DIRECTIVES (Y/N):  N  HEALTH MAINTENANCE: Social History   Tobacco Use   Smoking status: Former    Types: E-cigarettes    Quit date: 12/08/2022    Years since quitting: 0.5   Tobacco comments:    Quit Cigarettes 12/2021  Substance Use Topics   Alcohol use: Yes    Comment: rare   Drug use: Never     Colonoscopy:  PAP:  Bone density:  Lipid panel:  Allergies  Allergen Reactions   Codeine Rash  Happened in 1970s    Current Outpatient Medications  Medication Sig Dispense Refill   acetaminophen (TYLENOL) 325 MG tablet Take 650 mg by mouth every 6 (six) hours as needed.     albuterol (VENTOLIN HFA) 108 (90 Base) MCG/ACT inhaler Inhale into the lungs.     atorvastatin (LIPITOR) 40 MG tablet Take 40 mg by mouth daily.     fenofibrate 54 MG tablet  Take 54 mg by mouth daily.     gabapentin (NEURONTIN) 600 MG tablet Take by mouth 2 (two) times daily.     insulin lispro (HUMALOG) 100 UNIT/ML injection Inject 1-10 Units into the skin 3 (three) times daily before meals. Per sliding scale     LANTUS SOLOSTAR 100 UNIT/ML Solostar Pen Inject 12 Units into the skin at bedtime.     lidocaine-prilocaine (EMLA) cream Apply to affected area once 30 g 3   lisinopril (ZESTRIL) 20 MG tablet Take 20 mg by mouth daily.     loperamide (IMODIUM) 2 MG capsule Take 2 tabs by mouth with first loose stool, then 1 tab with each additional loose stool as needed. Do not exceed 8 tabs in a 24-hour period 60 capsule 2   Magnesium Citrate 200 MG TABS Take 1 tablet by mouth in the morning and at bedtime. 250 twice daily     metoprolol succinate (TOPROL-XL) 50 MG 24 hr tablet Take 50 mg by mouth daily. Take with or immediately following a meal.     ranolazine (RANEXA) 500 MG 12 hr tablet Take 500 mg by mouth 2 (two) times daily.     sitaGLIPtin-metformin (JANUMET) 50-1000 MG tablet Take 1 tablet by mouth daily.     venlafaxine XR (EFFEXOR-XR) 75 MG 24 hr capsule Take 75 mg by mouth daily.     pantoprazole (PROTONIX) 40 MG tablet Take 1 tablet (40 mg total) by mouth daily. (Patient not taking: Reported on 01/02/2023) 30 tablet 5   No current facility-administered medications for this visit.   Facility-Administered Medications Ordered in Other Visits  Medication Dose Route Frequency Provider Last Rate Last Admin   fluorouracil (ADRUCIL) 4,000 mg in sodium chloride 0.9 % 70 mL chemo infusion  2,400 mg/m2 (Treatment Plan Recorded) Intravenous 1 day or 1 dose [name], Tollie Pizza, MD       fluorouracil (ADRUCIL) chemo injection 650 mg  400 mg/m2 (Treatment Plan Recorded) Intravenous Once Jeralyn Ruths, MD       irinotecan (CAMPTOSAR) 300 mg in sodium chloride 0.9 % 500 mL chemo infusion  180 mg/m2 (Treatment Plan Recorded) Intravenous Once Jeralyn Ruths, MD 343 mL/hr  at 06/09/23 1155 300 mg at 06/09/23 1155   leucovorin 664 mg in sodium chloride 0.9 % 250 mL infusion  400 mg/m2 (Treatment Plan Recorded) Intravenous Once Jeralyn Ruths, MD 189 mL/hr at 06/09/23 1157 664 mg at 06/09/23 1157    OBJECTIVE: Vitals:   06/09/23 0940  BP: 115/61  Pulse: 65  Resp: 18  Temp: 97.6 F (36.4 C)  SpO2: 95%      Body mass index is 23.1 kg/m.    ECOG FS:1 - Symptomatic but completely ambulatory  General: Well-developed, well-nourished, no acute distress.  Sitting in a wheelchair. Eyes: Pink conjunctiva, anicteric sclera. HEENT: Normocephalic, moist mucous membranes. Lungs: No audible wheezing or coughing. Heart: Regular rate and rhythm. Abdomen: Soft, nontender, no obvious distention. Musculoskeletal: No edema, cyanosis, or clubbing. Neuro: Alert, answering all questions appropriately. Cranial nerves grossly intact. Skin: No rashes or petechiae noted. Psych:  Normal affect.    LAB RESULTS:  Lab Results  Component Value Date   NA 139 06/09/2023   K 4.5 06/09/2023   CL 105 06/09/2023   CO2 27 06/09/2023   GLUCOSE 131 (H) 06/09/2023   BUN 24 (H) 06/09/2023   CREATININE 0.84 06/09/2023   CALCIUM 9.5 06/09/2023   PROT 6.8 06/09/2023   ALBUMIN 3.7 06/09/2023   AST 18 06/09/2023   ALT 10 06/09/2023   ALKPHOS 35 (L) 06/09/2023   BILITOT 0.3 06/09/2023   GFRNONAA >60 06/09/2023    Lab Results  Component Value Date   WBC 5.2 06/09/2023   NEUTROABS 3.9 06/09/2023   HGB 9.9 (L) 06/09/2023   HCT 31.2 (L) 06/09/2023   MCV 92.9 06/09/2023   PLT 199 06/09/2023     STUDIES: NM PET Image Restag (PS) Skull Base To Thigh  Result Date: 05/20/2023 CLINICAL DATA:  Subsequent treatment strategy for transverse colon cancer . EXAM: NUCLEAR MEDICINE PET SKULL BASE TO THIGH TECHNIQUE: 7.6 mCi F-18 FDG was injected intravenously. Full-ring PET imaging was performed from the skull base to thigh after the radiotracer. CT data was obtained and used for  attenuation correction and anatomic localization. Fasting blood glucose: 106 mg/dl COMPARISON:  CT scan 91/47/8295 and prior PET-CT from 02/27/2022 FINDINGS: Mediastinal blood pool activity: SUV max 2.0 Liver activity: SUV max NA NECK: No significant abnormal hypermetabolic activity in this region. Incidental CT findings: None. CHEST: No significant abnormal hypermetabolic activity in this region. Postoperative findings in the superior segment left lower lobe. Incidental CT findings: Right Port-A-Cath tip: Right atrium. Coronary, aortic arch, and branch vessel atherosclerotic vascular disease. Mild cardiomegaly. Centrilobular emphysema. ABDOMEN/PELVIS: 2.5 cm focus of hypermetabolic nodularity along the postoperative findings in the inferior gastric body, maximum SUV 8.4. Recurrent malignancy along the postoperative portion of the stomach is not excluded given this appearance. At the site of the density along the small bowel anastomosis referenced on 04/30/23 CT scan, there is ring-like accentuated activity corresponding to the margins of the hypodense lesion, with maximum SUV of 5.0. Although this is substantially less active than some of the physiologic activity in bowel, recurrence at this site is not confidently excluded. In the vicinity of the masslike nodularity in the central pelvis along small bowel loops, we demonstrate a 3.4 cm focus of hypermetabolic activity, maximum SUV 17.4, suspicious for malignancy. The associated mass is shown on image 132 of series 4. Scattered multifocal bowel activity, likely physiologic. No hepatic metastatic lesion observed. Accentuated activity along the mesh along the superficial fascia margin, benign. Incidental CT findings: Atherosclerosis is present, including aortoiliac atherosclerotic disease. 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 7 mm gallstone in the gallbladder. Sigmoid colon  diverticulosis. SKELETON: No significant abnormal hypermetabolic activity in this region. Incidental CT findings: Lumbar spondylosis and degenerative disc disease. Lower cervical spondylosis. IMPRESSION: 1. Hypermetabolic activity along the postoperative findings in the inferior gastric body, suspicious for recurrent malignancy, maximum SUV 8.4. 2. Rim hypermetabolic activity along the small bowel anastomosis in the central pelvis, suspicious for recurrence, maximum SUV 5.0. 3. Focal hypermetabolic activity along small bowel loops in the lower pelvis corresponding to nodularity seen in this vicinity on recent diagnostic CT, suspicious for a metastatic lesion along the small bowel, maximum SUV 17.4. 4. Aortic and coronary atherosclerosis. 5. Left adrenal adenoma and superimposed left adrenal myelolipoma, benign and not requiring further imaging follow up. 6. Cholelithiasis. 7. Sigmoid colon diverticulosis. 8. Lumbar  spondylosis and degenerative disc disease. 9. Emphysema. Aortic Atherosclerosis (ICD10-I70.0) and Emphysema (ICD10-J43.9). Electronically Signed   By: Gaylyn Rong M.D.   On: 05/20/2023 14:42    ASSESSMENT: Stage IIIc colon cancer.  PLAN:    Stage IIIc colon cancer: Patient underwent complete surgical resection at [facility] on May 23, 2022.  Large colon mass was noted to partially invade stomach as well as surrounding mesentery.  PET scan did not reveal any metastatic disease other than a positive lung nodule which was confirmed to be a second primary.  Patient completed her final cycle of adjuvant FOLFOX on December 23, 2022.  Patient's preop CEA was 171.  Recently, CEA has increased from 18.6 to 181.0.  Patient's most recent CT scan reviewed independently and reported as above highly suspicious of progressive disease.  PET scan results from May 20, 2023 reviewed independently and reported as above with clear progression of disease.  Patient will have an appointment with [facility] for possible surgical resection at the end of the month.  Case discussed with their team and they would like to do a colonoscopy and EGD in the near future.  Will hold Avastin.  Proceed with cycle 1 of FOLFIRI today.  Return to clinic in 2 days for pump removal and then in 2 weeks for further evaluation and consideration of cycle 2.   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  Patient completed XRT in May 2023.  PET scan as above with no obvious evidence of recurrence.   Pain: Resolved. Anemia: Hemoglobin is trended down to 9.9, monitor.   Neuropathy/falls: Patient was previously given a referral to Occupational Therapy.  I spent a total of 30 minutes reviewing chart data, face-to-face evaluation with the patient, counseling and coordination of care as detailed above.   Patient expressed understanding and was in agreement with this plan. She also understands that She can call clinic at any time with any questions, concerns, or complaints.    Cancer Staging  Colon cancer [facility]) Staging form: Colon and Rectum, AJCC 8th Edition - Pathologic stage from 06/10/2022: Stage IIIC (pT4b, pN1c, cM0) - Signed by Jeralyn Ruths, MD on 06/10/2022 Stage prefix: Initial diagnosis Total positive nodes: 0 Histologic grading system: 4 grade system Histologic grade (G): G3   Jeralyn Ruths, MD   06/09/2023 1:09 PM

## 2023-06-10 LAB — CEA: CEA: 152 ng/mL — ABNORMAL HIGH (ref 0.0–4.7)

## 2023-06-11 ENCOUNTER — Inpatient Hospital Stay: Payer: Medicare Other

## 2023-06-11 ENCOUNTER — Encounter: Payer: Self-pay | Admitting: Oncology

## 2023-06-11 VITALS — BP 136/72 | HR 70 | Resp 18

## 2023-06-11 DIAGNOSIS — C184 Malignant neoplasm of transverse colon: Secondary | ICD-10-CM

## 2023-06-11 MED ORDER — SODIUM CHLORIDE 0.9% FLUSH
10.0000 mL | INTRAVENOUS | Status: DC | PRN
  Filled 2023-06-11: qty 10

## 2023-06-11 MED ORDER — HEPARIN SOD (PORK) LOCK FLUSH 100 UNIT/ML IV SOLN
500.0000 [IU] | Freq: Once | INTRAVENOUS | Status: DC | PRN
  Filled 2023-06-11: qty 5

## 2023-06-15 ENCOUNTER — Other Ambulatory Visit: Payer: Medicare Other

## 2023-06-16 ENCOUNTER — Ambulatory Visit: Payer: Medicare Other | Admitting: Oncology

## 2023-06-17 ENCOUNTER — Telehealth: Payer: Self-pay

## 2023-06-17 NOTE — Telephone Encounter (Signed)
Call placed to Jessica Stout to arrange EGD with biopsy and colonoscopy. She is aware that her surgery team at [facility] is requesting. I have sent a message to [facility] to arrange.

## 2023-06-19 ENCOUNTER — Telehealth: Payer: Self-pay

## 2023-06-19 NOTE — Telephone Encounter (Signed)
Received call from Mr. Daidone with confusion regarding upcoming appointments. He thinks she has an appointment today at 1030 but is not sure. After further chart review [facility] did reach out to her to arrange an appointment for today with plan for EGD/colonoscopy on 7/26. He did not wake her up this morning and will not be able to arrive on time. I have called [facility] and they are unable to accommodate at any other time today. They offered her 7/23 at 1500. I have contacted Mr. Cranfield and he will be able to take her to the appointment following chemo. He is aware this appointment is to go over EGD/colonoscopy instructions and see clinician since she has not been seen in a year. Encouraged them to call with any further needs.

## 2023-06-22 ENCOUNTER — Encounter: Payer: Self-pay | Admitting: Oncology

## 2023-06-22 MED FILL — Dexamethasone Sodium Phosphate Inj 100 MG/10ML: INTRAMUSCULAR | Qty: 1 | Status: AC

## 2023-06-23 ENCOUNTER — Inpatient Hospital Stay: Payer: Medicare Other

## 2023-06-23 ENCOUNTER — Other Ambulatory Visit: Payer: Self-pay

## 2023-06-23 ENCOUNTER — Inpatient Hospital Stay (HOSPITAL_BASED_OUTPATIENT_CLINIC_OR_DEPARTMENT_OTHER): Payer: Medicare Other | Admitting: Oncology

## 2023-06-23 ENCOUNTER — Encounter: Payer: Self-pay | Admitting: Oncology

## 2023-06-23 VITALS — Temp 97.5°F

## 2023-06-23 DIAGNOSIS — C184 Malignant neoplasm of transverse colon: Secondary | ICD-10-CM | POA: Diagnosis not present

## 2023-06-23 DIAGNOSIS — Z5111 Encounter for antineoplastic chemotherapy: Secondary | ICD-10-CM | POA: Diagnosis not present

## 2023-06-23 LAB — CMP (CANCER CENTER ONLY)
ALT: 12 U/L (ref 0–44)
AST: 16 U/L (ref 15–41)
Albumin: 3.6 g/dL (ref 3.5–5.0)
Alkaline Phosphatase: 31 U/L — ABNORMAL LOW (ref 38–126)
Anion gap: 7 (ref 5–15)
BUN: 23 mg/dL (ref 8–23)
CO2: 27 mmol/L (ref 22–32)
Calcium: 8.7 mg/dL — ABNORMAL LOW (ref 8.9–10.3)
Chloride: 104 mmol/L (ref 98–111)
Creatinine: 0.81 mg/dL (ref 0.44–1.00)
GFR, Estimated: >60 mL/min (ref >60)
Glucose, Bld: 119 mg/dL — ABNORMAL HIGH (ref 70–99)
Potassium: 3.9 mmol/L (ref 3.5–5.1)
Sodium: 138 mmol/L (ref 135–145)
Total Bilirubin: 0.5 mg/dL (ref 0.3–1.2)
Total Protein: 6.6 g/dL (ref 6.5–8.1)

## 2023-06-23 LAB — CBC WITH DIFFERENTIAL (CANCER CENTER ONLY)
Abs Immature Granulocytes: 0 10*3/uL (ref 0.00–0.07)
Basophils Absolute: 0 10*3/uL (ref 0.0–0.1)
Basophils Relative: 0 %
Eosinophils Absolute: 0.1 10*3/uL (ref 0.0–0.5)
Eosinophils Relative: 3 %
HCT: 28.3 % — ABNORMAL LOW (ref 36.0–46.0)
Hemoglobin: 9 g/dL — ABNORMAL LOW (ref 12.0–15.0)
Immature Granulocytes: 0 %
Lymphocytes Relative: 20 %
Lymphs Abs: 0.6 10*3/uL — ABNORMAL LOW (ref 0.7–4.0)
MCH: 29.5 pg (ref 26.0–34.0)
MCHC: 31.8 g/dL (ref 30.0–36.0)
MCV: 92.8 fL (ref 80.0–100.0)
Monocytes Absolute: 0.3 10*3/uL (ref 0.1–1.0)
Monocytes Relative: 10 %
Neutro Abs: 2 10*3/uL (ref 1.7–7.7)
Neutrophils Relative %: 67 %
Platelet Count: 167 10*3/uL (ref 150–400)
RBC: 3.05 MIL/uL — ABNORMAL LOW (ref 3.87–5.11)
RDW: 14.9 % (ref 11.5–15.5)
WBC Count: 3 10*3/uL — ABNORMAL LOW (ref 4.0–10.5)
nRBC: 0 % (ref 0.0–0.2)

## 2023-06-23 LAB — MAGNESIUM: Magnesium: 1.7 mg/dL (ref 1.7–2.4)

## 2023-06-23 MED ORDER — SODIUM CHLORIDE 0.9 % IV SOLN
2400.0000 mg/m2 | INTRAVENOUS | Status: DC
  Administered 2023-06-23: 4000 mg via INTRAVENOUS
  Filled 2023-06-23: qty 80

## 2023-06-23 MED ORDER — SODIUM CHLORIDE 0.9 % IV SOLN
400.0000 mg/m2 | Freq: Once | INTRAVENOUS | Status: AC
  Administered 2023-06-23: 664 mg via INTRAVENOUS
  Filled 2023-06-23: qty 33.2

## 2023-06-23 MED ORDER — FLUOROURACIL CHEMO INJECTION 2.5 GM/50ML
400.0000 mg/m2 | Freq: Once | INTRAVENOUS | Status: AC
  Administered 2023-06-23: 650 mg via INTRAVENOUS
  Filled 2023-06-23: qty 13

## 2023-06-23 MED ORDER — ATROPINE SULFATE 1 MG/ML IV SOLN
0.5000 mg | Freq: Once | INTRAVENOUS | Status: AC | PRN
  Administered 2023-06-23: 0.5 mg via INTRAVENOUS

## 2023-06-23 MED ORDER — SODIUM CHLORIDE 0.9 % IV SOLN
10.0000 mg | Freq: Once | INTRAVENOUS | Status: AC
  Administered 2023-06-23: 10 mg via INTRAVENOUS
  Filled 2023-06-23: qty 10

## 2023-06-23 MED ORDER — SODIUM CHLORIDE 0.9% FLUSH
10.0000 mL | Freq: Once | INTRAVENOUS | Status: AC
  Administered 2023-06-23: 10 mL via INTRAVENOUS
  Filled 2023-06-23: qty 10

## 2023-06-23 MED ORDER — SODIUM CHLORIDE 0.9 % IV SOLN
180.0000 mg/m2 | Freq: Once | INTRAVENOUS | Status: AC
  Administered 2023-06-23: 300 mg via INTRAVENOUS
  Filled 2023-06-23: qty 15

## 2023-06-23 MED ORDER — PALONOSETRON HCL INJECTION 0.25 MG/5ML
0.2500 mg | Freq: Once | INTRAVENOUS | Status: AC
  Administered 2023-06-23: 0.25 mg via INTRAVENOUS
  Filled 2023-06-23: qty 5

## 2023-06-23 MED ORDER — SODIUM CHLORIDE 0.9 % IV SOLN
Freq: Once | INTRAVENOUS | Status: AC
  Filled 2023-06-23: qty 250

## 2023-06-23 NOTE — Progress Notes (Signed)
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  INTERVAL HISTORY: Patient returns to clinic today for further evaluation and consideration of cycle 2 of FOLFIRI.  She has chronic weakness and fatigue, but otherwise feels well.  She tolerated her first treatment without significant side effects. She has a chronic peripheral neuropathy, but no other neurologic complaints.  She denies any recent fevers or illnesses.  She has no chest pain, shortness of breath, cough, or hemoptysis.  She has no abdominal pain.  She denies any nausea, vomiting, constipation, or diarrhea.  She has no melena or hematochezia.  She has no urinary complaints.  Patient offers no further specific complaints today.  REVIEW OF SYSTEMS:   Review of Systems  Constitutional:  Positive for malaise/fatigue. Negative for fever and weight loss.  Respiratory: Negative.  Negative for cough, hemoptysis and shortness of breath.   Cardiovascular: Negative.  Negative for chest pain and leg swelling.  Gastrointestinal: Negative.  Negative for abdominal pain, blood in stool, constipation, diarrhea, melena, nausea and vomiting.  Genitourinary: Negative.  Negative for dysuria.  Musculoskeletal: Negative.  Negative for back pain and falls.  Skin: Negative.  Negative for rash.  Neurological:  Positive for sensory change and weakness. Negative for dizziness, focal weakness and headaches.  Psychiatric/Behavioral: Negative.  The patient is not nervous/anxious.     As per HPI. Otherwise, a complete review of systems is  negative.  PAST MEDICAL HISTORY: Past Medical History:  Diagnosis Date   Anemia    Anxiety    Asthma    Cancer (HCC)    colon   CHF (congestive heart failure) (HCC)    COPD (chronic obstructive pulmonary disease) (HCC)    Diabetes mellitus without complication (HCC)    Hyperlipidemia    Hypertension    Stroke (HCC)     PAST SURGICAL HISTORY: Past Surgical History:  Procedure Laterality Date   COLONOSCOPY WITH PROPOFOL N/A 02/03/2022   Procedure: COLONOSCOPY WITH PROPOFOL;  Surgeon: Toney Reil, MD;  Location: [facility] ENDOSCOPY;  Service: Gastroenterology;  Laterality: N/A;   ESOPHAGOGASTRODUODENOSCOPY (EGD) WITH PROPOFOL N/A 02/03/2022   Procedure: ESOPHAGOGASTRODUODENOSCOPY (EGD) WITH PROPOFOL;  Surgeon: Toney Reil, MD;  Location: [facility] ENDOSCOPY;  Service: Gastroenterology;  Laterality: N/A;   GIVENS CAPSULE STUDY N/A 02/03/2022   Procedure: GIVENS CAPSULE STUDY;  Surgeon: Toney Reil, MD;  Location: [facility] ENDOSCOPY;  Service: Gastroenterology;  Laterality: N/A;  possible Capsule Study   IR IMAGING GUIDED PORT INSERTION  06/18/2022    FAMILY HISTORY: Family History  Problem Relation Age of Onset   Diabetes Mother    Heart attack Father    Colon cancer Father     ADVANCED DIRECTIVES (Y/N):  N  HEALTH MAINTENANCE: Social History   Tobacco Use   Smoking status: Former    Types: E-cigarettes    Quit date: 12/08/2022    Years since quitting: 0.5   Tobacco comments:    Quit Cigarettes 12/2021  Substance Use Topics   Alcohol use: Yes    Comment: rare   Drug use: Never     Colonoscopy:  PAP:  Bone density:  Lipid  panel:  Allergies  Allergen Reactions   Codeine Rash    Happened in 1970s    Current Outpatient Medications  Medication Sig Dispense Refill   acetaminophen (TYLENOL) 325 MG tablet Take 650 mg by mouth every 6 (six) hours as needed.     albuterol (VENTOLIN HFA) 108 (90 Base) MCG/ACT inhaler Inhale into the lungs.     atorvastatin  (LIPITOR) 40 MG tablet Take 40 mg by mouth daily.     fenofibrate 54 MG tablet Take 54 mg by mouth daily.     gabapentin (NEURONTIN) 600 MG tablet Take by mouth 2 (two) times daily.     insulin lispro (HUMALOG) 100 UNIT/ML injection Inject 1-10 Units into the skin 3 (three) times daily before meals. Per sliding scale     LANTUS SOLOSTAR 100 UNIT/ML Solostar Pen Inject 12 Units into the skin at bedtime.     lidocaine-prilocaine (EMLA) cream Apply to affected area once 30 g 3   lisinopril (ZESTRIL) 20 MG tablet Take 20 mg by mouth daily.     loperamide (IMODIUM) 2 MG capsule Take 2 tabs by mouth with first loose stool, then 1 tab with each additional loose stool as needed. Do not exceed 8 tabs in a 24-hour period 60 capsule 2   Magnesium Citrate 200 MG TABS Take 1 tablet by mouth in the morning and at bedtime. 250 twice daily     metoprolol succinate (TOPROL-XL) 50 MG 24 hr tablet Take 50 mg by mouth daily. Take with or immediately following a meal.     ranolazine (RANEXA) 500 MG 12 hr tablet Take 500 mg by mouth 2 (two) times daily.     sitaGLIPtin-metformin (JANUMET) 50-1000 MG tablet Take 1 tablet by mouth daily.     venlafaxine XR (EFFEXOR-XR) 75 MG 24 hr capsule Take 75 mg by mouth daily.     pantoprazole (PROTONIX) 40 MG tablet Take 1 tablet (40 mg total) by mouth daily. (Patient not taking: Reported on 01/02/2023) 30 tablet 5   No current facility-administered medications for this visit.   Facility-Administered Medications Ordered in Other Visits  Medication Dose Route Frequency Provider Last Rate Last Admin   fluorouracil (ADRUCIL) 4,000 mg in sodium chloride 0.9 % 70 mL chemo infusion  2,400 mg/m2 (Treatment Plan Recorded) Intravenous 1 day or 1 dose [name], Tollie Pizza, MD       fluorouracil (ADRUCIL) chemo injection 650 mg  400 mg/m2 (Treatment Plan Recorded) Intravenous Once Jeralyn Ruths, MD       irinotecan (CAMPTOSAR) 300 mg in sodium chloride 0.9 % 500 mL chemo infusion  180  mg/m2 (Treatment Plan Recorded) Intravenous Once Jeralyn Ruths, MD       leucovorin 664 mg in sodium chloride 0.9 % 250 mL infusion  400 mg/m2 (Treatment Plan Recorded) Intravenous Once Jeralyn Ruths, MD 189 mL/hr at 06/23/23 1041 664 mg at 06/23/23 1041    OBJECTIVE: Vitals:   06/23/23 0855  BP: (!) 112/58  Pulse: 86  Resp: 18  SpO2: 97%      Body mass index is 23.17 kg/m.    ECOG FS:1 - Symptomatic but completely ambulatory  General: Well-developed, well-nourished, no acute distress.  Sitting in a wheelchair. Eyes: Pink conjunctiva, anicteric sclera. HEENT: Normocephalic, moist mucous membranes. Lungs: No audible wheezing or coughing. Heart: Regular rate and rhythm. Abdomen: Soft, nontender, no obvious distention. Musculoskeletal: No edema, cyanosis, or clubbing. Neuro: Alert, answering all questions appropriately. Cranial nerves grossly intact. Skin: No rashes or petechiae  noted. Psych: Normal affect.  LAB RESULTS:  Lab Results  Component Value Date   NA 138 06/23/2023   K 3.9 06/23/2023   CL 104 06/23/2023   CO2 27 06/23/2023   GLUCOSE 119 (H) 06/23/2023   BUN 23 06/23/2023   CREATININE 0.81 06/23/2023   CALCIUM 8.7 (L) 06/23/2023   PROT 6.6 06/23/2023   ALBUMIN 3.6 06/23/2023   AST 16 06/23/2023   ALT 12 06/23/2023   ALKPHOS 31 (L) 06/23/2023   BILITOT 0.5 06/23/2023   GFRNONAA >60 06/23/2023    Lab Results  Component Value Date   WBC 3.0 (L) 06/23/2023   NEUTROABS 2.0 06/23/2023   HGB 9.0 (L) 06/23/2023   HCT 28.3 (L) 06/23/2023   MCV 92.8 06/23/2023   PLT 167 06/23/2023     STUDIES: No results found.  ASSESSMENT: Stage IIIc colon cancer.  PLAN:    Stage IIIc colon cancer: Patient underwent complete surgical resection at [facility] on May 23, 2022.  Large colon mass was noted to partially invade stomach as well as surrounding mesentery.  PET scan did not reveal any metastatic disease other than a positive lung nodule which was  confirmed to be a second primary.  Patient completed her final cycle of adjuvant FOLFOX on December 23, 2022.  Patient's preop CEA was 171.  Recently, CEA has increased from 18.6 to 181.0.  Patient's most recent CT scan reviewed independently and reported as above highly suspicious of progressive disease.  PET scan results from May 20, 2023 reviewed independently and reported as above with clear progression of disease.  Patient will have an appointment with [facility] for possible surgical resection at the end of the month.  Colonoscopy and EGD are scheduled for this Friday.  Proceed with cycle 2 of FOLFIRI today.  Continue to hold Avastin.  Return to clinic in 2 days for pump removal and then in 2 weeks for further evaluation and consideration of cycle 3.   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  Patient completed XRT in May 2023.  PET scan as above with no obvious evidence of recurrence.   Pain: Resolved. Anemia: Hemoglobin continues to slowly trend down and is now 9.0, monitor. Leukopenia: Mild, monitor. Neuropathy/falls: Patient does not complain of this today.  Patient was previously given a referral to Occupational Therapy.   Patient expressed understanding and was in agreement with this plan. She also understands that She can call clinic at any time with any questions, concerns, or complaints.    Cancer Staging  Colon cancer [facility]) Staging form: Colon and Rectum, AJCC 8th Edition - Pathologic stage from 06/10/2022: Stage IIIC (pT4b, pN1c, cM0) - Signed by Jeralyn Ruths, MD on 06/10/2022 Stage prefix: Initial diagnosis Total positive nodes: 0 Histologic grading system: 4 grade system Histologic grade (G): G3   Jeralyn Ruths, MD   06/23/2023 10:42 AM

## 2023-06-23 NOTE — Patient Instructions (Signed)
[facility]  Discharge Instructions: Thank you for choosing [facility] to provide your oncology and hematology care.  If you have a lab appointment with the Cancer Center, please go directly to the Cancer Center and check in at the registration area.  Wear comfortable clothing and clothing appropriate for easy access to any Portacath or PICC line.   We strive to give you quality time with your provider. You may need to reschedule your appointment if you arrive late (15 or more minutes).  Arriving late affects you and other patients whose appointments are after yours.  Also, if you miss three or more appointments without notifying the office, you may be dismissed from the clinic at the provider's discretion.      For prescription refill requests, have your pharmacy contact our office and allow 72 hours for refills to be completed.    Today you received the following chemotherapy and/or immunotherapy agents IRINOTECAN, LEUCOVORIN, 5 FU      To help prevent nausea and vomiting after your treatment, we encourage you to take your nausea medication as directed.  BELOW ARE SYMPTOMS THAT SHOULD BE REPORTED IMMEDIATELY: *FEVER GREATER THAN 100.4 F (38 C) OR HIGHER *CHILLS OR SWEATING *NAUSEA AND VOMITING THAT IS NOT CONTROLLED WITH YOUR NAUSEA MEDICATION *UNUSUAL SHORTNESS OF BREATH *UNUSUAL BRUISING OR BLEEDING *URINARY PROBLEMS (pain or burning when urinating, or frequent urination) *BOWEL PROBLEMS (unusual diarrhea, constipation, pain near the anus) TENDERNESS IN MOUTH AND THROAT WITH OR WITHOUT PRESENCE OF ULCERS (sore throat, sores in mouth, or a toothache) UNUSUAL RASH, SWELLING OR PAIN  UNUSUAL VAGINAL DISCHARGE OR ITCHING   Items with * indicate a potential emergency and should be followed up as soon as possible or go to the Emergency Department if any problems should occur.  Please show the CHEMOTHERAPY ALERT CARD or IMMUNOTHERAPY ALERT  CARD at check-in to the Emergency Department and triage nurse.  Should you have questions after your visit or need to cancel or reschedule your appointment, please contact [facility]  [phone] and follow the prompts.  Office hours are 8:00 a.m. to 4:30 p.m. Monday - Friday. Please note that voicemails left after 4:00 p.m. may not be returned until the following business day.  We are closed weekends and major holidays. You have access to a nurse at all times for urgent questions. Please call the main number to the clinic [phone] and follow the prompts.  For any non-urgent questions, you may also contact your provider using MyChart. We now offer e-Visits for anyone 17 and older to request care online for non-urgent symptoms. For details visit [email or web address].   Also download the MyChart app! Go to the app store, search "MyChart", open the app, select [place], and log in with your MyChart username and password.  Irinotecan Injection What is this medication? IRINOTECAN (ir in oh TEE kan) treats some types of cancer. It works by slowing down the growth of cancer cells. This medicine may be used for other purposes; ask your health care provider or pharmacist if you have questions. COMMON BRAND NAME(S): Camptosar What should I tell my care team before I take this medication? They need to know if you have any of these conditions: Dehydration Diarrhea Infection, especially a viral infection, such as chickenpox, cold sores, herpes Liver disease Low blood cell levels (white cells, red cells, and platelets) Low levels of electrolytes, such as calcium, magnesium, or potassium in your blood  Recent or ongoing radiation An unusual or allergic reaction to irinotecan, other medications, foods, dyes, or preservatives If you or your partner are pregnant or trying to get pregnant Breast-feeding How should I use this medication? This medication is injected  into a vein. It is given by your care team in a hospital or clinic setting. Talk to your care team about the use of this medication in children. Special care may be needed. Overdosage: If you think you have taken too much of this medicine contact a poison control center or emergency room at once. NOTE: This medicine is only for you. Do not share this medicine with others. What if I miss a dose? Keep appointments for follow-up doses. It is important not to miss your dose. Call your care team if you are unable to keep an appointment. What may interact with this medication? Do not take this medication with any of the following: Cobicistat Itraconazole This medication may also interact with the following: Certain antibiotics, such as clarithromycin, rifampin, rifabutin Certain antivirals for HIV or AIDS Certain medications for fungal infections, such as ketoconazole, posaconazole, voriconazole Certain medications for seizures, such as carbamazepine, phenobarbital, phenytoin Gemfibrozil Nefazodone St. John's wort This list may not describe all possible interactions. Give your health care provider a list of all the medicines, herbs, non-prescription drugs, or dietary supplements you use. Also tell them if you smoke, drink alcohol, or use illegal drugs. Some items may interact with your medicine. What should I watch for while using this medication? Your condition will be monitored carefully while you are receiving this medication. You may need blood work while taking this medication. This medication may make you feel generally unwell. This is not uncommon as chemotherapy can affect healthy cells as well as cancer cells. Report any side effects. Continue your course of treatment even though you feel ill unless your care team tells you to stop. This medication can cause serious side effects. To reduce the risk, your care team may give you other medications to take before receiving this one. Be sure to  follow the directions from your care team. This medication may affect your coordination, reaction time, or judgement. Do not drive or operate machinery until you know how this medication affects you. Sit up or stand slowly to reduce the risk of dizzy or fainting spells. Drinking alcohol with this medication can increase the risk of these side effects. This medication may increase your risk of getting an infection. Call your care team for advice if you get a fever, chills, sore throat, or other symptoms of a cold or flu. Do not treat yourself. Try to avoid being around people who are sick. Avoid taking medications that contain aspirin, acetaminophen, ibuprofen, naproxen, or ketoprofen unless instructed by your care team. These medications may hide a fever. This medication may increase your risk to bruise or bleed. Call your care team if you notice any unusual bleeding. Be careful brushing or flossing your teeth or using a toothpick because you may get an infection or bleed more easily. If you have any dental work done, tell your dentist you are receiving this medication. Talk to your care team if you or your partner are pregnant or think either of you might be pregnant. This medication can cause serious birth defects if taken during pregnancy and for 6 months after the last dose. You will need a negative pregnancy test before starting this medication. Contraception is recommended while taking this medication and for 6 months after the  last dose. Your care team can help you find the option that works for you. Do not father a child while taking this medication and for 3 months after the last dose. Use a condom for contraception during this time period. Do not breastfeed while taking this medication and for 7 days after the last dose. This medication may cause infertility. Talk to your care team if you are concerned about your fertility. What side effects may I notice from receiving this medication? Side  effects that you should report to your care team as soon as possible: Allergic reactions--skin rash, itching, hives, swelling of the face, lips, tongue, or throat Dry cough, shortness of breath or trouble breathing Increased saliva or tears, increased sweating, stomach cramping, diarrhea, small pupils, unusual weakness or fatigue, slow heartbeat Infection--fever, chills, cough, sore throat, wounds that don't heal, pain or trouble when passing urine, general feeling of discomfort or being unwell Kidney injury--decrease in the amount of urine, swelling of the ankles, hands, or feet Low red blood cell level--unusual weakness or fatigue, dizziness, headache, trouble breathing Severe or prolonged diarrhea Unusual bruising or bleeding Side effects that usually do not require medical attention (report to your care team if they continue or are bothersome): Constipation Diarrhea Hair loss Loss of appetite Nausea Stomach pain This list may not describe all possible side effects. Call your doctor for medical advice about side effects. You may report side effects to FDA at 1-800-FDA-1088. Where should I keep my medication? This medication is given in a hospital or clinic. It will not be stored at home. NOTE: This sheet is a summary. It may not cover all possible information. If you have questions about this medicine, talk to your doctor, pharmacist, or health care provider.  2024 Elsevier/Gold Standard (2022-03-31 00:00:00)  Leucovorin Injection What is this medication? LEUCOVORIN (loo koe VOR in) prevents side effects from certain medications, such as methotrexate. It works by increasing folate levels. This helps protect healthy cells in your body. It may also be used to treat anemia caused by low levels of folate. It can also be used with fluorouracil, a type of chemotherapy, to treat colorectal cancer. It works by increasing the effects of fluorouracil in the body. This medicine may be used for  other purposes; ask your health care provider or pharmacist if you have questions. What should I tell my care team before I take this medication? They need to know if you have any of these conditions: Anemia from low levels of vitamin B12 in the blood An unusual or allergic reaction to leucovorin, folic acid, other medications, foods, dyes, or preservatives Pregnant or trying to get pregnant Breastfeeding How should I use this medication? This medication is injected into a vein or a muscle. It is given by your care team in a hospital or clinic setting. Talk to your care team about the use of this medication in children. Special care may be needed. Overdosage: If you think you have taken too much of this medicine contact a poison control center or emergency room at once. NOTE: This medicine is only for you. Do not share this medicine with others. What if I miss a dose? Keep appointments for follow-up doses. It is important not to miss your dose. Call your care team if you are unable to keep an appointment. What may interact with this medication? Capecitabine Fluorouracil Phenobarbital Phenytoin Primidone Trimethoprim;sulfamethoxazole This list may not describe all possible interactions. Give your health care provider a list of all the  medicines, herbs, non-prescription drugs, or dietary supplements you use. Also tell them if you smoke, drink alcohol, or use illegal drugs. Some items may interact with your medicine. What should I watch for while using this medication? Your condition will be monitored carefully while you are receiving this medication. This medication may increase the side effects of 5-fluorouracil. Tell your care team if you have diarrhea or mouth sores that do not get better or that get worse. What side effects may I notice from receiving this medication? Side effects that you should report to your care team as soon as possible: Allergic reactions--skin rash, itching, hives,  swelling of the face, lips, tongue, or throat This list may not describe all possible side effects. Call your doctor for medical advice about side effects. You may report side effects to FDA at 1-800-FDA-1088. Where should I keep my medication? This medication is given in a hospital or clinic. It will not be stored at home. NOTE: This sheet is a summary. It may not cover all possible information. If you have questions about this medicine, talk to your doctor, pharmacist, or health care provider.  2024 Elsevier/Gold Standard (2022-04-22 00:00:00)   Fluorouracil Injection What is this medication? FLUOROURACIL (flure oh YOOR a sil) treats some types of cancer. It works by slowing down the growth of cancer cells. This medicine may be used for other purposes; ask your health care provider or pharmacist if you have questions. COMMON BRAND NAME(S): Adrucil What should I tell my care team before I take this medication? They need to know if you have any of these conditions: Blood disorders Dihydropyrimidine dehydrogenase (DPD) deficiency Infection, such as chickenpox, cold sores, herpes Kidney disease Liver disease Poor nutrition Recent or ongoing radiation therapy An unusual or allergic reaction to fluorouracil, other medications, foods, dyes, or preservatives If you or your partner are pregnant or trying to get pregnant Breast-feeding How should I use this medication? This medication is injected into a vein. It is administered by your care team in a hospital or clinic setting. Talk to your care team about the use of this medication in children. Special care may be needed. Overdosage: If you think you have taken too much of this medicine contact a poison control center or emergency room at once. NOTE: This medicine is only for you. Do not share this medicine with others. What if I miss a dose? Keep appointments for follow-up doses. It is important not to miss your dose. Call your care team if  you are unable to keep an appointment. What may interact with this medication? Do not take this medication with any of the following: Live virus vaccines This medication may also interact with the following: Medications that treat or prevent blood clots, such as warfarin, enoxaparin, dalteparin This list may not describe all possible interactions. Give your health care provider a list of all the medicines, herbs, non-prescription drugs, or dietary supplements you use. Also tell them if you smoke, drink alcohol, or use illegal drugs. Some items may interact with your medicine. What should I watch for while using this medication? Your condition will be monitored carefully while you are receiving this medication. This medication may make you feel generally unwell. This is not uncommon as chemotherapy can affect healthy cells as well as cancer cells. Report any side effects. Continue your course of treatment even though you feel ill unless your care team tells you to stop. In some cases, you may be given additional medications to help with  side effects. Follow all directions for their use. This medication may increase your risk of getting an infection. Call your care team for advice if you get a fever, chills, sore throat, or other symptoms of a cold or flu. Do not treat yourself. Try to avoid being around people who are sick. This medication may increase your risk to bruise or bleed. Call your care team if you notice any unusual bleeding. Be careful brushing or flossing your teeth or using a toothpick because you may get an infection or bleed more easily. If you have any dental work done, tell your dentist you are receiving this medication. Avoid taking medications that contain aspirin, acetaminophen, ibuprofen, naproxen, or ketoprofen unless instructed by your care team. These medications may hide a fever. Do not treat diarrhea with over the counter products. Contact your care team if you have diarrhea  that lasts more than 2 days or if it is severe and watery. This medication can make you more sensitive to the sun. Keep out of the sun. If you cannot avoid being in the sun, wear protective clothing and sunscreen. Do not use sun lamps, tanning beds, or tanning booths. Talk to your care team if you or your partner wish to become pregnant or think you might be pregnant. This medication can cause serious birth defects if taken during pregnancy and for 3 months after the last dose. A reliable form of contraception is recommended while taking this medication and for 3 months after the last dose. Talk to your care team about effective forms of contraception. Do not father a child while taking this medication and for 3 months after the last dose. Use a condom while having sex during this time period. Do not breastfeed while taking this medication. This medication may cause infertility. Talk to your care team if you are concerned about your fertility. What side effects may I notice from receiving this medication? Side effects that you should report to your care team as soon as possible: Allergic reactions--skin rash, itching, hives, swelling of the face, lips, tongue, or throat Heart attack--pain or tightness in the chest, shoulders, arms, or jaw, nausea, shortness of breath, cold or clammy skin, feeling faint or lightheaded Heart failure--shortness of breath, swelling of the ankles, feet, or hands, sudden weight gain, unusual weakness or fatigue Heart rhythm changes--fast or irregular heartbeat, dizziness, feeling faint or lightheaded, chest pain, trouble breathing High ammonia level--unusual weakness or fatigue, confusion, loss of appetite, nausea, vomiting, seizures Infection--fever, chills, cough, sore throat, wounds that don't heal, pain or trouble when passing urine, general feeling of discomfort or being unwell Low red blood cell level--unusual weakness or fatigue, dizziness, headache, trouble  breathing Pain, tingling, or numbness in the hands or feet, muscle weakness, change in vision, confusion or trouble speaking, loss of balance or coordination, trouble walking, seizures Redness, swelling, and blistering of the skin over hands and feet Severe or prolonged diarrhea Unusual bruising or bleeding Side effects that usually do not require medical attention (report to your care team if they continue or are bothersome): Dry skin Headache Increased tears Nausea Pain, redness, or swelling with sores inside the mouth or throat Sensitivity to light Vomiting This list may not describe all possible side effects. Call your doctor for medical advice about side effects. You may report side effects to FDA at 1-800-FDA-1088. Where should I keep my medication? This medication is given in a hospital or clinic. It will not be stored at home. NOTE: This sheet is  a summary. It may not cover all possible information. If you have questions about this medicine, talk to your doctor, pharmacist, or health care provider.  2024 Elsevier/Gold Standard (2022-03-25 00:00:00)

## 2023-06-24 ENCOUNTER — Other Ambulatory Visit: Payer: Self-pay | Admitting: Oncology

## 2023-06-24 LAB — CEA: CEA: 123 ng/mL — ABNORMAL HIGH (ref 0.0–4.7)

## 2023-06-25 ENCOUNTER — Inpatient Hospital Stay: Payer: Medicare Other

## 2023-06-25 VITALS — BP 136/69 | HR 91 | Temp 97.7°F | Resp 16

## 2023-06-25 DIAGNOSIS — Z5111 Encounter for antineoplastic chemotherapy: Secondary | ICD-10-CM | POA: Diagnosis not present

## 2023-06-25 DIAGNOSIS — C184 Malignant neoplasm of transverse colon: Secondary | ICD-10-CM

## 2023-06-25 MED ORDER — SODIUM CHLORIDE 0.9% FLUSH
10.0000 mL | INTRAVENOUS | Status: DC | PRN
  Administered 2023-06-25: 10 mL
  Filled 2023-06-25: qty 10

## 2023-06-25 MED ORDER — HEPARIN SOD (PORK) LOCK FLUSH 100 UNIT/ML IV SOLN
500.0000 [IU] | Freq: Once | INTRAVENOUS | Status: AC | PRN
  Administered 2023-06-25: 500 [IU]
  Filled 2023-06-25: qty 5

## 2023-06-26 ENCOUNTER — Ambulatory Visit
Admission: RE | Admit: 2023-06-26 | Discharge: 2023-06-26 | Disposition: A | Discharge status: Home / Self Care | Payer: Medicare Other | Attending: Gastroenterology | Admitting: Gastroenterology

## 2023-06-26 ENCOUNTER — Ambulatory Visit: Payer: Medicare Other | Admitting: Anesthesiology

## 2023-06-26 ENCOUNTER — Encounter
Admission: RE | Disposition: A | Discharge status: Home / Self Care | Payer: Self-pay | Source: Home / Self Care | Attending: Gastroenterology

## 2023-06-26 ENCOUNTER — Encounter: Payer: Self-pay | Admitting: *Deleted

## 2023-06-26 DIAGNOSIS — Z980 Intestinal bypass and anastomosis status: Secondary | ICD-10-CM | POA: Diagnosis not present

## 2023-06-26 DIAGNOSIS — K559 Vascular disorder of intestine, unspecified: Secondary | ICD-10-CM | POA: Diagnosis not present

## 2023-06-26 DIAGNOSIS — C19 Malignant neoplasm of rectosigmoid junction: Secondary | ICD-10-CM | POA: Insufficient documentation

## 2023-06-26 DIAGNOSIS — K5730 Diverticulosis of large intestine without perforation or abscess without bleeding: Secondary | ICD-10-CM | POA: Insufficient documentation

## 2023-06-26 DIAGNOSIS — C7889 Secondary malignant neoplasm of other digestive organs: Secondary | ICD-10-CM | POA: Insufficient documentation

## 2023-06-26 DIAGNOSIS — R933 Abnormal findings on diagnostic imaging of other parts of digestive tract: Secondary | ICD-10-CM | POA: Diagnosis present

## 2023-06-26 DIAGNOSIS — K640 First degree hemorrhoids: Secondary | ICD-10-CM | POA: Diagnosis not present

## 2023-06-26 DIAGNOSIS — D124 Benign neoplasm of descending colon: Secondary | ICD-10-CM | POA: Insufficient documentation

## 2023-06-26 HISTORY — PX: COLONOSCOPY: SHX5424

## 2023-06-26 HISTORY — PX: POLYPECTOMY: SHX5525

## 2023-06-26 HISTORY — PX: BIOPSY: SHX5522

## 2023-06-26 HISTORY — PX: ESOPHAGOGASTRODUODENOSCOPY: SHX5428

## 2023-06-26 LAB — GLUCOSE, CAPILLARY: Glucose-Capillary: 139 mg/dL — ABNORMAL HIGH (ref 70–99)

## 2023-06-26 SURGERY — COLONOSCOPY
Anesthesia: General

## 2023-06-26 MED ORDER — SODIUM CHLORIDE 0.9 % IV SOLN: INTRAVENOUS | Status: DC

## 2023-06-26 MED ORDER — HEPARIN SOD (PORK) LOCK FLUSH 100 UNIT/ML IV SOLN
INTRAVENOUS | Status: AC
  Filled 2023-06-26: qty 5

## 2023-06-26 MED ORDER — PHENYLEPHRINE HCL (PRESSORS) 10 MG/ML IV SOLN
INTRAVENOUS | Status: DC | PRN
  Administered 2023-06-26 (×4): 80 ug via INTRAVENOUS

## 2023-06-26 MED ORDER — SODIUM CHLORIDE 0.9% FLUSH: 10.0000 mL | INTRAVENOUS | Status: DC | PRN

## 2023-06-26 MED ORDER — LIDOCAINE HCL (PF) 2 % IJ SOLN
INTRAMUSCULAR | Status: AC
  Filled 2023-06-26: qty 5

## 2023-06-26 MED ORDER — PHENYLEPHRINE 80 MCG/ML (10ML) SYRINGE FOR IV PUSH (FOR BLOOD PRESSURE SUPPORT)
PREFILLED_SYRINGE | INTRAVENOUS | Status: AC
  Filled 2023-06-26: qty 10

## 2023-06-26 MED ORDER — HEPARIN SOD (PORK) LOCK FLUSH 100 UNIT/ML IV SOLN: 250.0000 [IU] | INTRAVENOUS | Status: DC | PRN

## 2023-06-26 MED ORDER — PROPOFOL 500 MG/50ML IV EMUL
INTRAVENOUS | Status: DC | PRN
  Administered 2023-06-26: 100 ug/kg/min via INTRAVENOUS

## 2023-06-26 MED ORDER — PROPOFOL 10 MG/ML IV BOLUS
INTRAVENOUS | Status: DC | PRN
  Administered 2023-06-26 (×2): 30 mg via INTRAVENOUS

## 2023-06-26 MED ORDER — PROPOFOL 10 MG/ML IV BOLUS
INTRAVENOUS | Status: AC
  Filled 2023-06-26: qty 40

## 2023-06-26 MED ORDER — LIDOCAINE HCL (CARDIAC) PF 100 MG/5ML IV SOSY
PREFILLED_SYRINGE | INTRAVENOUS | Status: DC | PRN
  Administered 2023-06-26: 80 mg via INTRAVENOUS

## 2023-06-26 NOTE — H&P (Signed)
Outpatient short stay form Pre-procedure 06/26/2023  Regis Bill, MD  Primary Physician: Jaclyn Shaggy, MD  Reason for visit:  Abnormal imaging  History of present illness:    72 y/o lady with history of hypertension, COPD, and colon cancer s/p right hemi with apparent involvement of the stomach that was removed. Recent imaging with concerning findings for recurrence at the anastomosis. No blood thinners.    Current Facility-Administered Medications:    0.9 %  sodium chloride infusion, , Intravenous, Continuous, Tyreka Henneke, Rossie Muskrat, MD, Last Rate: 20 mL/hr at 06/26/23 1313, New Bag at 06/26/23 1313   heparin lock flush 100 unit/mL, 250 Units, Intracatheter, PRN, Darleene Cleaver, Gijsbertus F, MD   sodium chloride flush (NS) 0.9 % injection 10 mL, 10 mL, Intracatheter, PRN, Darleene Cleaver, Gerrit Heck, MD  Medications Prior to Admission  Medication Sig Dispense Refill Last Dose   acetaminophen (TYLENOL) 325 MG tablet Take 650 mg by mouth every 6 (six) hours as needed.   Past Week   albuterol (VENTOLIN HFA) 108 (90 Base) MCG/ACT inhaler Inhale into the lungs.   06/25/2023   atorvastatin (LIPITOR) 40 MG tablet Take 40 mg by mouth daily.   06/26/2023   clonazePAM (KLONOPIN) 1 MG tablet Take 1 mg by mouth 2 (two) times daily.   06/26/2023   fenofibrate 54 MG tablet Take 54 mg by mouth daily.   06/26/2023   gabapentin (NEURONTIN) 600 MG tablet Take by mouth 2 (two) times daily.   06/26/2023   insulin lispro (HUMALOG) 100 UNIT/ML injection Inject 1-10 Units into the skin 3 (three) times daily before meals. Per sliding scale   06/25/2023   LANTUS SOLOSTAR 100 UNIT/ML Solostar Pen Inject 12 Units into the skin at bedtime.   Past Week   lidocaine-prilocaine (EMLA) cream Apply to affected area once 30 g 3 Past Week   lisinopril (ZESTRIL) 20 MG tablet Take 20 mg by mouth daily.   06/26/2023   loperamide (IMODIUM) 2 MG capsule Take 2 tabs by mouth with first loose stool, then 1 tab with each additional  loose stool as needed. Do not exceed 8 tabs in a 24-hour period 60 capsule 2 Past Week   Magnesium Citrate 200 MG TABS Take 1 tablet by mouth in the morning and at bedtime. 250 twice daily   06/26/2023   metoprolol succinate (TOPROL-XL) 50 MG 24 hr tablet Take 50 mg by mouth daily. Take with or immediately following a meal.   06/26/2023   pantoprazole (PROTONIX) 40 MG tablet Take 1 tablet (40 mg total) by mouth daily. 30 tablet 5 06/26/2023   ranolazine (RANEXA) 500 MG 12 hr tablet Take 500 mg by mouth 2 (two) times daily.   06/26/2023   sitaGLIPtin-metformin (JANUMET) 50-1000 MG tablet Take 1 tablet by mouth daily.   Past Week   venlafaxine XR (EFFEXOR-XR) 75 MG 24 hr capsule Take 75 mg by mouth daily.   06/26/2023     Allergies  Allergen Reactions   Codeine Rash    Happened in 1970s     Past Medical History:  Diagnosis Date   Anemia    Anxiety    Asthma    Cancer (HCC)    colon   CHF (congestive heart failure) (HCC)    COPD (chronic obstructive pulmonary disease) (HCC)    Diabetes mellitus without complication (HCC)    Hyperlipidemia    Hypertension    Stroke [facility])     Review of systems:  Otherwise negative.  Physical Exam  Gen: Alert, oriented. Appears stated age.  HEENT: PERRLA. Lungs: No respiratory distress CV: RRR Abd: soft, benign, no masses Ext: No edema    Planned procedures: Proceed with EGD/colonoscopy. The patient understands the nature of the planned procedure, indications, risks, alternatives and potential complications including but not limited to bleeding, infection, perforation, damage to internal organs and possible oversedation/side effects from anesthesia. The patient agrees and gives consent to proceed.  Please refer to procedure notes for findings, recommendations and patient disposition/instructions.     Regis Bill, MD [facility]

## 2023-06-26 NOTE — Anesthesia Preprocedure Evaluation (Addendum)
Anesthesia Evaluation  Patient identified by MRN, date of birth, ID band Patient awake    Reviewed: Allergy & Precautions, NPO status , Patient's Chart, lab work & pertinent test results  Airway Mallampati: II  TM Distance: >3 FB Neck ROM: full    Dental  (+) Upper Dentures, Lower Dentures   Pulmonary COPD,  COPD inhaler, former smoker   Pulmonary exam normal  + decreased breath sounds      Cardiovascular Exercise Tolerance: Poor hypertension, Pt. on medications +CHF  Normal cardiovascular exam Rhythm:Regular Rate:Normal     Neuro/Psych    Depression    CVA negative neurological ROS  negative psych ROS   GI/Hepatic negative GI ROS, Neg liver ROS, PUD,,,  Endo/Other  diabetes, Type 1, Insulin Dependent    Renal/GU negative Renal ROS  negative genitourinary   Musculoskeletal  (+) Arthritis ,    Abdominal Normal abdominal exam  (+)   Peds negative pediatric ROS (+)  Hematology negative hematology ROS (+)   Anesthesia Other Findings Past Medical History: No date: Anemia No date: Anxiety No date: Asthma No date: Cancer [facility])     Comment:  colon No date: CHF (congestive heart failure) (HCC) No date: COPD (chronic obstructive pulmonary disease) (HCC) No date: Diabetes mellitus without complication (HCC) No date: Hyperlipidemia No date: Hypertension No date: Stroke [facility])  Past Surgical History: 02/03/2022: COLONOSCOPY WITH PROPOFOL; N/A     Comment:  Procedure: COLONOSCOPY WITH PROPOFOL;  Surgeon: Toney Reil, MD;  Location: [facility] ENDOSCOPY;  Service:               Gastroenterology;  Laterality: N/A; 02/03/2022: ESOPHAGOGASTRODUODENOSCOPY (EGD) WITH PROPOFOL; N/A     Comment:  Procedure: ESOPHAGOGASTRODUODENOSCOPY (EGD) WITH               PROPOFOL;  Surgeon: Toney Reil, MD;  Location:               [facility] ENDOSCOPY;  Service: Gastroenterology;  Laterality:               N/A; 02/03/2022:  GIVENS CAPSULE STUDY; N/A     Comment:  Procedure: GIVENS CAPSULE STUDY;  Surgeon: Toney Reil, MD;  Location: [facility] ENDOSCOPY;  Service:               Gastroenterology;  Laterality: N/A;  possible Capsule               Study 06/18/2022: IR IMAGING GUIDED PORT INSERTION  BMI    Body Mass Index: 22.49 kg/m      Reproductive/Obstetrics negative OB ROS                              Anesthesia Physical Anesthesia Plan  ASA: 3  Anesthesia Plan: General   Post-op Pain Management:    Induction: Intravenous  PONV Risk Score and Plan: Propofol infusion and TIVA  Airway Management Planned: Natural Airway  Additional Equipment:   Intra-op Plan:   Post-operative Plan:   Informed Consent: I have reviewed the patients History and Physical, chart, labs and discussed the procedure including the risks, benefits and alternatives for the proposed anesthesia with the patient or authorized representative who has indicated his/her understanding and acceptance.     Dental Advisory Given  Plan Discussed with: CRNA  and Surgeon  Anesthesia Plan Comments:          Anesthesia Quick Evaluation

## 2023-06-26 NOTE — Op Note (Signed)
[facility] Gastroenterology Patient Name: Jessica Stout Procedure Date: 06/26/2023 1:40 PM MRN: [id number] Account #: [id number] Date of Birth: 11/07/1951 Admit Type: Outpatient Age: 72 Room: [facility] ENDO ROOM 3 Gender: Female Note Status: Finalized Instrument Name: Peds Colonoscope [id number] Procedure:             Colonoscopy Indications:           Abnormal PET scan of the GI tract Providers:             Eather Colas MD, MD Referring MD:          Jillene Bucks. Arlana Pouch, MD (Referring MD) Medicines:             Monitored Anesthesia Care Complications:         No immediate complications. Estimated blood loss:                         Minimal. Procedure:             Pre-Anesthesia Assessment:                        - Prior to the procedure, a History and Physical was                         performed, and patient medications and allergies were                         reviewed. The patient is competent. The risks and                         benefits of the procedure and the sedation options and                         risks were discussed with the patient. All questions                         were answered and informed consent was obtained.                         Patient identification and proposed procedure were                         verified by the physician, the nurse, the                         anesthesiologist, the anesthetist and the technician                         in the endoscopy suite. Mental Status Examination:                         alert and oriented. Airway Examination: normal                         oropharyngeal airway and neck mobility. Respiratory                         Examination: clear to auscultation. CV Examination:  normal. Prophylactic Antibiotics: The patient does not                         require prophylactic antibiotics. Prior                         Anticoagulants: The patient has taken no anticoagulant                          or antiplatelet agents. ASA Grade Assessment: III - A                         patient with severe systemic disease. After reviewing                         the risks and benefits, the patient was deemed in                         satisfactory condition to undergo the procedure. The                         anesthesia plan was to use monitored anesthesia care                         (MAC). Immediately prior to administration of                         medications, the patient was re-assessed for adequacy                         to receive sedatives. The heart rate, respiratory                         rate, oxygen saturations, blood pressure, adequacy of                         pulmonary ventilation, and response to care were                         monitored throughout the procedure. The physical                         status of the patient was re-assessed after the                         procedure.                        After obtaining informed consent, the colonoscope was                         passed under direct vision. Throughout the procedure,                         the patient's blood pressure, pulse, and oxygen                         saturations were monitored continuously. The  Colonoscope was introduced through the anus and                         advanced to the the ileocolonic anastomosis. The                         colonoscopy was performed without difficulty. The                         patient tolerated the procedure well. The quality of                         the bowel preparation was adequate to identify polyps.                         The rectum was photographed. Findings:      The perianal and digital rectal examinations were normal.      There was evidence of a prior end-to-side colo-colonic anastomosis in       the transverse colon. This was patent and was characterized by healthy       appearing mucosa. There was one spot that  showed ulceration but overall       the anastomosis appeared healthy. Biopsies were taken with a cold       forceps for histology. Estimated blood loss was minimal.      A tattoo was seen in the transverse colon. The tattoo site appeared       normal.      Multiple small-mouthed diverticula were found in the sigmoid colon,       descending colon and transverse colon.      A 3 mm polyp was found in the descending colon. The polyp was sessile.       The polyp was removed with a cold snare. Resection and retrieval were       complete. Estimated blood loss was minimal.      Internal hemorrhoids were found during retroflexion. The hemorrhoids       were Grade I (internal hemorrhoids that do not prolapse).      The exam was otherwise without abnormality on direct and retroflexion       views. Impression:            - Patent end-to-side colo-colonic anastomosis,                         characterized by healthy appearing mucosa. Biopsied.                        - A tattoo was seen in the transverse colon. The                         tattoo site appeared normal.                        - Diverticulosis in the sigmoid colon, in the                         descending colon and in the transverse colon.                        - One 3 mm polyp  in the descending colon, removed with                         a cold snare. Resected and retrieved.                        - Internal hemorrhoids.                        - The examination was otherwise normal on direct and                         retroflexion views. Recommendation:        - Discharge patient to home.                        - Resume previous diet.                        - Continue present medications.                        - Await pathology results.                        - Repeat colonoscopy for surveillance based on                         pathology results.                        - Return to referring physician as previously                          scheduled. Procedure Code(s):     --- Professional ---                        [phone], Colonoscopy, flexible; with removal of                         tumor(s), polyp(s), or other lesion(s) by snare                         technique                        45380, 59, Colonoscopy, flexible; with biopsy, single                         or multiple Diagnosis Code(s):     --- Professional ---                        Z98.0, Intestinal bypass and anastomosis status                        K64.0, First degree hemorrhoids                        D12.4, Benign neoplasm of descending colon                        K57.30, Diverticulosis of large intestine without  perforation or abscess without bleeding                        R93.3, Abnormal findings on diagnostic imaging of                         other parts of digestive tract CPT copyright 2022 American [employer] Association. All rights reserved. The codes documented in this report are preliminary and upon coder review may  be revised to meet current compliance requirements. Eather Colas MD, MD 06/26/2023 2:49:39 PM Number of Addenda: 0 Note Initiated On: 06/26/2023 1:40 PM Scope Withdrawal Time: 0 hours 11 minutes 47 seconds  Total Procedure Duration: 0 hours 18 minutes 50 seconds  Estimated Blood Loss:  Estimated blood loss was minimal.      [facility]

## 2023-06-26 NOTE — Op Note (Signed)
[facility] Gastroenterology Patient Name: Jessica Stout Procedure Date: 06/26/2023 1:42 PM MRN: [id number] Account #: [id number] Date of Birth: 1950/12/18 Admit Type: Outpatient Age: 72 Room: [facility] ENDO ROOM 3 Gender: Female Note Status: Finalized Instrument Name: Patton Salles Endoscope [id number] Procedure:             Upper GI endoscopy Indications:           Abnormal PET scan of the GI tract Providers:             Eather Colas MD, MD Referring MD:          Jillene Bucks. Arlana Pouch, MD (Referring MD) Medicines:             Monitored Anesthesia Care Complications:         No immediate complications. Estimated blood loss:                         Minimal. Procedure:             Pre-Anesthesia Assessment:                        - Prior to the procedure, a History and Physical was                         performed, and patient medications and allergies were                         reviewed. The patient is competent. The risks and                         benefits of the procedure and the sedation options and                         risks were discussed with the patient. All questions                         were answered and informed consent was obtained.                         Patient identification and proposed procedure were                         verified by the physician, the nurse, the                         anesthesiologist, the anesthetist and the technician                         in the endoscopy suite. Mental Status Examination:                         alert and oriented. Airway Examination: normal                         oropharyngeal airway and neck mobility. Respiratory                         Examination: clear to auscultation. CV Examination:  normal. Prophylactic Antibiotics: The patient does not                         require prophylactic antibiotics. Prior                         Anticoagulants: The patient has taken no anticoagulant                          or antiplatelet agents. ASA Grade Assessment: III - A                         patient with severe systemic disease. After reviewing                         the risks and benefits, the patient was deemed in                         satisfactory condition to undergo the procedure. The                         anesthesia plan was to use monitored anesthesia care                         (MAC). Immediately prior to administration of                         medications, the patient was re-assessed for adequacy                         to receive sedatives. The heart rate, respiratory                         rate, oxygen saturations, blood pressure, adequacy of                         pulmonary ventilation, and response to care were                         monitored throughout the procedure. The physical                         status of the patient was re-assessed after the                         procedure.                        After obtaining informed consent, the endoscope was                         passed under direct vision. Throughout the procedure,                         the patient's blood pressure, pulse, and oxygen                         saturations were monitored continuously. The Endoscope  was introduced through the mouth, and advanced to the                         second part of duodenum. The upper GI endoscopy was                         accomplished without difficulty. The patient tolerated                         the procedure well. Findings:      The examined esophagus was normal.      A medium-sized, sessile, non-circumferential mass with no bleeding and       no stigmata of recent bleeding was found on the greater curvature of the       gastric antrum. Biopsies were taken with a cold forceps for histology.       Estimated blood loss was minimal.      The examined duodenum was normal. Impression:            - Normal esophagus.                         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 Biopsied.                        - Normal examined duodenum. Recommendation:        - Discharge patient to home.                        - Resume previous diet.                        - Continue present medications.                        - Await pathology results.                        - Return to referring physician as previously                         scheduled. Procedure Code(s):     --- Professional ---                        [phone], Esophagogastroduodenoscopy, flexible,                         transoral; with biopsy, single or multiple Diagnosis Code(s):     --- Professional ---                        D49.0, Neoplasm of unspecified behavior of digestive                         system                        R93.3, Abnormal findings on diagnostic imaging of  other parts of digestive tract CPT copyright 2022 American Medical Association. All rights reserved. The codes documented in this report are preliminary and upon coder review may  be revised to meet current compliance requirements. Eather Colas MD, MD 06/26/2023 2:37:03 PM Number of Addenda: 0 Note Initiated On: 06/26/2023 1:42 PM Estimated Blood Loss:  Estimated blood loss was minimal.      [facility]

## 2023-06-26 NOTE — Transfer of Care (Signed)
Immediate Anesthesia Transfer of Care Note  Patient: Jessica Stout  Procedure(s) Performed: COLONOSCOPY ESOPHAGOGASTRODUODENOSCOPY (EGD) BIOPSY POLYPECTOMY  Patient Location: PACU  Anesthesia Type:MAC  Level of Consciousness: awake  Airway & Oxygen Therapy: Patient Spontanous Breathing  Post-op Assessment: Report given to RN and Post -op Vital signs reviewed and stable  Post vital signs: Reviewed and stable  Last Vitals:  Vitals Value Taken Time  BP 91/69 06/26/23 1434  Temp 36 C 06/26/23 1434  Pulse 98 06/26/23 1435  Resp 17 06/26/23 1435  SpO2 98 % 06/26/23 1435  Vitals shown include unfiled device data.  Last Pain:  Vitals:   06/26/23 1434  TempSrc: Temporal  PainSc: 0-No pain         Complications: No notable events documented.

## 2023-06-26 NOTE — Interval H&P Note (Signed)
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  The various methods of treatment have been discussed with the patient and family. After consideration of risks, benefits and other options for treatment, the patient has consented to  Procedure(s): COLONOSCOPY (N/A) ESOPHAGOGASTRODUODENOSCOPY (EGD) (N/A) as a surgical intervention.  The patient's history has been reviewed, patient examined, no change in status, stable for surgery.  I have reviewed the patient's chart and labs.  Questions were answered to the patient's satisfaction.     Regis Bill  Ok to proceed with EGD/Colonoscopy

## 2023-06-29 ENCOUNTER — Encounter: Payer: Self-pay | Admitting: Gastroenterology

## 2023-06-29 NOTE — Anesthesia Postprocedure Evaluation (Signed)
Anesthesia Post Note  Patient: Jessica Stout  Procedure(s) Performed: COLONOSCOPY ESOPHAGOGASTRODUODENOSCOPY (EGD) BIOPSY POLYPECTOMY  Patient location during evaluation: PACU Anesthesia Type: General Level of consciousness: awake and awake and alert Pain management: pain level controlled Vital Signs Assessment: post-procedure vital signs reviewed and stable Respiratory status: nonlabored ventilation and respiratory function stable Cardiovascular status: blood pressure returned to baseline Anesthetic complications: no   No notable events documented.   Last Vitals:  Vitals:   06/26/23 1434 06/26/23 1444  BP: 91/69 (!) 149/79  Pulse: 91 82  Resp: 20 14  Temp: (!) 36 C   SpO2: 98% 98%    Last Pain:  Vitals:   06/27/23 1210  TempSrc:   PainSc: 0-No pain                 VAN STAVEREN,Dorann Davidson

## 2023-07-02 ENCOUNTER — Encounter: Payer: Self-pay | Admitting: Oncology

## 2023-07-06 MED ORDER — FLUOROURACIL CHEMO INJECTION 2.5 GM/50ML
400.0000 mg/m2 | Freq: Once | INTRAVENOUS | Status: AC
  Administered 2023-07-07: 650 mg via INTRAVENOUS
  Filled 2023-07-06: qty 13

## 2023-07-06 MED ORDER — SODIUM CHLORIDE 0.9 % IV SOLN
400.0000 mg/m2 | Freq: Once | INTRAVENOUS | Status: AC
  Administered 2023-07-07: 664 mg via INTRAVENOUS
  Filled 2023-07-06: qty 33.2

## 2023-07-06 MED FILL — Dexamethasone Sodium Phosphate Inj 100 MG/10ML: INTRAMUSCULAR | Qty: 1 | Status: AC

## 2023-07-07 ENCOUNTER — Inpatient Hospital Stay: Payer: Medicare Other

## 2023-07-07 ENCOUNTER — Inpatient Hospital Stay (HOSPITAL_BASED_OUTPATIENT_CLINIC_OR_DEPARTMENT_OTHER): Payer: Medicare Other | Admitting: Nurse Practitioner

## 2023-07-07 ENCOUNTER — Inpatient Hospital Stay: Payer: Medicare Other | Attending: Oncology

## 2023-07-07 ENCOUNTER — Encounter: Payer: Self-pay | Admitting: Nurse Practitioner

## 2023-07-07 VITALS — BP 145/77 | HR 96 | Temp 98.5°F | Wt 132.0 lb

## 2023-07-07 DIAGNOSIS — C184 Malignant neoplasm of transverse colon: Secondary | ICD-10-CM | POA: Diagnosis not present

## 2023-07-07 DIAGNOSIS — Z85118 Personal history of other malignant neoplasm of bronchus and lung: Secondary | ICD-10-CM | POA: Diagnosis not present

## 2023-07-07 DIAGNOSIS — D649 Anemia, unspecified: Secondary | ICD-10-CM | POA: Diagnosis not present

## 2023-07-07 DIAGNOSIS — Z5111 Encounter for antineoplastic chemotherapy: Secondary | ICD-10-CM | POA: Diagnosis not present

## 2023-07-07 DIAGNOSIS — Z800 Family history of malignant neoplasm of digestive organs: Secondary | ICD-10-CM | POA: Diagnosis not present

## 2023-07-07 DIAGNOSIS — C19 Malignant neoplasm of rectosigmoid junction: Secondary | ICD-10-CM | POA: Diagnosis not present

## 2023-07-07 DIAGNOSIS — Z87891 Personal history of nicotine dependence: Secondary | ICD-10-CM | POA: Diagnosis not present

## 2023-07-07 DIAGNOSIS — D72819 Decreased white blood cell count, unspecified: Secondary | ICD-10-CM | POA: Diagnosis not present

## 2023-07-07 DIAGNOSIS — Z7189 Other specified counseling: Secondary | ICD-10-CM | POA: Diagnosis not present

## 2023-07-07 LAB — CBC WITH DIFFERENTIAL (CANCER CENTER ONLY)
Abs Immature Granulocytes: 0.01 10*3/uL (ref 0.00–0.07)
Basophils Absolute: 0 10*3/uL (ref 0.0–0.1)
Basophils Relative: 1 %
Eosinophils Absolute: 0.1 10*3/uL (ref 0.0–0.5)
Eosinophils Relative: 1 %
HCT: 29.9 % — ABNORMAL LOW (ref 36.0–46.0)
Hemoglobin: 9.5 g/dL — ABNORMAL LOW (ref 12.0–15.0)
Immature Granulocytes: 0 %
Lymphocytes Relative: 15 %
Lymphs Abs: 0.6 10*3/uL — ABNORMAL LOW (ref 0.7–4.0)
MCH: 29.1 pg (ref 26.0–34.0)
MCHC: 31.8 g/dL (ref 30.0–36.0)
MCV: 91.7 fL (ref 80.0–100.0)
Monocytes Absolute: 0.5 10*3/uL (ref 0.1–1.0)
Monocytes Relative: 12 %
Neutro Abs: 2.8 10*3/uL (ref 1.7–7.7)
Neutrophils Relative %: 71 %
Platelet Count: 202 10*3/uL (ref 150–400)
RBC: 3.26 MIL/uL — ABNORMAL LOW (ref 3.87–5.11)
RDW: 15.6 % — ABNORMAL HIGH (ref 11.5–15.5)
WBC Count: 3.9 10*3/uL — ABNORMAL LOW (ref 4.0–10.5)
nRBC: 0 % (ref 0.0–0.2)

## 2023-07-07 LAB — CMP (CANCER CENTER ONLY)
ALT: 13 U/L (ref 0–44)
AST: 18 U/L (ref 15–41)
Albumin: 3.7 g/dL (ref 3.5–5.0)
Alkaline Phosphatase: 43 U/L (ref 38–126)
Anion gap: 8 (ref 5–15)
BUN: 9 mg/dL (ref 8–23)
CO2: 25 mmol/L (ref 22–32)
Calcium: 9.3 mg/dL (ref 8.9–10.3)
Chloride: 105 mmol/L (ref 98–111)
Creatinine: 0.86 mg/dL (ref 0.44–1.00)
GFR, Estimated: >60 mL/min (ref >60)
Glucose, Bld: 133 mg/dL — ABNORMAL HIGH (ref 70–99)
Potassium: 3.8 mmol/L (ref 3.5–5.1)
Sodium: 138 mmol/L (ref 135–145)
Total Bilirubin: 0.3 mg/dL (ref 0.3–1.2)
Total Protein: 6.8 g/dL (ref 6.5–8.1)

## 2023-07-07 LAB — MAGNESIUM: Magnesium: 1.8 mg/dL (ref 1.7–2.4)

## 2023-07-07 MED ORDER — SODIUM CHLORIDE 0.9 % IV SOLN
Freq: Once | INTRAVENOUS | Status: AC
  Filled 2023-07-07: qty 250

## 2023-07-07 MED ORDER — HEPARIN SOD (PORK) LOCK FLUSH 100 UNIT/ML IV SOLN
500.0000 [IU] | Freq: Once | INTRAVENOUS | Status: DC | PRN
  Filled 2023-07-07: qty 5

## 2023-07-07 MED ORDER — SODIUM CHLORIDE 0.9 % IV SOLN
10.0000 mg | Freq: Once | INTRAVENOUS | Status: AC
  Administered 2023-07-07: 10 mg via INTRAVENOUS
  Filled 2023-07-07: qty 10

## 2023-07-07 MED ORDER — ATROPINE SULFATE 1 MG/ML IV SOLN
0.5000 mg | Freq: Once | INTRAVENOUS | Status: AC | PRN
  Administered 2023-07-07: 0.5 mg via INTRAVENOUS
  Filled 2023-07-07: qty 1

## 2023-07-07 MED ORDER — SODIUM CHLORIDE 0.9 % IV SOLN
180.0000 mg/m2 | Freq: Once | INTRAVENOUS | Status: AC
  Administered 2023-07-07: 300 mg via INTRAVENOUS
  Filled 2023-07-07: qty 15

## 2023-07-07 MED ORDER — PALONOSETRON HCL INJECTION 0.25 MG/5ML
0.2500 mg | Freq: Once | INTRAVENOUS | Status: AC
  Administered 2023-07-07: 0.25 mg via INTRAVENOUS
  Filled 2023-07-07: qty 5

## 2023-07-07 MED ORDER — SODIUM CHLORIDE 0.9 % IV SOLN
2400.0000 mg/m2 | INTRAVENOUS | Status: DC
  Administered 2023-07-07: 4000 mg via INTRAVENOUS
  Filled 2023-07-07: qty 80

## 2023-07-07 NOTE — Patient Instructions (Signed)

## 2023-07-07 NOTE — Progress Notes (Unsigned)
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  INTERVAL HISTORY: Patient returns to clinic today for further evaluation and consideration of cycle 3 of FOLFIRI.  She has tolerated treatment well without significant side effects. She underwent endoscopy and colonoscopy. She has chronic peripheral neuropathy that is unchanged. Appetite is stable. She denies any recent fevers or illnesses.  She has no chest pain, shortness of breath, cough, or hemoptysis.  She has no abdominal pain.  She denies any nausea, vomiting, constipation, or diarrhea.  She has no melena or hematochezia.  She has no urinary complaints.  Patient offers no further specific complaints today.  REVIEW OF SYSTEMS:   Review of Systems  Constitutional:  Positive for malaise/fatigue. Negative for fever and weight loss.  Respiratory: Negative.  Negative for cough, hemoptysis and shortness of breath.   Cardiovascular: Negative.  Negative for chest pain and leg swelling.  Gastrointestinal: Negative.  Negative for abdominal pain, blood in stool, constipation, diarrhea, melena, nausea and vomiting.  Genitourinary: Negative.  Negative for dysuria.  Musculoskeletal: Negative.  Negative for back pain and falls.  Skin: Negative.  Negative for rash.  Neurological:  Positive for sensory change and weakness. Negative for dizziness, focal weakness and headaches.  Psychiatric/Behavioral: Negative.  The patient is not nervous/anxious.   As per HPI. Otherwise, a complete review of systems is negative.  PAST MEDICAL  HISTORY: Past Medical History:  Diagnosis Date  . Anemia   . Anxiety   . Asthma   . Cancer (HCC)    colon  . CHF (congestive heart failure) (HCC)   . COPD (chronic obstructive pulmonary disease) (HCC)   . Diabetes mellitus without complication (HCC)   . Hyperlipidemia   . Hypertension   . Stroke [facility])     PAST SURGICAL HISTORY: Past Surgical History:  Procedure Laterality Date  . BIOPSY  06/26/2023   Procedure: BIOPSY;  Surgeon: Regis Bill, MD;  Location: [facility] ENDOSCOPY;  Service: Endoscopy;;  . COLONOSCOPY N/A 06/26/2023   Procedure: COLONOSCOPY;  Surgeon: Regis Bill, MD;  Location: [facility] ENDOSCOPY;  Service: Endoscopy;  Laterality: N/A;  . COLONOSCOPY WITH PROPOFOL N/A 02/03/2022   Procedure: COLONOSCOPY WITH PROPOFOL;  Surgeon: Toney Reil, MD;  Location: [facility] ENDOSCOPY;  Service: Gastroenterology;  Laterality: N/A;  . ESOPHAGOGASTRODUODENOSCOPY N/A 06/26/2023   Procedure: ESOPHAGOGASTRODUODENOSCOPY (EGD);  Surgeon: Regis Bill, MD;  Location: [facility] ENDOSCOPY;  Service: Endoscopy;  Laterality: N/A;  . ESOPHAGOGASTRODUODENOSCOPY (EGD) WITH PROPOFOL N/A 02/03/2022   Procedure: ESOPHAGOGASTRODUODENOSCOPY (EGD) WITH PROPOFOL;  Surgeon: Toney Reil, MD;  Location: [facility] ENDOSCOPY;  Service: Gastroenterology;  Laterality: N/A;  . GIVENS CAPSULE STUDY N/A 02/03/2022   Procedure: GIVENS CAPSULE STUDY;  Surgeon: Toney Reil, MD;  Location: [facility] ENDOSCOPY;  Service: Gastroenterology;  Laterality: N/A;  possible Capsule Study  . IR IMAGING GUIDED PORT INSERTION  06/18/2022  . POLYPECTOMY  06/26/2023   Procedure: POLYPECTOMY;  Surgeon: Regis Bill, MD;  Location: [facility] ENDOSCOPY;  Service: Endoscopy;;    FAMILY HISTORY: Family History  Problem Relation Age of Onset  . Diabetes Mother   . Heart attack Father   .  Colon cancer Father     ADVANCED DIRECTIVES (Y/N):  N   HEALTH MAINTENANCE: Social History   Tobacco Use  . Smoking status:  Former    Types: E-cigarettes    Quit date: 12/08/2022    Years since quitting: 0.5  . Tobacco comments:    Quit Cigarettes 12/2021  Vaping Use  . Vaping status: Every Day  . Substances: Nicotine, THC  Substance Use Topics  . Alcohol use: Yes    Comment: rare  . Drug use: Yes    Types: Marijuana     Colonoscopy:  PAP:  Bone density:  Lipid panel:  Allergies  Allergen Reactions  . Codeine Rash    Happened in 1970s    Current Outpatient Medications  Medication Sig Dispense Refill  . acetaminophen (TYLENOL) 325 MG tablet Take 650 mg by mouth every 6 (six) hours as needed.    Marland Kitchen albuterol (VENTOLIN HFA) 108 (90 Base) MCG/ACT inhaler Inhale into the lungs.    Marland Kitchen atorvastatin (LIPITOR) 40 MG tablet Take 40 mg by mouth daily.    . clonazePAM (KLONOPIN) 1 MG tablet Take 1 mg by mouth 2 (two) times daily.    . fenofibrate 54 MG tablet Take 54 mg by mouth daily.    Marland Kitchen gabapentin (NEURONTIN) 600 MG tablet Take by mouth 2 (two) times daily.    . insulin lispro (HUMALOG) 100 UNIT/ML injection Inject 1-10 Units into the skin 3 (three) times daily before meals. Per sliding scale    . LANTUS SOLOSTAR 100 UNIT/ML Solostar Pen Inject 12 Units into the skin at bedtime.    . lidocaine-prilocaine (EMLA) cream Apply to affected area once 30 g 3  . lisinopril (ZESTRIL) 20 MG tablet Take 20 mg by mouth daily.    Marland Kitchen loperamide (IMODIUM) 2 MG capsule Take 2 tabs by mouth with first loose stool, then 1 tab with each additional loose stool as needed. Do not exceed 8 tabs in a 24-hour period 60 capsule 2  . Magnesium Citrate 200 MG TABS Take 1 tablet by mouth in the morning and at bedtime. 250 twice daily    . metoprolol succinate (TOPROL-XL) 50 MG 24 hr tablet Take 50 mg by mouth daily. Take with or immediately following a meal.    . ranolazine (RANEXA) 500 MG 12 hr tablet Take 500 mg by mouth 2 (two) times daily.    . sitaGLIPtin-metformin (JANUMET) 50-1000 MG tablet Take 1 tablet by mouth daily.    Marland Kitchen  venlafaxine XR (EFFEXOR-XR) 75 MG 24 hr capsule Take 75 mg by mouth daily.    . pantoprazole (PROTONIX) 40 MG tablet Take 1 tablet (40 mg total) by mouth daily. 30 tablet 5   No current facility-administered medications for this visit.   Facility-Administered Medications Ordered in Other Visits  Medication Dose Route Frequency Provider Last Rate Last Admin  . fluorouracil (ADRUCIL) chemo injection 650 mg  400 mg/m2 (Treatment Plan Recorded) Intravenous Once Jeralyn Ruths, MD      . leucovorin 664 mg in sodium chloride 0.9 % 250 mL infusion  400 mg/m2 (Treatment Plan Recorded) Intravenous Once Jeralyn Ruths, MD        OBJECTIVE: Vitals:   07/07/23 0834  BP: (!) 145/77  Pulse: 96  Temp: 98.5 F (36.9 C)  SpO2: 97%      Body mass index is 22.66 kg/m.    ECOG FS:2 - Symptomatic, <50% confined to bed  General: Well-developed, well-nourished, no acute distress.  Sitting in a  wheelchair. Accompanied by spouse Eyes: Pink conjunctiva, anicteric sclera. HEENT: Normocephalic, moist mucous membranes. Lungs: No audible wheezing or coughing. Heart: Regular rate and rhythm. Abdomen: Soft, nontender, no obvious distention. Musculoskeletal: No edema, cyanosis, or clubbing. Neuro: Alert, answering all questions appropriately. Cranial nerves grossly intact. Skin: No rashes or petechiae noted. Psych: Normal affect.  LAB RESULTS: Lab Results  Component Value Date   NA 138 07/07/2023   K 3.8 07/07/2023   CL 105 07/07/2023   CO2 25 07/07/2023   GLUCOSE 133 (H) 07/07/2023   BUN 9 07/07/2023   CREATININE 0.86 07/07/2023   CALCIUM 9.3 07/07/2023   PROT 6.8 07/07/2023   ALBUMIN 3.7 07/07/2023   AST 18 07/07/2023   ALT 13 07/07/2023   ALKPHOS 43 07/07/2023   BILITOT 0.3 07/07/2023   GFRNONAA >60 07/07/2023   Lab Results  Component Value Date   WBC 3.9 (L) 07/07/2023   NEUTROABS 2.8 07/07/2023   HGB 9.5 (L) 07/07/2023   HCT 29.9 (L) 07/07/2023   MCV 91.7 07/07/2023   PLT  202 07/07/2023            Component Ref Range & Units 2 wk ago 4 wk ago 1 mo ago 2 mo ago 3 mo ago 6 mo ago 1 yr ago  CEA 0.0 - 4.7 ng/mL 123.0 High  152.0 High  CM 139.0 High  CM 181.0 High  CM 125.0 High  CM 18.6 High  CM 171.0 High  CM     06/26/23- Surgical Pathology:  Diagnosis 1. Duodenum, Biopsy, cbxs - DUODENAL MUCOSA WITHOUT DIAGNOSTIC ABNORMALITY - NEGATIVE FOR CELIAC CHANGE OR MALIGNANCY 2. Stomach, biopsy, mass, cbxs - METASTATIC ADENOCARCINOMA CONSISTENT WITH PATIENT'S KNOWN COLORECTAL ADENOCARCINOMA - SEE NOTE 3. Colon, biopsy, anastomosis, cbxs - COLONIC MUCOSA WITH REACTIVE CHANGES CONSISTENT WITH ANASTOMOTIC SITE - FOCAL ISCHEMIC COLITIS - NEGATIVE FOR MALIGNANCY - SEE NOTE 4. Descending Colon Polyp, cold snare - TUBULAR ADENOMA (1 FRAGMENT) - NEGATIVE FOR HIGH-GRADE DYSPLASIA OR MALIGNANCY 2. Sections of the gastric biopsy show metastatic adenocarcinoma focally, involving the submucosa. Focal extracellular mucin is also present. Immunohistochemical stains are performed on block 2A. The tumor cells are positive for CDX2 and CK20, while negative for CK7. The overall morphologic and immunohistochemical features are consistent with the above diagnosis. Dr. [name] was notified via secure chat on 07/02/2023. 2-3. Dr. Oneita Kras reviewed the case and agrees with the above diagnoses.   STUDIES: No results found.  ASSESSMENT: Stage IIIc colon cancer. S/p complete surgical resection at [facility] on 05/23/22. Large colon mass was noted to partially invade stomach as well as surrounding mesentery. PET scan did not reveal any metastatic disease other than positive lung nodule which was confirmed to represent a second primary. She completed adjuvant FOLFOX 12/23/22.   PLAN:    Recurrent Stage IIIc colon cancer: s/p surgical resection 05/23/22 at [facility]. Completed adjuvant FOLFOX 12/23/22. Patient's preop CEA was 171 and decreased to 18.6. Was noted to be rising to 181. CT suspicious for  progressive disease. PET 05/20/23 consistent with recurrent disease with hypermetabolic activity   . CEA has increased from 18.6 to 181.0. CT scan reported as above highly suspicious of progressive disease.  PET scan results from May 20, 2023 with clear progression of disease.  Patient will have an appointment with [facility] for possible surgical resection at the end of the month. 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 Biopsies of duodenum, colon, and  descending colon were negative for malignancy.  Appetite changes Goals of care-   NGS at [facility] on original tumor? Not surgical candidate per [facility]. [facility] cancelled appt at end of month. Add avastin at next cycle. pMMR treatment or dMMR or POLE mutation.     Add appointment with josh when she returns for pump off - new palliative care visit 2 weeks- port/lab, Dr [name], +/- folfiri. Pump off on day 3- la   Colonoscopy and EGD are scheduled for this Friday.  Proceed with cycle 2 of FOLFIRI today.  Continue to hold Avastin.  Return to clinic in 2 days for pump removal and then in 2 weeks for further evaluation and consideration of cycle 3.   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  Patient completed XRT in May 2023.  PET scan as above with no obvious evidence of recurrence.   Pain: Resolved. Anemia: Hemoglobin continues to slowly trend down and is now 9.0, monitor. Leukopenia: Mild, monitor. Neuropathy/falls: Patient does not complain of this today.  Patient was previously given a referral to Occupational Therapy.   Patient expressed understanding and was in agreement with this plan. She also understands that She can call clinic at any time with any questions, concerns, or complaints.    Cancer Staging  Colon cancer [facility]) Staging form: Colon and Rectum, AJCC  8th Edition - Pathologic stage from 06/10/2022: Stage IIIC (pT4b, pN1c, cM0) - Signed by Jeralyn Ruths, MD on 06/10/2022 Stage prefix: Initial diagnosis Total positive nodes: 0 Histologic grading system: 4 grade system Histologic grade (G): G3   Alinda Dooms, NP   07/07/2023 8:58 AM

## 2023-07-08 ENCOUNTER — Encounter: Payer: Self-pay | Admitting: Oncology

## 2023-07-09 ENCOUNTER — Encounter: Payer: Self-pay | Admitting: Oncology

## 2023-07-09 ENCOUNTER — Inpatient Hospital Stay (HOSPITAL_BASED_OUTPATIENT_CLINIC_OR_DEPARTMENT_OTHER): Payer: Medicare Other | Admitting: Hospice and Palliative Medicine

## 2023-07-09 ENCOUNTER — Inpatient Hospital Stay: Payer: Medicare Other

## 2023-07-09 ENCOUNTER — Encounter: Payer: Self-pay | Admitting: Hospice and Palliative Medicine

## 2023-07-09 VITALS — BP 136/76 | HR 90 | Resp 18

## 2023-07-09 VITALS — BP 133/78 | HR 87 | Temp 97.9°F | Resp 16 | Ht 64.0 in | Wt 132.4 lb

## 2023-07-09 DIAGNOSIS — C184 Malignant neoplasm of transverse colon: Secondary | ICD-10-CM

## 2023-07-09 DIAGNOSIS — Z5111 Encounter for antineoplastic chemotherapy: Secondary | ICD-10-CM | POA: Diagnosis not present

## 2023-07-09 MED ORDER — OLANZAPINE 10 MG PO TABS: 5.0000 mg | ORAL_TABLET | Freq: Every evening | ORAL | 1 refills | Status: DC | PRN

## 2023-07-09 MED ORDER — SODIUM CHLORIDE 0.9% FLUSH
10.0000 mL | INTRAVENOUS | Status: DC | PRN
  Administered 2023-07-09: 10 mL
  Filled 2023-07-09: qty 10

## 2023-07-09 MED ORDER — HEPARIN SOD (PORK) LOCK FLUSH 100 UNIT/ML IV SOLN
500.0000 [IU] | Freq: Once | INTRAVENOUS | Status: AC | PRN
  Administered 2023-07-09: 500 [IU]
  Filled 2023-07-09: qty 5

## 2023-07-09 NOTE — Progress Notes (Signed)
[facility] CD[facility], PD-L1 requested on [id number], stomach, collected 06/26/23.

## 2023-07-09 NOTE — Progress Notes (Signed)
Palliative Medicine [facility] [phone] Fax:(336) [phone]   Name: Jessica Stout Date: 07/09/2023 MRN: [id number]  DOB: [birth year]  Patient Care Team: Jaclyn Shaggy, MD as PCP - General (Internal Medicine) Debbe Odea, MD as PCP - Cardiology (Cardiology) Benita Gutter, RN as Oncology Nurse Navigator [name], Tollie Pizza, MD as Consulting Physician (Oncology)    REASON FOR CONSULTATION: Jessica Stout is a 71 y.o. 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  Palliative care was consulted to address goals.  SOCIAL HISTORY:     reports that she quit smoking about 7 months ago. Her smoking use included e-cigarettes. She does not have any smokeless tobacco history on file. She reports current alcohol use. She reports current drug use. Drug: Marijuana.  Patient is married and lives at home with her husband and two cats.  She has a son who lives nearby and is involved.  Patient previously worked as a Neurosurgeon.  ADVANCE DIRECTIVES:  None on file  CODE STATUS:   PAST MEDICAL HISTORY: Past Medical History:  Diagnosis Date   Anemia    Anxiety    Asthma    Cancer (HCC)    colon   CHF (congestive heart failure) (HCC)    COPD (chronic obstructive pulmonary disease) (HCC)    Diabetes mellitus without complication (HCC)    Hyperlipidemia    Hypertension    Stroke [facility])     PAST SURGICAL HISTORY:  Past Surgical History:  Procedure Laterality Date   BIOPSY  06/26/2023   Procedure: BIOPSY;  Surgeon: Regis Bill, MD;  Location: [facility] ENDOSCOPY;  Service: Endoscopy;;   COLONOSCOPY N/A 06/26/2023    Procedure: COLONOSCOPY;  Surgeon: Regis Bill, MD;  Location: [facility] ENDOSCOPY;  Service: Endoscopy;  Laterality: N/A;   COLONOSCOPY WITH PROPOFOL N/A 02/03/2022   Procedure: COLONOSCOPY WITH PROPOFOL;  Surgeon: Toney Reil, MD;  Location: [facility] ENDOSCOPY;  Service: Gastroenterology;  Laterality: N/A;   ESOPHAGOGASTRODUODENOSCOPY N/A 06/26/2023   Procedure: ESOPHAGOGASTRODUODENOSCOPY (EGD);  Surgeon: Regis Bill, MD;  Location: [facility] ENDOSCOPY;  Service: Endoscopy;  Laterality: N/A;   ESOPHAGOGASTRODUODENOSCOPY (EGD) WITH PROPOFOL N/A 02/03/2022   Procedure: ESOPHAGOGASTRODUODENOSCOPY (EGD) WITH PROPOFOL;  Surgeon: Toney Reil, MD;  Location: [facility] ENDOSCOPY;  Service: Gastroenterology;  Laterality: N/A;   GIVENS CAPSULE STUDY N/A 02/03/2022   Procedure: GIVENS CAPSULE STUDY;  Surgeon: Toney Reil, MD;  Location[facility] ENDOSCOPY;  Service: Gastroenterology;  Laterality: N/A;  possible Capsule Study   IR IMAGING GUIDED PORT INSERTION  06/18/2022   POLYPECTOMY  06/26/2023   Procedure: POLYPECTOMY;  Surgeon: Regis Bill, MD;  Location: [facility] ENDOSCOPY;  Service: Endoscopy;;    HEMATOLOGY/ONCOLOGY HISTORY:  Oncology History  Colon cancer (HCC)  02/04/2022 Initial Diagnosis   Colon cancer (HCC)   06/10/2022 Cancer Staging   Staging form: Colon and Rectum, AJCC 8th Edition - Pathologic stage from 06/10/2022: Stage IIIC (pT4b, pN1c, cM0) - Signed by Jeralyn Ruths, MD on 06/10/2022 Stage prefix: Initial diagnosis Total positive nodes: 0 Histologic grading system: 4 grade system Histologic grade (G): G3   06/24/2022 - 07/08/2022 Chemotherapy   Patient is on Treatment Plan : COLORECTAL FOLFOX q14d x 6 months  06/24/2022 - 12/25/2022 Chemotherapy   Patient is on Treatment Plan : COLORECTAL FOLFOX q14d x 6 months     06/09/2023 -  Chemotherapy   Patient is on Treatment Plan : COLORECTAL FOLFIRI q14d       ALLERGIES:  is allergic to codeine.  MEDICATIONS:   Current Outpatient Medications  Medication Sig Dispense Refill   acetaminophen (TYLENOL) 325 MG tablet Take 650 mg by mouth every 6 (six) hours as needed.     albuterol (VENTOLIN HFA) 108 (90 Base) MCG/ACT inhaler Inhale into the lungs.     atorvastatin (LIPITOR) 40 MG tablet Take 40 mg by mouth daily.     clonazePAM (KLONOPIN) 1 MG tablet Take 1 mg by mouth 2 (two) times daily.     fenofibrate 54 MG tablet Take 54 mg by mouth daily.     gabapentin (NEURONTIN) 600 MG tablet Take by mouth 2 (two) times daily.     insulin lispro (HUMALOG) 100 UNIT/ML injection Inject 1-10 Units into the skin 3 (three) times daily before meals. Per sliding scale     LANTUS SOLOSTAR 100 UNIT/ML Solostar Pen Inject 12 Units into the skin at bedtime.     lidocaine-prilocaine (EMLA) cream Apply to affected area once 30 g 3   lisinopril (ZESTRIL) 20 MG tablet Take 20 mg by mouth daily.     loperamide (IMODIUM) 2 MG capsule Take 2 tabs by mouth with first loose stool, then 1 tab with each additional loose stool as needed. Do not exceed 8 tabs in a 24-hour period 60 capsule 2   Magnesium Citrate 200 MG TABS Take 1 tablet by mouth in the morning and at bedtime. 250 twice daily     metoprolol succinate (TOPROL-XL) 50 MG 24 hr tablet Take 50 mg by mouth daily. Take with or immediately following a meal.     ranolazine (RANEXA) 500 MG 12 hr tablet Take 500 mg by mouth 2 (two) times daily.     sitaGLIPtin-metformin (JANUMET) 50-1000 MG tablet Take 1 tablet by mouth daily.     venlafaxine XR (EFFEXOR-XR) 75 MG 24 hr capsule Take 75 mg by mouth daily.     pantoprazole (PROTONIX) 40 MG tablet Take 1 tablet (40 mg total) by mouth daily. 30 tablet 5   No current facility-administered medications for this visit.    VITAL SIGNS: BP 133/78 (BP Location: Left Arm, Patient Position: Sitting, Cuff Size: Normal)   Pulse 87   Temp 97.9 F (36.6 C) (Tympanic)   Resp 16   Ht 5\' 4"  (1.626 m)   Wt 132 lb 6.4 oz (60.1 kg)   SpO2 98%    BMI 22.73 kg/m  Filed Weights   07/09/23 1415  Weight: 132 lb 6.4 oz (60.1 kg)    Estimated body mass index is 22.73 kg/m as calculated from the following:   Height as of this encounter: 5\' 4"  (1.626 m).   Weight as of this encounter: 132 lb 6.4 oz (60.1 kg).  LABS: CBC:    Component Value Date/Time   WBC 3.9 (L) 07/07/2023 0820   WBC 7.0 04/30/2023 0820   HGB 9.5 (L) 07/07/2023 0820   HCT 29.9 (L) 07/07/2023 0820   PLT 202 07/07/2023 0820   MCV 91.7 07/07/2023 0820   NEUTROABS 2.8 07/07/2023 0820   LYMPHSABS 0.6 (L) 07/07/2023 0820   MONOABS 0.5 07/07/2023 0820   EOSABS 0.1 07/07/2023 0820   BASOSABS 0.0 07/07/2023 0820   Comprehensive Metabolic Panel:    Component Value  Date/Time   NA 138 07/07/2023 0820   K 3.8 07/07/2023 0820   CL 105 07/07/2023 0820   CO2 25 07/07/2023 0820   BUN 9 07/07/2023 0820   CREATININE 0.86 07/07/2023 0820   GLUCOSE 133 (H) 07/07/2023 0820   CALCIUM 9.3 07/07/2023 0820   AST 18 07/07/2023 0820   ALT 13 07/07/2023 0820   ALKPHOS 43 07/07/2023 0820   BILITOT 0.3 07/07/2023 0820   PROT 6.8 07/07/2023 0820   ALBUMIN 3.7 07/07/2023 0820    RADIOGRAPHIC STUDIES: No results found.  PERFORMANCE STATUS (ECOG) : 1 - Symptomatic but completely ambulatory  Review of Systems Unless otherwise noted, a complete review of systems is negative.  Physical Exam General: NAD Pulmonary: Unlabored Extremities: no edema, no joint deformities Skin: no rashes Neurological: Grossly nonfocal  IMPRESSION: PET scan 05/20/2023 consistent with recurrent disease with hypermetabolism in inferior gastric body and small bowel anastomosis.  She was started on FOLFIRI chemotherapy.  EGD on 06/26/2023 confirmed metastatic adenocarcinoma on stomach biopsy.  Patient was felt not to be a surgical candidate.  I met with patient and husband today.  Symptomatically, she endorses intermittent nausea and takes ondansetron for that.  However, patient also endorses poor  appetite and insomnia.  Discussed trial of olanzapine to see if this helps improve symptom burden.  She denies significant pain.  She does endorse intermittent tearfulness.  She is on Effexor, which could be dose escalated if needed.  I recommended referral to LCSW but patient wanted to hold off for now.  PLAN: -Continue current scope of treatment -Trial of olanzapine 5 to 10 mg nightly as needed -Would benefit from readdressing ACP in the future -Follow-up telephone visit 1 month   Patient expressed understanding and was in agreement with this plan. She also understands that She can call the clinic at any time with any questions, concerns, or complaints.     Time Total: 20 minutes  Visit consisted of counseling and education dealing with the complex and emotionally intense issues of symptom management and palliative care in the setting of serious and potentially life-threatening illness.Greater than 50%  of this time was spent counseling and coordinating care related to the above assessment and plan.  Signed by: Laurette Schimke, PhD, NP-C

## 2023-07-13 ENCOUNTER — Other Ambulatory Visit: Payer: Self-pay | Admitting: Oncology

## 2023-07-14 ENCOUNTER — Telehealth: Payer: Self-pay

## 2023-07-14 NOTE — Telephone Encounter (Signed)
[facility] sent notification that alternate specimen is needed for [facility] at [facility].

## 2023-07-20 ENCOUNTER — Encounter: Payer: Self-pay | Admitting: Oncology

## 2023-07-20 MED ORDER — FLUOROURACIL CHEMO INJECTION 2.5 GM/50ML
400.0000 mg/m2 | Freq: Once | INTRAVENOUS | Status: AC
  Administered 2023-07-21: 650 mg via INTRAVENOUS
  Filled 2023-07-20: qty 13

## 2023-07-20 MED ORDER — SODIUM CHLORIDE 0.9 % IV SOLN
400.0000 mg/m2 | Freq: Once | INTRAVENOUS | Status: AC
  Administered 2023-07-21: 664 mg via INTRAVENOUS
  Filled 2023-07-20: qty 33.2

## 2023-07-20 MED FILL — Dexamethasone Sodium Phosphate Inj 100 MG/10ML: INTRAMUSCULAR | Qty: 1 | Status: AC

## 2023-07-21 ENCOUNTER — Inpatient Hospital Stay (HOSPITAL_BASED_OUTPATIENT_CLINIC_OR_DEPARTMENT_OTHER): Payer: Medicare Other | Admitting: Oncology

## 2023-07-21 ENCOUNTER — Encounter: Payer: Self-pay | Admitting: Oncology

## 2023-07-21 ENCOUNTER — Inpatient Hospital Stay: Payer: Medicare Other

## 2023-07-21 VITALS — HR 98

## 2023-07-21 DIAGNOSIS — C184 Malignant neoplasm of transverse colon: Secondary | ICD-10-CM

## 2023-07-21 DIAGNOSIS — Z5111 Encounter for antineoplastic chemotherapy: Secondary | ICD-10-CM | POA: Diagnosis not present

## 2023-07-21 LAB — CMP (CANCER CENTER ONLY)
ALT: 14 U/L (ref 0–44)
AST: 17 U/L (ref 15–41)
Albumin: 3.7 g/dL (ref 3.5–5.0)
Alkaline Phosphatase: 50 U/L (ref 38–126)
Anion gap: 8 (ref 5–15)
BUN: 27 mg/dL — ABNORMAL HIGH (ref 8–23)
CO2: 24 mmol/L (ref 22–32)
Calcium: 9.3 mg/dL (ref 8.9–10.3)
Chloride: 104 mmol/L (ref 98–111)
Creatinine: 1.05 mg/dL — ABNORMAL HIGH (ref 0.44–1.00)
GFR, Estimated: 56 mL/min — ABNORMAL LOW (ref >60)
Glucose, Bld: 154 mg/dL — ABNORMAL HIGH (ref 70–99)
Potassium: 3.9 mmol/L (ref 3.5–5.1)
Sodium: 136 mmol/L (ref 135–145)
Total Bilirubin: 0.4 mg/dL (ref 0.3–1.2)
Total Protein: 7.2 g/dL (ref 6.5–8.1)

## 2023-07-21 LAB — CBC WITH DIFFERENTIAL (CANCER CENTER ONLY)
Abs Immature Granulocytes: 0.02 10*3/uL (ref 0.00–0.07)
Basophils Absolute: 0 10*3/uL (ref 0.0–0.1)
Basophils Relative: 0 %
Eosinophils Absolute: 0.1 10*3/uL (ref 0.0–0.5)
Eosinophils Relative: 1 %
HCT: 30.2 % — ABNORMAL LOW (ref 36.0–46.0)
Hemoglobin: 9.4 g/dL — ABNORMAL LOW (ref 12.0–15.0)
Immature Granulocytes: 0 %
Lymphocytes Relative: 9 %
Lymphs Abs: 0.6 10*3/uL — ABNORMAL LOW (ref 0.7–4.0)
MCH: 28.8 pg (ref 26.0–34.0)
MCHC: 31.1 g/dL (ref 30.0–36.0)
MCV: 92.6 fL (ref 80.0–100.0)
Monocytes Absolute: 0.6 10*3/uL (ref 0.1–1.0)
Monocytes Relative: 10 %
Neutro Abs: 5.4 10*3/uL (ref 1.7–7.7)
Neutrophils Relative %: 80 %
Platelet Count: 198 10*3/uL (ref 150–400)
RBC: 3.26 MIL/uL — ABNORMAL LOW (ref 3.87–5.11)
RDW: 16.6 % — ABNORMAL HIGH (ref 11.5–15.5)
WBC Count: 6.8 10*3/uL (ref 4.0–10.5)
nRBC: 0 % (ref 0.0–0.2)

## 2023-07-21 LAB — MAGNESIUM: Magnesium: 2.1 mg/dL (ref 1.7–2.4)

## 2023-07-21 MED ORDER — SODIUM CHLORIDE 0.9 % IV SOLN
2400.0000 mg/m2 | INTRAVENOUS | Status: DC
  Administered 2023-07-21: 4000 mg via INTRAVENOUS
  Filled 2023-07-21: qty 80

## 2023-07-21 MED ORDER — PALONOSETRON HCL INJECTION 0.25 MG/5ML
0.2500 mg | Freq: Once | INTRAVENOUS | Status: AC
  Administered 2023-07-21: 0.25 mg via INTRAVENOUS
  Filled 2023-07-21: qty 5

## 2023-07-21 MED ORDER — SODIUM CHLORIDE 0.9 % IV SOLN
Freq: Once | INTRAVENOUS | Status: AC
  Filled 2023-07-21: qty 250

## 2023-07-21 MED ORDER — PROCHLORPERAZINE MALEATE 10 MG PO TABS
10.0000 mg | ORAL_TABLET | Freq: Once | ORAL | Status: AC
  Administered 2023-07-21: 10 mg via ORAL
  Filled 2023-07-21: qty 1

## 2023-07-21 MED ORDER — SODIUM CHLORIDE 0.9 % IV SOLN
10.0000 mg | Freq: Once | INTRAVENOUS | Status: AC
  Administered 2023-07-21: 10 mg via INTRAVENOUS
  Filled 2023-07-21: qty 10

## 2023-07-21 MED ORDER — SODIUM CHLORIDE 0.9 % IV SOLN
180.0000 mg/m2 | Freq: Once | INTRAVENOUS | Status: AC
  Administered 2023-07-21: 300 mg via INTRAVENOUS
  Filled 2023-07-21: qty 15

## 2023-07-21 MED ORDER — ATROPINE SULFATE 1 MG/ML IV SOLN
0.5000 mg | Freq: Once | INTRAVENOUS | Status: AC | PRN
  Administered 2023-07-21: 0.5 mg via INTRAVENOUS
  Filled 2023-07-21: qty 1

## 2023-07-21 NOTE — Progress Notes (Signed)
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  INTERVAL HISTORY: Patient returns to clinic today for further evaluation and consideration of cycle 3 of FOLFIRI.  She continues to have chronic weakness and fatigue, but otherwise feels well.  She has a chronic peripheral neuropathy, but no other neurologic complaints.  She denies any recent fevers or illnesses.  She has no chest pain, shortness of breath, cough, or hemoptysis.  She has no abdominal pain.  She denies any nausea, vomiting, constipation, or diarrhea.  She has no melena or hematochezia.  She has no urinary complaints.  Patient offers no further specific complaints today.  REVIEW OF SYSTEMS:   Review of Systems  Constitutional:  Positive for malaise/fatigue. Negative for fever and weight loss.  Respiratory: Negative.  Negative for cough, hemoptysis and shortness of breath.   Cardiovascular: Negative.  Negative for chest pain and leg swelling.  Gastrointestinal: Negative.  Negative for abdominal pain, blood in stool, constipation, diarrhea, melena, nausea and vomiting.  Genitourinary: Negative.  Negative for dysuria.  Musculoskeletal: Negative.  Negative for back pain and falls.  Skin: Negative.  Negative for rash.  Neurological:  Positive for sensory change and weakness. Negative for dizziness, focal weakness and headaches.  Psychiatric/Behavioral: Negative.  The patient is not nervous/anxious.     As per HPI. Otherwise, a complete review of systems is negative.  PAST MEDICAL HISTORY: Past Medical History:   Diagnosis Date   Anemia    Anxiety    Asthma    Cancer (HCC)    colon   CHF (congestive heart failure) (HCC)    COPD (chronic obstructive pulmonary disease) (HCC)    Diabetes mellitus without complication (HCC)    Hyperlipidemia    Hypertension    Stroke [facility])     PAST SURGICAL HISTORY: Past Surgical History:  Procedure Laterality Date   BIOPSY  06/26/2023   Procedure: BIOPSY;  Surgeon: Regis Bill, MD;  Location: [facility] ENDOSCOPY;  Service: Endoscopy;;   COLONOSCOPY N/A 06/26/2023   Procedure: COLONOSCOPY;  Surgeon: Regis Bill, MD;  Location: [facility] ENDOSCOPY;  Service: Endoscopy;  Laterality: N/A;   COLONOSCOPY WITH PROPOFOL N/A 02/03/2022   Procedure: COLONOSCOPY WITH PROPOFOL;  Surgeon: Toney Reil, MD;  Location: [facility] ENDOSCOPY;  Service: Gastroenterology;  Laterality: N/A;   ESOPHAGOGASTRODUODENOSCOPY N/A 06/26/2023   Procedure: ESOPHAGOGASTRODUODENOSCOPY (EGD);  Surgeon: Regis Bill, MD;  Location: [facility] ENDOSCOPY;  Service: Endoscopy;  Laterality: N/A;   ESOPHAGOGASTRODUODENOSCOPY (EGD) WITH PROPOFOL N/A 02/03/2022   Procedure: ESOPHAGOGASTRODUODENOSCOPY (EGD) WITH PROPOFOL;  Surgeon: Toney Reil, MD;  Location: [facility] ENDOSCOPY;  Service: Gastroenterology;  Laterality: N/A;   GIVENS CAPSULE STUDY N/A 02/03/2022   Procedure: GIVENS CAPSULE STUDY;  Surgeon: Toney Reil, MD;  Location: [facility] ENDOSCOPY;  Service: Gastroenterology;  Laterality: N/A;  possible Capsule Study   IR IMAGING GUIDED PORT INSERTION  06/18/2022   POLYPECTOMY  06/26/2023   Procedure: POLYPECTOMY;  Surgeon: Regis Bill, MD;  Location: [facility] ENDOSCOPY;  Service: Endoscopy;;    FAMILY HISTORY: Family History  Problem Relation Age of Onset   Diabetes Mother    Heart attack Father  Colon cancer Father     ADVANCED DIRECTIVES (Y/N):  N  HEALTH MAINTENANCE: Social History   Tobacco Use   Smoking status: Former    Types: E-cigarettes    Quit date: 12/08/2022     Years since quitting: 0.6   Tobacco comments:    Quit Cigarettes 12/2021  Vaping Use   Vaping status: Every Day   Substances: Nicotine, THC  Substance Use Topics   Alcohol use: Yes    Comment: rare   Drug use: Yes    Types: Marijuana     Colonoscopy:  PAP:  Bone density:  Lipid panel:  Allergies  Allergen Reactions   Codeine Rash    Happened in 1970s    Current Outpatient Medications  Medication Sig Dispense Refill   acetaminophen (TYLENOL) 325 MG tablet Take 650 mg by mouth every 6 (six) hours as needed.     albuterol (VENTOLIN HFA) 108 (90 Base) MCG/ACT inhaler Inhale into the lungs.     atorvastatin (LIPITOR) 40 MG tablet Take 40 mg by mouth daily.     clonazePAM (KLONOPIN) 1 MG tablet Take 1 mg by mouth 2 (two) times daily.     fenofibrate 54 MG tablet Take 54 mg by mouth daily.     gabapentin (NEURONTIN) 600 MG tablet Take by mouth 2 (two) times daily.     insulin lispro (HUMALOG) 100 UNIT/ML injection Inject 1-10 Units into the skin 3 (three) times daily before meals. Per sliding scale     LANTUS SOLOSTAR 100 UNIT/ML Solostar Pen Inject 12 Units into the skin at bedtime.     lidocaine-prilocaine (EMLA) cream Apply to affected area once 30 g 3   lisinopril (ZESTRIL) 20 MG tablet Take 20 mg by mouth daily.     loperamide (IMODIUM) 2 MG capsule Take 2 tabs by mouth with first loose stool, then 1 tab with each additional loose stool as needed. Do not exceed 8 tabs in a 24-hour period 60 capsule 2   Magnesium Citrate 200 MG TABS Take 1 tablet by mouth in the morning and at bedtime. 250 twice daily     metoprolol succinate (TOPROL-XL) 50 MG 24 hr tablet Take 50 mg by mouth daily. Take with or immediately following a meal.     ranolazine (RANEXA) 500 MG 12 hr tablet Take 500 mg by mouth 2 (two) times daily.     sitaGLIPtin-metformin (JANUMET) 50-1000 MG tablet Take 1 tablet by mouth daily.     venlafaxine XR (EFFEXOR-XR) 75 MG 24 hr capsule Take 75 mg by mouth daily.      OLANZapine (ZYPREXA) 10 MG tablet Take 0.5-1 tablets (5-10 mg total) by mouth at bedtime as needed (nausea). (Patient not taking: Reported on 07/21/2023) 30 tablet 1   pantoprazole (PROTONIX) 40 MG tablet Take 1 tablet (40 mg total) by mouth daily. 30 tablet 5   No current facility-administered medications for this visit.   Facility-Administered Medications Ordered in Other Visits  Medication Dose Route Frequency Provider Last Rate Last Admin   fluorouracil (ADRUCIL) 4,000 mg in sodium chloride 0.9 % 70 mL chemo infusion  2,400 mg/m2 (Treatment Plan Recorded) Intravenous 1 day or 1 dose [name], Tollie Pizza, MD       fluorouracil (ADRUCIL) chemo injection 650 mg  400 mg/m2 (Treatment Plan Recorded) Intravenous Once Jeralyn Ruths, MD        OBJECTIVE: Vitals:   07/21/23 0849  BP: 113/73  Pulse: (!) 103  Resp: 16  Temp: 97.8 F (36.6  C)  SpO2: 96%       Body mass index is 22.28 kg/m.    ECOG FS:1 - Symptomatic but completely ambulatory  General: Well-developed, well-nourished, no acute distress.  Sitting in wheelchair. Eyes: Pink conjunctiva, anicteric sclera. HEENT: Normocephalic, moist mucous membranes. Lungs: No audible wheezing or coughing. Heart: Regular rate and rhythm. Abdomen: Soft, nontender, no obvious distention. Musculoskeletal: No edema, cyanosis, or clubbing. Neuro: Alert, answering all questions appropriately. Cranial nerves grossly intact. Skin: No rashes or petechiae noted. Psych: Normal affect.  LAB RESULTS:  Lab Results  Component Value Date   NA 136 07/21/2023   K 3.9 07/21/2023   CL 104 07/21/2023   CO2 24 07/21/2023   GLUCOSE 154 (H) 07/21/2023   BUN 27 (H) 07/21/2023   CREATININE 1.05 (H) 07/21/2023   CALCIUM 9.3 07/21/2023   PROT 7.2 07/21/2023   ALBUMIN 3.7 07/21/2023   AST 17 07/21/2023   ALT 14 07/21/2023   ALKPHOS 50 07/21/2023   BILITOT 0.4 07/21/2023   GFRNONAA 56 (L) 07/21/2023    Lab Results  Component Value Date   WBC 6.8  07/21/2023   NEUTROABS 5.4 07/21/2023   HGB 9.4 (L) 07/21/2023   HCT 30.2 (L) 07/21/2023   MCV 92.6 07/21/2023   PLT 198 07/21/2023     STUDIES: No results found.  ASSESSMENT: Stage IIIc colon cancer.  PLAN:    Stage IIIc colon cancer: Patient underwent complete surgical resection at [facility] on May 23, 2022.  Large colon mass was noted to partially invade stomach as well as surrounding mesentery.  PET scan did not reveal any metastatic disease other than a positive lung nodule which was confirmed to be a second primary.  Patient completed her final cycle of adjuvant FOLFOX on December 23, 2022.  Patient CEA trended up to 181, but now has decreased to 122.  Today's result is pending. PET scan results from May 20, 2023 reviewed independently and reported as above with clear progression of disease.  Patient was seen at [facility] and determined not to be a surgical candidate.  Will proceed with cycle 3 of FOLFIRI today, and Avastin to her regimen for subsequent cycles.  Return to clinic in 2 days for pump removal and then in 2 weeks for further evaluation and consideration of cycle 4 which will be FOLFIRI plus Avastin.   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  Patient completed XRT in May 2023.  PET scan as above with no obvious evidence of recurrence.   Pain: Resolved. Anemia: Chronic and unchanged.  Patient's hemoglobin is 9.4.   Leukopenia: Resolved. Neuropathy/falls: Patient does not complain of this today.  Patient was previously given a referral to Occupational Therapy. Coping/anxiety: Patient was given a referral to [facility].   Patient expressed understanding and was in agreement with this plan. She also understands that She can call clinic at any time with any questions, concerns, or complaints.    Cancer Staging  Colon cancer [facility] Staging form: Colon and Rectum, AJCC 8th Edition - Pathologic  stage from 06/10/2022: Stage IIIC (pT4b, pN1c, cM0) - Signed by Jeralyn Ruths, MD on 06/10/2022 Stage prefix: Initial diagnosis Total positive nodes: 0 Histologic grading system: 4 grade system Histologic grade (G): G3   Jeralyn Ruths, MD   07/21/2023 12:35 PM

## 2023-07-21 NOTE — Patient Instructions (Signed)
 [place] [facility]  Discharge Instructions: Thank you for choosing [facility] to provide your oncology and hematology care.  If you have a lab appointment with the Cancer Center, please go directly to the Cancer Center and check in at the registration area.  Wear comfortable clothing and clothing appropriate for easy access to any Portacath or PICC line.   We strive to give you quality time with your provider. You may need to reschedule your appointment if you arrive late (15 or more minutes).  Arriving late affects you and other patients whose appointments are after yours.  Also, if you miss three or more appointments without notifying the office, you may be dismissed from the clinic at the provider's discretion.      For prescription refill requests, have your pharmacy contact our office and allow 72 hours for refills to be completed.    Today you received the following chemotherapy and/or immunotherapy agents Irinotecan, Leucovorin, &Adrucil      To help prevent nausea and vomiting after your treatment, we encourage you to take your nausea medication as directed.  BELOW ARE SYMPTOMS THAT SHOULD BE REPORTED IMMEDIATELY: *FEVER GREATER THAN 100.4 F (38 C) OR HIGHER *CHILLS OR SWEATING *NAUSEA AND VOMITING THAT IS NOT CONTROLLED WITH YOUR NAUSEA MEDICATION *UNUSUAL SHORTNESS OF BREATH *UNUSUAL BRUISING OR BLEEDING *URINARY PROBLEMS (pain or burning when urinating, or frequent urination) *BOWEL PROBLEMS (unusual diarrhea, constipation, pain near the anus) TENDERNESS IN MOUTH AND THROAT WITH OR WITHOUT PRESENCE OF ULCERS (sore throat, sores in mouth, or a toothache) UNUSUAL RASH, SWELLING OR PAIN  UNUSUAL VAGINAL DISCHARGE OR ITCHING   Items with * indicate a potential emergency and should be followed up as soon as possible or go to the Emergency Department if any problems should occur.  Please show the CHEMOTHERAPY ALERT CARD or IMMUNOTHERAPY  ALERT CARD at check-in to the Emergency Department and triage nurse.  Should you have questions after your visit or need to cancel or reschedule your appointment, please contact [place] [facility]  [phone] and follow the prompts.  Office hours are 8:00 a.m. to 4:30 p.m. Monday - Friday. Please note that voicemails left after 4:00 p.m. may not be returned until the following business day.  We are closed weekends and major holidays. You have access to a nurse at all times for urgent questions. Please call the main number to the clinic [phone] and follow the prompts.  For any non-urgent questions, you may also contact your provider using MyChart. We now offer e-Visits for anyone 65 and older to request care online for non-urgent symptoms. For details visit [email or web address].   Also download the MyChart app! Go to the app store, search "MyChart", open the app, select [place], and log in with your MyChart username and password.

## 2023-07-21 NOTE — Progress Notes (Signed)
Loss of appetite that has gotten worse in the last couple of weeks. Having a lot of nausea. Has been taking antimedics and it does help.

## 2023-07-22 LAB — CEA: CEA: 131 ng/mL — ABNORMAL HIGH (ref 0.0–4.7)

## 2023-07-23 ENCOUNTER — Inpatient Hospital Stay: Payer: Medicare Other

## 2023-07-23 VITALS — BP 130/77 | HR 110 | Temp 97.2°F | Resp 18

## 2023-07-23 DIAGNOSIS — Z5111 Encounter for antineoplastic chemotherapy: Secondary | ICD-10-CM | POA: Diagnosis not present

## 2023-07-23 DIAGNOSIS — C184 Malignant neoplasm of transverse colon: Secondary | ICD-10-CM

## 2023-07-23 MED ORDER — SODIUM CHLORIDE 0.9% FLUSH
10.0000 mL | INTRAVENOUS | Status: DC | PRN
  Administered 2023-07-23: 10 mL
  Filled 2023-07-23: qty 10

## 2023-07-23 MED ORDER — HEPARIN SOD (PORK) LOCK FLUSH 100 UNIT/ML IV SOLN
500.0000 [IU] | Freq: Once | INTRAVENOUS | Status: AC | PRN
  Administered 2023-07-23: 500 [IU]
  Filled 2023-07-23: qty 5

## 2023-07-24 ENCOUNTER — Encounter: Payer: Self-pay | Admitting: Oncology

## 2023-07-24 ENCOUNTER — Other Ambulatory Visit: Payer: Self-pay | Admitting: Oncology

## 2023-07-24 DIAGNOSIS — C184 Malignant neoplasm of transverse colon: Secondary | ICD-10-CM

## 2023-07-24 NOTE — Progress Notes (Signed)
DISCONTINUE OFF PATHWAY REGIMEN - Colorectal   [id number] (Leucovorin IV D1 + Fluorouracil IV D1/CIV D1,2 + Irinotecan IV D1) q14 Days:   A cycle is every 14 days:     Irinotecan      Leucovorin      Fluorouracil      Fluorouracil   **Always confirm dose/schedule in your pharmacy ordering system**  REASON: Other Reason PRIOR TREATMENT: Off Pathway: FOLFIRI (Leucovorin IV D1 + Fluorouracil IV D1/CIV D1,2 + Irinotecan IV D1) q14 Days TREATMENT RESPONSE: Partial Response (PR)  START OFF PATHWAY REGIMEN - Colorectal   [id number]:FOLFIRI + Bevacizumab (Leucovorin IV D1 + Fluorouracil IV D1/CIV D1,2 + Irinotecan IV D1 + Bevacizumab IV D1) q14 Days:   A cycle is every 14 days:     Bevacizumab-xxxx      Irinotecan      Leucovorin      Fluorouracil      Fluorouracil   **Always confirm dose/schedule in your pharmacy ordering system**  Patient Characteristics: Postoperative without Neoadjuvant Therapy, M0 (Pathologic Staging), Distant Metastasis Tumor Location: Colon Therapeutic Status: Postoperative without Neoadjuvant Therapy, M0 (Pathologic Staging) AJCC M Category: pM1c AJCC T Category: pT4b AJCC N Category: pN1c AJCC 8 Stage Grouping: IVC Intent of Therapy: Non-Curative / Palliative Intent, Discussed with Patient

## 2023-07-24 NOTE — Progress Notes (Signed)
Pharmacist Chemotherapy Monitoring - Initial Assessment    Anticipated start date: 08/04/23   The following has been reviewed per standard work regarding the patient's treatment regimen: The patient's diagnosis, treatment plan and drug doses, and organ/hematologic function Lab orders and baseline tests specific to treatment regimen  The treatment plan start date, drug sequencing, and pre-medications Prior authorization status  Patient's documented medication list, including drug-drug interaction screen and prescriptions for anti-emetics and supportive care specific to the treatment regimen The drug concentrations, fluid compatibility, administration routes, and timing of the medications to be used The patient's access for treatment and lifetime cumulative dose history, if applicable  The patient's medication allergies and previous infusion related reactions, if applicable   Changes made to treatment plan:  N/A  Follow up needed:  N/A   Stephens Shire, [facility], 07/24/2023  3:39 PM

## 2023-07-28 ENCOUNTER — Encounter: Payer: Self-pay | Admitting: Oncology

## 2023-08-04 ENCOUNTER — Encounter: Payer: Self-pay | Admitting: Oncology

## 2023-08-04 ENCOUNTER — Inpatient Hospital Stay (HOSPITAL_BASED_OUTPATIENT_CLINIC_OR_DEPARTMENT_OTHER): Payer: Medicare Other | Admitting: Oncology

## 2023-08-04 ENCOUNTER — Inpatient Hospital Stay: Payer: Medicare Other

## 2023-08-04 ENCOUNTER — Inpatient Hospital Stay: Payer: Medicare Other | Attending: Oncology

## 2023-08-04 VITALS — BP 131/64 | HR 87

## 2023-08-04 DIAGNOSIS — E1142 Type 2 diabetes mellitus with diabetic polyneuropathy: Secondary | ICD-10-CM | POA: Diagnosis not present

## 2023-08-04 DIAGNOSIS — D649 Anemia, unspecified: Secondary | ICD-10-CM | POA: Insufficient documentation

## 2023-08-04 DIAGNOSIS — C184 Malignant neoplasm of transverse colon: Secondary | ICD-10-CM

## 2023-08-04 DIAGNOSIS — Z5111 Encounter for antineoplastic chemotherapy: Secondary | ICD-10-CM | POA: Insufficient documentation

## 2023-08-04 DIAGNOSIS — D709 Neutropenia, unspecified: Secondary | ICD-10-CM | POA: Insufficient documentation

## 2023-08-04 DIAGNOSIS — Z923 Personal history of irradiation: Secondary | ICD-10-CM | POA: Insufficient documentation

## 2023-08-04 DIAGNOSIS — R339 Retention of urine, unspecified: Secondary | ICD-10-CM | POA: Insufficient documentation

## 2023-08-04 DIAGNOSIS — F419 Anxiety disorder, unspecified: Secondary | ICD-10-CM | POA: Insufficient documentation

## 2023-08-04 DIAGNOSIS — Z85118 Personal history of other malignant neoplasm of bronchus and lung: Secondary | ICD-10-CM | POA: Diagnosis not present

## 2023-08-04 DIAGNOSIS — C189 Malignant neoplasm of colon, unspecified: Secondary | ICD-10-CM | POA: Diagnosis not present

## 2023-08-04 DIAGNOSIS — F17290 Nicotine dependence, other tobacco product, uncomplicated: Secondary | ICD-10-CM | POA: Diagnosis not present

## 2023-08-04 LAB — CMP (CANCER CENTER ONLY)
ALT: 14 U/L (ref 0–44)
AST: 17 U/L (ref 15–41)
Albumin: 3.5 g/dL (ref 3.5–5.0)
Alkaline Phosphatase: 48 U/L (ref 38–126)
Anion gap: 9 (ref 5–15)
BUN: 34 mg/dL — ABNORMAL HIGH (ref 8–23)
CO2: 22 mmol/L (ref 22–32)
Calcium: 8.2 mg/dL — ABNORMAL LOW (ref 8.9–10.3)
Chloride: 104 mmol/L (ref 98–111)
Creatinine: 1.79 mg/dL — ABNORMAL HIGH (ref 0.44–1.00)
GFR, Estimated: 29 mL/min — ABNORMAL LOW (ref >60)
Glucose, Bld: 178 mg/dL — ABNORMAL HIGH (ref 70–99)
Potassium: 3.8 mmol/L (ref 3.5–5.1)
Sodium: 135 mmol/L (ref 135–145)
Total Bilirubin: 0.2 mg/dL — ABNORMAL LOW (ref 0.3–1.2)
Total Protein: 6.1 g/dL — ABNORMAL LOW (ref 6.5–8.1)

## 2023-08-04 LAB — CBC WITH DIFFERENTIAL (CANCER CENTER ONLY)
Abs Immature Granulocytes: 0.03 10*3/uL (ref 0.00–0.07)
Basophils Absolute: 0 10*3/uL (ref 0.0–0.1)
Basophils Relative: 1 %
Eosinophils Absolute: 0.1 10*3/uL (ref 0.0–0.5)
Eosinophils Relative: 2 %
HCT: 27.3 % — ABNORMAL LOW (ref 36.0–46.0)
Hemoglobin: 8.7 g/dL — ABNORMAL LOW (ref 12.0–15.0)
Immature Granulocytes: 0 %
Lymphocytes Relative: 11 %
Lymphs Abs: 0.7 10*3/uL (ref 0.7–4.0)
MCH: 30.3 pg (ref 26.0–34.0)
MCHC: 31.9 g/dL (ref 30.0–36.0)
MCV: 95.1 fL (ref 80.0–100.0)
Monocytes Absolute: 0.8 10*3/uL (ref 0.1–1.0)
Monocytes Relative: 12 %
Neutro Abs: 5 10*3/uL (ref 1.7–7.7)
Neutrophils Relative %: 74 %
Platelet Count: 191 10*3/uL (ref 150–400)
RBC: 2.87 MIL/uL — ABNORMAL LOW (ref 3.87–5.11)
RDW: 18.9 % — ABNORMAL HIGH (ref 11.5–15.5)
WBC Count: 6.7 10*3/uL (ref 4.0–10.5)
nRBC: 0 % (ref 0.0–0.2)

## 2023-08-04 LAB — PROTEIN, URINE, RANDOM: Total Protein, Urine: 19 mg/dL

## 2023-08-04 MED ORDER — SODIUM CHLORIDE 0.9 % IV SOLN
10.0000 mg | Freq: Once | INTRAVENOUS | Status: AC
  Administered 2023-08-04: 10 mg via INTRAVENOUS
  Filled 2023-08-04: qty 10

## 2023-08-04 MED ORDER — ATROPINE SULFATE 1 MG/ML IV SOLN
0.5000 mg | Freq: Once | INTRAVENOUS | Status: AC | PRN
  Administered 2023-08-04: 0.5 mg via INTRAVENOUS
  Filled 2023-08-04: qty 1

## 2023-08-04 MED ORDER — SODIUM CHLORIDE 0.9 % IV SOLN
180.0000 mg/m2 | Freq: Once | INTRAVENOUS | Status: AC
  Administered 2023-08-04: 300 mg via INTRAVENOUS
  Filled 2023-08-04: qty 15

## 2023-08-04 MED ORDER — FLUOROURACIL CHEMO INJECTION 2.5 GM/50ML
400.0000 mg/m2 | Freq: Once | INTRAVENOUS | Status: AC
  Administered 2023-08-04: 650 mg via INTRAVENOUS
  Filled 2023-08-04: qty 13

## 2023-08-04 MED ORDER — SODIUM CHLORIDE 0.9 % IV SOLN
5.0000 mg/kg | Freq: Once | INTRAVENOUS | Status: AC
  Administered 2023-08-04: 300 mg via INTRAVENOUS
  Filled 2023-08-04: qty 12

## 2023-08-04 MED ORDER — PALONOSETRON HCL INJECTION 0.25 MG/5ML
0.2500 mg | Freq: Once | INTRAVENOUS | Status: AC
  Administered 2023-08-04: 0.25 mg via INTRAVENOUS
  Filled 2023-08-04: qty 5

## 2023-08-04 MED ORDER — SODIUM CHLORIDE 0.9 % IV SOLN
Freq: Once | INTRAVENOUS | Status: AC
  Filled 2023-08-04: qty 250

## 2023-08-04 MED ORDER — SODIUM CHLORIDE 0.9 % IV SOLN
2400.0000 mg/m2 | INTRAVENOUS | Status: DC
  Administered 2023-08-04: 3900 mg via INTRAVENOUS
  Filled 2023-08-04: qty 78

## 2023-08-04 MED ORDER — SODIUM CHLORIDE 0.9 % IV SOLN
400.0000 mg/m2 | Freq: Once | INTRAVENOUS | Status: AC
  Administered 2023-08-04: 652 mg via INTRAVENOUS
  Filled 2023-08-04: qty 32.6

## 2023-08-04 NOTE — Patient Instructions (Signed)
[place] [facility]  Discharge Instructions: Thank you for choosing [facility] to provide your oncology and hematology care.  If you have a lab appointment with the Cancer Center, please go directly to the Cancer Center and check in at the registration area.  Wear comfortable clothing and clothing appropriate for easy access to any Portacath or PICC line.   We strive to give you quality time with your provider. You may need to reschedule your appointment if you arrive late (15 or more minutes).  Arriving late affects you and other patients whose appointments are after yours.  Also, if you miss three or more appointments without notifying the office, you may be dismissed from the clinic at the provider's discretion.      For prescription refill requests, have your pharmacy contact our office and allow 72 hours for refills to be completed.    Today you received the following chemotherapy and/or immunotherapy agents irinotecan, leucovorin, avastin, adrucil    To help prevent nausea and vomiting after your treatment, we encourage you to take your nausea medication as directed.  BELOW ARE SYMPTOMS THAT SHOULD BE REPORTED IMMEDIATELY: *FEVER GREATER THAN 100.4 F (38 C) OR HIGHER *CHILLS OR SWEATING *NAUSEA AND VOMITING THAT IS NOT CONTROLLED WITH YOUR NAUSEA MEDICATION *UNUSUAL SHORTNESS OF BREATH *UNUSUAL BRUISING OR BLEEDING *URINARY PROBLEMS (pain or burning when urinating, or frequent urination) *BOWEL PROBLEMS (unusual diarrhea, constipation, pain near the anus) TENDERNESS IN MOUTH AND THROAT WITH OR WITHOUT PRESENCE OF ULCERS (sore throat, sores in mouth, or a toothache) UNUSUAL RASH, SWELLING OR PAIN  UNUSUAL VAGINAL DISCHARGE OR ITCHING   Items with * indicate a potential emergency and should be followed up as soon as possible or go to the Emergency Department if any problems should occur.  Please show the CHEMOTHERAPY ALERT CARD or  IMMUNOTHERAPY ALERT CARD at check-in to the Emergency Department and triage nurse.  Should you have questions after your visit or need to cancel or reschedule your appointment, please contact [place] [facility]  [phone] and follow the prompts.  Office hours are 8:00 a.m. to 4:30 p.m. Monday - Friday. Please note that voicemails left after 4:00 p.m. may not be returned until the following business day.  We are closed weekends and major holidays. You have access to a nurse at all times for urgent questions. Please call the main number to the clinic [phone] and follow the prompts.  For any non-urgent questions, you may also contact your provider using MyChart. We now offer e-Visits for anyone 50 and older to request care online for non-urgent symptoms. For details visit [email or web address].   Also download the MyChart app! Go to the app store, search "MyChart", open the app, select [place], and log in with your MyChart username and password.

## 2023-08-04 NOTE — Progress Notes (Signed)
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  INTERVAL HISTORY: Patient returns to clinic today for further evaluation and consideration of cycle 5 of FOLFIRI plus Avastin.  She continues to have chronic weakness and fatigue, but otherwise feels well.  She has a chronic peripheral neuropathy, but no other neurologic complaints.  She denies any recent fevers or illnesses.  She has no chest pain, shortness of breath, cough, or hemoptysis.  She has no abdominal pain.  She denies any nausea, vomiting, constipation, or diarrhea.  She has no melena or hematochezia.  She has no urinary complaints.  Patient offers no further specific complaints today.  REVIEW OF SYSTEMS:   Review of Systems  Constitutional:  Positive for malaise/fatigue. Negative for fever and weight loss.  Respiratory: Negative.  Negative for cough, hemoptysis and shortness of breath.   Cardiovascular: Negative.  Negative for chest pain and leg swelling.  Gastrointestinal: Negative.  Negative for abdominal pain, blood in stool, constipation, diarrhea, melena, nausea and vomiting.  Genitourinary: Negative.  Negative for dysuria.  Musculoskeletal: Negative.  Negative for back pain and falls.  Skin: Negative.  Negative for rash.  Neurological:  Positive for sensory change and weakness. Negative for dizziness, focal weakness and headaches.  Psychiatric/Behavioral: Negative.  The patient is not nervous/anxious.     As per HPI. Otherwise, a complete review of systems is negative.  PAST MEDICAL HISTORY: Past Medical  History:  Diagnosis Date   Anemia    Anxiety    Asthma    Cancer (HCC)    colon   CHF (congestive heart failure) (HCC)    COPD (chronic obstructive pulmonary disease) (HCC)    Diabetes mellitus without complication (HCC)    Hyperlipidemia    Hypertension    Stroke [facility])     PAST SURGICAL HISTORY: Past Surgical History:  Procedure Laterality Date   BIOPSY  06/26/2023   Procedure: BIOPSY;  Surgeon: Regis Bill, MD;  Location: [facility] ENDOSCOPY;  Service: Endoscopy;;   COLONOSCOPY N/A 06/26/2023   Procedure: COLONOSCOPY;  Surgeon: Regis Bill, MD;  Location: [facility] ENDOSCOPY;  Service: Endoscopy;  Laterality: N/A;   COLONOSCOPY WITH PROPOFOL N/A 02/03/2022   Procedure: COLONOSCOPY WITH PROPOFOL;  Surgeon: Toney Reil, MD;  Location: [facility] ENDOSCOPY;  Service: Gastroenterology;  Laterality: N/A;   ESOPHAGOGASTRODUODENOSCOPY N/A 06/26/2023   Procedure: ESOPHAGOGASTRODUODENOSCOPY (EGD);  Surgeon: Regis Bill, MD;  Location: [facility] ENDOSCOPY;  Service: Endoscopy;  Laterality: N/A;   ESOPHAGOGASTRODUODENOSCOPY (EGD) WITH PROPOFOL N/A 02/03/2022   Procedure: ESOPHAGOGASTRODUODENOSCOPY (EGD) WITH PROPOFOL;  Surgeon: Toney Reil, MD;  Location: [facility] ENDOSCOPY;  Service: Gastroenterology;  Laterality: N/A;   GIVENS CAPSULE STUDY N/A 02/03/2022   Procedure: GIVENS CAPSULE STUDY;  Surgeon: Toney Reil, MD;  Location: [facility] ENDOSCOPY;  Service: Gastroenterology;  Laterality: N/A;  possible Capsule Study   IR IMAGING GUIDED PORT INSERTION  06/18/2022   POLYPECTOMY  06/26/2023   Procedure: POLYPECTOMY;  Surgeon: Regis Bill, MD;  Location: [facility] ENDOSCOPY;  Service: Endoscopy;;    FAMILY HISTORY: Family History  Problem Relation Age of Onset   Diabetes Mother    Heart  attack Father    Colon cancer Father     ADVANCED DIRECTIVES (Y/N):  N  HEALTH MAINTENANCE: Social History   Tobacco Use   Smoking status: Former    Types: E-cigarettes    Quit date:  12/08/2022    Years since quitting: 0.6   Tobacco comments:    Quit Cigarettes 12/2021  Vaping Use   Vaping status: Every Day   Substances: Nicotine, THC  Substance Use Topics   Alcohol use: Yes    Comment: rare   Drug use: Yes    Types: Marijuana     Colonoscopy:  PAP:  Bone density:  Lipid panel:  Allergies  Allergen Reactions   Codeine Rash    Happened in 1970s    Current Outpatient Medications  Medication Sig Dispense Refill   acetaminophen (TYLENOL) 325 MG tablet Take 650 mg by mouth every 6 (six) hours as needed.     albuterol (VENTOLIN HFA) 108 (90 Base) MCG/ACT inhaler Inhale into the lungs.     atorvastatin (LIPITOR) 40 MG tablet Take 40 mg by mouth daily.     clonazePAM (KLONOPIN) 1 MG tablet Take 1 mg by mouth 2 (two) times daily.     fenofibrate 54 MG tablet Take 54 mg by mouth daily.     gabapentin (NEURONTIN) 600 MG tablet Take by mouth 2 (two) times daily.     insulin lispro (HUMALOG) 100 UNIT/ML injection Inject 1-10 Units into the skin 3 (three) times daily before meals. Per sliding scale     LANTUS SOLOSTAR 100 UNIT/ML Solostar Pen Inject 12 Units into the skin at bedtime.     lisinopril (ZESTRIL) 20 MG tablet Take 20 mg by mouth daily.     Magnesium Citrate 200 MG TABS Take 1 tablet by mouth in the morning and at bedtime. 250 twice daily     metoprolol succinate (TOPROL-XL) 50 MG 24 hr tablet Take 50 mg by mouth daily. Take with or immediately following a meal.     OLANZapine (ZYPREXA) 10 MG tablet Take 0.5-1 tablets (5-10 mg total) by mouth at bedtime as needed (nausea). 30 tablet 1   ranolazine (RANEXA) 500 MG 12 hr tablet Take 500 mg by mouth 2 (two) times daily.     sitaGLIPtin-metformin (JANUMET) 50-1000 MG tablet Take 1 tablet by mouth daily.     venlafaxine XR (EFFEXOR-XR) 75 MG 24 hr capsule Take 75 mg by mouth daily.     pantoprazole (PROTONIX) 40 MG tablet Take 1 tablet (40 mg total) by mouth daily. 30 tablet 5   No current facility-administered  medications for this visit.   Facility-Administered Medications Ordered in Other Visits  Medication Dose Route Frequency Provider Last Rate Last Admin   fluorouracil (ADRUCIL) 3,900 mg in sodium chloride 0.9 % 72 mL chemo infusion  2,400 mg/m2 (Treatment Plan Recorded) Intravenous 1 day or 1 dose [name], Tollie Pizza, MD       fluorouracil (ADRUCIL) chemo injection 650 mg  400 mg/m2 (Treatment Plan Recorded) Intravenous Once Jeralyn Ruths, MD       irinotecan (CAMPTOSAR) 300 mg in sodium chloride 0.9 % 500 mL chemo infusion  180 mg/m2 (Treatment Plan Recorded) Intravenous Once Jeralyn Ruths, MD 343 mL/hr at 08/04/23 1132 300 mg at 08/04/23 1132   leucovorin 652 mg in sodium chloride 0.9 % 250 mL infusion  400 mg/m2 (Treatment Plan Recorded) Intravenous Once Jeralyn Ruths, MD 188 mL/hr at 08/04/23 1126 652 mg at 08/04/23 1126    OBJECTIVE:  Vitals:   08/04/23 0847  BP: (!) 93/47  Pulse: 89  Resp: 16  Temp: 98.1 F (36.7 C)  SpO2: 97%        Body mass index is 22.49 kg/m.    ECOG FS:1 - Symptomatic but completely ambulatory  General: Well-developed, well-nourished, no acute distress.  Sitting in wheelchair. Eyes: Pink conjunctiva, anicteric sclera. HEENT: Normocephalic, moist mucous membranes. Lungs: No audible wheezing or coughing. Heart: Regular rate and rhythm. Abdomen: Soft, nontender, no obvious distention. Musculoskeletal: No edema, cyanosis, or clubbing. Neuro: Alert, answering all questions appropriately. Cranial nerves grossly intact. Skin: No rashes or petechiae noted. Psych: Normal affect.  LAB RESULTS:  Lab Results  Component Value Date   NA 135 08/04/2023   K 3.8 08/04/2023   CL 104 08/04/2023   CO2 22 08/04/2023   GLUCOSE 178 (H) 08/04/2023   BUN 34 (H) 08/04/2023   CREATININE 1.79 (H) 08/04/2023   CALCIUM 8.2 (L) 08/04/2023   PROT 6.1 (L) 08/04/2023   ALBUMIN 3.5 08/04/2023   AST 17 08/04/2023   ALT 14 08/04/2023   ALKPHOS 48 08/04/2023    BILITOT 0.2 (L) 08/04/2023   GFRNONAA 29 (L) 08/04/2023    Lab Results  Component Value Date   WBC 6.7 08/04/2023   NEUTROABS 5.0 08/04/2023   HGB 8.7 (L) 08/04/2023   HCT 27.3 (L) 08/04/2023   MCV 95.1 08/04/2023   PLT 191 08/04/2023     STUDIES: No results found.  ASSESSMENT: Stage IIIc colon cancer.  PLAN:    Stage IIIc colon cancer: Patient underwent complete surgical resection at [facility] on May 23, 2022.  Large colon mass was noted to partially invade stomach as well as surrounding mesentery.  PET scan did not reveal any metastatic disease other than a positive lung nodule which was confirmed to be a second primary.  Patient completed her final cycle of adjuvant FOLFOX on December 23, 2022.  Patient CEA trended up to 181, but now has decreased to 122.  Her most recent result was 131.  PET scan results from May 20, 2023 reviewed independently with clear progression of disease.  Patient was seen at [facility] and determined not to be a surgical candidate.  Proceed with cycle 5 of treatment today using FOLFIRI plus Avastin.  Patient did not receive Avastin through her first 4 cycles.  Return to clinic in 2 days for pump removal and then in 2 weeks for further evaluation and consideration of cycle 6.  Will reimage at the conclusion of cycle 6.   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  Patient completed XRT in May 2023.  PET scan as above with no obvious evidence of recurrence.   Pain: Resolved. Anemia: Hemoglobin has trended down to 8.7, monitor. Leukopenia: Resolved. Neuropathy/falls: Patient does not complain of this today.  Patient was previously given a referral to Occupational Therapy. Coping/anxiety: Patient was previously given a referral to [facility].   Patient expressed understanding and was in agreement with this plan. She also understands that She can call clinic at any time with any  questions, concerns, or complaints.    Cancer Staging  Colon cancer [facility]) Staging form: Colon and Rectum, AJCC 8th Edition - Pathologic stage from 06/10/2022: Stage IIIC (pT4b, pN1c, cM0) - Signed by Jeralyn Ruths, MD on 06/10/2022 Stage prefix: Initial diagnosis Total positive nodes: 0 Histologic grading system: 4 grade system Histologic grade (G):  G3   Jeralyn Ruths, MD   08/04/2023 11:33 AM

## 2023-08-06 ENCOUNTER — Inpatient Hospital Stay: Payer: Medicare Other

## 2023-08-06 VITALS — BP 131/62 | HR 86 | Temp 98.2°F | Resp 18

## 2023-08-06 DIAGNOSIS — Z5111 Encounter for antineoplastic chemotherapy: Secondary | ICD-10-CM | POA: Diagnosis not present

## 2023-08-06 DIAGNOSIS — C184 Malignant neoplasm of transverse colon: Secondary | ICD-10-CM

## 2023-08-06 MED ORDER — HEPARIN SOD (PORK) LOCK FLUSH 100 UNIT/ML IV SOLN
500.0000 [IU] | Freq: Once | INTRAVENOUS | Status: AC | PRN
  Administered 2023-08-06: 500 [IU]
  Filled 2023-08-06: qty 5

## 2023-08-06 MED ORDER — SODIUM CHLORIDE 0.9% FLUSH
10.0000 mL | INTRAVENOUS | Status: DC | PRN
  Administered 2023-08-06: 10 mL
  Filled 2023-08-06: qty 10

## 2023-08-17 ENCOUNTER — Other Ambulatory Visit: Payer: Self-pay | Admitting: *Deleted

## 2023-08-17 DIAGNOSIS — C184 Malignant neoplasm of transverse colon: Secondary | ICD-10-CM

## 2023-08-17 MED FILL — Dexamethasone Sodium Phosphate Inj 100 MG/10ML: INTRAMUSCULAR | Qty: 1 | Status: AC

## 2023-08-18 ENCOUNTER — Inpatient Hospital Stay: Payer: Medicare Other

## 2023-08-18 ENCOUNTER — Encounter: Payer: Self-pay | Admitting: Oncology

## 2023-08-18 ENCOUNTER — Inpatient Hospital Stay (HOSPITAL_BASED_OUTPATIENT_CLINIC_OR_DEPARTMENT_OTHER): Payer: Medicare Other | Admitting: Oncology

## 2023-08-18 VITALS — BP 121/55 | HR 84 | Temp 97.2°F | Resp 16 | Ht 64.0 in | Wt 135.6 lb

## 2023-08-18 DIAGNOSIS — C184 Malignant neoplasm of transverse colon: Secondary | ICD-10-CM | POA: Diagnosis not present

## 2023-08-18 DIAGNOSIS — D649 Anemia, unspecified: Secondary | ICD-10-CM

## 2023-08-18 DIAGNOSIS — Z5111 Encounter for antineoplastic chemotherapy: Secondary | ICD-10-CM | POA: Diagnosis not present

## 2023-08-18 LAB — CBC WITH DIFFERENTIAL (CANCER CENTER ONLY)
Abs Immature Granulocytes: 0.06 10*3/uL (ref 0.00–0.07)
Basophils Absolute: 0 10*3/uL (ref 0.0–0.1)
Basophils Relative: 1 %
Eosinophils Absolute: 0.1 10*3/uL (ref 0.0–0.5)
Eosinophils Relative: 3 %
HCT: 23.8 % — ABNORMAL LOW (ref 36.0–46.0)
Hemoglobin: 7.6 g/dL — ABNORMAL LOW (ref 12.0–15.0)
Immature Granulocytes: 3 %
Lymphocytes Relative: 31 %
Lymphs Abs: 0.5 10*3/uL — ABNORMAL LOW (ref 0.7–4.0)
MCH: 28.9 pg (ref 26.0–34.0)
MCHC: 31.9 g/dL (ref 30.0–36.0)
MCV: 90.5 fL (ref 80.0–100.0)
Monocytes Absolute: 0.3 10*3/uL (ref 0.1–1.0)
Monocytes Relative: 20 %
Neutro Abs: 0.7 10*3/uL — ABNORMAL LOW (ref 1.7–7.7)
Neutrophils Relative %: 42 %
Platelet Count: 182 10*3/uL (ref 150–400)
RBC: 2.63 MIL/uL — ABNORMAL LOW (ref 3.87–5.11)
RDW: 17.4 % — ABNORMAL HIGH (ref 11.5–15.5)
WBC Count: 1.7 10*3/uL — ABNORMAL LOW (ref 4.0–10.5)
nRBC: 1.1 % — ABNORMAL HIGH (ref 0.0–0.2)

## 2023-08-18 LAB — CMP (CANCER CENTER ONLY)
ALT: 11 U/L (ref 0–44)
AST: 14 U/L — ABNORMAL LOW (ref 15–41)
Albumin: 2.9 g/dL — ABNORMAL LOW (ref 3.5–5.0)
Alkaline Phosphatase: 45 U/L (ref 38–126)
Anion gap: 6 (ref 5–15)
BUN: 14 mg/dL (ref 8–23)
CO2: 25 mmol/L (ref 22–32)
Calcium: 8.5 mg/dL — ABNORMAL LOW (ref 8.9–10.3)
Chloride: 106 mmol/L (ref 98–111)
Creatinine: 0.7 mg/dL (ref 0.44–1.00)
GFR, Estimated: >60 mL/min (ref >60)
Glucose, Bld: 126 mg/dL — ABNORMAL HIGH (ref 70–99)
Potassium: 3.3 mmol/L — ABNORMAL LOW (ref 3.5–5.1)
Sodium: 137 mmol/L (ref 135–145)
Total Bilirubin: 0.6 mg/dL (ref 0.3–1.2)
Total Protein: 6 g/dL — ABNORMAL LOW (ref 6.5–8.1)

## 2023-08-18 LAB — PREPARE RBC (CROSSMATCH)

## 2023-08-18 LAB — PROTEIN, URINE, RANDOM: Total Protein, Urine: 27 mg/dL

## 2023-08-18 MED ORDER — SODIUM CHLORIDE 0.9% IV SOLUTION
250.0000 mL | Freq: Once | INTRAVENOUS | Status: AC
  Administered 2023-08-18: 250 mL via INTRAVENOUS
  Filled 2023-08-18: qty 250

## 2023-08-18 MED ORDER — DIPHENHYDRAMINE HCL 50 MG/ML IJ SOLN
25.0000 mg | Freq: Once | INTRAMUSCULAR | Status: AC
  Administered 2023-08-18: 25 mg via INTRAVENOUS
  Filled 2023-08-18: qty 1

## 2023-08-18 MED ORDER — ACETAMINOPHEN 325 MG PO TABS
650.0000 mg | ORAL_TABLET | Freq: Once | ORAL | Status: AC
  Administered 2023-08-18: 650 mg via ORAL
  Filled 2023-08-18: qty 2

## 2023-08-18 NOTE — Progress Notes (Signed)
States that she feels like she is not urinating enough when she urinates. Having bilateral leg and ankle swelling. Also has some SOB and some trouble with swallowing.

## 2023-08-18 NOTE — Progress Notes (Signed)
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  INTERVAL HISTORY: Patient returns to clinic today for further evaluation and consideration of cycle 6 of FOLFIRI plus Avastin.  She feels sensation of incomplete bladder emptying, but otherwise feels well.  She continues to have chronic weakness and fatigue. She has a chronic peripheral neuropathy, but no other neurologic complaints.  She denies any recent fevers or illnesses.  She has no chest pain, shortness of breath, cough, or hemoptysis.  She has no abdominal pain.  She denies any nausea, vomiting, constipation, or diarrhea.  She has no melena or hematochezia.  She has no urinary complaints.  Patient offers no further specific complaints today.  REVIEW OF SYSTEMS:   Review of Systems  Constitutional:  Positive for malaise/fatigue. Negative for fever and weight loss.  Respiratory: Negative.  Negative for cough, hemoptysis and shortness of breath.   Cardiovascular: Negative.  Negative for chest pain and leg swelling.  Gastrointestinal: Negative.  Negative for abdominal pain, blood in stool, constipation, diarrhea, melena, nausea and vomiting.  Genitourinary: Negative.  Negative for dysuria.  Musculoskeletal: Negative.  Negative for back pain and falls.  Skin: Negative.  Negative for rash.  Neurological:  Positive for sensory change and weakness. Negative for dizziness, focal weakness and headaches.  Psychiatric/Behavioral: Negative.  The patient is not nervous/anxious.     As per HPI. Otherwise, a complete review of systems  is negative.  PAST MEDICAL HISTORY: Past Medical History:  Diagnosis Date   Anemia    Anxiety    Asthma    Cancer (HCC)    colon   CHF (congestive heart failure) (HCC)    COPD (chronic obstructive pulmonary disease) (HCC)    Diabetes mellitus without complication (HCC)    Hyperlipidemia    Hypertension    Stroke [facility])     PAST SURGICAL HISTORY: Past Surgical History:  Procedure Laterality Date   BIOPSY  06/26/2023   Procedure: BIOPSY;  Surgeon: Regis Bill, MD;  Location: [facility] ENDOSCOPY;  Service: Endoscopy;;   COLONOSCOPY N/A 06/26/2023   Procedure: COLONOSCOPY;  Surgeon: Regis Bill, MD;  Location: [facility] ENDOSCOPY;  Service: Endoscopy;  Laterality: N/A;   COLONOSCOPY WITH PROPOFOL N/A 02/03/2022   Procedure: COLONOSCOPY WITH PROPOFOL;  Surgeon: Toney Reil, MD;  Location: [facility] ENDOSCOPY;  Service: Gastroenterology;  Laterality: N/A;   ESOPHAGOGASTRODUODENOSCOPY N/A 06/26/2023   Procedure: ESOPHAGOGASTRODUODENOSCOPY (EGD);  Surgeon: Regis Bill, MD;  Location: [facility] ENDOSCOPY;  Service: Endoscopy;  Laterality: N/A;   ESOPHAGOGASTRODUODENOSCOPY (EGD) WITH PROPOFOL N/A 02/03/2022   Procedure: ESOPHAGOGASTRODUODENOSCOPY (EGD) WITH PROPOFOL;  Surgeon: Toney Reil, MD;  Location: [facility] ENDOSCOPY;  Service: Gastroenterology;  Laterality: N/A;   GIVENS CAPSULE STUDY N/A 02/03/2022   Procedure: GIVENS CAPSULE STUDY;  Surgeon: Toney Reil, MD;  Location: [facility] ENDOSCOPY;  Service: Gastroenterology;  Laterality: N/A;  possible Capsule Study   IR IMAGING GUIDED PORT INSERTION  06/18/2022   POLYPECTOMY  06/26/2023   Procedure: POLYPECTOMY;  Surgeon: Regis Bill, MD;  Location: [facility] ENDOSCOPY;  Service: Endoscopy;;    FAMILY HISTORY: Family History  Problem Relation Age of Onset  Diabetes Mother    Heart attack Father    Colon cancer Father     ADVANCED DIRECTIVES (Y/N):  N  HEALTH MAINTENANCE: Social History   Tobacco Use   Smoking  status: Former    Types: E-cigarettes    Quit date: 12/08/2022    Years since quitting: 0.6   Tobacco comments:    Quit Cigarettes 12/2021  Vaping Use   Vaping status: Every Day   Substances: Nicotine, THC  Substance Use Topics   Alcohol use: Yes    Comment: rare   Drug use: Yes    Types: Marijuana     Colonoscopy:  PAP:  Bone density:  Lipid panel:  Allergies  Allergen Reactions   Codeine Rash    Happened in 1970s    Current Outpatient Medications  Medication Sig Dispense Refill   acetaminophen (TYLENOL) 325 MG tablet Take 650 mg by mouth every 6 (six) hours as needed.     albuterol (VENTOLIN HFA) 108 (90 Base) MCG/ACT inhaler Inhale into the lungs.     atorvastatin (LIPITOR) 40 MG tablet Take 40 mg by mouth daily.     clonazePAM (KLONOPIN) 1 MG tablet Take 1 mg by mouth 2 (two) times daily.     fenofibrate 54 MG tablet Take 54 mg by mouth daily.     gabapentin (NEURONTIN) 600 MG tablet Take by mouth 2 (two) times daily.     insulin lispro (HUMALOG) 100 UNIT/ML injection Inject 1-10 Units into the skin 3 (three) times daily before meals. Per sliding scale     LANTUS SOLOSTAR 100 UNIT/ML Solostar Pen Inject 12 Units into the skin at bedtime.     lisinopril (ZESTRIL) 20 MG tablet Take 20 mg by mouth daily.     Magnesium Citrate 200 MG TABS Take 1 tablet by mouth in the morning and at bedtime. 250 twice daily     metoprolol succinate (TOPROL-XL) 50 MG 24 hr tablet Take 50 mg by mouth daily. Take with or immediately following a meal.     OLANZapine (ZYPREXA) 10 MG tablet Take 0.5-1 tablets (5-10 mg total) by mouth at bedtime as needed (nausea). 30 tablet 1   ranolazine (RANEXA) 500 MG 12 hr tablet Take 500 mg by mouth 2 (two) times daily.     sitaGLIPtin-metformin (JANUMET) 50-1000 MG tablet Take 1 tablet by mouth daily.     venlafaxine XR (EFFEXOR-XR) 75 MG 24 hr capsule Take 75 mg by mouth daily.     pantoprazole (PROTONIX) 40 MG tablet Take 1 tablet (40 mg total) by mouth  daily. 30 tablet 5   No current facility-administered medications for this visit.    OBJECTIVE: Vitals:   08/18/23 0901  BP: (!) 121/55  Pulse: 84  Resp: 16  Temp: (!) 97.2 F (36.2 C)  SpO2: 100%        Body mass index is 23.28 kg/m.    ECOG FS:1 - Symptomatic but completely ambulatory  General: Well-developed, well-nourished, no acute distress.  Sitting in a wheelchair. Eyes: Pink conjunctiva, anicteric sclera. HEENT: Normocephalic, moist mucous membranes. Lungs: No audible wheezing or coughing. Heart: Regular rate and rhythm. Abdomen: Soft, nontender, no obvious distention. Musculoskeletal: No edema, cyanosis, or clubbing. Neuro: Alert, answering all questions appropriately. Cranial nerves grossly intact. Skin: No rashes or petechiae noted. Psych: Normal affect.  LAB RESULTS:  Lab Results  Component Value Date   NA 137 08/18/2023   K 3.3 (L) 08/18/2023   CL 106 08/18/2023   CO2 25 08/18/2023  GLUCOSE 126 (H) 08/18/2023   BUN 14 08/18/2023   CREATININE 0.70 08/18/2023   CALCIUM 8.5 (L) 08/18/2023   PROT 6.0 (L) 08/18/2023   ALBUMIN 2.9 (L) 08/18/2023   AST 14 (L) 08/18/2023   ALT 11 08/18/2023   ALKPHOS 45 08/18/2023   BILITOT 0.6 08/18/2023   GFRNONAA >60 08/18/2023    Lab Results  Component Value Date   WBC 1.7 (L) 08/18/2023   NEUTROABS 0.7 (L) 08/18/2023   HGB 7.6 (L) 08/18/2023   HCT 23.8 (L) 08/18/2023   MCV 90.5 08/18/2023   PLT 182 08/18/2023     STUDIES: No results found.  ASSESSMENT: Stage IIIc colon cancer.  PLAN:    Stage IIIc colon cancer: Patient underwent complete surgical resection at [facility] on May 23, 2022.  Large colon mass was noted to partially invade stomach as well as surrounding mesentery.  PET scan did not reveal any metastatic disease other than a positive lung nodule which was confirmed to be a second primary.  Patient completed her final cycle of adjuvant FOLFOX on December 23, 2022.  Patient CEA trended up  to 181, but now has decreased to 122.  Her most recent result was 131.  PET scan results from May 20, 2023 reviewed independently with clear progression of disease.  Patient was seen at [facility] and determined not to be a surgical candidate.  Delay cycle 6 of treatment today secondary to neutropenia.  Will schedule repeat CT scan this week prior to patient's return for reconsideration of cycle 6.   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  Patient completed XRT in May 2023.  PET scan as above with no obvious evidence of recurrence.  Repeat imaging as above. Pain: Resolved. Anemia: Hemoglobin is trended down to 7.6.  Patient will receive 1 unit of packed red blood cells today instead of treatment.   Neutropenia: Delay treatment as above.  Patient may require Udenyca in the near future. Neuropathy/falls: Patient does not complain of this today.  Patient was previously given a referral to Occupational Therapy. Coping/anxiety: Patient was previously given a referral to [facility]. Incomplete bladder emptying: Imaging as above.   Patient expressed understanding and was in agreement with this plan. She also understands that She can call clinic at any time with any questions, concerns, or complaints.    Cancer Staging  Colon cancer [facility]) Staging form: Colon and Rectum, AJCC 8th Edition - Pathologic stage from 06/10/2022: Stage IIIC (pT4b, pN1c, cM0) - Signed by Jeralyn Ruths, MD on 06/10/2022 Stage prefix: Initial diagnosis Total positive nodes: 0 Histologic grading system: 4 grade system Histologic grade (G): G3   Jeralyn Ruths, MD   08/18/2023 9:39 AM

## 2023-08-19 LAB — TYPE AND SCREEN
ABO/RH(D): A POS
Antibody Screen: NEGATIVE
Unit division: 0

## 2023-08-19 LAB — BPAM RBC
Blood Product Expiration Date: 202410172359
ISSUE DATE / TIME: 202409171121
Unit Type and Rh: 6200

## 2023-08-20 ENCOUNTER — Inpatient Hospital Stay: Payer: Medicare Other

## 2023-08-20 ENCOUNTER — Inpatient Hospital Stay (HOSPITAL_BASED_OUTPATIENT_CLINIC_OR_DEPARTMENT_OTHER): Payer: Medicare Other | Admitting: Hospice and Palliative Medicine

## 2023-08-20 DIAGNOSIS — C184 Malignant neoplasm of transverse colon: Secondary | ICD-10-CM

## 2023-08-20 NOTE — Progress Notes (Signed)
VM Left. Will reschedule

## 2023-08-24 ENCOUNTER — Ambulatory Visit: Payer: Medicare Other

## 2023-08-24 ENCOUNTER — Ambulatory Visit
Admission: RE | Admit: 2023-08-24 | Discharge: 2023-08-24 | Disposition: A | Discharge status: Home / Self Care | Payer: Medicare Other | Source: Ambulatory Visit | Attending: Oncology | Admitting: Oncology

## 2023-08-24 DIAGNOSIS — C184 Malignant neoplasm of transverse colon: Secondary | ICD-10-CM | POA: Insufficient documentation

## 2023-08-24 MED ORDER — IOHEXOL 300 MG/ML  SOLN
100.0000 mL | Freq: Once | INTRAMUSCULAR | Status: AC | PRN
  Administered 2023-08-24: 100 mL via INTRAVENOUS

## 2023-08-24 MED FILL — Dexamethasone Sodium Phosphate Inj 100 MG/10ML: INTRAMUSCULAR | Qty: 1 | Status: AC

## 2023-08-25 ENCOUNTER — Inpatient Hospital Stay: Payer: Medicare Other

## 2023-08-25 ENCOUNTER — Inpatient Hospital Stay (HOSPITAL_BASED_OUTPATIENT_CLINIC_OR_DEPARTMENT_OTHER): Payer: Medicare Other | Admitting: Oncology

## 2023-08-25 ENCOUNTER — Encounter: Payer: Self-pay | Admitting: Oncology

## 2023-08-25 DIAGNOSIS — C184 Malignant neoplasm of transverse colon: Secondary | ICD-10-CM | POA: Diagnosis not present

## 2023-08-25 DIAGNOSIS — Z5111 Encounter for antineoplastic chemotherapy: Secondary | ICD-10-CM | POA: Diagnosis not present

## 2023-08-25 LAB — CMP (CANCER CENTER ONLY)
ALT: 11 U/L (ref 0–44)
AST: 15 U/L (ref 15–41)
Albumin: 3.2 g/dL — ABNORMAL LOW (ref 3.5–5.0)
Alkaline Phosphatase: 48 U/L (ref 38–126)
Anion gap: 7 (ref 5–15)
BUN: 8 mg/dL (ref 8–23)
CO2: 29 mmol/L (ref 22–32)
Calcium: 8.6 mg/dL — ABNORMAL LOW (ref 8.9–10.3)
Chloride: 104 mmol/L (ref 98–111)
Creatinine: 0.69 mg/dL (ref 0.44–1.00)
GFR, Estimated: >60 mL/min (ref >60)
Glucose, Bld: 127 mg/dL — ABNORMAL HIGH (ref 70–99)
Potassium: 3.5 mmol/L (ref 3.5–5.1)
Sodium: 140 mmol/L (ref 135–145)
Total Bilirubin: 0.6 mg/dL (ref 0.3–1.2)
Total Protein: 6.1 g/dL — ABNORMAL LOW (ref 6.5–8.1)

## 2023-08-25 LAB — CBC WITH DIFFERENTIAL (CANCER CENTER ONLY)
Abs Immature Granulocytes: 0.1 10*3/uL — ABNORMAL HIGH (ref 0.00–0.07)
Basophils Absolute: 0 10*3/uL (ref 0.0–0.1)
Basophils Relative: 1 %
Eosinophils Absolute: 0.1 10*3/uL (ref 0.0–0.5)
Eosinophils Relative: 2 %
HCT: 28.8 % — ABNORMAL LOW (ref 36.0–46.0)
Hemoglobin: 8.9 g/dL — ABNORMAL LOW (ref 12.0–15.0)
Immature Granulocytes: 2 %
Lymphocytes Relative: 11 %
Lymphs Abs: 0.7 10*3/uL (ref 0.7–4.0)
MCH: 28.5 pg (ref 26.0–34.0)
MCHC: 30.9 g/dL (ref 30.0–36.0)
MCV: 92.3 fL (ref 80.0–100.0)
Monocytes Absolute: 0.6 10*3/uL (ref 0.1–1.0)
Monocytes Relative: 9 %
Neutro Abs: 4.9 10*3/uL (ref 1.7–7.7)
Neutrophils Relative %: 75 %
Platelet Count: 203 10*3/uL (ref 150–400)
RBC: 3.12 MIL/uL — ABNORMAL LOW (ref 3.87–5.11)
RDW: 17.4 % — ABNORMAL HIGH (ref 11.5–15.5)
WBC Count: 6.4 10*3/uL (ref 4.0–10.5)
nRBC: 0 % (ref 0.0–0.2)

## 2023-08-25 MED ORDER — PALONOSETRON HCL INJECTION 0.25 MG/5ML
0.2500 mg | Freq: Once | INTRAVENOUS | Status: AC
  Administered 2023-08-25: 0.25 mg via INTRAVENOUS
  Filled 2023-08-25: qty 5

## 2023-08-25 MED ORDER — SODIUM CHLORIDE 0.9 % IV SOLN
2400.0000 mg/m2 | INTRAVENOUS | Status: DC
  Administered 2023-08-25: 3900 mg via INTRAVENOUS
  Filled 2023-08-25: qty 78

## 2023-08-25 MED ORDER — SODIUM CHLORIDE 0.9 % IV SOLN
10.0000 mg | Freq: Once | INTRAVENOUS | Status: AC
  Administered 2023-08-25: 10 mg via INTRAVENOUS
  Filled 2023-08-25: qty 1
  Filled 2023-08-25: qty 10

## 2023-08-25 MED ORDER — SODIUM CHLORIDE 0.9 % IV SOLN
400.0000 mg/m2 | Freq: Once | INTRAVENOUS | Status: AC
  Administered 2023-08-25: 652 mg via INTRAVENOUS
  Filled 2023-08-25: qty 32.6

## 2023-08-25 MED ORDER — SODIUM CHLORIDE 0.9 % IV SOLN
5.0000 mg/kg | Freq: Once | INTRAVENOUS | Status: AC
  Administered 2023-08-25: 300 mg via INTRAVENOUS
  Filled 2023-08-25: qty 12

## 2023-08-25 MED ORDER — FLUOROURACIL CHEMO INJECTION 2.5 GM/50ML
400.0000 mg/m2 | Freq: Once | INTRAVENOUS | Status: AC
  Administered 2023-08-25: 650 mg via INTRAVENOUS
  Filled 2023-08-25: qty 13

## 2023-08-25 MED ORDER — ATROPINE SULFATE 1 MG/ML IV SOLN
0.5000 mg | Freq: Once | INTRAVENOUS | Status: AC | PRN
  Administered 2023-08-25: 0.5 mg via INTRAVENOUS

## 2023-08-25 MED ORDER — SODIUM CHLORIDE 0.9 % IV SOLN
180.0000 mg/m2 | Freq: Once | INTRAVENOUS | Status: AC
  Administered 2023-08-25: 300 mg via INTRAVENOUS
  Filled 2023-08-25: qty 15

## 2023-08-25 MED ORDER — SODIUM CHLORIDE 0.9 % IV SOLN
Freq: Once | INTRAVENOUS | Status: AC
  Filled 2023-08-25: qty 250

## 2023-08-25 NOTE — Patient Instructions (Signed)
[facility]  Discharge Instructions: Thank you for choosing [facility] to provide your oncology and hematology care.  If you have a lab appointment with the Cancer Center, please go directly to the Cancer Center and check in at the registration area.  Wear comfortable clothing and clothing appropriate for easy access to any Portacath or PICC line.   We strive to give you quality time with your provider. You may need to reschedule your appointment if you arrive late (15 or more minutes).  Arriving late affects you and other patients whose appointments are after yours.  Also, if you miss three or more appointments without notifying the office, you may be dismissed from the clinic at the provider's discretion.      For prescription refill requests, have your pharmacy contact our office and allow 72 hours for refills to be completed.    Today you received the following chemotherapy and/or immunotherapy agents VEGZELMA, IRINOTECAN, LEUCOVORIN, 5 FU      To help prevent nausea and vomiting after your treatment, we encourage you to take your nausea medication as directed.  BELOW ARE SYMPTOMS THAT SHOULD BE REPORTED IMMEDIATELY: *FEVER GREATER THAN 100.4 F (38 C) OR HIGHER *CHILLS OR SWEATING *NAUSEA AND VOMITING THAT IS NOT CONTROLLED WITH YOUR NAUSEA MEDICATION *UNUSUAL SHORTNESS OF BREATH *UNUSUAL BRUISING OR BLEEDING *URINARY PROBLEMS (pain or burning when urinating, or frequent urination) *BOWEL PROBLEMS (unusual diarrhea, constipation, pain near the anus) TENDERNESS IN MOUTH AND THROAT WITH OR WITHOUT PRESENCE OF ULCERS (sore throat, sores in mouth, or a toothache) UNUSUAL RASH, SWELLING OR PAIN  UNUSUAL VAGINAL DISCHARGE OR ITCHING   Items with * indicate a potential emergency and should be followed up as soon as possible or go to the Emergency Department if any problems should occur.  Please show the CHEMOTHERAPY ALERT CARD or  IMMUNOTHERAPY ALERT CARD at check-in to the Emergency Department and triage nurse.  Should you have questions after your visit or need to cancel or reschedule your appointment, please contact [facility]  [phone] and follow the prompts.  Office hours are 8:00 a.m. to 4:30 p.m. Monday - Friday. Please note that voicemails left after 4:00 p.m. may not be returned until the following business day.  We are closed weekends and major holidays. You have access to a nurse at all times for urgent questions. Please call the main number to the clinic [phone] and follow the prompts.  For any non-urgent questions, you may also contact your provider using MyChart. We now offer e-Visits for anyone 36 and older to request care online for non-urgent symptoms. For details visit [email or web address].   Also download the MyChart app! Go to the app store, search "MyChart", open the app, select , and log in with your MyChart username and password.   Leucovorin Injection What is this medication? LEUCOVORIN (loo koe VOR in) prevents side effects from certain medications, such as methotrexate. It works by increasing folate levels. This helps protect healthy cells in your body. It may also be used to treat anemia caused by low levels of folate. It can also be used with fluorouracil, a type of chemotherapy, to treat colorectal cancer. It works by increasing the effects of fluorouracil in the body. This medicine may be used for other purposes; ask your health care provider or pharmacist if you have questions. What should I tell my care team before I take this medication? They need to know  if you have any of these conditions: Anemia from low levels of vitamin B12 in the blood An unusual or allergic reaction to leucovorin, folic acid, other medications, foods, dyes, or preservatives Pregnant or trying to get pregnant Breastfeeding How should I use this medication? This  medication is injected into a vein or a muscle. It is given by your care team in a hospital or clinic setting. Talk to your care team about the use of this medication in children. Special care may be needed. Overdosage: If you think you have taken too much of this medicine contact a poison control center or emergency room at once. NOTE: This medicine is only for you. Do not share this medicine with others. What if I miss a dose? Keep appointments for follow-up doses. It is important not to miss your dose. Call your care team if you are unable to keep an appointment. What may interact with this medication? Capecitabine Fluorouracil Phenobarbital Phenytoin Primidone Trimethoprim;sulfamethoxazole This list may not describe all possible interactions. Give your health care provider a list of all the medicines, herbs, non-prescription drugs, or dietary supplements you use. Also tell them if you smoke, drink alcohol, or use illegal drugs. Some items may interact with your medicine. What should I watch for while using this medication? Your condition will be monitored carefully while you are receiving this medication. This medication may increase the side effects of 5-fluorouracil. Tell your care team if you have diarrhea or mouth sores that do not get better or that get worse. What side effects may I notice from receiving this medication? Side effects that you should report to your care team as soon as possible: Allergic reactions--skin rash, itching, hives, swelling of the face, lips, tongue, or throat This list may not describe all possible side effects. Call your doctor for medical advice about side effects. You may report side effects to FDA at 1-800-FDA-1088. Where should I keep my medication? This medication is given in a hospital or clinic. It will not be stored at home. NOTE: This sheet is a summary. It may not cover all possible information. If you have questions about this medicine, talk to  your doctor, pharmacist, or health care provider.  2024 Elsevier/Gold Standard (2022-04-22 00:00:00)  Irinotecan Injection What is this medication? IRINOTECAN (ir in oh TEE kan) treats some types of cancer. It works by slowing down the growth of cancer cells. This medicine may be used for other purposes; ask your health care provider or pharmacist if you have questions. COMMON BRAND NAME(S): Camptosar What should I tell my care team before I take this medication? They need to know if you have any of these conditions: Dehydration Diarrhea Infection, especially a viral infection, such as chickenpox, cold sores, herpes Liver disease Low blood cell levels (white cells, red cells, and platelets) Low levels of electrolytes, such as calcium, magnesium, or potassium in your blood Recent or ongoing radiation An unusual or allergic reaction to irinotecan, other medications, foods, dyes, or preservatives If you or your partner are pregnant or trying to get pregnant Breast-feeding How should I use this medication? This medication is injected into a vein. It is given by your care team in a hospital or clinic setting. Talk to your care team about the use of this medication in children. Special care may be needed. Overdosage: If you think you have taken too much of this medicine contact a poison control center or emergency room at once. NOTE: This medicine is only for you. Do  not share this medicine with others. What if I miss a dose? Keep appointments for follow-up doses. It is important not to miss your dose. Call your care team if you are unable to keep an appointment. What may interact with this medication? Do not take this medication with any of the following: Cobicistat Itraconazole This medication may also interact with the following: Certain antibiotics, such as clarithromycin, rifampin, rifabutin Certain antivirals for HIV or AIDS Certain medications for fungal infections, such as  ketoconazole, posaconazole, voriconazole Certain medications for seizures, such as carbamazepine, phenobarbital, phenytoin Gemfibrozil Nefazodone St. John's wort This list may not describe all possible interactions. Give your health care provider a list of all the medicines, herbs, non-prescription drugs, or dietary supplements you use. Also tell them if you smoke, drink alcohol, or use illegal drugs. Some items may interact with your medicine. What should I watch for while using this medication? Your condition will be monitored carefully while you are receiving this medication. You may need blood work while taking this medication. This medication may make you feel generally unwell. This is not uncommon as chemotherapy can affect healthy cells as well as cancer cells. Report any side effects. Continue your course of treatment even though you feel ill unless your care team tells you to stop. This medication can cause serious side effects. To reduce the risk, your care team may give you other medications to take before receiving this one. Be sure to follow the directions from your care team. This medication may affect your coordination, reaction time, or judgement. Do not drive or operate machinery until you know how this medication affects you. Sit up or stand slowly to reduce the risk of dizzy or fainting spells. Drinking alcohol with this medication can increase the risk of these side effects. This medication may increase your risk of getting an infection. Call your care team for advice if you get a fever, chills, sore throat, or other symptoms of a cold or flu. Do not treat yourself. Try to avoid being around people who are sick. Avoid taking medications that contain aspirin, acetaminophen, ibuprofen, naproxen, or ketoprofen unless instructed by your care team. These medications may hide a fever. This medication may increase your risk to bruise or bleed. Call your care team if you notice any unusual  bleeding. Be careful brushing or flossing your teeth or using a toothpick because you may get an infection or bleed more easily. If you have any dental work done, tell your dentist you are receiving this medication. Talk to your care team if you or your partner are pregnant or think either of you might be pregnant. This medication can cause serious birth defects if taken during pregnancy and for 6 months after the last dose. You will need a negative pregnancy test before starting this medication. Contraception is recommended while taking this medication and for 6 months after the last dose. Your care team can help you find the option that works for you. Do not father a child while taking this medication and for 3 months after the last dose. Use a condom for contraception during this time period. Do not breastfeed while taking this medication and for 7 days after the last dose. This medication may cause infertility. Talk to your care team if you are concerned about your fertility. What side effects may I notice from receiving this medication? Side effects that you should report to your care team as soon as possible: Allergic reactions--skin rash, itching, hives, swelling of  the face, lips, tongue, or throat Dry cough, shortness of breath or trouble breathing Increased saliva or tears, increased sweating, stomach cramping, diarrhea, small pupils, unusual weakness or fatigue, slow heartbeat Infection--fever, chills, cough, sore throat, wounds that don't heal, pain or trouble when passing urine, general feeling of discomfort or being unwell Kidney injury--decrease in the amount of urine, swelling of the ankles, hands, or feet Low red blood cell level--unusual weakness or fatigue, dizziness, headache, trouble breathing Severe or prolonged diarrhea Unusual bruising or bleeding Side effects that usually do not require medical attention (report to your care team if they continue or are  bothersome): Constipation Diarrhea Hair loss Loss of appetite Nausea Stomach pain This list may not describe all possible side effects. Call your doctor for medical advice about side effects. You may report side effects to FDA at 1-800-FDA-1088. Where should I keep my medication? This medication is given in a hospital or clinic. It will not be stored at home. NOTE: This sheet is a summary. It may not cover all possible information. If you have questions about this medicine, talk to your doctor, pharmacist, or health care provider.  2024 Elsevier/Gold Standard (2022-03-31 00:00:00)   Fluorouracil Injection What is this medication? FLUOROURACIL (flure oh YOOR a sil) treats some types of cancer. It works by slowing down the growth of cancer cells. This medicine may be used for other purposes; ask your health care provider or pharmacist if you have questions. COMMON BRAND NAME(S): Adrucil What should I tell my care team before I take this medication? They need to know if you have any of these conditions: Blood disorders Dihydropyrimidine dehydrogenase (DPD) deficiency Infection, such as chickenpox, cold sores, herpes Kidney disease Liver disease Poor nutrition Recent or ongoing radiation therapy An unusual or allergic reaction to fluorouracil, other medications, foods, dyes, or preservatives If you or your partner are pregnant or trying to get pregnant Breast-feeding How should I use this medication? This medication is injected into a vein. It is administered by your care team in a hospital or clinic setting. Talk to your care team about the use of this medication in children. Special care may be needed. Overdosage: If you think you have taken too much of this medicine contact a poison control center or emergency room at once. NOTE: This medicine is only for you. Do not share this medicine with others. What if I miss a dose? Keep appointments for follow-up doses. It is important not  to miss your dose. Call your care team if you are unable to keep an appointment. What may interact with this medication? Do not take this medication with any of the following: Live virus vaccines This medication may also interact with the following: Medications that treat or prevent blood clots, such as warfarin, enoxaparin, dalteparin This list may not describe all possible interactions. Give your health care provider a list of all the medicines, herbs, non-prescription drugs, or dietary supplements you use. Also tell them if you smoke, drink alcohol, or use illegal drugs. Some items may interact with your medicine. What should I watch for while using this medication? Your condition will be monitored carefully while you are receiving this medication. This medication may make you feel generally unwell. This is not uncommon as chemotherapy can affect healthy cells as well as cancer cells. Report any side effects. Continue your course of treatment even though you feel ill unless your care team tells you to stop. In some cases, you may be given additional medications to  help with side effects. Follow all directions for their use. This medication may increase your risk of getting an infection. Call your care team for advice if you get a fever, chills, sore throat, or other symptoms of a cold or flu. Do not treat yourself. Try to avoid being around people who are sick. This medication may increase your risk to bruise or bleed. Call your care team if you notice any unusual bleeding. Be careful brushing or flossing your teeth or using a toothpick because you may get an infection or bleed more easily. If you have any dental work done, tell your dentist you are receiving this medication. Avoid taking medications that contain aspirin, acetaminophen, ibuprofen, naproxen, or ketoprofen unless instructed by your care team. These medications may hide a fever. Do not treat diarrhea with over the counter products.  Contact your care team if you have diarrhea that lasts more than 2 days or if it is severe and watery. This medication can make you more sensitive to the sun. Keep out of the sun. If you cannot avoid being in the sun, wear protective clothing and sunscreen. Do not use sun lamps, tanning beds, or tanning booths. Talk to your care team if you or your partner wish to become pregnant or think you might be pregnant. This medication can cause serious birth defects if taken during pregnancy and for 3 months after the last dose. A reliable form of contraception is recommended while taking this medication and for 3 months after the last dose. Talk to your care team about effective forms of contraception. Do not father a child while taking this medication and for 3 months after the last dose. Use a condom while having sex during this time period. Do not breastfeed while taking this medication. This medication may cause infertility. Talk to your care team if you are concerned about your fertility. What side effects may I notice from receiving this medication? Side effects that you should report to your care team as soon as possible: Allergic reactions--skin rash, itching, hives, swelling of the face, lips, tongue, or throat Heart attack--pain or tightness in the chest, shoulders, arms, or jaw, nausea, shortness of breath, cold or clammy skin, feeling faint or lightheaded Heart failure--shortness of breath, swelling of the ankles, feet, or hands, sudden weight gain, unusual weakness or fatigue Heart rhythm changes--fast or irregular heartbeat, dizziness, feeling faint or lightheaded, chest pain, trouble breathing High ammonia level--unusual weakness or fatigue, confusion, loss of appetite, nausea, vomiting, seizures Infection--fever, chills, cough, sore throat, wounds that don't heal, pain or trouble when passing urine, general feeling of discomfort or being unwell Low red blood cell level--unusual weakness or  fatigue, dizziness, headache, trouble breathing Pain, tingling, or numbness in the hands or feet, muscle weakness, change in vision, confusion or trouble speaking, loss of balance or coordination, trouble walking, seizures Redness, swelling, and blistering of the skin over hands and feet Severe or prolonged diarrhea Unusual bruising or bleeding Side effects that usually do not require medical attention (report to your care team if they continue or are bothersome): Dry skin Headache Increased tears Nausea Pain, redness, or swelling with sores inside the mouth or throat Sensitivity to light Vomiting This list may not describe all possible side effects. Call your doctor for medical advice about side effects. You may report side effects to FDA at 1-800-FDA-1088. Where should I keep my medication? This medication is given in a hospital or clinic. It will not be stored at home. NOTE: This  sheet is a summary. It may not cover all possible information. If you have questions about this medicine, talk to your doctor, pharmacist, or health care provider.  2024 Elsevier/Gold Standard (2022-03-25 00:00:00)

## 2023-08-25 NOTE — Progress Notes (Signed)
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  INTERVAL HISTORY: Patient returns to clinic today for further evaluation, discussion of her imaging results, and reconsideration of cycle 6 of FOLFIRI plus Avastin.  She currently feels well and is asymptomatic.  She does not complain of weakness or fatigue today.  She has a chronic peripheral neuropathy, but no other neurologic complaints.  She denies any recent fevers or illnesses.  She has no chest pain, shortness of breath, cough, or hemoptysis.  She has no abdominal pain.  She denies any nausea, vomiting, constipation, or diarrhea.  She has no melena or hematochezia.  She has no urinary complaints.  Patient offers no further specific complaints today.  REVIEW OF SYSTEMS:   Review of Systems  Constitutional: Negative.  Negative for fever, malaise/fatigue and weight loss.  Respiratory: Negative.  Negative for cough, hemoptysis and shortness of breath.   Cardiovascular: Negative.  Negative for chest pain and leg swelling.  Gastrointestinal: Negative.  Negative for abdominal pain, blood in stool, constipation, diarrhea, melena, nausea and vomiting.  Genitourinary: Negative.  Negative for dysuria.  Musculoskeletal: Negative.  Negative for back pain and falls.  Skin: Negative.  Negative for rash.  Neurological:  Positive for sensory change. Negative for dizziness, focal weakness, weakness and headaches.  Psychiatric/Behavioral: Negative.  The patient is not nervous/anxious.     As per HPI. Otherwise, a complete review of  systems is negative.  PAST MEDICAL HISTORY: Past Medical History:  Diagnosis Date   Anemia    Anxiety    Asthma    Cancer (HCC)    colon   CHF (congestive heart failure) (HCC)    COPD (chronic obstructive pulmonary disease) (HCC)    Diabetes mellitus without complication (HCC)    Hyperlipidemia    Hypertension    Stroke [facility])     PAST SURGICAL HISTORY: Past Surgical History:  Procedure Laterality Date   BIOPSY  06/26/2023   Procedure: BIOPSY;  Surgeon: Regis Bill, MD;  Location: [facility] ENDOSCOPY;  Service: Endoscopy;;   COLONOSCOPY N/A 06/26/2023   Procedure: COLONOSCOPY;  Surgeon: Regis Bill, MD;  Location: [facility] ENDOSCOPY;  Service: Endoscopy;  Laterality: N/A;   COLONOSCOPY WITH PROPOFOL N/A 02/03/2022   Procedure: COLONOSCOPY WITH PROPOFOL;  Surgeon: Toney Reil, MD;  Location: [facility] ENDOSCOPY;  Service: Gastroenterology;  Laterality: N/A;   ESOPHAGOGASTRODUODENOSCOPY N/A 06/26/2023   Procedure: ESOPHAGOGASTRODUODENOSCOPY (EGD);  Surgeon: Regis Bill, MD;  Location: [facility] ENDOSCOPY;  Service: Endoscopy;  Laterality: N/A;   ESOPHAGOGASTRODUODENOSCOPY (EGD) WITH PROPOFOL N/A 02/03/2022   Procedure: ESOPHAGOGASTRODUODENOSCOPY (EGD) WITH PROPOFOL;  Surgeon: Toney Reil, MD;  Location: [facility] ENDOSCOPY;  Service: Gastroenterology;  Laterality: N/A;   GIVENS CAPSULE STUDY N/A 02/03/2022   Procedure: GIVENS CAPSULE STUDY;  Surgeon: Toney Reil, MD;  Location: [facility] ENDOSCOPY;  Service: Gastroenterology;  Laterality: N/A;  possible Capsule Study   IR IMAGING GUIDED PORT INSERTION  06/18/2022   POLYPECTOMY  06/26/2023   Procedure: POLYPECTOMY;  Surgeon: Regis Bill, MD;  Location: [facility] ENDOSCOPY;  Service: Endoscopy;;    FAMILY HISTORY: Family History  Problem Relation Age of Onset  Diabetes Mother    Heart attack Father    Colon cancer Father     ADVANCED DIRECTIVES (Y/N):  N  HEALTH MAINTENANCE: Social History   Tobacco Use    Smoking status: Former    Types: E-cigarettes    Quit date: 12/08/2022    Years since quitting: 0.7   Tobacco comments:    Quit Cigarettes 12/2021  Vaping Use   Vaping status: Every Day   Substances: Nicotine, THC  Substance Use Topics   Alcohol use: Yes    Comment: rare   Drug use: Yes    Types: Marijuana     Colonoscopy:  PAP:  Bone density:  Lipid panel:  Allergies  Allergen Reactions   Codeine Rash    Happened in 1970s    Current Outpatient Medications  Medication Sig Dispense Refill   acetaminophen (TYLENOL) 325 MG tablet Take 650 mg by mouth every 6 (six) hours as needed.     albuterol (VENTOLIN HFA) 108 (90 Base) MCG/ACT inhaler Inhale into the lungs.     atorvastatin (LIPITOR) 40 MG tablet Take 40 mg by mouth daily.     clonazePAM (KLONOPIN) 1 MG tablet Take 1 mg by mouth 2 (two) times daily.     fenofibrate 54 MG tablet Take 54 mg by mouth daily.     gabapentin (NEURONTIN) 600 MG tablet Take by mouth 2 (two) times daily.     LANTUS SOLOSTAR 100 UNIT/ML Solostar Pen Inject 12 Units into the skin at bedtime.     lisinopril (ZESTRIL) 20 MG tablet Take 20 mg by mouth daily.     Magnesium Citrate 200 MG TABS Take 1 tablet by mouth in the morning and at bedtime. 250 twice daily     metoprolol succinate (TOPROL-XL) 50 MG 24 hr tablet Take 50 mg by mouth daily. Take with or immediately following a meal.     OLANZapine (ZYPREXA) 10 MG tablet Take 0.5-1 tablets (5-10 mg total) by mouth at bedtime as needed (nausea). 30 tablet 1   pantoprazole (PROTONIX) 40 MG tablet Take 1 tablet (40 mg total) by mouth daily. 30 tablet 5   ranolazine (RANEXA) 500 MG 12 hr tablet Take 500 mg by mouth 2 (two) times daily.     sitaGLIPtin-metformin (JANUMET) 50-1000 MG tablet Take 1 tablet by mouth daily.     venlafaxine XR (EFFEXOR-XR) 75 MG 24 hr capsule Take 75 mg by mouth daily.     insulin lispro (HUMALOG) 100 UNIT/ML injection Inject 1-10 Units into the skin 3 (three) times daily before  meals. Per sliding scale (Patient not taking: Reported on 08/25/2023)     No current facility-administered medications for this visit.   Facility-Administered Medications Ordered in Other Visits  Medication Dose Route Frequency Provider Last Rate Last Admin   bevacizumab-adcd (VEGZELMA) 300 mg in sodium chloride 0.9 % 100 mL chemo infusion  5 mg/kg (Treatment Plan Recorded) Intravenous Once Jeralyn Ruths, MD       dexamethasone (DECADRON) 10 mg in sodium chloride 0.9 % 50 mL IVPB  10 mg Intravenous Once Jeralyn Ruths, MD 204 mL/hr at 08/25/23 0945 10 mg at 08/25/23 0945   fluorouracil (ADRUCIL) 3,900 mg in sodium chloride 0.9 % 72 mL chemo infusion  2,400 mg/m2 (Treatment Plan Recorded) Intravenous 1 day or 1 dose Jeralyn Ruths, MD       fluorouracil (ADRUCIL) chemo injection 650 mg  400 mg/m2 (Treatment Plan Recorded) Intravenous Once Jeralyn Ruths, MD  irinotecan (CAMPTOSAR) 300 mg in sodium chloride 0.9 % 500 mL chemo infusion  180 mg/m2 (Treatment Plan Recorded) Intravenous Once Jeralyn Ruths, MD       leucovorin 652 mg in sodium chloride 0.9 % 250 mL infusion  400 mg/m2 (Treatment Plan Recorded) Intravenous Once Jeralyn Ruths, MD        OBJECTIVE: Vitals:   08/25/23 0853  BP: (!) 153/67  Pulse: 87  Temp: (!) 96.9 F (36.1 C)  SpO2: 96%        Body mass index is 23.24 kg/m.    ECOG FS:1 - Symptomatic but completely ambulatory  General: Well-developed, well-nourished, no acute distress.  Sitting in a wheelchair. Eyes: Pink conjunctiva, anicteric sclera. HEENT: Normocephalic, moist mucous membranes. Lungs: No audible wheezing or coughing. Heart: Regular rate and rhythm. Abdomen: Soft, nontender, no obvious distention. Musculoskeletal: No edema, cyanosis, or clubbing. Neuro: Alert, answering all questions appropriately. Cranial nerves grossly intact. Skin: No rashes or petechiae noted. Psych: Normal affect.  LAB RESULTS:  Lab Results   Component Value Date   NA 140 08/25/2023   K 3.5 08/25/2023   CL 104 08/25/2023   CO2 29 08/25/2023   GLUCOSE 127 (H) 08/25/2023   BUN 8 08/25/2023   CREATININE 0.69 08/25/2023   CALCIUM 8.6 (L) 08/25/2023   PROT 6.1 (L) 08/25/2023   ALBUMIN 3.2 (L) 08/25/2023   AST 15 08/25/2023   ALT 11 08/25/2023   ALKPHOS 48 08/25/2023   BILITOT 0.6 08/25/2023   GFRNONAA >60 08/25/2023    Lab Results  Component Value Date   WBC 6.4 08/25/2023   NEUTROABS 4.9 08/25/2023   HGB 8.9 (L) 08/25/2023   HCT 28.8 (L) 08/25/2023   MCV 92.3 08/25/2023   PLT 203 08/25/2023     STUDIES: CT CHEST ABDOMEN PELVIS W CONTRAST  Result Date: 08/24/2023 CLINICAL DATA:  Transverse colon cancer restaging * Tracking Code: BO * EXAM: CT CHEST, ABDOMEN, AND PELVIS WITH CONTRAST TECHNIQUE: Multidetector CT imaging of the chest, abdomen and pelvis was performed following the standard protocol during bolus administration of intravenous contrast. RADIATION DOSE REDUCTION: This exam was performed according to the departmental dose-optimization program which includes automated exposure control, adjustment of the mA and/or kV according to patient size and/or use of iterative reconstruction technique. CONTRAST:  OMNIPAQUE IOHEXOL 300 MG/ML SOLN additional oral enteric contrast COMPARISON:  PET-CT, 05/15/2023, CT chest abdomen pelvis, 04/30/2023 FINDINGS: CT CHEST FINDINGS Cardiovascular: Right chest port catheter. Aortic atherosclerosis. Normal heart size. Three-vessel coronary artery calcifications. No pericardial effusion. Mediastinum/Nodes: No enlarged mediastinal, hilar, or axillary lymph nodes. Thyroid gland, trachea, and esophagus demonstrate no significant findings. Lungs/Pleura: Moderate centrilobular and paraseptal emphysema. Diffuse bilateral bronchial wall thickening. Occasional small pulmonary nodules unchanged, for example a 0.4 cm nodule of the posterior left upper lobe (series 4, image 44). No pleural  effusion or pneumothorax. Musculoskeletal: No chest wall abnormality. No acute osseous findings. CT ABDOMEN PELVIS FINDINGS Hepatobiliary: No solid liver abnormality is seen. Small gallstones. No gallbladder wall thickening, or biliary dilatation. Pancreas: Unremarkable. No pancreatic ductal dilatation or surrounding inflammatory changes. Spleen: Normal in size without significant abnormality. Adrenals/Urinary Tract: Unchanged, definitively benign bilateral adrenal adenomata and left adrenal myelolipoma, for which no further follow-up or characterization is required. Kidneys are normal, without renal calculi, solid lesion, or hydronephrosis. Bladder is unremarkable. Stomach/Bowel: Stomach is within normal limits. Status post inferior gastric resection and reanastomosis. Interval enlargement of a low-attenuation soft tissue nodule about the resection margin, previously with FDG avidity  although difficult to distinctly measure on prior noncontrast PET-CT, on today's examination measuring 2.9 x 2.5 cm (series 2, image 64). Status post proximal small bowel resection and reanastomosis. Interval enlargement of a near fluid attenuation collection adjacent to the anastomosis, previously with rim FDG avidity, measuring 2.8 x 2.5 cm (series 2, image 79). Status post right hemicolectomy and ileocolic anastomosis. Descending and sigmoid diverticulosis. Diminished size of a soft tissue nodule in the ventral left hemiabdomen adjacent to loops of mid small bowel, previously with rim FDG avidity, measuring 3.1 x 2.9 cm, previously 4.0 x 3.7 cm when measured similarly (series 2, image 90). Vascular/Lymphatic: Aortic atherosclerosis. No enlarged abdominal or pelvic lymph nodes. Reproductive: No mass or other abnormality. Other: Ventral mesh hernia repair.  No ascites. Musculoskeletal: No acute osseous findings. IMPRESSION: 1. Status post inferior gastric resection and reanastomosis. Interval enlargement of a low-attenuation soft  tissue nodule about the resection margin, previously with FDG avidity although difficult to distinctly measure on prior noncontrast PET-CT, on today's examination measuring 2.9 x 2.5 cm. 2. Status post proximal small bowel resection and reanastomosis. Interval enlargement of a near fluid attenuation collection adjacent to the anastomosis, previously with rim FDG avidity. 3. Diminished size of a soft tissue nodule in the ventral left hemiabdomen adjacent to loops of mid small bowel, previously with rim FDG avidity. 4. No convincing evidence of lymphadenopathy or metastatic disease in the chest. Occasional small pulmonary nodules unchanged, measuring 0.4 cm and smaller, most likely benign and incidental. Attention on follow-up. 5. Emphysema and diffuse bilateral bronchial wall thickening. 6. Coronary artery disease. 7. Cholelithiasis. Aortic Atherosclerosis (ICD10-I70.0) and Emphysema (ICD10-J43.9). Electronically Signed   By: Jearld Lesch M.D.   On: 08/24/2023 11:37    ASSESSMENT: Stage IIIc colon cancer.  PLAN:    Stage IIIc colon cancer: Patient underwent complete surgical resection at [facility] on May 23, 2022.  Large colon mass was noted to partially invade stomach as well as surrounding mesentery.  PET scan did not reveal any metastatic disease other than a positive lung nodule which was confirmed to be a second primary.  Patient completed her final cycle of adjuvant FOLFOX on December 23, 2022.  Patient's CEA trended up to 181, but now has decreased to 122.  Her most recent result was 131.  PET scan results from May 20, 2023 reviewed independently with clear progression of disease.  Patient was seen at [facility] and determined not to be a surgical candidate.  Repeat CT scan on August 24, 2023 reviewed independently and report as above with essentially stable disease.  Proceed with cycle 6 of treatment today.  Return to clinic in 2 days for pump removal and then in 2 weeks for further  evaluation and consideration of cycle 7.  Will reimage after cycle 12.   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  Patient completed XRT in May 2023.  PET and CT scan as above with no obvious evidence of recurrence.  Repeat imaging as above. Pain: Resolved. Anemia: Hemoglobin improved to 8.9 with 1 unit of packed red blood cells.   Neutropenia: Resolved.  Proceed with treatment as above.  Patient may require Udenyca in the near future. Neuropathy/falls: Patient does not complain of this today.  Patient was previously given a referral to Occupational Therapy. Coping/anxiety: Patient was previously given a referral to [facility]. Incomplete bladder emptying: Patient does not complain of this today.  Patient expressed understanding and was in agreement with this plan. She also understands that She can call clinic at any time with any questions, concerns, or complaints.    Cancer Staging  Colon cancer [facility]) Staging form: Colon and Rectum, AJCC 8th Edition - Pathologic stage from 06/10/2022: Stage IIIC (pT4b, pN1c, cM0) - Signed by Jeralyn Ruths, MD on 06/10/2022 Stage prefix: Initial diagnosis Total positive nodes: 0 Histologic grading system: 4 grade system Histologic grade (G): G3   Jeralyn Ruths, MD   08/25/2023 9:50 AM

## 2023-08-27 ENCOUNTER — Inpatient Hospital Stay: Payer: Medicare Other

## 2023-08-27 VITALS — BP 133/98 | HR 98 | Temp 98.8°F | Resp 18

## 2023-08-27 DIAGNOSIS — C184 Malignant neoplasm of transverse colon: Secondary | ICD-10-CM

## 2023-08-27 MED ORDER — SODIUM CHLORIDE 0.9% FLUSH
10.0000 mL | INTRAVENOUS | Status: DC | PRN
  Filled 2023-08-27: qty 10

## 2023-08-27 MED ORDER — HEPARIN SOD (PORK) LOCK FLUSH 100 UNIT/ML IV SOLN
500.0000 [IU] | Freq: Once | INTRAVENOUS | Status: DC | PRN
  Filled 2023-08-27: qty 5

## 2023-08-31 ENCOUNTER — Ambulatory Visit
Admission: RE | Admit: 2023-08-31 | Discharge: 2023-08-31 | Disposition: A | Discharge status: Home / Self Care | Payer: Medicare Other | Source: Ambulatory Visit | Attending: Radiation Oncology | Admitting: Radiation Oncology

## 2023-08-31 ENCOUNTER — Encounter: Payer: Self-pay | Admitting: Radiation Oncology

## 2023-08-31 VITALS — BP 133/72 | HR 94 | Temp 98.6°F | Resp 19 | Wt 129.5 lb

## 2023-08-31 DIAGNOSIS — Z923 Personal history of irradiation: Secondary | ICD-10-CM | POA: Insufficient documentation

## 2023-08-31 DIAGNOSIS — Z85118 Personal history of other malignant neoplasm of bronchus and lung: Secondary | ICD-10-CM | POA: Diagnosis present

## 2023-08-31 DIAGNOSIS — Z9221 Personal history of antineoplastic chemotherapy: Secondary | ICD-10-CM | POA: Insufficient documentation

## 2023-08-31 DIAGNOSIS — C189 Malignant neoplasm of colon, unspecified: Secondary | ICD-10-CM | POA: Insufficient documentation

## 2023-08-31 DIAGNOSIS — C3432 Malignant neoplasm of lower lobe, left bronchus or lung: Secondary | ICD-10-CM

## 2023-08-31 NOTE — Progress Notes (Signed)
Radiation Oncology Follow up Note  Name: Jessica Stout   Date:   08/31/2023 MRN:  [id number] DOB: [birth year]    This 72 y.o. female presents to the clinic today for 33-month follow-up status post SBRT for stage I non-small cell lung cancer of the left lower lobe.  REFERRING PROVIDER: Jaclyn Shaggy, MD  HPI: Patient is a 72 year old female now out 16 months having pleated SBRT to her left lower lobe for stage I non-small cell lung cancer..  She is currently undergoing FOLFIRI plus Avastin chemotherapy for recurrent colon cancer originally stage IIIc.  Her last PET CT scan which I have reviewed in June shows clear progression of disease o her colon cancer.  There is no evidence of disease progression or disease in her chest with consideration of the left lower lobe previous stage I disease.  She specifically [place] cough hemoptysis or chest tightness.  She is having no significant abdominal pain at this time.  COMPLICATIONS OF TREATMENT: none  FOLLOW UP COMPLIANCE: keeps appointments   PHYSICAL EXAM:  BP 133/72   Pulse 94   Temp 98.6 F (37 C)   Resp 19   Wt 129 lb 8 oz (58.7 kg)   SpO2 99%   BMI 22.23 kg/m  Well-developed well-nourished patient in NAD. HEENT reveals PERLA, EOMI, discs not visualized.  Oral cavity is clear. No oral mucosal lesions are identified. Neck is clear without evidence of cervical or supraclavicular adenopathy. Lungs are clear to A&P. Cardiac examination is essentially unremarkable with regular rate and rhythm without murmur rub or thrill. Abdomen is benign with no organomegaly or masses noted. Motor sensory and DTR levels are equal and symmetric in the upper and lower extremities. Cranial nerves II through XII are grossly intact. Proprioception is intact. No peripheral adenopathy or edema is identified. No motor or sensory levels are noted. Crude visual fields are within normal range.  RADIOLOGY RESULTS: CT scans and PET CT scans reviewed compatible with above-stated  findings  PLAN: Present time patient is doing well with complete resolution of her lung mass by SBRT.  At this time she is being treated for progressive colon cancer.  At this time I am going to turn follow-up care over to medical oncology.  I would be happy to reevaluate her patient should she benefit from any palliative radiation therapy in the future.  Patient knows to call with any concerns.  I would like to take this opportunity to thank you for allowing me to participate in the care of your patient.Carmina Miller, MD

## 2023-09-01 ENCOUNTER — Ambulatory Visit: Payer: Medicare Other

## 2023-09-01 ENCOUNTER — Other Ambulatory Visit: Payer: Medicare Other

## 2023-09-01 ENCOUNTER — Ambulatory Visit: Payer: Medicare Other | Admitting: Oncology

## 2023-09-03 ENCOUNTER — Encounter: Payer: Self-pay | Admitting: Oncology

## 2023-09-07 MED FILL — Dexamethasone Sodium Phosphate Inj 100 MG/10ML: INTRAMUSCULAR | Qty: 1 | Status: AC

## 2023-09-08 ENCOUNTER — Inpatient Hospital Stay (HOSPITAL_BASED_OUTPATIENT_CLINIC_OR_DEPARTMENT_OTHER): Payer: Medicare Other | Admitting: Oncology

## 2023-09-08 ENCOUNTER — Encounter: Payer: Self-pay | Admitting: Oncology

## 2023-09-08 ENCOUNTER — Inpatient Hospital Stay: Payer: Medicare Other | Attending: Oncology

## 2023-09-08 ENCOUNTER — Inpatient Hospital Stay: Payer: Medicare Other

## 2023-09-08 DIAGNOSIS — F17290 Nicotine dependence, other tobacco product, uncomplicated: Secondary | ICD-10-CM | POA: Insufficient documentation

## 2023-09-08 DIAGNOSIS — C184 Malignant neoplasm of transverse colon: Secondary | ICD-10-CM | POA: Diagnosis not present

## 2023-09-08 DIAGNOSIS — D649 Anemia, unspecified: Secondary | ICD-10-CM | POA: Diagnosis not present

## 2023-09-08 DIAGNOSIS — R339 Retention of urine, unspecified: Secondary | ICD-10-CM | POA: Insufficient documentation

## 2023-09-08 DIAGNOSIS — R630 Anorexia: Secondary | ICD-10-CM | POA: Insufficient documentation

## 2023-09-08 DIAGNOSIS — Z5111 Encounter for antineoplastic chemotherapy: Secondary | ICD-10-CM | POA: Insufficient documentation

## 2023-09-08 DIAGNOSIS — R5383 Other fatigue: Secondary | ICD-10-CM | POA: Diagnosis not present

## 2023-09-08 DIAGNOSIS — Z85118 Personal history of other malignant neoplasm of bronchus and lung: Secondary | ICD-10-CM | POA: Insufficient documentation

## 2023-09-08 DIAGNOSIS — G629 Polyneuropathy, unspecified: Secondary | ICD-10-CM | POA: Insufficient documentation

## 2023-09-08 DIAGNOSIS — Z923 Personal history of irradiation: Secondary | ICD-10-CM | POA: Insufficient documentation

## 2023-09-08 DIAGNOSIS — Z800 Family history of malignant neoplasm of digestive organs: Secondary | ICD-10-CM | POA: Diagnosis not present

## 2023-09-08 DIAGNOSIS — R531 Weakness: Secondary | ICD-10-CM | POA: Diagnosis not present

## 2023-09-08 DIAGNOSIS — F419 Anxiety disorder, unspecified: Secondary | ICD-10-CM | POA: Insufficient documentation

## 2023-09-08 LAB — CBC WITH DIFFERENTIAL (CANCER CENTER ONLY)
Abs Immature Granulocytes: 0.01 10*3/uL (ref 0.00–0.07)
Basophils Absolute: 0 10*3/uL (ref 0.0–0.1)
Basophils Relative: 0 %
Eosinophils Absolute: 0.1 10*3/uL (ref 0.0–0.5)
Eosinophils Relative: 3 %
HCT: 26.2 % — ABNORMAL LOW (ref 36.0–46.0)
Hemoglobin: 8.2 g/dL — ABNORMAL LOW (ref 12.0–15.0)
Immature Granulocytes: 0 %
Lymphocytes Relative: 16 %
Lymphs Abs: 0.5 10*3/uL — ABNORMAL LOW (ref 0.7–4.0)
MCH: 28.4 pg (ref 26.0–34.0)
MCHC: 31.3 g/dL (ref 30.0–36.0)
MCV: 90.7 fL (ref 80.0–100.0)
Monocytes Absolute: 0.5 10*3/uL (ref 0.1–1.0)
Monocytes Relative: 16 %
Neutro Abs: 2.1 10*3/uL (ref 1.7–7.7)
Neutrophils Relative %: 65 %
Platelet Count: 149 10*3/uL — ABNORMAL LOW (ref 150–400)
RBC: 2.89 MIL/uL — ABNORMAL LOW (ref 3.87–5.11)
RDW: 17.3 % — ABNORMAL HIGH (ref 11.5–15.5)
WBC Count: 3.3 10*3/uL — ABNORMAL LOW (ref 4.0–10.5)
nRBC: 0 % (ref 0.0–0.2)

## 2023-09-08 LAB — CMP (CANCER CENTER ONLY)
ALT: 8 U/L (ref 0–44)
AST: 13 U/L — ABNORMAL LOW (ref 15–41)
Albumin: 3.4 g/dL — ABNORMAL LOW (ref 3.5–5.0)
Alkaline Phosphatase: 42 U/L (ref 38–126)
Anion gap: 6 (ref 5–15)
BUN: 13 mg/dL (ref 8–23)
CO2: 27 mmol/L (ref 22–32)
Calcium: 8.7 mg/dL — ABNORMAL LOW (ref 8.9–10.3)
Chloride: 105 mmol/L (ref 98–111)
Creatinine: 0.82 mg/dL (ref 0.44–1.00)
GFR, Estimated: >60 mL/min (ref >60)
Glucose, Bld: 118 mg/dL — ABNORMAL HIGH (ref 70–99)
Potassium: 3.6 mmol/L (ref 3.5–5.1)
Sodium: 138 mmol/L (ref 135–145)
Total Bilirubin: 0.5 mg/dL (ref 0.3–1.2)
Total Protein: 6.5 g/dL (ref 6.5–8.1)

## 2023-09-08 LAB — PROTEIN, URINE, RANDOM: Total Protein, Urine: 51 mg/dL

## 2023-09-08 MED ORDER — SODIUM CHLORIDE 0.9 % IV SOLN
10.0000 mg | Freq: Once | INTRAVENOUS | Status: AC
  Administered 2023-09-08: 10 mg via INTRAVENOUS
  Filled 2023-09-08: qty 10

## 2023-09-08 MED ORDER — SODIUM CHLORIDE 0.9 % IV SOLN
Freq: Once | INTRAVENOUS | Status: AC
  Filled 2023-09-08: qty 250

## 2023-09-08 MED ORDER — PALONOSETRON HCL INJECTION 0.25 MG/5ML
0.2500 mg | Freq: Once | INTRAVENOUS | Status: AC
  Administered 2023-09-08: 0.25 mg via INTRAVENOUS
  Filled 2023-09-08: qty 5

## 2023-09-08 MED ORDER — HEPARIN SOD (PORK) LOCK FLUSH 100 UNIT/ML IV SOLN
500.0000 [IU] | Freq: Once | INTRAVENOUS | Status: DC | PRN
  Filled 2023-09-08: qty 5

## 2023-09-08 MED ORDER — SODIUM CHLORIDE 0.9 % IV SOLN
2400.0000 mg/m2 | INTRAVENOUS | Status: DC
  Administered 2023-09-08: 3900 mg via INTRAVENOUS
  Filled 2023-09-08: qty 78

## 2023-09-08 MED ORDER — SODIUM CHLORIDE 0.9 % IV SOLN
400.0000 mg/m2 | Freq: Once | INTRAVENOUS | Status: AC
  Administered 2023-09-08: 652 mg via INTRAVENOUS
  Filled 2023-09-08: qty 32.6

## 2023-09-08 MED ORDER — SODIUM CHLORIDE 0.9% FLUSH
10.0000 mL | INTRAVENOUS | Status: DC | PRN
  Administered 2023-09-08: 10 mL via INTRAVENOUS
  Filled 2023-09-08: qty 10

## 2023-09-08 MED ORDER — FLUOROURACIL CHEMO INJECTION 2.5 GM/50ML
400.0000 mg/m2 | Freq: Once | INTRAVENOUS | Status: AC
  Administered 2023-09-08: 650 mg via INTRAVENOUS
  Filled 2023-09-08: qty 13

## 2023-09-08 MED ORDER — SODIUM CHLORIDE 0.9 % IV SOLN
5.0000 mg/kg | Freq: Once | INTRAVENOUS | Status: AC
  Administered 2023-09-08: 300 mg via INTRAVENOUS
  Filled 2023-09-08: qty 12

## 2023-09-08 MED ORDER — SODIUM CHLORIDE 0.9% FLUSH
10.0000 mL | INTRAVENOUS | Status: DC | PRN
  Filled 2023-09-08: qty 10

## 2023-09-08 MED ORDER — SODIUM CHLORIDE 0.9 % IV SOLN
180.0000 mg/m2 | Freq: Once | INTRAVENOUS | Status: AC
  Administered 2023-09-08: 300 mg via INTRAVENOUS
  Filled 2023-09-08: qty 15

## 2023-09-08 NOTE — Progress Notes (Signed)
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  INTERVAL HISTORY: Patient returns to clinic today for further evaluation and consideration of cycle 7 of FOLFIRI plus Avastin.  She currently feels well and is asymptomatic.  She is tolerating her treatment without significant side effects.  She has not complained of any weakness or fatigue.  She has a chronic peripheral neuropathy, but no other neurologic complaints.  She denies any recent fevers or illnesses.  She has no chest pain, shortness of breath, cough, or hemoptysis.  She has no abdominal pain.  She denies any nausea, vomiting, constipation, or diarrhea.  She has no melena or hematochezia.  She has no urinary complaints.  Patient offers no further specific complaints today.  REVIEW OF SYSTEMS:   Review of Systems  Constitutional: Negative.  Negative for fever, malaise/fatigue and weight loss.  Respiratory: Negative.  Negative for cough, hemoptysis and shortness of breath.   Cardiovascular: Negative.  Negative for chest pain and leg swelling.  Gastrointestinal: Negative.  Negative for abdominal pain, blood in stool, constipation, diarrhea, melena, nausea and vomiting.  Genitourinary: Negative.  Negative for dysuria.  Musculoskeletal: Negative.  Negative for back pain and falls.  Skin: Negative.  Negative for rash.  Neurological:  Positive for sensory change. Negative for dizziness, focal weakness, weakness and headaches.  Psychiatric/Behavioral: Negative.   The patient is not nervous/anxious.     As per HPI. Otherwise, a complete review of systems is negative.  PAST MEDICAL HISTORY: Past Medical History:  Diagnosis Date   Anemia    Anxiety    Asthma    Cancer (HCC)    colon   CHF (congestive heart failure) (HCC)    COPD (chronic obstructive pulmonary disease) (HCC)    Diabetes mellitus without complication (HCC)    Hyperlipidemia    Hypertension    Stroke [facility])     PAST SURGICAL HISTORY: Past Surgical History:  Procedure Laterality Date   BIOPSY  06/26/2023   Procedure: BIOPSY;  Surgeon: Regis Bill, MD;  Location: [facility] ENDOSCOPY;  Service: Endoscopy;;   COLONOSCOPY N/A 06/26/2023   Procedure: COLONOSCOPY;  Surgeon: Regis Bill, MD;  Location: [facility] ENDOSCOPY;  Service: Endoscopy;  Laterality: N/A;   COLONOSCOPY WITH PROPOFOL N/A 02/03/2022   Procedure: COLONOSCOPY WITH PROPOFOL;  Surgeon: Toney Reil, MD;  Location: [facility] ENDOSCOPY;  Service: Gastroenterology;  Laterality: N/A;   ESOPHAGOGASTRODUODENOSCOPY N/A 06/26/2023   Procedure: ESOPHAGOGASTRODUODENOSCOPY (EGD);  Surgeon: Regis Bill, MD;  Location: [facility] ENDOSCOPY;  Service: Endoscopy;  Laterality: N/A;   ESOPHAGOGASTRODUODENOSCOPY (EGD) WITH PROPOFOL N/A 02/03/2022   Procedure: ESOPHAGOGASTRODUODENOSCOPY (EGD) WITH PROPOFOL;  Surgeon: Toney Reil, MD;  Location: [facility] ENDOSCOPY;  Service: Gastroenterology;  Laterality: N/A;   GIVENS CAPSULE STUDY N/A 02/03/2022   Procedure: GIVENS CAPSULE STUDY;  Surgeon: Toney Reil, MD;  Location: [facility] ENDOSCOPY;  Service: Gastroenterology;  Laterality: N/A;  possible Capsule Study   IR IMAGING GUIDED PORT INSERTION  06/18/2022   POLYPECTOMY  06/26/2023   Procedure: POLYPECTOMY;  Surgeon: Regis Bill, MD;  Location: [facility] ENDOSCOPY;  Service: Endoscopy;;  FAMILY HISTORY: Family History  Problem Relation Age of Onset   Diabetes Mother    Heart attack Father    Colon cancer Father      ADVANCED DIRECTIVES (Y/N):  N  HEALTH MAINTENANCE: Social History   Tobacco Use   Smoking status: Former    Types: E-cigarettes    Quit date: 12/08/2022    Years since quitting: 0.7   Tobacco comments:    Quit Cigarettes 12/2021  Vaping Use   Vaping status: Every Day   Substances: Nicotine, THC  Substance Use Topics   Alcohol use: Yes    Comment: rare   Drug use: Yes    Types: Marijuana     Colonoscopy:  PAP:  Bone density:  Lipid panel:  Allergies  Allergen Reactions   Codeine Rash    Happened in 1970s    Current Outpatient Medications  Medication Sig Dispense Refill   acetaminophen (TYLENOL) 325 MG tablet Take 650 mg by mouth every 6 (six) hours as needed.     albuterol (VENTOLIN HFA) 108 (90 Base) MCG/ACT inhaler Inhale into the lungs.     atorvastatin (LIPITOR) 40 MG tablet Take 40 mg by mouth daily.     clonazePAM (KLONOPIN) 1 MG tablet Take 1 mg by mouth 2 (two) times daily.     fenofibrate 54 MG tablet Take 54 mg by mouth daily.     gabapentin (NEURONTIN) 600 MG tablet Take by mouth 2 (two) times daily.     LANTUS SOLOSTAR 100 UNIT/ML Solostar Pen Inject 12 Units into the skin at bedtime.     lisinopril (ZESTRIL) 20 MG tablet Take 20 mg by mouth daily.     Magnesium Citrate 200 MG TABS Take 1 tablet by mouth in the morning and at bedtime. 250 twice daily     metoprolol succinate (TOPROL-XL) 50 MG 24 hr tablet Take 50 mg by mouth daily. Take with or immediately following a meal.     OLANZapine (ZYPREXA) 10 MG tablet Take 0.5-1 tablets (5-10 mg total) by mouth at bedtime as needed (nausea). 30 tablet 1   ranolazine (RANEXA) 500 MG 12 hr tablet Take 500 mg by mouth 2 (two) times daily.     sitaGLIPtin-metformin (JANUMET) 50-1000 MG tablet Take 1 tablet by mouth daily.     venlafaxine XR (EFFEXOR-XR) 75 MG 24 hr capsule Take 75 mg by mouth daily.     insulin lispro (HUMALOG) 100 UNIT/ML injection Inject 1-10 Units into the skin 3 (three) times daily before  meals. Per sliding scale (Patient not taking: Reported on 08/25/2023)     pantoprazole (PROTONIX) 40 MG tablet Take 1 tablet (40 mg total) by mouth daily. 30 tablet 5   No current facility-administered medications for this visit.   Facility-Administered Medications Ordered in Other Visits  Medication Dose Route Frequency Provider Last Rate Last Admin   bevacizumab-adcd (VEGZELMA) 300 mg in sodium chloride 0.9 % 100 mL chemo infusion  5 mg/kg (Treatment Plan Recorded) Intravenous Once Jeralyn Ruths, MD       dexamethasone (DECADRON) 10 mg in sodium chloride 0.9 % 50 mL IVPB  10 mg Intravenous Once Jeralyn Ruths, MD 204 mL/hr at 09/08/23 1007 10 mg at 09/08/23 1007   fluorouracil (ADRUCIL) 3,900 mg in sodium chloride 0.9 % 72 mL chemo infusion  2,400 mg/m2 (Treatment Plan Recorded) Intravenous 1 day or 1 dose Jeralyn Ruths, MD       fluorouracil (ADRUCIL) chemo injection 650 mg  400 mg/m2 (Treatment  Plan Recorded) Intravenous Once Jeralyn Ruths, MD       heparin lock flush 100 unit/mL  500 Units Intracatheter Once PRN Jeralyn Ruths, MD       irinotecan (CAMPTOSAR) 300 mg in sodium chloride 0.9 % 500 mL chemo infusion  180 mg/m2 (Treatment Plan Recorded) Intravenous Once Jeralyn Ruths, MD       leucovorin 652 mg in sodium chloride 0.9 % 250 mL infusion  400 mg/m2 (Treatment Plan Recorded) Intravenous Once Jeralyn Ruths, MD       sodium chloride flush (NS) 0.9 % injection 10 mL  10 mL Intravenous PRN Jeralyn Ruths, MD   10 mL at 09/08/23 0824   sodium chloride flush (NS) 0.9 % injection 10 mL  10 mL Intracatheter PRN Jeralyn Ruths, MD        OBJECTIVE: Vitals:   09/08/23 0845  BP: (!) 158/74  Pulse: 92  Resp: 16  Temp: 98.7 F (37.1 C)  SpO2: 99%        Body mass index is 22.57 kg/m.    ECOG FS:1 - Symptomatic but completely ambulatory  General: Well-developed, well-nourished, no acute distress.  Sitting in a wheelchair. Eyes: Pink  conjunctiva, anicteric sclera. HEENT: Normocephalic, moist mucous membranes. Lungs: No audible wheezing or coughing. Heart: Regular rate and rhythm. Abdomen: Soft, nontender, no obvious distention. Musculoskeletal: No edema, cyanosis, or clubbing. Neuro: Alert, answering all questions appropriately. Cranial nerves grossly intact. Skin: No rashes or petechiae noted. Psych: Normal affect.  LAB RESULTS:  Lab Results  Component Value Date   NA 138 09/08/2023   K 3.6 09/08/2023   CL 105 09/08/2023   CO2 27 09/08/2023   GLUCOSE 118 (H) 09/08/2023   BUN 13 09/08/2023   CREATININE 0.82 09/08/2023   CALCIUM 8.7 (L) 09/08/2023   PROT 6.5 09/08/2023   ALBUMIN 3.4 (L) 09/08/2023   AST 13 (L) 09/08/2023   ALT 8 09/08/2023   ALKPHOS 42 09/08/2023   BILITOT 0.5 09/08/2023   GFRNONAA >60 09/08/2023    Lab Results  Component Value Date   WBC 3.3 (L) 09/08/2023   NEUTROABS 2.1 09/08/2023   HGB 8.2 (L) 09/08/2023   HCT 26.2 (L) 09/08/2023   MCV 90.7 09/08/2023   PLT 149 (L) 09/08/2023     STUDIES: CT CHEST ABDOMEN PELVIS W CONTRAST  Result Date: 08/24/2023 CLINICAL DATA:  Transverse colon cancer restaging * Tracking Code: BO * EXAM: CT CHEST, ABDOMEN, AND PELVIS WITH CONTRAST TECHNIQUE: Multidetector CT imaging of the chest, abdomen and pelvis was performed following the standard protocol during bolus administration of intravenous contrast. RADIATION DOSE REDUCTION: This exam was performed according to the departmental dose-optimization program which includes automated exposure control, adjustment of the mA and/or kV according to patient size and/or use of iterative reconstruction technique. CONTRAST:  OMNIPAQUE IOHEXOL 300 MG/ML SOLN additional oral enteric contrast COMPARISON:  PET-CT, 05/15/2023, CT chest abdomen pelvis, 04/30/2023 FINDINGS: CT CHEST FINDINGS Cardiovascular: Right chest port catheter. Aortic atherosclerosis. Normal heart size. Three-vessel coronary artery  calcifications. No pericardial effusion. Mediastinum/Nodes: No enlarged mediastinal, hilar, or axillary lymph nodes. Thyroid gland, trachea, and esophagus demonstrate no significant findings. Lungs/Pleura: Moderate centrilobular and paraseptal emphysema. Diffuse bilateral bronchial wall thickening. Occasional small pulmonary nodules unchanged, for example a 0.4 cm nodule of the posterior left upper lobe (series 4, image 44). No pleural effusion or pneumothorax. Musculoskeletal: No chest wall abnormality. No acute osseous findings. CT ABDOMEN PELVIS FINDINGS Hepatobiliary: No solid liver abnormality is  seen. Small gallstones. No gallbladder wall thickening, or biliary dilatation. Pancreas: Unremarkable. No pancreatic ductal dilatation or surrounding inflammatory changes. Spleen: Normal in size without significant abnormality. Adrenals/Urinary Tract: Unchanged, definitively benign bilateral adrenal adenomata and left adrenal myelolipoma, for which no further follow-up or characterization is required. Kidneys are normal, without renal calculi, solid lesion, or hydronephrosis. Bladder is unremarkable. Stomach/Bowel: Stomach is within normal limits. Status post inferior gastric resection and reanastomosis. Interval enlargement of a low-attenuation soft tissue nodule about the resection margin, previously with FDG avidity although difficult to distinctly measure on prior noncontrast PET-CT, on today's examination measuring 2.9 x 2.5 cm (series 2, image 64). Status post proximal small bowel resection and reanastomosis. Interval enlargement of a near fluid attenuation collection adjacent to the anastomosis, previously with rim FDG avidity, measuring 2.8 x 2.5 cm (series 2, image 79). Status post right hemicolectomy and ileocolic anastomosis. Descending and sigmoid diverticulosis. Diminished size of a soft tissue nodule in the ventral left hemiabdomen adjacent to loops of mid small bowel, previously with rim FDG avidity,  measuring 3.1 x 2.9 cm, previously 4.0 x 3.7 cm when measured similarly (series 2, image 90). Vascular/Lymphatic: Aortic atherosclerosis. No enlarged abdominal or pelvic lymph nodes. Reproductive: No mass or other abnormality. Other: Ventral mesh hernia repair.  No ascites. Musculoskeletal: No acute osseous findings. IMPRESSION: 1. Status post inferior gastric resection and reanastomosis. Interval enlargement of a low-attenuation soft tissue nodule about the resection margin, previously with FDG avidity although difficult to distinctly measure on prior noncontrast PET-CT, on today's examination measuring 2.9 x 2.5 cm. 2. Status post proximal small bowel resection and reanastomosis. Interval enlargement of a near fluid attenuation collection adjacent to the anastomosis, previously with rim FDG avidity. 3. Diminished size of a soft tissue nodule in the ventral left hemiabdomen adjacent to loops of mid small bowel, previously with rim FDG avidity. 4. No convincing evidence of lymphadenopathy or metastatic disease in the chest. Occasional small pulmonary nodules unchanged, measuring 0.4 cm and smaller, most likely benign and incidental. Attention on follow-up. 5. Emphysema and diffuse bilateral bronchial wall thickening. 6. Coronary artery disease. 7. Cholelithiasis. Aortic Atherosclerosis (ICD10-I70.0) and Emphysema (ICD10-J43.9). Electronically Signed   By: Jearld Lesch M.D.   On: 08/24/2023 11:37    ASSESSMENT: Stage IIIc colon cancer.  PLAN:    Stage IIIc colon cancer: Patient underwent complete surgical resection at [facility] on May 23, 2022.  Large colon mass was noted to partially invade stomach as well as surrounding mesentery.  PET scan did not reveal any metastatic disease other than a positive lung nodule which was confirmed to be a second primary.  Patient completed her final cycle of adjuvant FOLFOX on December 23, 2022.  Patient's CEA trended up to 181, but now has decreased to 122.  Her most  recent result was 131.  PET scan results from May 20, 2023 reviewed independently with clear progression of disease.  Patient was seen at [facility] and determined not to be a surgical candidate.  Repeat CT scan on August 24, 2023 reviewed independently and report as above with essentially stable disease.  Proceed with cycle 7 of treatment today.  Return to clinic in 2 days for pump removal and then in 2 weeks for further evaluation and consideration of cycle 8.  Will reimage after cycle 12.   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  Patient completed XRT in May 2023.  PET and CT scan as above with no obvious evidence of recurrence.  Repeat imaging as above. Pain: Resolved. Anemia: Hemoglobin is trended down to 8.2.  She does not require transfusion at this point, but may need 1 in the near future. Neutropenia: Resolved.  Proceed with treatment as above.  Patient may require Udenyca in the near future. Neuropathy/falls: Patient does not complain of this today.  Patient was previously given a referral to Occupational Therapy. Coping/anxiety: Patient was previously given a referral to [facility]. Incomplete bladder emptying: Patient does not complain of this today.   Patient expressed understanding and was in agreement with this plan. She also understands that She can call clinic at any time with any questions, concerns, or complaints.    Cancer Staging  Colon cancer [facility]) Staging form: Colon and Rectum, AJCC 8th Edition - Pathologic stage from 06/10/2022: Stage IIIC (pT4b, pN1c, cM0) - Signed by Jeralyn Ruths, MD on 06/10/2022 Stage prefix: Initial diagnosis Total positive nodes: 0 Histologic grading system: 4 grade system Histologic grade (G): G3   Jeralyn Ruths, MD   09/08/2023 10:10 AM

## 2023-09-08 NOTE — Patient Instructions (Signed)
[place] [facility]  Discharge Instructions: Thank you for choosing [facility] to provide your oncology and hematology care.  If you have a lab appointment with the Cancer Center, please go directly to the Cancer Center and check in at the registration area.  Wear comfortable clothing and clothing appropriate for easy access to any Portacath or PICC line.   We strive to give you quality time with your provider. You may need to reschedule your appointment if you arrive late (15 or more minutes).  Arriving late affects you and other patients whose appointments are after yours.  Also, if you miss three or more appointments without notifying the office, you may be dismissed from the clinic at the provider's discretion.      For prescription refill requests, have your pharmacy contact our office and allow 72 hours for refills to be completed.    Today you received the following chemotherapy and/or immunotherapy agents- bevacizumab, leucovorin, irinotecan, 5FU      To help prevent nausea and vomiting after your treatment, we encourage you to take your nausea medication as directed.  BELOW ARE SYMPTOMS THAT SHOULD BE REPORTED IMMEDIATELY: *FEVER GREATER THAN 100.4 F (38 C) OR HIGHER *CHILLS OR SWEATING *NAUSEA AND VOMITING THAT IS NOT CONTROLLED WITH YOUR NAUSEA MEDICATION *UNUSUAL SHORTNESS OF BREATH *UNUSUAL BRUISING OR BLEEDING *URINARY PROBLEMS (pain or burning when urinating, or frequent urination) *BOWEL PROBLEMS (unusual diarrhea, constipation, pain near the anus) TENDERNESS IN MOUTH AND THROAT WITH OR WITHOUT PRESENCE OF ULCERS (sore throat, sores in mouth, or a toothache) UNUSUAL RASH, SWELLING OR PAIN  UNUSUAL VAGINAL DISCHARGE OR ITCHING   Items with * indicate a potential emergency and should be followed up as soon as possible or go to the Emergency Department if any problems should occur.  Please show the CHEMOTHERAPY ALERT CARD or  IMMUNOTHERAPY ALERT CARD at check-in to the Emergency Department and triage nurse.  Should you have questions after your visit or need to cancel or reschedule your appointment, please contact [place] [facility]  [phone] and follow the prompts.  Office hours are 8:00 a.m. to 4:30 p.m. Monday - Friday. Please note that voicemails left after 4:00 p.m. may not be returned until the following business day.  We are closed weekends and major holidays. You have access to a nurse at all times for urgent questions. Please call the main number to the clinic [phone] and follow the prompts.  For any non-urgent questions, you may also contact your provider using MyChart. We now offer e-Visits for anyone 67 and older to request care online for non-urgent symptoms. For details visit [email or web address].   Also download the MyChart app! Go to the app store, search "MyChart", open the app, select [facility], and log in with your MyChart username and password.

## 2023-09-10 ENCOUNTER — Inpatient Hospital Stay: Payer: Medicare Other

## 2023-09-10 VITALS — BP 138/74 | HR 90 | Resp 18

## 2023-09-10 DIAGNOSIS — Z5111 Encounter for antineoplastic chemotherapy: Secondary | ICD-10-CM | POA: Diagnosis not present

## 2023-09-10 DIAGNOSIS — C184 Malignant neoplasm of transverse colon: Secondary | ICD-10-CM

## 2023-09-10 MED ORDER — HEPARIN SOD (PORK) LOCK FLUSH 100 UNIT/ML IV SOLN
500.0000 [IU] | Freq: Once | INTRAVENOUS | Status: AC | PRN
  Administered 2023-09-10: 500 [IU]
  Filled 2023-09-10: qty 5

## 2023-09-10 MED ORDER — SODIUM CHLORIDE 0.9% FLUSH
10.0000 mL | INTRAVENOUS | Status: DC | PRN
  Administered 2023-09-10: 10 mL
  Filled 2023-09-10: qty 10

## 2023-09-15 ENCOUNTER — Other Ambulatory Visit: Payer: Medicare Other

## 2023-09-15 ENCOUNTER — Ambulatory Visit: Payer: Medicare Other

## 2023-09-15 ENCOUNTER — Ambulatory Visit: Payer: Medicare Other | Admitting: Oncology

## 2023-09-22 ENCOUNTER — Inpatient Hospital Stay: Payer: Medicare Other | Admitting: Oncology

## 2023-09-22 ENCOUNTER — Other Ambulatory Visit: Payer: Self-pay | Admitting: Oncology

## 2023-09-22 ENCOUNTER — Inpatient Hospital Stay: Payer: Medicare Other

## 2023-09-22 ENCOUNTER — Telehealth: Payer: Self-pay | Admitting: Oncology

## 2023-09-22 DIAGNOSIS — C184 Malignant neoplasm of transverse colon: Secondary | ICD-10-CM

## 2023-09-22 NOTE — Telephone Encounter (Signed)
Alfredo Bach (Ashantia's husband) called and left msg with our answering service. Terasha is not feeling up to chemo today and wants a week break. Cancel this week's appts and call to  reschedule.

## 2023-09-24 ENCOUNTER — Ambulatory Visit: Payer: Medicare Other

## 2023-09-29 ENCOUNTER — Ambulatory Visit: Payer: Medicare Other

## 2023-09-29 ENCOUNTER — Other Ambulatory Visit: Payer: Medicare Other

## 2023-09-29 ENCOUNTER — Ambulatory Visit: Payer: Medicare Other | Admitting: Oncology

## 2023-09-30 ENCOUNTER — Encounter: Payer: Self-pay | Admitting: Oncology

## 2023-09-30 ENCOUNTER — Inpatient Hospital Stay: Payer: Medicare Other

## 2023-09-30 ENCOUNTER — Inpatient Hospital Stay (HOSPITAL_BASED_OUTPATIENT_CLINIC_OR_DEPARTMENT_OTHER): Payer: Medicare Other | Admitting: Oncology

## 2023-09-30 VITALS — BP 136/69 | HR 90 | Resp 18

## 2023-09-30 DIAGNOSIS — C184 Malignant neoplasm of transverse colon: Secondary | ICD-10-CM

## 2023-09-30 DIAGNOSIS — Z5111 Encounter for antineoplastic chemotherapy: Secondary | ICD-10-CM | POA: Diagnosis not present

## 2023-09-30 LAB — CMP (CANCER CENTER ONLY)
ALT: 9 U/L (ref 0–44)
AST: 14 U/L — ABNORMAL LOW (ref 15–41)
Albumin: 3.8 g/dL (ref 3.5–5.0)
Alkaline Phosphatase: 45 U/L (ref 38–126)
Anion gap: 8 (ref 5–15)
BUN: 24 mg/dL — ABNORMAL HIGH (ref 8–23)
CO2: 27 mmol/L (ref 22–32)
Calcium: 9.4 mg/dL (ref 8.9–10.3)
Chloride: 104 mmol/L (ref 98–111)
Creatinine: 1.02 mg/dL — ABNORMAL HIGH (ref 0.44–1.00)
GFR, Estimated: 58 mL/min — ABNORMAL LOW (ref >60)
Glucose, Bld: 102 mg/dL — ABNORMAL HIGH (ref 70–99)
Potassium: 3.9 mmol/L (ref 3.5–5.1)
Sodium: 139 mmol/L (ref 135–145)
Total Bilirubin: 0.8 mg/dL (ref 0.3–1.2)
Total Protein: 6.9 g/dL (ref 6.5–8.1)

## 2023-09-30 LAB — CBC WITH DIFFERENTIAL (CANCER CENTER ONLY)
Abs Immature Granulocytes: 0.07 10*3/uL (ref 0.00–0.07)
Basophils Absolute: 0.1 10*3/uL (ref 0.0–0.1)
Basophils Relative: 1 %
Eosinophils Absolute: 0.2 10*3/uL (ref 0.0–0.5)
Eosinophils Relative: 2 %
HCT: 34.7 % — ABNORMAL LOW (ref 36.0–46.0)
Hemoglobin: 10.7 g/dL — ABNORMAL LOW (ref 12.0–15.0)
Immature Granulocytes: 1 %
Lymphocytes Relative: 12 %
Lymphs Abs: 1.1 10*3/uL (ref 0.7–4.0)
MCH: 28.8 pg (ref 26.0–34.0)
MCHC: 30.8 g/dL (ref 30.0–36.0)
MCV: 93.3 fL (ref 80.0–100.0)
Monocytes Absolute: 1 10*3/uL (ref 0.1–1.0)
Monocytes Relative: 11 %
Neutro Abs: 6.5 10*3/uL (ref 1.7–7.7)
Neutrophils Relative %: 73 %
Platelet Count: 214 10*3/uL (ref 150–400)
RBC: 3.72 MIL/uL — ABNORMAL LOW (ref 3.87–5.11)
RDW: 18.6 % — ABNORMAL HIGH (ref 11.5–15.5)
WBC Count: 8.9 10*3/uL (ref 4.0–10.5)
nRBC: 0 % (ref 0.0–0.2)

## 2023-09-30 MED ORDER — SODIUM CHLORIDE 0.9 % IV SOLN
400.0000 mg/m2 | Freq: Once | INTRAVENOUS | Status: AC
  Administered 2023-09-30: 652 mg via INTRAVENOUS
  Filled 2023-09-30: qty 32.6

## 2023-09-30 MED ORDER — ATROPINE SULFATE 1 MG/ML IV SOLN
0.5000 mg | Freq: Once | INTRAVENOUS | Status: AC | PRN
  Administered 2023-09-30: 0.5 mg via INTRAVENOUS
  Filled 2023-09-30: qty 1

## 2023-09-30 MED ORDER — FLUOROURACIL CHEMO INJECTION 2.5 GM/50ML
400.0000 mg/m2 | Freq: Once | INTRAVENOUS | Status: AC
  Administered 2023-09-30: 650 mg via INTRAVENOUS
  Filled 2023-09-30: qty 13

## 2023-09-30 MED ORDER — SODIUM CHLORIDE 0.9 % IV SOLN
2400.0000 mg/m2 | INTRAVENOUS | Status: DC
  Administered 2023-09-30: 3900 mg via INTRAVENOUS
  Filled 2023-09-30: qty 78

## 2023-09-30 MED ORDER — DEXAMETHASONE SODIUM PHOSPHATE 10 MG/ML IJ SOLN
10.0000 mg | Freq: Once | INTRAMUSCULAR | Status: AC
Start: 2023-09-30 — End: 2023-09-30
  Administered 2023-09-30: 10 mg via INTRAVENOUS
  Filled 2023-09-30: qty 1

## 2023-09-30 MED ORDER — SODIUM CHLORIDE 0.9 % IV SOLN
Freq: Once | INTRAVENOUS | Status: AC
  Filled 2023-09-30: qty 250

## 2023-09-30 MED ORDER — MEGESTROL ACETATE 40 MG PO TABS: 40.0000 mg | ORAL_TABLET | Freq: Every day | ORAL | 0 refills | Status: DC

## 2023-09-30 MED ORDER — SODIUM CHLORIDE 0.9 % IV SOLN
180.0000 mg/m2 | Freq: Once | INTRAVENOUS | Status: AC
Start: 2023-09-30 — End: 2023-09-30
  Administered 2023-09-30: 300 mg via INTRAVENOUS
  Filled 2023-09-30: qty 15

## 2023-09-30 MED ORDER — PALONOSETRON HCL INJECTION 0.25 MG/5ML
0.2500 mg | Freq: Once | INTRAVENOUS | Status: AC
  Administered 2023-09-30: 0.25 mg via INTRAVENOUS
  Filled 2023-09-30: qty 5

## 2023-09-30 MED ORDER — SODIUM CHLORIDE 0.9 % IV SOLN
5.0000 mg/kg | Freq: Once | INTRAVENOUS | Status: AC
Start: 2023-09-30 — End: 2023-09-30
  Administered 2023-09-30: 300 mg via INTRAVENOUS
  Filled 2023-09-30: qty 12

## 2023-09-30 NOTE — Progress Notes (Signed)
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  INTERVAL HISTORY: Patient returns to clinic today for further evaluation and consideration of cycle 8 of FOLFIRI plus Avastin.  Her performance status is declining and she has worsening weakness and fatigue.  She has a poor appetite.  She has a chronic peripheral neuropathy, but no other neurologic complaints.  She denies any recent fevers or illnesses.  She has no chest pain, shortness of breath, cough, or hemoptysis.  She has no abdominal pain.  She denies any nausea, vomiting, constipation, or diarrhea.  She has no melena or hematochezia.  She has no urinary complaints.  Patient has no further specific complaints today.  REVIEW OF SYSTEMS:   Review of Systems  Constitutional:  Positive for malaise/fatigue. Negative for fever and weight loss.  Respiratory: Negative.  Negative for cough, hemoptysis and shortness of breath.   Cardiovascular: Negative.  Negative for chest pain and leg swelling.  Gastrointestinal: Negative.  Negative for abdominal pain, blood in stool, constipation, diarrhea, melena, nausea and vomiting.  Genitourinary: Negative.  Negative for dysuria.  Musculoskeletal: Negative.  Negative for back pain and falls.  Skin: Negative.  Negative for rash.  Neurological:  Positive for sensory change and weakness. Negative for dizziness, focal weakness and headaches.  Psychiatric/Behavioral: Negative.  The patient is not nervous/anxious.     As per HPI.  Otherwise, a complete review of systems is negative.  PAST MEDICAL HISTORY: Past Medical History:  Diagnosis Date   Anemia    Anxiety    Asthma    Cancer (HCC)    colon   CHF (congestive heart failure) (HCC)    COPD (chronic obstructive pulmonary disease) (HCC)    Diabetes mellitus without complication (HCC)    Hyperlipidemia    Hypertension    Stroke [facility])     PAST SURGICAL HISTORY: Past Surgical History:  Procedure Laterality Date   BIOPSY  06/26/2023   Procedure: BIOPSY;  Surgeon: Regis Bill, MD;  Location: [facility] ENDOSCOPY;  Service: Endoscopy;;   COLONOSCOPY N/A 06/26/2023   Procedure: COLONOSCOPY;  Surgeon: Regis Bill, MD;  Location: [facility] ENDOSCOPY;  Service: Endoscopy;  Laterality: N/A;   COLONOSCOPY WITH PROPOFOL N/A 02/03/2022   Procedure: COLONOSCOPY WITH PROPOFOL;  Surgeon: Toney Reil, MD;  Location: [facility] ENDOSCOPY;  Service: Gastroenterology;  Laterality: N/A;   ESOPHAGOGASTRODUODENOSCOPY N/A 06/26/2023   Procedure: ESOPHAGOGASTRODUODENOSCOPY (EGD);  Surgeon: Regis Bill, MD;  Location: [facility] ENDOSCOPY;  Service: Endoscopy;  Laterality: N/A;   ESOPHAGOGASTRODUODENOSCOPY (EGD) WITH PROPOFOL N/A 02/03/2022   Procedure: ESOPHAGOGASTRODUODENOSCOPY (EGD) WITH PROPOFOL;  Surgeon: Toney Reil, MD;  Location: [facility] ENDOSCOPY;  Service: Gastroenterology;  Laterality: N/A;   GIVENS CAPSULE STUDY N/A 02/03/2022   Procedure: GIVENS CAPSULE STUDY;  Surgeon: Toney Reil, MD;  Location: [facility] ENDOSCOPY;  Service: Gastroenterology;  Laterality: N/A;  possible Capsule Study   IR IMAGING GUIDED PORT INSERTION  06/18/2022   POLYPECTOMY  06/26/2023   Procedure: POLYPECTOMY;  Surgeon: Regis Bill, MD;  Location: [facility] ENDOSCOPY;  Service: Endoscopy;;    FAMILY HISTORY: Family History  Problem Relation Age of Onset   Diabetes Mother    Heart attack Father    Colon cancer Father     ADVANCED DIRECTIVES (Y/N):  N  HEALTH  MAINTENANCE: Social History   Tobacco Use   Smoking status: Former    Types: E-cigarettes    Quit date: 12/08/2022    Years since quitting: 0.8   Tobacco comments:    Quit Cigarettes 12/2021  Vaping Use   Vaping status: Every Day   Substances: Nicotine, THC  Substance Use Topics   Alcohol use: Yes    Comment: rare   Drug use: Yes    Types: Marijuana     Colonoscopy:  PAP:  Bone density:  Lipid panel:  Allergies  Allergen Reactions   Codeine Rash    Happened in 1970s    Current Outpatient Medications  Medication Sig Dispense Refill   acetaminophen (TYLENOL) 325 MG tablet Take 650 mg by mouth every 6 (six) hours as needed.     albuterol (VENTOLIN HFA) 108 (90 Base) MCG/ACT inhaler Inhale into the lungs.     atorvastatin (LIPITOR) 40 MG tablet Take 40 mg by mouth daily.     clonazePAM (KLONOPIN) 1 MG tablet Take 1 mg by mouth 2 (two) times daily.     fenofibrate 54 MG tablet Take 54 mg by mouth daily.     gabapentin (NEURONTIN) 600 MG tablet Take by mouth 2 (two) times daily.     LANTUS SOLOSTAR 100 UNIT/ML Solostar Pen Inject 12 Units into the skin at bedtime.     lisinopril (ZESTRIL) 20 MG tablet Take 20 mg by mouth daily.     Magnesium Citrate 200 MG TABS Take 1 tablet by mouth in the morning and at bedtime. 250 twice daily     megestrol (MEGACE) 40 MG tablet Take 1 tablet (40 mg total) by mouth daily. 30 tablet 0   metoprolol succinate (TOPROL-XL) 50 MG 24 hr tablet Take 50 mg by mouth daily. Take with or immediately following a meal.     OLANZapine (ZYPREXA) 10 MG tablet Take 0.5-1 tablets (5-10 mg total) by mouth at bedtime as needed (nausea). 30 tablet 1   ranolazine (RANEXA) 500 MG 12 hr tablet Take 500 mg by mouth 2 (two) times daily.     sitaGLIPtin-metformin (JANUMET) 50-1000 MG tablet Take 1 tablet by mouth daily.     venlafaxine XR (EFFEXOR-XR) 75 MG 24 hr capsule Take 75 mg by mouth daily.     insulin lispro (HUMALOG) 100 UNIT/ML injection Inject 1-10 Units  into the skin 3 (three) times daily before meals. Per sliding scale (Patient not taking: Reported on 08/25/2023)     pantoprazole (PROTONIX) 40 MG tablet Take 1 tablet (40 mg total) by mouth daily. 30 tablet 5   No current facility-administered medications for this visit.   Facility-Administered Medications Ordered in Other Visits  Medication Dose Route Frequency Provider Last Rate Last Admin   atropine injection 0.5 mg  0.5 mg Intravenous Once PRN [name], Tollie Pizza, MD       bevacizumab-adcd (VEGZELMA) 300 mg in sodium chloride 0.9 % 100 mL chemo infusion  5 mg/kg (Treatment Plan Recorded) Intravenous Once Jeralyn Ruths, MD 672 mL/hr at 09/30/23 1222 300 mg at 09/30/23 1222   fluorouracil (ADRUCIL) 3,900 mg in sodium chloride 0.9 % 72 mL chemo infusion  2,400 mg/m2 (Treatment Plan Recorded) Intravenous 1 day or 1 dose Jeralyn Ruths, MD       fluorouracil (ADRUCIL) chemo  injection 650 mg  400 mg/m2 (Treatment Plan Recorded) Intravenous Once Jeralyn Ruths, MD       irinotecan (CAMPTOSAR) 300 mg in sodium chloride 0.9 % 500 mL chemo infusion  180 mg/m2 (Treatment Plan Recorded) Intravenous Once Jeralyn Ruths, MD       leucovorin 652 mg in sodium chloride 0.9 % 250 mL infusion  400 mg/m2 (Treatment Plan Recorded) Intravenous Once Jeralyn Ruths, MD        OBJECTIVE: Vitals:   09/30/23 1016  BP: 116/69  Pulse: (!) 108  Resp: 16  Temp: 99.5 F (37.5 C)  SpO2: 98%        Body mass index is 22.14 kg/m.    ECOG FS:2 - Symptomatic, <50% confined to bed  General: Well-developed, well-nourished, no acute distress. Eyes: Pink conjunctiva, anicteric sclera. HEENT: Normocephalic, moist mucous membranes. Lungs: No audible wheezing or coughing. Heart: Regular rate and rhythm. Abdomen: Soft, nontender, no obvious distention. Musculoskeletal: No edema, cyanosis, or clubbing. Neuro: Alert, answering all questions appropriately. Cranial nerves grossly intact. Skin: No  rashes or petechiae noted. Psych: Normal affect.  LAB RESULTS:  Lab Results  Component Value Date   NA 139 09/30/2023   K 3.9 09/30/2023   CL 104 09/30/2023   CO2 27 09/30/2023   GLUCOSE 102 (H) 09/30/2023   BUN 24 (H) 09/30/2023   CREATININE 1.02 (H) 09/30/2023   CALCIUM 9.4 09/30/2023   PROT 6.9 09/30/2023   ALBUMIN 3.8 09/30/2023   AST 14 (L) 09/30/2023   ALT 9 09/30/2023   ALKPHOS 45 09/30/2023   BILITOT 0.8 09/30/2023   GFRNONAA 58 (L) 09/30/2023    Lab Results  Component Value Date   WBC 8.9 09/30/2023   NEUTROABS 6.5 09/30/2023   HGB 10.7 (L) 09/30/2023   HCT 34.7 (L) 09/30/2023   MCV 93.3 09/30/2023   PLT 214 09/30/2023     STUDIES: No results found.  ASSESSMENT: Stage IIIc colon cancer.  PLAN:    Stage IIIc colon cancer: Patient underwent complete surgical resection at [facility] on May 23, 2022.  Large colon mass was noted to partially invade stomach as well as surrounding mesentery.  PET scan did not reveal any metastatic disease other than a positive lung nodule which was confirmed to be a second primary.  Patient completed her final cycle of adjuvant FOLFOX on December 23, 2022.  Patient's CEA trended up to 181, but now has decreased to 122.  Her most recent result was 131.  PET scan results from May 20, 2023 reviewed independently with clear progression of disease.  Patient was seen at [facility] and determined not to be a surgical candidate.  Repeat CT scan on August 24, 2023 reviewed independently with essentially stable disease.  Proceed with cycle 8 of treatment today.  Given patient's declining performance status, will switch treatment every 21 days.  Return to clinic in 2 days for pump removal and then in 3 weeks for further evaluation and consideration of cycle 9.  Will reimage after cycle 12.   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  Patient completed XRT in May 2023.   PET and CT scan as above with no obvious evidence of recurrence.  Repeat imaging as above. Pain: Resolved. Anemia: Hemoglobin improved to 10.7.  Monitor. Neutropenia: Resolved.  Proceed with treatment as above.  Patient may require Udenyca in the near future. Neuropathy/falls: Patient does not complain  of this today.  Patient was previously given a referral to Occupational Therapy. Coping/anxiety: Patient was previously given a referral to [facility]. Incomplete bladder emptying: Patient does not complain of this today. Poor appetite: Patient was given a prescription for Megace.  Consider dietary referral in the near future. Weakness and fatigue: Change treatment cycle to 21 days as above.  Patient expressed understanding and was in agreement with this plan. She also understands that She can call clinic at any time with any questions, concerns, or complaints.    Cancer Staging  Colon cancer [facility]) Staging form: Colon and Rectum, AJCC 8th Edition - Pathologic stage from 06/10/2022: Stage IIIC (pT4b, pN1c, cM0) - Signed by Jeralyn Ruths, MD on 06/10/2022 Stage prefix: Initial diagnosis Total positive nodes: 0 Histologic grading system: 4 grade system Histologic grade (G): G3   Jeralyn Ruths, MD   09/30/2023 12:22 PM

## 2023-09-30 NOTE — Progress Notes (Signed)
Sleeping a lot during the day. Having nausea. Having neuropathy in hands. Having weakness and a lot of unsteadiness

## 2023-09-30 NOTE — Patient Instructions (Signed)
[place] [facility]  Discharge Instructions: Thank you for choosing [facility] to provide your oncology and hematology care.  If you have a lab appointment with the Cancer Center, please go directly to the Cancer Center and check in at the registration area.  Wear comfortable clothing and clothing appropriate for easy access to any Portacath or PICC line.   We strive to give you quality time with your provider. You may need to reschedule your appointment if you arrive late (15 or more minutes).  Arriving late affects you and other patients whose appointments are after yours.  Also, if you miss three or more appointments without notifying the office, you may be dismissed from the clinic at the provider's discretion.      For prescription refill requests, have your pharmacy contact our office and allow 72 hours for refills to be completed.    Today you received the following chemotherapy and/or immunotherapy agents Bevacizumab, Irinotecan, Leucovorin, 5FU injection and pump.      To help prevent nausea and vomiting after your treatment, we encourage you to take your nausea medication as directed.  BELOW ARE SYMPTOMS THAT SHOULD BE REPORTED IMMEDIATELY: *FEVER GREATER THAN 100.4 F (38 C) OR HIGHER *CHILLS OR SWEATING *NAUSEA AND VOMITING THAT IS NOT CONTROLLED WITH YOUR NAUSEA MEDICATION *UNUSUAL SHORTNESS OF BREATH *UNUSUAL BRUISING OR BLEEDING *URINARY PROBLEMS (pain or burning when urinating, or frequent urination) *BOWEL PROBLEMS (unusual diarrhea, constipation, pain near the anus) TENDERNESS IN MOUTH AND THROAT WITH OR WITHOUT PRESENCE OF ULCERS (sore throat, sores in mouth, or a toothache) UNUSUAL RASH, SWELLING OR PAIN  UNUSUAL VAGINAL DISCHARGE OR ITCHING   Items with * indicate a potential emergency and should be followed up as soon as possible or go to the Emergency Department if any problems should occur.  Please show the CHEMOTHERAPY  ALERT CARD or IMMUNOTHERAPY ALERT CARD at check-in to the Emergency Department and triage nurse.  Should you have questions after your visit or need to cancel or reschedule your appointment, please contact [place] [facility]  [phone] and follow the prompts.  Office hours are 8:00 a.m. to 4:30 p.m. Monday - Friday. Please note that voicemails left after 4:00 p.m. may not be returned until the following business day.  We are closed weekends and major holidays. You have access to a nurse at all times for urgent questions. Please call the main number to the clinic [phone] and follow the prompts.  For any non-urgent questions, you may also contact your provider using MyChart. We now offer e-Visits for anyone 32 and older to request care online for non-urgent symptoms. For details visit [email or web address].   Also download the MyChart app! Go to the app store, search "MyChart", open the app, select [place], and log in with your MyChart username and password.

## 2023-10-02 ENCOUNTER — Inpatient Hospital Stay: Payer: Medicare Other | Attending: Oncology

## 2023-10-02 VITALS — BP 133/65 | HR 88 | Resp 18

## 2023-10-02 DIAGNOSIS — C3432 Malignant neoplasm of lower lobe, left bronchus or lung: Secondary | ICD-10-CM | POA: Insufficient documentation

## 2023-10-02 DIAGNOSIS — C7889 Secondary malignant neoplasm of other digestive organs: Secondary | ICD-10-CM | POA: Diagnosis not present

## 2023-10-02 DIAGNOSIS — D649 Anemia, unspecified: Secondary | ICD-10-CM | POA: Insufficient documentation

## 2023-10-02 DIAGNOSIS — C189 Malignant neoplasm of colon, unspecified: Secondary | ICD-10-CM | POA: Insufficient documentation

## 2023-10-02 DIAGNOSIS — Z5111 Encounter for antineoplastic chemotherapy: Secondary | ICD-10-CM | POA: Diagnosis present

## 2023-10-02 DIAGNOSIS — C184 Malignant neoplasm of transverse colon: Secondary | ICD-10-CM

## 2023-10-02 MED ORDER — SODIUM CHLORIDE 0.9% FLUSH
10.0000 mL | INTRAVENOUS | Status: DC | PRN
  Administered 2023-10-02: 10 mL
  Filled 2023-10-02: qty 10

## 2023-10-02 MED ORDER — HEPARIN SOD (PORK) LOCK FLUSH 100 UNIT/ML IV SOLN
500.0000 [IU] | Freq: Once | INTRAVENOUS | Status: AC | PRN
  Administered 2023-10-02: 500 [IU]
  Filled 2023-10-02: qty 5

## 2023-10-06 ENCOUNTER — Other Ambulatory Visit: Payer: Medicare Other

## 2023-10-06 ENCOUNTER — Ambulatory Visit: Payer: Medicare Other | Admitting: Nurse Practitioner

## 2023-10-06 ENCOUNTER — Ambulatory Visit: Payer: Medicare Other

## 2023-10-09 ENCOUNTER — Encounter: Payer: Self-pay | Admitting: Oncology

## 2023-10-09 NOTE — Progress Notes (Signed)
[facility]  This patient declined to enroll in [facility]. Our program remains available for this patient should she express interest in the future.

## 2023-10-13 ENCOUNTER — Ambulatory Visit: Payer: Medicare Other | Admitting: Oncology

## 2023-10-13 ENCOUNTER — Other Ambulatory Visit: Payer: Medicare Other

## 2023-10-13 ENCOUNTER — Ambulatory Visit: Payer: Medicare Other

## 2023-10-14 ENCOUNTER — Encounter: Payer: Self-pay | Admitting: Oncology

## 2023-10-20 ENCOUNTER — Ambulatory Visit: Payer: Medicare Other | Admitting: Oncology

## 2023-10-20 ENCOUNTER — Other Ambulatory Visit: Payer: Medicare Other

## 2023-10-20 ENCOUNTER — Ambulatory Visit: Payer: Medicare Other

## 2023-10-21 ENCOUNTER — Inpatient Hospital Stay: Payer: Medicare Other

## 2023-10-21 ENCOUNTER — Inpatient Hospital Stay (HOSPITAL_BASED_OUTPATIENT_CLINIC_OR_DEPARTMENT_OTHER): Payer: Medicare Other | Admitting: Oncology

## 2023-10-21 VITALS — BP 134/57 | HR 93 | Temp 98.0°F | Wt 136.0 lb

## 2023-10-21 VITALS — BP 152/73 | HR 80 | Temp 98.5°F | Resp 18

## 2023-10-21 DIAGNOSIS — Z5111 Encounter for antineoplastic chemotherapy: Secondary | ICD-10-CM | POA: Diagnosis not present

## 2023-10-21 DIAGNOSIS — C184 Malignant neoplasm of transverse colon: Secondary | ICD-10-CM

## 2023-10-21 LAB — CMP (CANCER CENTER ONLY)
ALT: 8 U/L (ref 0–44)
AST: 10 U/L — ABNORMAL LOW (ref 15–41)
Albumin: 3.5 g/dL (ref 3.5–5.0)
Alkaline Phosphatase: 41 U/L (ref 38–126)
Anion gap: 9 (ref 5–15)
BUN: 29 mg/dL — ABNORMAL HIGH (ref 8–23)
CO2: 24 mmol/L (ref 22–32)
Calcium: 9.2 mg/dL (ref 8.9–10.3)
Chloride: 107 mmol/L (ref 98–111)
Creatinine: 1.14 mg/dL — ABNORMAL HIGH (ref 0.44–1.00)
GFR, Estimated: 51 mL/min — ABNORMAL LOW (ref >60)
Glucose, Bld: 148 mg/dL — ABNORMAL HIGH (ref 70–99)
Potassium: 4.2 mmol/L (ref 3.5–5.1)
Sodium: 140 mmol/L (ref 135–145)
Total Bilirubin: 0.3 mg/dL (ref <1.2)
Total Protein: 6.3 g/dL — ABNORMAL LOW (ref 6.5–8.1)

## 2023-10-21 LAB — CBC WITH DIFFERENTIAL (CANCER CENTER ONLY)
Abs Immature Granulocytes: 0.02 10*3/uL (ref 0.00–0.07)
Basophils Absolute: 0 10*3/uL (ref 0.0–0.1)
Basophils Relative: 1 %
Eosinophils Absolute: 0.2 10*3/uL (ref 0.0–0.5)
Eosinophils Relative: 4 %
HCT: 29 % — ABNORMAL LOW (ref 36.0–46.0)
Hemoglobin: 9.1 g/dL — ABNORMAL LOW (ref 12.0–15.0)
Immature Granulocytes: 1 %
Lymphocytes Relative: 22 %
Lymphs Abs: 1 10*3/uL (ref 0.7–4.0)
MCH: 28.9 pg (ref 26.0–34.0)
MCHC: 31.4 g/dL (ref 30.0–36.0)
MCV: 92.1 fL (ref 80.0–100.0)
Monocytes Absolute: 0.5 10*3/uL (ref 0.1–1.0)
Monocytes Relative: 12 %
Neutro Abs: 2.7 10*3/uL (ref 1.7–7.7)
Neutrophils Relative %: 60 %
Platelet Count: 170 10*3/uL (ref 150–400)
RBC: 3.15 MIL/uL — ABNORMAL LOW (ref 3.87–5.11)
RDW: 17.8 % — ABNORMAL HIGH (ref 11.5–15.5)
WBC Count: 4.4 10*3/uL (ref 4.0–10.5)
nRBC: 0 % (ref 0.0–0.2)

## 2023-10-21 MED ORDER — SODIUM CHLORIDE 0.9 % IV SOLN
180.0000 mg/m2 | Freq: Once | INTRAVENOUS | Status: AC
  Administered 2023-10-21: 300 mg via INTRAVENOUS
  Filled 2023-10-21: qty 15

## 2023-10-21 MED ORDER — HEPARIN SOD (PORK) LOCK FLUSH 100 UNIT/ML IV SOLN
500.0000 [IU] | Freq: Once | INTRAVENOUS | Status: DC | PRN
  Filled 2023-10-21: qty 5

## 2023-10-21 MED ORDER — SODIUM CHLORIDE 0.9 % IV SOLN
Freq: Once | INTRAVENOUS | Status: AC
  Filled 2023-10-21: qty 250

## 2023-10-21 MED ORDER — PALONOSETRON HCL INJECTION 0.25 MG/5ML
0.2500 mg | Freq: Once | INTRAVENOUS | Status: AC
Start: 2023-10-21 — End: 2023-10-21
  Administered 2023-10-21: 0.25 mg via INTRAVENOUS
  Filled 2023-10-21: qty 5

## 2023-10-21 MED ORDER — SODIUM CHLORIDE 0.9 % IV SOLN
400.0000 mg/m2 | Freq: Once | INTRAVENOUS | Status: AC
  Administered 2023-10-21: 652 mg via INTRAVENOUS
  Filled 2023-10-21: qty 32.6

## 2023-10-21 MED ORDER — FLUOROURACIL CHEMO INJECTION 2.5 GM/50ML
400.0000 mg/m2 | Freq: Once | INTRAVENOUS | Status: AC
  Administered 2023-10-21: 650 mg via INTRAVENOUS
  Filled 2023-10-21: qty 13

## 2023-10-21 MED ORDER — BEVACIZUMAB-ADCD CHEMO INJECTION 100 MG/4ML
5.0000 mg/kg | Freq: Once | INTRAVENOUS | Status: AC
  Administered 2023-10-21: 300 mg via INTRAVENOUS
  Filled 2023-10-21: qty 12

## 2023-10-21 MED ORDER — DEXAMETHASONE SODIUM PHOSPHATE 10 MG/ML IJ SOLN
10.0000 mg | Freq: Once | INTRAMUSCULAR | Status: AC
Start: 2023-10-21 — End: 2023-10-21
  Administered 2023-10-21: 10 mg via INTRAVENOUS
  Filled 2023-10-21: qty 1

## 2023-10-21 MED ORDER — FLUOROURACIL CHEMO INJECTION 5 GM/100ML
2400.0000 mg/m2 | INTRAVENOUS | Status: DC
  Administered 2023-10-21: 3900 mg via INTRAVENOUS
  Filled 2023-10-21: qty 78

## 2023-10-21 MED ORDER — ATROPINE SULFATE 1 MG/ML IV SOLN
0.5000 mg | Freq: Once | INTRAVENOUS | Status: AC | PRN
  Administered 2023-10-21: 0.5 mg via INTRAVENOUS
  Filled 2023-10-21: qty 1

## 2023-10-21 NOTE — Patient Instructions (Signed)
[place] [facility] - A DEPT OF [facility]  Discharge Instructions: Thank you for choosing [facility] to provide your oncology and hematology care.  If you have a lab appointment with the Cancer Center, please go directly to the Cancer Center and check in at the registration area.  Wear comfortable clothing and clothing appropriate for easy access to any Portacath or PICC line.   We strive to give you quality time with your provider. You may need to reschedule your appointment if you arrive late (15 or more minutes).  Arriving late affects you and other patients whose appointments are after yours.  Also, if you miss three or more appointments without notifying the office, you may be dismissed from the clinic at the provider's discretion.      For prescription refill requests, have your pharmacy contact our office and allow 72 hours for refills to be completed.    Today you received the following chemotherapy and/or immunotherapy agents Irintotecan, Leucovorin, Vegzelma, Adrucil      To help prevent nausea and vomiting after your treatment, we encourage you to take your nausea medication as directed.  BELOW ARE SYMPTOMS THAT SHOULD BE REPORTED IMMEDIATELY: *FEVER GREATER THAN 100.4 F (38 C) OR HIGHER *CHILLS OR SWEATING *NAUSEA AND VOMITING THAT IS NOT CONTROLLED WITH YOUR NAUSEA MEDICATION *UNUSUAL SHORTNESS OF BREATH *UNUSUAL BRUISING OR BLEEDING *URINARY PROBLEMS (pain or burning when urinating, or frequent urination) *BOWEL PROBLEMS (unusual diarrhea, constipation, pain near the anus) TENDERNESS IN MOUTH AND THROAT WITH OR WITHOUT PRESENCE OF ULCERS (sore throat, sores in mouth, or a toothache) UNUSUAL RASH, SWELLING OR PAIN  UNUSUAL VAGINAL DISCHARGE OR ITCHING   Items with * indicate a potential emergency and should be followed up as soon as possible or go to the Emergency Department if any problems should occur.  Please show the  CHEMOTHERAPY ALERT CARD or IMMUNOTHERAPY ALERT CARD at check-in to the Emergency Department and triage nurse.  Should you have questions after your visit or need to cancel or reschedule your appointment, please contact [place] [facility] - A DEPT OF [facility]  [phone] and follow the prompts.  Office hours are 8:00 a.m. to 4:30 p.m. Monday - Friday. Please note that voicemails left after 4:00 p.m. may not be returned until the following business day.  We are closed weekends and major holidays. You have access to a nurse at all times for urgent questions. Please call the main number to the clinic [phone] and follow the prompts.  For any non-urgent questions, you may also contact your provider using MyChart. We now offer e-Visits for anyone 61 and older to request care online for non-urgent symptoms. For details visit [email or web address].   Also download the MyChart app! Go to the app store, search "MyChart", open the app, select [place], and log in with your MyChart username and password.

## 2023-10-21 NOTE — Progress Notes (Signed)
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  INTERVAL HISTORY: Patient returns to clinic today for further evaluation and consideration of cycle 9 of FOLFIRI plus Avastin.  Patient feels significantly improved with the extra week off between treatments.  Her appetite has also improved with Megace.  She has gained weight in the interim.  She has a chronic peripheral neuropathy, but no other neurologic complaints.  She denies any recent fevers or illnesses.  She has no chest pain, shortness of breath, cough, or hemoptysis.  She has no abdominal pain.  She denies any nausea, vomiting, constipation, or diarrhea.  She has no melena or hematochezia.  She has no urinary complaints.  Patient offers no further specific complaints today.  REVIEW OF SYSTEMS:   Review of Systems  Constitutional:  Positive for malaise/fatigue. Negative for fever and weight loss.  Respiratory: Negative.  Negative for cough, hemoptysis and shortness of breath.   Cardiovascular: Negative.  Negative for chest pain and leg swelling.  Gastrointestinal: Negative.  Negative for abdominal pain, blood in stool, constipation, diarrhea, melena, nausea and vomiting.  Genitourinary: Negative.  Negative for dysuria.  Musculoskeletal: Negative.  Negative for back pain and falls.  Skin: Negative.  Negative for rash.  Neurological:  Positive for sensory change and weakness. Negative for dizziness, focal weakness and headaches.  Psychiatric/Behavioral:  Negative.  The patient is not nervous/anxious.     As per HPI. Otherwise, a complete review of systems is negative.  PAST MEDICAL HISTORY: Past Medical History:  Diagnosis Date   Anemia    Anxiety    Asthma    Cancer (HCC)    colon   CHF (congestive heart failure) (HCC)    COPD (chronic obstructive pulmonary disease) (HCC)    Diabetes mellitus without complication (HCC)    Hyperlipidemia    Hypertension    Stroke [facility])     PAST SURGICAL HISTORY: Past Surgical History:  Procedure Laterality Date   BIOPSY  06/26/2023   Procedure: BIOPSY;  Surgeon: Regis Bill, MD;  Location: [facility] ENDOSCOPY;  Service: Endoscopy;;   COLONOSCOPY N/A 06/26/2023   Procedure: COLONOSCOPY;  Surgeon: Regis Bill, MD;  Location: [facility] ENDOSCOPY;  Service: Endoscopy;  Laterality: N/A;   COLONOSCOPY WITH PROPOFOL N/A 02/03/2022   Procedure: COLONOSCOPY WITH PROPOFOL;  Surgeon: Toney Reil, MD;  Location: [facility] ENDOSCOPY;  Service: Gastroenterology;  Laterality: N/A;   ESOPHAGOGASTRODUODENOSCOPY N/A 06/26/2023   Procedure: ESOPHAGOGASTRODUODENOSCOPY (EGD);  Surgeon: Regis Bill, MD;  Location: [facility] ENDOSCOPY;  Service: Endoscopy;  Laterality: N/A;   ESOPHAGOGASTRODUODENOSCOPY (EGD) WITH PROPOFOL N/A 02/03/2022   Procedure: ESOPHAGOGASTRODUODENOSCOPY (EGD) WITH PROPOFOL;  Surgeon: Toney Reil, MD;  Location: [facility] ENDOSCOPY;  Service: Gastroenterology;  Laterality: N/A;   GIVENS CAPSULE STUDY N/A 02/03/2022   Procedure: GIVENS CAPSULE STUDY;  Surgeon: Toney Reil, MD;  Location: [facility] ENDOSCOPY;  Service: Gastroenterology;  Laterality: N/A;  possible Capsule Study   IR IMAGING GUIDED PORT INSERTION  06/18/2022   POLYPECTOMY  06/26/2023   Procedure: POLYPECTOMY;  Surgeon: Regis Bill, MD;  Location: [facility] ENDOSCOPY;  Service: Endoscopy;;    FAMILY HISTORY: Family History  Problem Relation Age of Onset   Diabetes Mother    Heart attack Father    Colon cancer Father      ADVANCED DIRECTIVES (Y/N):  N  HEALTH MAINTENANCE: Social History   Tobacco Use   Smoking status: Former    Types: E-cigarettes    Quit date: 12/08/2022    Years since quitting: 0.8   Tobacco comments:    Quit Cigarettes 12/2021  Vaping Use   Vaping status: Every Day   Substances: Nicotine, THC  Substance Use Topics   Alcohol use: Yes    Comment: rare   Drug use: Yes    Types: Marijuana     Colonoscopy:  PAP:  Bone density:  Lipid panel:  Allergies  Allergen Reactions   Codeine Rash    Happened in 1970s    Current Outpatient Medications  Medication Sig Dispense Refill   acetaminophen (TYLENOL) 325 MG tablet Take 650 mg by mouth every 6 (six) hours as needed.     albuterol (VENTOLIN HFA) 108 (90 Base) MCG/ACT inhaler Inhale into the lungs.     atorvastatin (LIPITOR) 40 MG tablet Take 40 mg by mouth daily.     clonazePAM (KLONOPIN) 1 MG tablet Take 1 mg by mouth 2 (two) times daily.     fenofibrate 54 MG tablet Take 54 mg by mouth daily.     gabapentin (NEURONTIN) 600 MG tablet Take by mouth 2 (two) times daily.     insulin lispro (HUMALOG) 100 UNIT/ML injection Inject 1-10 Units into the skin 3 (three) times daily before meals. Per sliding scale (Patient not taking: Reported on 08/25/2023)     LANTUS SOLOSTAR 100 UNIT/ML Solostar Pen Inject 12 Units into the skin at bedtime.     lisinopril (ZESTRIL) 20 MG tablet Take 20 mg by mouth daily.     Magnesium Citrate 200 MG TABS Take 1 tablet by mouth in the morning and at bedtime. 250 twice daily     megestrol (MEGACE) 40 MG tablet Take 1 tablet (40 mg total) by mouth daily. 30 tablet 0   metoprolol succinate (TOPROL-XL) 50 MG 24 hr tablet Take 50 mg by mouth daily. Take with or immediately following a meal.     OLANZapine (ZYPREXA) 10 MG tablet Take 0.5-1 tablets (5-10 mg total) by mouth at bedtime as needed (nausea). 30 tablet 1   pantoprazole (PROTONIX) 40 MG tablet Take 1 tablet (40 mg total) by mouth daily. 30 tablet  5   ranolazine (RANEXA) 500 MG 12 hr tablet Take 500 mg by mouth 2 (two) times daily.     sitaGLIPtin-metformin (JANUMET) 50-1000 MG tablet Take 1 tablet by mouth daily.     venlafaxine XR (EFFEXOR-XR) 75 MG 24 hr capsule Take 75 mg by mouth daily.     No current facility-administered medications for this visit.   Facility-Administered Medications Ordered in Other Visits  Medication Dose Route Frequency Provider Last Rate Last Admin   bevacizumab-adcd (VEGZELMA) 300 mg in sodium chloride 0.9 % 100 mL chemo infusion  5 mg/kg (Treatment Plan Recorded) Intravenous Once Jeralyn Ruths, MD       dexamethasone (DECADRON) injection 10 mg  10 mg Intravenous Once Jeralyn Ruths, MD       fluorouracil (ADRUCIL) 3,900 mg in sodium chloride 0.9 % 72 mL chemo infusion  2,400 mg/m2 (Treatment Plan Recorded) Intravenous 1 day or 1 dose Jeralyn Ruths, MD  fluorouracil (ADRUCIL) chemo injection 650 mg  400 mg/m2 (Treatment Plan Recorded) Intravenous Once Jeralyn Ruths, MD       heparin lock flush 100 unit/mL  500 Units Intracatheter Once PRN Jeralyn Ruths, MD       irinotecan (CAMPTOSAR) 300 mg in sodium chloride 0.9 % 500 mL chemo infusion  180 mg/m2 (Treatment Plan Recorded) Intravenous Once Jeralyn Ruths, MD       leucovorin 652 mg in sodium chloride 0.9 % 250 mL infusion  400 mg/m2 (Treatment Plan Recorded) Intravenous Once Jeralyn Ruths, MD       palonosetron (ALOXI) injection 0.25 mg  0.25 mg Intravenous Once Jeralyn Ruths, MD        OBJECTIVE: Vitals:   10/21/23 0906  BP: (!) 134/57  Pulse: 93  Temp: 98 F (36.7 C)  SpO2: 98%        Body mass index is 23.34 kg/m.    ECOG FS:1 - Symptomatic but completely ambulatory  General: Well-developed, well-nourished, no acute distress.  Sitting in a wheelchair. Eyes: Pink conjunctiva, anicteric sclera. HEENT: Normocephalic, moist mucous membranes. Lungs: No audible wheezing or coughing. Heart:  Regular rate and rhythm. Abdomen: Soft, nontender, no obvious distention. Musculoskeletal: No edema, cyanosis, or clubbing. Neuro: Alert, answering all questions appropriately. Cranial nerves grossly intact. Skin: No rashes or petechiae noted. Psych: Normal affect.  LAB RESULTS:  Lab Results  Component Value Date   NA 140 10/21/2023   K 4.2 10/21/2023   CL 107 10/21/2023   CO2 24 10/21/2023   GLUCOSE 148 (H) 10/21/2023   BUN 29 (H) 10/21/2023   CREATININE 1.14 (H) 10/21/2023   CALCIUM 9.2 10/21/2023   PROT 6.3 (L) 10/21/2023   ALBUMIN 3.5 10/21/2023   AST 10 (L) 10/21/2023   ALT 8 10/21/2023   ALKPHOS 41 10/21/2023   BILITOT 0.3 10/21/2023   GFRNONAA 51 (L) 10/21/2023    Lab Results  Component Value Date   WBC 4.4 10/21/2023   NEUTROABS 2.7 10/21/2023   HGB 9.1 (L) 10/21/2023   HCT 29.0 (L) 10/21/2023   MCV 92.1 10/21/2023   PLT 170 10/21/2023     STUDIES: No results found.  ASSESSMENT: Stage IIIc colon cancer.  PLAN:    Stage IIIc colon cancer: Patient underwent complete surgical resection at [facility] on May 23, 2022.  Large colon mass was noted to partially invade stomach as well as surrounding mesentery.  PET scan did not reveal any metastatic disease other than a positive lung nodule which was confirmed to be a second primary.  Patient completed her final cycle of adjuvant FOLFOX on December 23, 2022.  Patient's CEA trended up to 181, but now has decreased to 122.  Her most recent result was 131.  PET scan results from May 20, 2023 reviewed independently with clear progression of disease.  Patient was seen at [facility] and determined not to be a surgical candidate.  Repeat CT scan on August 24, 2023 reviewed independently with essentially stable disease.  Proceed with cycle 9 of treatment today.  Continue treatments every 21 days.  Return to clinic in 2 days for pump removal and then 3 weeks for further evaluation and consideration of cycle 10.  Will  reimage after cycle 12.   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  Patient completed XRT in May 2023.  PET and CT scan as above with no obvious  evidence of recurrence.  Repeat imaging as above. Pain: Resolved. Anemia: Hemoglobin is decreased to 9.1, monitor.   Neutropenia: Resolved.  Proceed with treatment as above.   Neuropathy/falls: Patient does not complain of this today.  Patient was previously given a referral to Occupational Therapy. Coping/anxiety: Patient was previously given a referral to [facility]. Incomplete bladder emptying: Patient does not complain of this today. Poor appetite: Significantly improved with Megace. Weakness and fatigue: Improved.  Change treatment cycle to 21 days as above.  Patient expressed understanding and was in agreement with this plan. She also understands that She can call clinic at any time with any questions, concerns, or complaints.    Cancer Staging  Colon cancer [facility]) Staging form: Colon and Rectum, AJCC 8th Edition - Pathologic stage from 06/10/2022: Stage IIIC (pT4b, pN1c, cM0) - Signed by Jeralyn Ruths, MD on 06/10/2022 Stage prefix: Initial diagnosis Total positive nodes: 0 Histologic grading system: 4 grade system Histologic grade (G): G3   Jeralyn Ruths, MD   10/21/2023 9:54 AM

## 2023-10-22 ENCOUNTER — Inpatient Hospital Stay: Payer: Medicare Other | Admitting: Hospice and Palliative Medicine

## 2023-10-22 DIAGNOSIS — C184 Malignant neoplasm of transverse colon: Secondary | ICD-10-CM | POA: Diagnosis not present

## 2023-10-22 DIAGNOSIS — Z515 Encounter for palliative care: Secondary | ICD-10-CM | POA: Diagnosis not present

## 2023-10-22 NOTE — Progress Notes (Signed)
Virtual Visit via Telephone Note  I connected with Jessica Stout on 10/22/23 at  3:40 PM EST by telephone and verified that I am speaking with the correct person using two identifiers.  Location: Patient: Home Provider: Clinic   I discussed the limitations, risks, security and privacy concerns of performing an evaluation and management service by telephone and the availability of in person appointments. I also discussed with the patient that there may be a patient responsible charge related to this service. The patient expressed understanding and agreed to proceed.   History of Present Illness: Jessica Stout is a 72 y.o. 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  Palliative care was consulted to address goals.    Observations/Objective: I called and spoke with patient by phone.  She reports that she is doing reasonably well.  Denies significant changes or concerns.  Says that she has had poor appetite but was recently started on Megace by Dr. [name].  Patient says that that has helped some.  She attributes some of her poor appetite to nausea.  Discussed antiemetic regimen in detail.  Recommended the patient take olanzapine at bedtime as she reports that that has helped in the past.  Olanzapine may also help improve her appetite.  Assessment and Plan: Stage IIIc colon cancer  -patient not a surgical candidate.  Patient on FOLFIRI plus Bev.  Poor oral intake/nausea -recommend utilization of scheduled antiemetics.  Follow Up Instructions: As needed   I discussed the assessment and treatment plan with the patient. The patient was provided an opportunity to ask questions and  all were answered. The patient agreed with the plan and demonstrated an understanding of the instructions.   The patient was advised to call back or seek an in-person evaluation if the symptoms worsen or if the condition fails to improve as anticipated.  I provided 10 minutes of non-face-to-face time during this encounter.   Malachy Moan, NP

## 2023-10-23 ENCOUNTER — Inpatient Hospital Stay: Payer: Medicare Other

## 2023-10-23 VITALS — BP 102/65

## 2023-10-23 DIAGNOSIS — C184 Malignant neoplasm of transverse colon: Secondary | ICD-10-CM

## 2023-10-23 DIAGNOSIS — Z5111 Encounter for antineoplastic chemotherapy: Secondary | ICD-10-CM | POA: Diagnosis not present

## 2023-10-23 MED ORDER — HEPARIN SOD (PORK) LOCK FLUSH 100 UNIT/ML IV SOLN
500.0000 [IU] | Freq: Once | INTRAVENOUS | Status: AC | PRN
  Administered 2023-10-23: 500 [IU]
  Filled 2023-10-23: qty 5

## 2023-10-23 MED ORDER — SODIUM CHLORIDE 0.9% FLUSH
10.0000 mL | INTRAVENOUS | Status: DC | PRN
  Administered 2023-10-23: 10 mL
  Filled 2023-10-23: qty 10

## 2023-10-26 ENCOUNTER — Ambulatory Visit: Payer: Medicare Other

## 2023-10-26 ENCOUNTER — Ambulatory Visit: Payer: Medicare Other | Admitting: Oncology

## 2023-10-26 ENCOUNTER — Other Ambulatory Visit: Payer: Medicare Other

## 2023-10-27 ENCOUNTER — Ambulatory Visit: Payer: Medicare Other | Admitting: Oncology

## 2023-10-27 ENCOUNTER — Other Ambulatory Visit: Payer: Medicare Other

## 2023-10-27 ENCOUNTER — Ambulatory Visit: Payer: Medicare Other

## 2023-11-03 ENCOUNTER — Ambulatory Visit: Payer: Medicare Other

## 2023-11-03 ENCOUNTER — Ambulatory Visit: Payer: Medicare Other | Admitting: Oncology

## 2023-11-03 ENCOUNTER — Other Ambulatory Visit: Payer: Medicare Other

## 2023-11-04 ENCOUNTER — Inpatient Hospital Stay: Payer: Medicare Other

## 2023-11-04 ENCOUNTER — Inpatient Hospital Stay (HOSPITAL_BASED_OUTPATIENT_CLINIC_OR_DEPARTMENT_OTHER): Payer: Medicare Other | Admitting: Oncology

## 2023-11-04 ENCOUNTER — Inpatient Hospital Stay: Payer: Medicare Other | Attending: Oncology

## 2023-11-04 VITALS — BP 126/73 | HR 98 | Temp 96.8°F | Wt 131.0 lb

## 2023-11-04 VITALS — BP 135/61 | HR 84 | Temp 97.0°F | Resp 18

## 2023-11-04 DIAGNOSIS — F17290 Nicotine dependence, other tobacco product, uncomplicated: Secondary | ICD-10-CM | POA: Insufficient documentation

## 2023-11-04 DIAGNOSIS — C184 Malignant neoplasm of transverse colon: Secondary | ICD-10-CM | POA: Diagnosis not present

## 2023-11-04 DIAGNOSIS — R339 Retention of urine, unspecified: Secondary | ICD-10-CM | POA: Diagnosis not present

## 2023-11-04 DIAGNOSIS — Z5111 Encounter for antineoplastic chemotherapy: Secondary | ICD-10-CM | POA: Insufficient documentation

## 2023-11-04 DIAGNOSIS — N289 Disorder of kidney and ureter, unspecified: Secondary | ICD-10-CM | POA: Diagnosis not present

## 2023-11-04 DIAGNOSIS — D649 Anemia, unspecified: Secondary | ICD-10-CM | POA: Insufficient documentation

## 2023-11-04 DIAGNOSIS — Z85118 Personal history of other malignant neoplasm of bronchus and lung: Secondary | ICD-10-CM | POA: Diagnosis not present

## 2023-11-04 DIAGNOSIS — C189 Malignant neoplasm of colon, unspecified: Secondary | ICD-10-CM | POA: Diagnosis not present

## 2023-11-04 DIAGNOSIS — E1142 Type 2 diabetes mellitus with diabetic polyneuropathy: Secondary | ICD-10-CM | POA: Diagnosis not present

## 2023-11-04 DIAGNOSIS — R630 Anorexia: Secondary | ICD-10-CM | POA: Diagnosis not present

## 2023-11-04 DIAGNOSIS — D72819 Decreased white blood cell count, unspecified: Secondary | ICD-10-CM | POA: Diagnosis not present

## 2023-11-04 DIAGNOSIS — F419 Anxiety disorder, unspecified: Secondary | ICD-10-CM | POA: Insufficient documentation

## 2023-11-04 DIAGNOSIS — Z923 Personal history of irradiation: Secondary | ICD-10-CM | POA: Diagnosis not present

## 2023-11-04 DIAGNOSIS — Z800 Family history of malignant neoplasm of digestive organs: Secondary | ICD-10-CM | POA: Insufficient documentation

## 2023-11-04 LAB — CBC WITH DIFFERENTIAL (CANCER CENTER ONLY)
Abs Immature Granulocytes: 0.01 10*3/uL (ref 0.00–0.07)
Basophils Absolute: 0 10*3/uL (ref 0.0–0.1)
Basophils Relative: 1 %
Eosinophils Absolute: 0.2 10*3/uL (ref 0.0–0.5)
Eosinophils Relative: 6 %
HCT: 28.4 % — ABNORMAL LOW (ref 36.0–46.0)
Hemoglobin: 9 g/dL — ABNORMAL LOW (ref 12.0–15.0)
Immature Granulocytes: 0 %
Lymphocytes Relative: 22 %
Lymphs Abs: 0.7 10*3/uL (ref 0.7–4.0)
MCH: 28.4 pg (ref 26.0–34.0)
MCHC: 31.7 g/dL (ref 30.0–36.0)
MCV: 89.6 fL (ref 80.0–100.0)
Monocytes Absolute: 0.4 10*3/uL (ref 0.1–1.0)
Monocytes Relative: 13 %
Neutro Abs: 1.9 10*3/uL (ref 1.7–7.7)
Neutrophils Relative %: 58 %
Platelet Count: 166 10*3/uL (ref 150–400)
RBC: 3.17 MIL/uL — ABNORMAL LOW (ref 3.87–5.11)
RDW: 17.2 % — ABNORMAL HIGH (ref 11.5–15.5)
WBC Count: 3.3 10*3/uL — ABNORMAL LOW (ref 4.0–10.5)
nRBC: 0 % (ref 0.0–0.2)

## 2023-11-04 LAB — CMP (CANCER CENTER ONLY)
ALT: 11 U/L (ref 0–44)
AST: 12 U/L — ABNORMAL LOW (ref 15–41)
Albumin: 3.3 g/dL — ABNORMAL LOW (ref 3.5–5.0)
Alkaline Phosphatase: 42 U/L (ref 38–126)
Anion gap: 7 (ref 5–15)
BUN: 28 mg/dL — ABNORMAL HIGH (ref 8–23)
CO2: 19 mmol/L — ABNORMAL LOW (ref 22–32)
Calcium: 9.3 mg/dL (ref 8.9–10.3)
Chloride: 113 mmol/L — ABNORMAL HIGH (ref 98–111)
Creatinine: 0.92 mg/dL (ref 0.44–1.00)
GFR, Estimated: >60 mL/min (ref >60)
Glucose, Bld: 170 mg/dL — ABNORMAL HIGH (ref 70–99)
Potassium: 4.3 mmol/L (ref 3.5–5.1)
Sodium: 139 mmol/L (ref 135–145)
Total Bilirubin: 0.5 mg/dL (ref <1.2)
Total Protein: 6.4 g/dL — ABNORMAL LOW (ref 6.5–8.1)

## 2023-11-04 LAB — TOTAL PROTEIN, URINE DIPSTICK: Protein, ur: NEGATIVE mg/dL

## 2023-11-04 MED ORDER — SODIUM CHLORIDE 0.9 % IV SOLN
5.0000 mg/kg | Freq: Once | INTRAVENOUS | Status: AC
  Administered 2023-11-04: 300 mg via INTRAVENOUS
  Filled 2023-11-04: qty 12

## 2023-11-04 MED ORDER — PALONOSETRON HCL INJECTION 0.25 MG/5ML
0.2500 mg | Freq: Once | INTRAVENOUS | Status: AC
  Administered 2023-11-04: 0.25 mg via INTRAVENOUS
  Filled 2023-11-04: qty 5

## 2023-11-04 MED ORDER — ATROPINE SULFATE 1 MG/ML IV SOLN
0.5000 mg | Freq: Once | INTRAVENOUS | Status: AC | PRN
  Administered 2023-11-04: 0.5 mg via INTRAVENOUS
  Filled 2023-11-04: qty 1

## 2023-11-04 MED ORDER — SODIUM CHLORIDE 0.9 % IV SOLN
400.0000 mg/m2 | Freq: Once | INTRAVENOUS | Status: AC
  Administered 2023-11-04: 652 mg via INTRAVENOUS
  Filled 2023-11-04: qty 32.6

## 2023-11-04 MED ORDER — SODIUM CHLORIDE 0.9 % IV SOLN
180.0000 mg/m2 | Freq: Once | INTRAVENOUS | Status: AC
  Administered 2023-11-04: 300 mg via INTRAVENOUS
  Filled 2023-11-04: qty 15

## 2023-11-04 MED ORDER — SODIUM CHLORIDE 0.9 % IV SOLN
2400.0000 mg/m2 | INTRAVENOUS | Status: DC
  Administered 2023-11-04: 3900 mg via INTRAVENOUS
  Filled 2023-11-04: qty 78

## 2023-11-04 MED ORDER — SODIUM CHLORIDE 0.9 % IV SOLN
Freq: Once | INTRAVENOUS | Status: AC
  Filled 2023-11-04: qty 250

## 2023-11-04 MED ORDER — DEXAMETHASONE SODIUM PHOSPHATE 10 MG/ML IJ SOLN
10.0000 mg | Freq: Once | INTRAMUSCULAR | Status: AC
  Administered 2023-11-04: 10 mg via INTRAVENOUS
  Filled 2023-11-04: qty 1

## 2023-11-04 MED ORDER — FLUOROURACIL CHEMO INJECTION 2.5 GM/50ML
400.0000 mg/m2 | Freq: Once | INTRAVENOUS | Status: AC
  Administered 2023-11-04: 650 mg via INTRAVENOUS
  Filled 2023-11-04: qty 13

## 2023-11-04 NOTE — Progress Notes (Signed)
Patient feels like her shortness of breath has worsened, along with how she has trouble swallowing anything. She is starting to have more trouble swallowing solids and liquids.

## 2023-11-04 NOTE — Patient Instructions (Addendum)
[facility] A DEPT OF [facility]  Discharge Instructions: Thank you for choosing [facility] to provide your oncology and hematology care.  If you have a lab appointment with the Cancer Center, please go directly to the Cancer Center and check in at the registration area.  Wear comfortable clothing and clothing appropriate for easy access to any Portacath or PICC line.   We strive to give you quality time with your provider. You may need to reschedule your appointment if you arrive late (15 or more minutes).  Arriving late affects you and other patients whose appointments are after yours.  Also, if you miss three or more appointments without notifying the office, you may be dismissed from the clinic at the provider's discretion.      For prescription refill requests, have your pharmacy contact our office and allow 72 hours for refills to be completed.    Today you received the following chemotherapy and/or immunotherapy agents Irinotecan, Leucovorin and Adrucil and Vegzelma      To help prevent nausea and vomiting after your treatment, we encourage you to take your nausea medication as directed.  BELOW ARE SYMPTOMS THAT SHOULD BE REPORTED IMMEDIATELY: *FEVER GREATER THAN 100.4 F (38 C) OR HIGHER *CHILLS OR SWEATING *NAUSEA AND VOMITING THAT IS NOT CONTROLLED WITH YOUR NAUSEA MEDICATION *UNUSUAL SHORTNESS OF BREATH *UNUSUAL BRUISING OR BLEEDING *URINARY PROBLEMS (pain or burning when urinating, or frequent urination) *BOWEL PROBLEMS (unusual diarrhea, constipation, pain near the anus) TENDERNESS IN MOUTH AND THROAT WITH OR WITHOUT PRESENCE OF ULCERS (sore throat, sores in mouth, or a toothache) UNUSUAL RASH, SWELLING OR PAIN  UNUSUAL VAGINAL DISCHARGE OR ITCHING   Items with * indicate a potential emergency and should be followed up as soon as possible or go to the Emergency Department if any problems should occur.  Please show the  CHEMOTHERAPY ALERT CARD or IMMUNOTHERAPY ALERT CARD at check-in to the Emergency Department and triage nurse.  Should you have questions after your visit or need to cancel or reschedule your appointment, please contact [facility] - A DEPT OF [facility]  [phone] and follow the prompts.  Office hours are 8:00 a.m. to 4:30 p.m. Monday - Friday. Please note that voicemails left after 4:00 p.m. may not be returned until the following business day.  We are closed weekends and major holidays. You have access to a nurse at all times for urgent questions. Please call the main number to the clinic [phone] and follow the prompts.  For any non-urgent questions, you may also contact your provider using MyChart. We now offer e-Visits for anyone 70 and older to request care online for non-urgent symptoms. For details visit [email or web address].   Also download the MyChart app! Go to the app store, search "MyChart", open the app, select [place], and log in with your MyChart username and password.

## 2023-11-04 NOTE — Progress Notes (Signed)
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  INTERVAL HISTORY: Patient returns to clinic today for further evaluation and consideration of cycle 10 of FOLFIRI plus Avastin.  She currently feels well and is asymptomatic.  She continues to have a good appetite and maintains her weight.  She has a chronic peripheral neuropathy, but no other neurologic complaints.  She denies any recent fevers or illnesses.  She has no chest pain, shortness of breath, cough, or hemoptysis.  She has no abdominal pain.  She denies any nausea, vomiting, constipation, or diarrhea.  She has no melena or hematochezia.  She has no urinary complaints.  Patient offers no further specific complaints today.  REVIEW OF SYSTEMS:   Review of Systems  Constitutional:  Positive for malaise/fatigue. Negative for fever and weight loss.  Respiratory: Negative.  Negative for cough, hemoptysis and shortness of breath.   Cardiovascular: Negative.  Negative for chest pain and leg swelling.  Gastrointestinal: Negative.  Negative for abdominal pain, blood in stool, constipation, diarrhea, melena, nausea and vomiting.  Genitourinary: Negative.  Negative for dysuria.  Musculoskeletal: Negative.  Negative for back pain and falls.  Skin: Negative.  Negative for rash.  Neurological:  Positive for sensory change and weakness. Negative for dizziness, focal weakness and headaches.  Psychiatric/Behavioral: Negative.  The patient is not nervous/anxious.     As  per HPI. Otherwise, a complete review of systems is negative.  PAST MEDICAL HISTORY: Past Medical History:  Diagnosis Date   Anemia    Anxiety    Asthma    Cancer (HCC)    colon   CHF (congestive heart failure) (HCC)    COPD (chronic obstructive pulmonary disease) (HCC)    Diabetes mellitus without complication (HCC)    Hyperlipidemia    Hypertension    Stroke [facility])     PAST SURGICAL HISTORY: Past Surgical History:  Procedure Laterality Date   BIOPSY  06/26/2023   Procedure: BIOPSY;  Surgeon: Regis Bill, MD;  Location: [facility] ENDOSCOPY;  Service: Endoscopy;;   COLONOSCOPY N/A 06/26/2023   Procedure: COLONOSCOPY;  Surgeon: Regis Bill, MD;  Location: [facility] ENDOSCOPY;  Service: Endoscopy;  Laterality: N/A;   COLONOSCOPY WITH PROPOFOL N/A 02/03/2022   Procedure: COLONOSCOPY WITH PROPOFOL;  Surgeon: Toney Reil, MD;  Location: [facility] ENDOSCOPY;  Service: Gastroenterology;  Laterality: N/A;   ESOPHAGOGASTRODUODENOSCOPY N/A 06/26/2023   Procedure: ESOPHAGOGASTRODUODENOSCOPY (EGD);  Surgeon: Regis Bill, MD;  Location[facility] ENDOSCOPY;  Service: Endoscopy;  Laterality: N/A;   ESOPHAGOGASTRODUODENOSCOPY (EGD) WITH PROPOFOL N/A 02/03/2022   Procedure: ESOPHAGOGASTRODUODENOSCOPY (EGD) WITH PROPOFOL;  Surgeon: Toney Reil, MD;  Location: [facility] ENDOSCOPY;  Service: Gastroenterology;  Laterality: N/A;   GIVENS CAPSULE STUDY N/A 02/03/2022   Procedure: GIVENS CAPSULE STUDY;  Surgeon: Toney Reil, MD;  Location: [facility] ENDOSCOPY;  Service: Gastroenterology;  Laterality: N/A;  possible Capsule Study   IR IMAGING GUIDED PORT INSERTION  06/18/2022   POLYPECTOMY  06/26/2023   Procedure: POLYPECTOMY;  Surgeon: Regis Bill, MD;  Location: [facility] ENDOSCOPY;  Service: Endoscopy;;    FAMILY HISTORY: Family  History  Problem Relation Age of Onset   Diabetes Mother    Heart attack Father    Colon cancer Father     ADVANCED DIRECTIVES (Y/N):  N  HEALTH  MAINTENANCE: Social History   Tobacco Use   Smoking status: Former    Types: E-cigarettes    Quit date: 12/08/2022    Years since quitting: 0.9   Tobacco comments:    Quit Cigarettes 12/2021  Vaping Use   Vaping status: Every Day   Substances: Nicotine, THC  Substance Use Topics   Alcohol use: Yes    Comment: rare   Drug use: Yes    Types: Marijuana     Colonoscopy:  PAP:  Bone density:  Lipid panel:  Allergies  Allergen Reactions   Codeine Rash    Happened in 1970s    Current Outpatient Medications  Medication Sig Dispense Refill   acetaminophen (TYLENOL) 325 MG tablet Take 650 mg by mouth every 6 (six) hours as needed.     albuterol (VENTOLIN HFA) 108 (90 Base) MCG/ACT inhaler Inhale into the lungs.     atorvastatin (LIPITOR) 40 MG tablet Take 40 mg by mouth daily.     clonazePAM (KLONOPIN) 1 MG tablet Take 1 mg by mouth 2 (two) times daily.     fenofibrate 54 MG tablet Take 54 mg by mouth daily.     gabapentin (NEURONTIN) 600 MG tablet Take by mouth 2 (two) times daily.     insulin lispro (HUMALOG) 100 UNIT/ML injection Inject 1-10 Units into the skin 3 (three) times daily before meals. Per sliding scale (Patient not taking: Reported on 08/25/2023)     LANTUS SOLOSTAR 100 UNIT/ML Solostar Pen Inject 12 Units into the skin at bedtime.     lisinopril (ZESTRIL) 20 MG tablet Take 20 mg by mouth daily.     Magnesium Citrate 200 MG TABS Take 1 tablet by mouth in the morning and at bedtime. 250 twice daily     megestrol (MEGACE) 40 MG tablet Take 1 tablet (40 mg total) by mouth daily. 30 tablet 0   metoprolol succinate (TOPROL-XL) 50 MG 24 hr tablet Take 50 mg by mouth daily. Take with or immediately following a meal.     OLANZapine (ZYPREXA) 10 MG tablet Take 0.5-1 tablets (5-10 mg total) by mouth at bedtime as needed (nausea). 30 tablet 1   pantoprazole (PROTONIX) 40 MG tablet Take 1 tablet (40 mg total) by mouth daily. 30 tablet 5   ranolazine (RANEXA) 500 MG 12 hr tablet  Take 500 mg by mouth 2 (two) times daily.     sitaGLIPtin-metformin (JANUMET) 50-1000 MG tablet Take 1 tablet by mouth daily.     venlafaxine XR (EFFEXOR-XR) 75 MG 24 hr capsule Take 75 mg by mouth daily.     No current facility-administered medications for this visit.   Facility-Administered Medications Ordered in Other Visits  Medication Dose Route Frequency Provider Last Rate Last Admin   0.9 %  sodium chloride infusion   Intravenous Once [name], Tollie Pizza, MD       atropine injection 0.5 mg  0.5 mg Intravenous Once PRN Jeralyn Ruths, MD       bevacizumab-adcd (VEGZELMA) 300 mg in sodium chloride 0.9 % 100 mL chemo infusion  5 mg/kg (Treatment Plan Recorded) Intravenous Once Jeralyn Ruths, MD       dexamethasone (DECADRON) injection 10 mg  10 mg Intravenous Once Jeralyn Ruths, MD       fluorouracil (ADRUCIL)  3,900 mg in sodium chloride 0.9 % 72 mL chemo infusion  2,400 mg/m2 (Treatment Plan Recorded) Intravenous 1 day or 1 dose [name], Tollie Pizza, MD       fluorouracil (ADRUCIL) chemo injection 650 mg  400 mg/m2 (Treatment Plan Recorded) Intravenous Once Jeralyn Ruths, MD       irinotecan (CAMPTOSAR) 300 mg in sodium chloride 0.9 % 500 mL chemo infusion  180 mg/m2 (Treatment Plan Recorded) Intravenous Once Jeralyn Ruths, MD       leucovorin 652 mg in sodium chloride 0.9 % 250 mL infusion  400 mg/m2 (Treatment Plan Recorded) Intravenous Once Jeralyn Ruths, MD       palonosetron (ALOXI) injection 0.25 mg  0.25 mg Intravenous Once Jeralyn Ruths, MD        OBJECTIVE: Vitals:   11/04/23 0923  BP: 126/73  Pulse: 98  Temp: (!) 96.8 F (36 C)  SpO2: 100%        Body mass index is 22.49 kg/m.    ECOG FS:1 - Symptomatic but completely ambulatory  General: Well-developed, well-nourished, no acute distress.  Sitting in a wheelchair. Eyes: Pink conjunctiva, anicteric sclera. HEENT: Normocephalic, moist mucous membranes. Lungs: No audible wheezing  or coughing. Heart: Regular rate and rhythm. Abdomen: Soft, nontender, no obvious distention. Musculoskeletal: No edema, cyanosis, or clubbing. Neuro: Alert, answering all questions appropriately. Cranial nerves grossly intact. Skin: No rashes or petechiae noted. Psych: Normal affect.  LAB RESULTS:  Lab Results  Component Value Date   NA 139 11/04/2023   K 4.3 11/04/2023   CL 113 (H) 11/04/2023   CO2 19 (L) 11/04/2023   GLUCOSE 170 (H) 11/04/2023   BUN 28 (H) 11/04/2023   CREATININE 0.92 11/04/2023   CALCIUM 9.3 11/04/2023   PROT 6.4 (L) 11/04/2023   ALBUMIN 3.3 (L) 11/04/2023   AST 12 (L) 11/04/2023   ALT 11 11/04/2023   ALKPHOS 42 11/04/2023   BILITOT 0.5 11/04/2023   GFRNONAA >60 11/04/2023    Lab Results  Component Value Date   WBC 3.3 (L) 11/04/2023   NEUTROABS 1.9 11/04/2023   HGB 9.0 (L) 11/04/2023   HCT 28.4 (L) 11/04/2023   MCV 89.6 11/04/2023   PLT 166 11/04/2023     STUDIES: No results found.  ASSESSMENT: Stage IIIc colon cancer.  PLAN:    Stage IIIc colon cancer: Patient underwent complete surgical resection at [facility] on May 23, 2022.  Large colon mass was noted to partially invade stomach as well as surrounding mesentery.  PET scan did not reveal any metastatic disease other than a positive lung nodule which was confirmed to be a second primary.  Patient completed her final cycle of adjuvant FOLFOX on December 23, 2022.  Patient's CEA trended up to 181, but now has decreased to 122.  Her most recent result was 131, today's result is pending.  PET scan results from May 20, 2023 reviewed independently with clear progression of disease.  Patient was seen at [facility] and determined not to be a surgical candidate.  Repeat CT scan on August 24, 2023 reviewed independently with essentially stable disease.  Proceed with cycle 10 of treatment today.  Return to clinic in 2 weeks for further evaluation and consideration of cycle 11.  Plan to  reimage at the conclusion of cycle 12.   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  Patient completed XRT in May 2023.  PET and CT scan as above with no obvious evidence of recurrence.  Repeat imaging as above. Pain: Resolved. Anemia: Chronic and unchanged.  Patient's hemoglobin is 9.0 today.   Leukopenia: Mild, monitor.   Neuropathy/falls: Patient does not complain of this today.  Patient was previously given a referral to Occupational Therapy. Coping/anxiety: Patient was previously given a referral to [facility]. Incomplete bladder emptying: Patient does not complain of this today. Poor appetite: Significantly improved with Megace. Weakness and fatigue: Improved.    Patient expressed understanding and was in agreement with this plan. She also understands that She can call clinic at any time with any questions, concerns, or complaints.    Cancer Staging  Colon cancer [facility]) Staging form: Colon and Rectum, AJCC 8th Edition - Pathologic stage from 06/10/2022: Stage IIIC (pT4b, pN1c, cM0) - Signed by Jeralyn Ruths, MD on 06/10/2022 Stage prefix: Initial diagnosis Total positive nodes: 0 Histologic grading system: 4 grade system Histologic grade (G): G3   Jeralyn Ruths, MD   11/04/2023 10:01 AM

## 2023-11-05 LAB — CEA: CEA: 78.4 ng/mL — ABNORMAL HIGH (ref 0.0–4.7)

## 2023-11-06 ENCOUNTER — Inpatient Hospital Stay: Payer: Medicare Other

## 2023-11-06 VITALS — BP 135/75 | HR 81 | Resp 18

## 2023-11-06 DIAGNOSIS — C184 Malignant neoplasm of transverse colon: Secondary | ICD-10-CM

## 2023-11-06 DIAGNOSIS — Z5111 Encounter for antineoplastic chemotherapy: Secondary | ICD-10-CM | POA: Diagnosis not present

## 2023-11-06 MED ORDER — SODIUM CHLORIDE 0.9% FLUSH
10.0000 mL | INTRAVENOUS | Status: DC | PRN
  Administered 2023-11-06: 10 mL
  Filled 2023-11-06: qty 10

## 2023-11-06 MED ORDER — HEPARIN SOD (PORK) LOCK FLUSH 100 UNIT/ML IV SOLN
500.0000 [IU] | Freq: Once | INTRAVENOUS | Status: AC | PRN
  Administered 2023-11-06: 500 [IU]
  Filled 2023-11-06: qty 5

## 2023-11-09 ENCOUNTER — Other Ambulatory Visit: Payer: Medicare Other

## 2023-11-09 ENCOUNTER — Ambulatory Visit: Payer: Medicare Other | Admitting: Oncology

## 2023-11-09 ENCOUNTER — Ambulatory Visit: Payer: Medicare Other

## 2023-11-17 ENCOUNTER — Ambulatory Visit: Payer: Medicare Other | Admitting: Oncology

## 2023-11-17 ENCOUNTER — Ambulatory Visit: Payer: Medicare Other

## 2023-11-17 ENCOUNTER — Other Ambulatory Visit: Payer: Medicare Other

## 2023-11-18 ENCOUNTER — Inpatient Hospital Stay (HOSPITAL_BASED_OUTPATIENT_CLINIC_OR_DEPARTMENT_OTHER): Payer: Medicare Other | Admitting: Oncology

## 2023-11-18 ENCOUNTER — Encounter: Payer: Self-pay | Admitting: Oncology

## 2023-11-18 ENCOUNTER — Inpatient Hospital Stay: Payer: Medicare Other

## 2023-11-18 VITALS — BP 128/70 | HR 95 | Temp 97.8°F | Resp 16 | Wt 129.0 lb

## 2023-11-18 DIAGNOSIS — C184 Malignant neoplasm of transverse colon: Secondary | ICD-10-CM

## 2023-11-18 DIAGNOSIS — Z5111 Encounter for antineoplastic chemotherapy: Secondary | ICD-10-CM | POA: Diagnosis not present

## 2023-11-18 LAB — CBC WITH DIFFERENTIAL (CANCER CENTER ONLY)
Abs Immature Granulocytes: 0.01 10*3/uL (ref 0.00–0.07)
Basophils Absolute: 0 10*3/uL (ref 0.0–0.1)
Basophils Relative: 1 %
Eosinophils Absolute: 0.1 10*3/uL (ref 0.0–0.5)
Eosinophils Relative: 3 %
HCT: 25.5 % — ABNORMAL LOW (ref 36.0–46.0)
Hemoglobin: 8.1 g/dL — ABNORMAL LOW (ref 12.0–15.0)
Immature Granulocytes: 0 %
Lymphocytes Relative: 29 %
Lymphs Abs: 0.8 10*3/uL (ref 0.7–4.0)
MCH: 28.1 pg (ref 26.0–34.0)
MCHC: 31.8 g/dL (ref 30.0–36.0)
MCV: 88.5 fL (ref 80.0–100.0)
Monocytes Absolute: 0.4 10*3/uL (ref 0.1–1.0)
Monocytes Relative: 13 %
Neutro Abs: 1.5 10*3/uL — ABNORMAL LOW (ref 1.7–7.7)
Neutrophils Relative %: 54 %
Platelet Count: 177 10*3/uL (ref 150–400)
RBC: 2.88 MIL/uL — ABNORMAL LOW (ref 3.87–5.11)
RDW: 17.5 % — ABNORMAL HIGH (ref 11.5–15.5)
WBC Count: 2.8 10*3/uL — ABNORMAL LOW (ref 4.0–10.5)
nRBC: 0 % (ref 0.0–0.2)

## 2023-11-18 LAB — CMP (CANCER CENTER ONLY)
ALT: 14 U/L (ref 0–44)
AST: 13 U/L — ABNORMAL LOW (ref 15–41)
Albumin: 3.3 g/dL — ABNORMAL LOW (ref 3.5–5.0)
Alkaline Phosphatase: 35 U/L — ABNORMAL LOW (ref 38–126)
Anion gap: 7 (ref 5–15)
BUN: 30 mg/dL — ABNORMAL HIGH (ref 8–23)
CO2: 21 mmol/L — ABNORMAL LOW (ref 22–32)
Calcium: 8.5 mg/dL — ABNORMAL LOW (ref 8.9–10.3)
Chloride: 109 mmol/L (ref 98–111)
Creatinine: 1.28 mg/dL — ABNORMAL HIGH (ref 0.44–1.00)
GFR, Estimated: 44 mL/min — ABNORMAL LOW (ref >60)
Glucose, Bld: 159 mg/dL — ABNORMAL HIGH (ref 70–99)
Potassium: 3.5 mmol/L (ref 3.5–5.1)
Sodium: 137 mmol/L (ref 135–145)
Total Bilirubin: 0.5 mg/dL (ref <1.2)
Total Protein: 6.3 g/dL — ABNORMAL LOW (ref 6.5–8.1)

## 2023-11-18 LAB — SAMPLE TO BLOOD BANK

## 2023-11-18 LAB — TOTAL PROTEIN, URINE DIPSTICK: Protein, ur: NEGATIVE mg/dL

## 2023-11-18 MED ORDER — IRINOTECAN HCL CHEMO INJECTION 100 MG/5ML
180.0000 mg/m2 | Freq: Once | INTRAVENOUS | Status: AC
  Administered 2023-11-18: 300 mg via INTRAVENOUS
  Filled 2023-11-18: qty 15

## 2023-11-18 MED ORDER — FLUOROURACIL CHEMO INJECTION 2.5 GM/50ML
400.0000 mg/m2 | Freq: Once | INTRAVENOUS | Status: AC
Start: 2023-11-18 — End: 2023-11-18
  Administered 2023-11-18: 650 mg via INTRAVENOUS
  Filled 2023-11-18: qty 13

## 2023-11-18 MED ORDER — SODIUM CHLORIDE 0.9 % IV SOLN
400.0000 mg/m2 | Freq: Once | INTRAVENOUS | Status: AC
  Administered 2023-11-18: 652 mg via INTRAVENOUS
  Filled 2023-11-18: qty 32.6

## 2023-11-18 MED ORDER — SODIUM CHLORIDE 0.9% FLUSH
10.0000 mL | INTRAVENOUS | Status: DC | PRN
  Administered 2023-11-18: 10 mL
  Filled 2023-11-18: qty 10

## 2023-11-18 MED ORDER — PALONOSETRON HCL INJECTION 0.25 MG/5ML
0.2500 mg | Freq: Once | INTRAVENOUS | Status: AC
  Administered 2023-11-18: 0.25 mg via INTRAVENOUS
  Filled 2023-11-18: qty 5

## 2023-11-18 MED ORDER — SODIUM CHLORIDE 0.9 % IV SOLN
2400.0000 mg/m2 | INTRAVENOUS | Status: DC
  Administered 2023-11-18: 3900 mg via INTRAVENOUS
  Filled 2023-11-18: qty 78

## 2023-11-18 MED ORDER — ATROPINE SULFATE 1 MG/ML IV SOLN
0.5000 mg | Freq: Once | INTRAVENOUS | Status: AC | PRN
  Administered 2023-11-18: 0.5 mg via INTRAVENOUS
  Filled 2023-11-18: qty 1

## 2023-11-18 MED ORDER — DEXAMETHASONE SODIUM PHOSPHATE 10 MG/ML IJ SOLN
10.0000 mg | Freq: Once | INTRAMUSCULAR | Status: AC
  Administered 2023-11-18: 10 mg via INTRAVENOUS
  Filled 2023-11-18: qty 1

## 2023-11-18 MED ORDER — BEVACIZUMAB-ADCD CHEMO INJECTION 400 MG/16ML
5.0000 mg/kg | Freq: Once | INTRAVENOUS | Status: AC
  Administered 2023-11-18: 300 mg via INTRAVENOUS
  Filled 2023-11-18: qty 12

## 2023-11-18 MED ORDER — SODIUM CHLORIDE 0.9 % IV SOLN
Freq: Once | INTRAVENOUS | Status: AC
  Filled 2023-11-18: qty 250

## 2023-11-18 NOTE — Progress Notes (Signed)
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  INTERVAL HISTORY: Patient returns to clinic today for further evaluation and consideration of cycle 11 of FOLFIRI plus Avastin.  She continues to have chronic weakness and fatigue, but otherwise feels well.  She has a good appetite and maintains her weight.  She has a chronic peripheral neuropathy, but no other neurologic complaints.  She denies any recent fevers or illnesses.  She has no chest pain, shortness of breath, cough, or hemoptysis.  She has no abdominal pain.  She denies any nausea, vomiting, constipation, or diarrhea.  She has no melena or hematochezia.  She has no urinary complaints.  Patient offers no further specific complaints today.  REVIEW OF SYSTEMS:   Review of Systems  Constitutional:  Positive for malaise/fatigue. Negative for fever and weight loss.  Respiratory: Negative.  Negative for cough, hemoptysis and shortness of breath.   Cardiovascular: Negative.  Negative for chest pain and leg swelling.  Gastrointestinal: Negative.  Negative for abdominal pain, blood in stool, constipation, diarrhea, melena, nausea and vomiting.  Genitourinary: Negative.  Negative for dysuria.  Musculoskeletal: Negative.  Negative for back pain and falls.  Skin: Negative.  Negative for rash.  Neurological:  Positive for sensory change and weakness. Negative for dizziness, focal weakness and headaches.  Psychiatric/Behavioral: Negative.  The patient is not  nervous/anxious.     As per HPI. Otherwise, a complete review of systems is negative.  PAST MEDICAL HISTORY: Past Medical History:  Diagnosis Date   Anemia    Anxiety    Asthma    Cancer (HCC)    colon   CHF (congestive heart failure) (HCC)    COPD (chronic obstructive pulmonary disease) (HCC)    Diabetes mellitus without complication (HCC)    Hyperlipidemia    Hypertension    Stroke [facility])     PAST SURGICAL HISTORY: Past Surgical History:  Procedure Laterality Date   BIOPSY  06/26/2023   Procedure: BIOPSY;  Surgeon: Regis Bill, MD;  Location: [facility] ENDOSCOPY;  Service: Endoscopy;;   COLONOSCOPY N/A 06/26/2023   Procedure: COLONOSCOPY;  Surgeon: Regis Bill, MD;  Location: [facility] ENDOSCOPY;  Service: Endoscopy;  Laterality: N/A;   COLONOSCOPY WITH PROPOFOL N/A 02/03/2022   Procedure: COLONOSCOPY WITH PROPOFOL;  Surgeon: Toney Reil, MD;  Location: [facility] ENDOSCOPY;  Service: Gastroenterology;  Laterality: N/A;   ESOPHAGOGASTRODUODENOSCOPY N/A 06/26/2023   Procedure: ESOPHAGOGASTRODUODENOSCOPY (EGD);  Surgeon: Regis Bill, MD;  Location[facility] ENDOSCOPY;  Service: Endoscopy;  Laterality: N/A;   ESOPHAGOGASTRODUODENOSCOPY (EGD) WITH PROPOFOL N/A 02/03/2022   Procedure: ESOPHAGOGASTRODUODENOSCOPY (EGD) WITH PROPOFOL;  Surgeon: Toney Reil, MD;  Location: [facility] ENDOSCOPY;  Service: Gastroenterology;  Laterality: N/A;   GIVENS CAPSULE STUDY N/A 02/03/2022   Procedure: GIVENS CAPSULE STUDY;  Surgeon: Toney Reil, MD;  Location: [facility] ENDOSCOPY;  Service: Gastroenterology;  Laterality: N/A;  possible Capsule Study   IR IMAGING GUIDED PORT INSERTION  06/18/2022   POLYPECTOMY  06/26/2023   Procedure: POLYPECTOMY;  Surgeon: Regis Bill, MD;  Location: [facility] ENDOSCOPY;  Service: Endoscopy;;  FAMILY HISTORY: Family History  Problem Relation Age of Onset   Diabetes Mother    Heart attack Father    Colon cancer Father     ADVANCED DIRECTIVES  (Y/N):  N  HEALTH MAINTENANCE: Social History   Tobacco Use   Smoking status: Former    Types: E-cigarettes    Quit date: 12/08/2022    Years since quitting: 0.9   Tobacco comments:    Quit Cigarettes 12/2021  Vaping Use   Vaping status: Every Day   Substances: Nicotine, THC  Substance Use Topics   Alcohol use: Yes    Comment: rare   Drug use: Yes    Types: Marijuana     Colonoscopy:  PAP:  Bone density:  Lipid panel:  Allergies  Allergen Reactions   Codeine Rash    Happened in 1970s    Current Outpatient Medications  Medication Sig Dispense Refill   acetaminophen (TYLENOL) 325 MG tablet Take 650 mg by mouth every 6 (six) hours as needed.     albuterol (VENTOLIN HFA) 108 (90 Base) MCG/ACT inhaler Inhale into the lungs.     atorvastatin (LIPITOR) 40 MG tablet Take 40 mg by mouth daily.     clonazePAM (KLONOPIN) 1 MG tablet Take 1 mg by mouth 2 (two) times daily.     fenofibrate 54 MG tablet Take 54 mg by mouth daily.     gabapentin (NEURONTIN) 600 MG tablet Take by mouth 2 (two) times daily.     LANTUS SOLOSTAR 100 UNIT/ML Solostar Pen Inject 12 Units into the skin at bedtime.     lisinopril (ZESTRIL) 20 MG tablet Take 20 mg by mouth daily.     megestrol (MEGACE) 40 MG tablet Take 1 tablet (40 mg total) by mouth daily. 30 tablet 0   metoprolol succinate (TOPROL-XL) 50 MG 24 hr tablet Take 50 mg by mouth daily. Take with or immediately following a meal.     OLANZapine (ZYPREXA) 10 MG tablet Take 0.5-1 tablets (5-10 mg total) by mouth at bedtime as needed (nausea). 30 tablet 1   pantoprazole (PROTONIX) 40 MG tablet Take 1 tablet (40 mg total) by mouth daily. 30 tablet 5   ranolazine (RANEXA) 500 MG 12 hr tablet Take 500 mg by mouth 2 (two) times daily.     sitaGLIPtin-metformin (JANUMET) 50-1000 MG tablet Take 1 tablet by mouth daily.     venlafaxine XR (EFFEXOR-XR) 75 MG 24 hr capsule Take 75 mg by mouth daily.     insulin lispro (HUMALOG) 100 UNIT/ML injection Inject  1-10 Units into the skin 3 (three) times daily before meals. Per sliding scale (Patient not taking: Reported on 08/25/2023)     Magnesium Citrate 200 MG TABS Take 1 tablet by mouth in the morning and at bedtime. 250 twice daily (Patient not taking: Reported on 11/18/2023)     No current facility-administered medications for this visit.   Facility-Administered Medications Ordered in Other Visits  Medication Dose Route Frequency Provider Last Rate Last Admin   fluorouracil (ADRUCIL) 3,900 mg in sodium chloride 0.9 % 72 mL chemo infusion  2,400 mg/m2 (Treatment Plan Recorded) Intravenous 1 day or 1 dose [name], Tollie Pizza, MD       fluorouracil (ADRUCIL) chemo injection 650 mg  400 mg/m2 (Treatment Plan Recorded) Intravenous Once Jeralyn Ruths, MD       sodium chloride flush (NS) 0.9 % injection 10 mL  10 mL Intracatheter PRN Jeralyn Ruths, MD   10 mL at 11/18/23 1000  OBJECTIVE: Vitals:   11/18/23 0906  BP: 128/70  Pulse: 95  Resp: 16  Temp: 97.8 F (36.6 C)  SpO2: 100%         Body mass index is 22.14 kg/m.    ECOG FS:1 - Symptomatic but completely ambulatory  General: Well-developed, well-nourished, no acute distress.  Sitting in a wheelchair. Eyes: Pink conjunctiva, anicteric sclera. HEENT: Normocephalic, moist mucous membranes. Lungs: No audible wheezing or coughing. Heart: Regular rate and rhythm. Abdomen: Soft, nontender, no obvious distention. Musculoskeletal: No edema, cyanosis, or clubbing. Neuro: Alert, answering all questions appropriately. Cranial nerves grossly intact. Skin: No rashes or petechiae noted. Psych: Normal affect.  LAB RESULTS:  Lab Results  Component Value Date   NA 137 11/18/2023   K 3.5 11/18/2023   CL 109 11/18/2023   CO2 21 (L) 11/18/2023   GLUCOSE 159 (H) 11/18/2023   BUN 30 (H) 11/18/2023   CREATININE 1.28 (H) 11/18/2023   CALCIUM 8.5 (L) 11/18/2023   PROT 6.3 (L) 11/18/2023   ALBUMIN 3.3 (L) 11/18/2023   AST 13 (L)  11/18/2023   ALT 14 11/18/2023   ALKPHOS 35 (L) 11/18/2023   BILITOT 0.5 11/18/2023   GFRNONAA 44 (L) 11/18/2023    Lab Results  Component Value Date   WBC 2.8 (L) 11/18/2023   NEUTROABS 1.5 (L) 11/18/2023   HGB 8.1 (L) 11/18/2023   HCT 25.5 (L) 11/18/2023   MCV 88.5 11/18/2023   PLT 177 11/18/2023     STUDIES: No results found.  ASSESSMENT: Stage IIIc colon cancer.  PLAN:    Stage IIIc colon cancer: Patient underwent complete surgical resection at [facility] on May 23, 2022.  Large colon mass was noted to partially invade stomach as well as surrounding mesentery.  PET scan did not reveal any metastatic disease other than a positive lung nodule which was confirmed to be a second primary.  Patient completed her final cycle of adjuvant FOLFOX on December 23, 2022.  Patient's CEA trended up to 181, but now has decreased to 122.  Her most recent result was 131, today's result is pending.  PET scan results from May 20, 2023 reviewed independently with clear progression of disease.  Patient was seen at [facility] and determined not to be a surgical candidate.  Repeat CT scan on August 24, 2023 reviewed independently with essentially stable disease.  Proceed with cycle 11 of treatment today.  Return to clinic in 2 days for pump removal and then in 3 weeks for further evaluation and consideration of cycle 12.  Plan to reimage at the conclusion of cycle 12.   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  Patient completed XRT in May 2023.  PET and CT scan as above with no obvious evidence of recurrence.  Repeat imaging as above. Pain: Resolved. Anemia: Patient's hemoglobin has trended down to 8.1.  Return to clinic on November 23, 2023 for 1 unit packed red blood cells.   Leukopenia: Chronic and unchanged.  Proceed with treatment as above. Renal insufficiency: Mild, monitor. Neuropathy/falls: Patient does not  complain of this today.  Patient was previously given a referral to Occupational Therapy. Coping/anxiety: Patient was previously given a referral to [facility]. Incomplete bladder emptying: Patient does not complain of this today. Poor appetite: Significantly improved with Megace. Weakness and fatigue: Improved.    Patient expressed understanding and was in agreement with this plan. She also understands that  She can call clinic at any time with any questions, concerns, or complaints.    Cancer Staging  Colon cancer [facility]) Staging form: Colon and Rectum, AJCC 8th Edition - Pathologic stage from 06/10/2022: Stage IIIC (pT4b, pN1c, cM0) - Signed by Jeralyn Ruths, MD on 06/10/2022 Stage prefix: Initial diagnosis Total positive nodes: 0 Histologic grading system: 4 grade system Histologic grade (G): G3   Jeralyn Ruths, MD   11/18/2023 1:05 PM

## 2023-11-18 NOTE — Progress Notes (Signed)
Pt in for follow up denies any concerns.  °

## 2023-11-18 NOTE — Patient Instructions (Signed)
[facility] - A DEPT OF [facility]  Discharge Instructions: Thank you for choosing [facility] to provide your oncology and hematology care.  If you have a lab appointment with the Cancer Center, please go directly to the Cancer Center and check in at the registration area.  Wear comfortable clothing and clothing appropriate for easy access to any Portacath or PICC line.   We strive to give you quality time with your provider. You may need to reschedule your appointment if you arrive late (15 or more minutes).  Arriving late affects you and other patients whose appointments are after yours.  Also, if you miss three or more appointments without notifying the office, you may be dismissed from the clinic at the provider's discretion.      For prescription refill requests, have your pharmacy contact our office and allow 72 hours for refills to be completed.    Today you received the following chemotherapy and/or immunotherapy agents: bevacizumab/ fluorouracil/ leucovorin    To help prevent nausea and vomiting after your treatment, we encourage you to take your nausea medication as directed.  BELOW ARE SYMPTOMS THAT SHOULD BE REPORTED IMMEDIATELY: *FEVER GREATER THAN 100.4 F (38 C) OR HIGHER *CHILLS OR SWEATING *NAUSEA AND VOMITING THAT IS NOT CONTROLLED WITH YOUR NAUSEA MEDICATION *UNUSUAL SHORTNESS OF BREATH *UNUSUAL BRUISING OR BLEEDING *URINARY PROBLEMS (pain or burning when urinating, or frequent urination) *BOWEL PROBLEMS (unusual diarrhea, constipation, pain near the anus) TENDERNESS IN MOUTH AND THROAT WITH OR WITHOUT PRESENCE OF ULCERS (sore throat, sores in mouth, or a toothache) UNUSUAL RASH, SWELLING OR PAIN  UNUSUAL VAGINAL DISCHARGE OR ITCHING   Items with * indicate a potential emergency and should be followed up as soon as possible or go to the Emergency Department if any problems should occur.  Please show the CHEMOTHERAPY  ALERT CARD or IMMUNOTHERAPY ALERT CARD at check-in to the Emergency Department and triage nurse.  Should you have questions after your visit or need to cancel or reschedule your appointment, please contact [facility] - A DEPT OF [facility]  [phone] and follow the prompts.  Office hours are 8:00 a.m. to 4:30 p.m. Monday - Friday. Please note that voicemails left after 4:00 p.m. may not be returned until the following business day.  We are closed weekends and major holidays. You have access to a nurse at all times for urgent questions. Please call the main number to the clinic [phone] and follow the prompts.  For any non-urgent questions, you may also contact your provider using MyChart. We now offer e-Visits for anyone 47 and older to request care online for non-urgent symptoms. For details visit [email or web address].   Also download the MyChart app! Go to the app store, search "MyChart", open the app, select , and log in with your MyChart username and password.

## 2023-11-19 LAB — CEA: CEA: 56.9 ng/mL — ABNORMAL HIGH (ref 0.0–4.7)

## 2023-11-20 ENCOUNTER — Other Ambulatory Visit: Payer: Self-pay | Admitting: *Deleted

## 2023-11-20 ENCOUNTER — Inpatient Hospital Stay: Payer: Medicare Other

## 2023-11-20 VITALS — BP 130/70

## 2023-11-20 DIAGNOSIS — C184 Malignant neoplasm of transverse colon: Secondary | ICD-10-CM

## 2023-11-20 DIAGNOSIS — Z5111 Encounter for antineoplastic chemotherapy: Secondary | ICD-10-CM | POA: Diagnosis not present

## 2023-11-20 LAB — PREPARE RBC (CROSSMATCH)

## 2023-11-20 MED ORDER — HEPARIN SOD (PORK) LOCK FLUSH 100 UNIT/ML IV SOLN
500.0000 [IU] | Freq: Once | INTRAVENOUS | Status: AC | PRN
  Administered 2023-11-20: 500 [IU]
  Filled 2023-11-20: qty 5

## 2023-11-20 MED ORDER — SODIUM CHLORIDE 0.9% FLUSH
10.0000 mL | INTRAVENOUS | Status: DC | PRN
Start: 2023-11-20 — End: 2023-11-20
  Administered 2023-11-20: 10 mL
  Filled 2023-11-20: qty 10

## 2023-11-23 ENCOUNTER — Inpatient Hospital Stay: Payer: Medicare Other

## 2023-11-23 DIAGNOSIS — C184 Malignant neoplasm of transverse colon: Secondary | ICD-10-CM

## 2023-11-23 DIAGNOSIS — Z5111 Encounter for antineoplastic chemotherapy: Secondary | ICD-10-CM | POA: Diagnosis not present

## 2023-11-23 MED ORDER — SODIUM CHLORIDE 0.9% IV SOLUTION
250.0000 mL | INTRAVENOUS | Status: DC
  Administered 2023-11-23: 250 mL via INTRAVENOUS
  Filled 2023-11-23: qty 250

## 2023-11-23 MED ORDER — DIPHENHYDRAMINE HCL 50 MG/ML IJ SOLN
25.0000 mg | Freq: Once | INTRAMUSCULAR | Status: AC
  Administered 2023-11-23: 25 mg via INTRAVENOUS
  Filled 2023-11-23: qty 1

## 2023-11-23 MED ORDER — ACETAMINOPHEN 325 MG PO TABS
650.0000 mg | ORAL_TABLET | Freq: Once | ORAL | Status: AC
  Administered 2023-11-23: 650 mg via ORAL
  Filled 2023-11-23: qty 2

## 2023-11-23 MED ORDER — HEPARIN SOD (PORK) LOCK FLUSH 100 UNIT/ML IV SOLN
500.0000 [IU] | Freq: Every day | INTRAVENOUS | Status: AC | PRN
Start: 2023-11-23 — End: 2023-11-23
  Administered 2023-11-23: 500 [IU]
  Filled 2023-11-23: qty 5

## 2023-11-23 NOTE — Patient Instructions (Signed)
Blood Transfusion, Adult A blood transfusion is a procedure in which you receive blood through an IV tube. You may need this procedure because of: A bleeding disorder. An illness. An injury. A surgery. The blood may come from someone else (a donor). You may also be able to donate blood for yourself before a surgery. The blood given in a transfusion may be made up of different types of cells. You may get: Red blood cells. These carry oxygen to the cells in the body. Platelets. These help your blood to clot. Plasma. This is the liquid part of your blood. It carries proteins and other substances through the body. White blood cells. These help you fight infections. If you have a clotting disorder, you may also get other types of blood products. Depending on the type of blood product, this procedure may take 1-4 hours to complete. Tell your doctor about: Any bleeding problems you have. Any reactions you have had during a blood transfusion in the past. Any allergies you have. All medicines you are taking, including vitamins, herbs, eye drops, creams, and over-the-counter medicines. Any surgeries you have had. Any medical conditions you have. Whether you are pregnant or may be pregnant. What are the risks? Talk with your health care provider about risks. The most common problems include: A mild allergic reaction. This includes red, swollen areas of skin (hives) and itching. Fever or chills. This may be the body's response to new blood cells received. This may happen during or up to 4 hours after the transfusion. More serious problems may include: A serious allergic reaction. This includes breathing trouble or swelling around the face and lips. Too much fluid in the lungs. This may cause breathing problems. Lung injury. This causes breathing trouble and low oxygen in the blood. This can happen within hours of the transfusion or days later. Too much iron. This can happen after getting many blood  transfusions over a period of time. An infection or virus passed through the blood. This is rare. Donated blood is carefully tested before it is given. Your body's defense system (immune system) trying to attack the new blood cells. This is rare. Symptoms may include fever, chills, nausea, low blood pressure, and low back or chest pain. Donated cells attacking healthy tissues. This is rare. What happens before the procedure? You will have a blood test to find out your blood type. The test also finds out what type of blood your body will accept and matches it to the donor type. If you are going to have a planned surgery, you may be able to donate your own blood. This may be done in case you need a transfusion. You will have your temperature, blood pressure, and pulse checked. You may receive medicine to help prevent an allergic reaction. This may be done if you have had a reaction to a transfusion before. This medicine may be given to you by mouth or through an IV tube. What happens during the procedure?  An IV tube will be put into one of your veins. The bag of blood will be attached to your IV tube. Then, the blood will enter through your vein. Your temperature, blood pressure, and pulse will be checked often. This is done to find early signs of a transfusion reaction. Tell your nurse right away if you have any of these symptoms: Shortness of breath or trouble breathing. Chest or back pain. Fever or chills. Red, swollen areas of skin or itching. If you have any signs   or symptoms of a reaction, your transfusion will be stopped. You may also be given medicine. When the transfusion is finished, your IV tube will be taken out. Pressure may be put on the IV site for a few minutes. A bandage (dressing) will be put on the IV site. The procedure may vary among doctors and hospitals. What happens after the procedure? You will be monitored until you leave the hospital or clinic. This includes  checking your temperature, blood pressure, pulse, breathing rate, and blood oxygen level. Your blood may be tested to see how you have responded to the transfusion. You may be warmed with fluids or blankets. This is done to keep the temperature of your body normal. If you have your procedure in an outpatient setting, you will be told whom to contact to report any reactions. Where to find more information Visit the [employer]: redcross.org Summary A blood transfusion is a procedure in which you receive blood through an IV tube. The blood you are given may be made up of different blood cells. You may receive red blood cells, platelets, plasma, or white blood cells. Your temperature, blood pressure, and pulse will be checked often. After the procedure, your blood may be tested to see how you have responded. This information is not intended to replace advice given to you by your health care provider. Make sure you discuss any questions you have with your health care provider. Document Revised: 02/14/2022 Document Reviewed: 02/14/2022 Elsevier Patient Education  2024 Elsevier Inc.  

## 2023-11-24 LAB — TYPE AND SCREEN
ABO/RH(D): A POS
Antibody Screen: NEGATIVE
Unit division: 0

## 2023-11-24 LAB — BPAM RBC
Blood Product Expiration Date: 202501192359
ISSUE DATE / TIME: 202412230853
Unit Type and Rh: 202501192359
Unit Type and Rh: 6200

## 2023-12-03 ENCOUNTER — Encounter: Payer: Self-pay | Admitting: Oncology

## 2023-12-09 ENCOUNTER — Encounter: Payer: Self-pay | Admitting: Oncology

## 2023-12-09 ENCOUNTER — Inpatient Hospital Stay (HOSPITAL_BASED_OUTPATIENT_CLINIC_OR_DEPARTMENT_OTHER): Payer: Medicare Other | Admitting: Oncology

## 2023-12-09 ENCOUNTER — Inpatient Hospital Stay: Payer: Medicare Other

## 2023-12-09 ENCOUNTER — Inpatient Hospital Stay: Payer: Medicare Other | Attending: Oncology

## 2023-12-09 VITALS — BP 121/73 | HR 84 | Temp 98.4°F | Resp 16 | Ht 64.0 in | Wt 130.0 lb

## 2023-12-09 DIAGNOSIS — Z85118 Personal history of other malignant neoplasm of bronchus and lung: Secondary | ICD-10-CM | POA: Diagnosis not present

## 2023-12-09 DIAGNOSIS — C184 Malignant neoplasm of transverse colon: Secondary | ICD-10-CM

## 2023-12-09 DIAGNOSIS — Z5111 Encounter for antineoplastic chemotherapy: Secondary | ICD-10-CM | POA: Insufficient documentation

## 2023-12-09 DIAGNOSIS — C189 Malignant neoplasm of colon, unspecified: Secondary | ICD-10-CM | POA: Diagnosis not present

## 2023-12-09 DIAGNOSIS — D649 Anemia, unspecified: Secondary | ICD-10-CM | POA: Diagnosis not present

## 2023-12-09 DIAGNOSIS — R110 Nausea: Secondary | ICD-10-CM

## 2023-12-09 DIAGNOSIS — F17290 Nicotine dependence, other tobacco product, uncomplicated: Secondary | ICD-10-CM | POA: Insufficient documentation

## 2023-12-09 LAB — CMP (CANCER CENTER ONLY)
ALT: 9 U/L (ref 0–44)
AST: 14 U/L — ABNORMAL LOW (ref 15–41)
Albumin: 3.1 g/dL — ABNORMAL LOW (ref 3.5–5.0)
Alkaline Phosphatase: 37 U/L — ABNORMAL LOW (ref 38–126)
Anion gap: 8 (ref 5–15)
BUN: 18 mg/dL (ref 8–23)
CO2: 27 mmol/L (ref 22–32)
Calcium: 8 mg/dL — ABNORMAL LOW (ref 8.9–10.3)
Chloride: 106 mmol/L (ref 98–111)
Creatinine: 0.96 mg/dL (ref 0.44–1.00)
GFR, Estimated: >60 mL/min (ref >60)
Glucose, Bld: 130 mg/dL — ABNORMAL HIGH (ref 70–99)
Potassium: 3.4 mmol/L — ABNORMAL LOW (ref 3.5–5.1)
Sodium: 141 mmol/L (ref 135–145)
Total Bilirubin: 0.4 mg/dL (ref 0.0–1.2)
Total Protein: 5.7 g/dL — ABNORMAL LOW (ref 6.5–8.1)

## 2023-12-09 LAB — CBC WITH DIFFERENTIAL (CANCER CENTER ONLY)
Abs Immature Granulocytes: 0.25 10*3/uL — ABNORMAL HIGH (ref 0.00–0.07)
Basophils Absolute: 0 10*3/uL (ref 0.0–0.1)
Basophils Relative: 1 %
Eosinophils Absolute: 0.1 10*3/uL (ref 0.0–0.5)
Eosinophils Relative: 1 %
HCT: 26.7 % — ABNORMAL LOW (ref 36.0–46.0)
Hemoglobin: 8.7 g/dL — ABNORMAL LOW (ref 12.0–15.0)
Immature Granulocytes: 4 %
Lymphocytes Relative: 15 %
Lymphs Abs: 1 10*3/uL (ref 0.7–4.0)
MCH: 29.1 pg (ref 26.0–34.0)
MCHC: 32.6 g/dL (ref 30.0–36.0)
MCV: 89.3 fL (ref 80.0–100.0)
Monocytes Absolute: 0.7 10*3/uL (ref 0.1–1.0)
Monocytes Relative: 10 %
Neutro Abs: 4.6 10*3/uL (ref 1.7–7.7)
Neutrophils Relative %: 69 %
Platelet Count: 192 10*3/uL (ref 150–400)
RBC: 2.99 MIL/uL — ABNORMAL LOW (ref 3.87–5.11)
RDW: 17.9 % — ABNORMAL HIGH (ref 11.5–15.5)
WBC Count: 6.6 10*3/uL (ref 4.0–10.5)
nRBC: 0 % (ref 0.0–0.2)

## 2023-12-09 LAB — TOTAL PROTEIN, URINE DIPSTICK

## 2023-12-09 MED ORDER — ONDANSETRON HCL 8 MG PO TABS: 8.0000 mg | ORAL_TABLET | Freq: Three times a day (TID) | ORAL | 2 refills | Status: DC | PRN

## 2023-12-09 MED ORDER — SODIUM CHLORIDE 0.9 % IV SOLN
400.0000 mg/m2 | Freq: Once | INTRAVENOUS | Status: AC
  Administered 2023-12-09: 652 mg via INTRAVENOUS
  Filled 2023-12-09: qty 32.6

## 2023-12-09 MED ORDER — DEXAMETHASONE SODIUM PHOSPHATE 10 MG/ML IJ SOLN
10.0000 mg | Freq: Once | INTRAMUSCULAR | Status: AC
Start: 2023-12-09 — End: 2023-12-09
  Administered 2023-12-09: 10 mg via INTRAVENOUS
  Filled 2023-12-09: qty 1

## 2023-12-09 MED ORDER — ATROPINE SULFATE 1 MG/ML IV SOLN
0.5000 mg | Freq: Once | INTRAVENOUS | Status: AC | PRN
  Administered 2023-12-09: 0.5 mg via INTRAVENOUS
  Filled 2023-12-09: qty 1

## 2023-12-09 MED ORDER — BEVACIZUMAB-ADCD CHEMO INJECTION 400 MG/16ML
5.0000 mg/kg | Freq: Once | INTRAVENOUS | Status: AC
  Administered 2023-12-09: 300 mg via INTRAVENOUS
  Filled 2023-12-09: qty 12

## 2023-12-09 MED ORDER — PALONOSETRON HCL INJECTION 0.25 MG/5ML
0.2500 mg | Freq: Once | INTRAVENOUS | Status: AC
  Administered 2023-12-09: 0.25 mg via INTRAVENOUS
  Filled 2023-12-09: qty 5

## 2023-12-09 MED ORDER — FLUOROURACIL CHEMO INJECTION 2.5 GM/50ML
400.0000 mg/m2 | Freq: Once | INTRAVENOUS | Status: AC
  Administered 2023-12-09: 650 mg via INTRAVENOUS
  Filled 2023-12-09: qty 13

## 2023-12-09 MED ORDER — SODIUM CHLORIDE 0.9 % IV SOLN
2400.0000 mg/m2 | INTRAVENOUS | Status: DC
  Administered 2023-12-09: 3900 mg via INTRAVENOUS
  Filled 2023-12-09: qty 78

## 2023-12-09 MED ORDER — PROCHLORPERAZINE EDISYLATE 10 MG/2ML IJ SOLN
10.0000 mg | Freq: Once | INTRAMUSCULAR | Status: AC
  Administered 2023-12-09: 10 mg via INTRAVENOUS

## 2023-12-09 MED ORDER — SODIUM CHLORIDE 0.9 % IV SOLN
180.0000 mg/m2 | Freq: Once | INTRAVENOUS | Status: AC
  Administered 2023-12-09: 300 mg via INTRAVENOUS
  Filled 2023-12-09: qty 15

## 2023-12-09 MED ORDER — SODIUM CHLORIDE 0.9 % IV SOLN
Freq: Once | INTRAVENOUS | Status: AC
  Filled 2023-12-09: qty 250

## 2023-12-09 NOTE — Progress Notes (Signed)
 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  INTERVAL HISTORY: Patient returns to clinic today for further evaluation and consideration of cycle 12 of FOLFIRI plus Avastin .  She has noticed increased nausea and decreased appetite over the past week, but otherwise has felt well.  She continues to have chronic weakness and fatigue.  She has a chronic peripheral neuropathy, but no other neurologic complaints.  She denies any recent fevers or illnesses.  She has no chest pain, shortness of breath, cough, or hemoptysis.  She has no abdominal pain.  She denies any vomiting, constipation, or diarrhea.  She has no melena or hematochezia.  She has no urinary complaints.  Patient offers no further specific complaints today.  REVIEW OF SYSTEMS:   Review of Systems  Constitutional:  Positive for malaise/fatigue. Negative for fever and weight loss.  Respiratory: Negative.  Negative for cough, hemoptysis and shortness of breath.   Cardiovascular: Negative.  Negative for chest pain and leg swelling.  Gastrointestinal:  Positive for nausea. Negative for abdominal pain, blood in stool, constipation, diarrhea, melena and vomiting.  Genitourinary: Negative.  Negative for dysuria.  Musculoskeletal: Negative.  Negative for back pain and falls.  Skin: Negative.  Negative for rash.  Neurological:  Positive for sensory change and weakness. Negative for dizziness, focal weakness and headaches.  Psychiatric/Behavioral: Negative.  The  patient is not nervous/anxious.     As per HPI. Otherwise, a complete review of systems is negative.  PAST MEDICAL HISTORY: Past Medical History:  Diagnosis Date   Anemia    Anxiety    Asthma    Cancer (HCC)    colon   CHF (congestive heart failure) (HCC)    COPD (chronic obstructive pulmonary disease) (HCC)    Diabetes mellitus without complication (HCC)    Hyperlipidemia    Hypertension    Stroke [facility])     PAST SURGICAL HISTORY: Past Surgical History:  Procedure Laterality Date   BIOPSY  06/26/2023   Procedure: BIOPSY;  Surgeon: Maryruth Ole DASEN, MD;  Location: [facility] ENDOSCOPY;  Service: Endoscopy;;   COLONOSCOPY N/A 06/26/2023   Procedure: COLONOSCOPY;  Surgeon: Maryruth Ole DASEN, MD;  Location: [facility] ENDOSCOPY;  Service: Endoscopy;  Laterality: N/A;   COLONOSCOPY WITH PROPOFOL  N/A 02/03/2022   Procedure: COLONOSCOPY WITH PROPOFOL ;  Surgeon: Unk Corinn Skiff, MD;  Location: [facility] ENDOSCOPY;  Service: Gastroenterology;  Laterality: N/A;   ESOPHAGOGASTRODUODENOSCOPY N/A 06/26/2023   Procedure: ESOPHAGOGASTRODUODENOSCOPY (EGD);  Surgeon: Maryruth Ole DASEN, MD;  Location: [facility] ENDOSCOPY;  Service: Endoscopy;  Laterality: N/A;   ESOPHAGOGASTRODUODENOSCOPY (EGD) WITH PROPOFOL  N/A 02/03/2022   Procedure: ESOPHAGOGASTRODUODENOSCOPY (EGD) WITH PROPOFOL ;  Surgeon: Unk Corinn Skiff, MD;  Location: [facility] ENDOSCOPY;  Service: Gastroenterology;  Laterality: N/A;   GIVENS CAPSULE STUDY N/A 02/03/2022   Procedure: GIVENS CAPSULE STUDY;  Surgeon: Unk Corinn Skiff, MD;  Location: [facility] ENDOSCOPY;  Service: Gastroenterology;  Laterality: N/A;  possible Capsule Study   IR IMAGING GUIDED PORT INSERTION  06/18/2022   POLYPECTOMY  06/26/2023   Procedure: POLYPECTOMY;  Surgeon: Maryruth Ole DASEN, MD;  Location: [facility] ENDOSCOPY;  Service:  Endoscopy;;    FAMILY HISTORY: Family History  Problem Relation Age of Onset   Diabetes Mother    Heart attack Father    Colon cancer Father      ADVANCED DIRECTIVES (Y/N):  N  HEALTH MAINTENANCE: Social History   Tobacco Use   Smoking status: Former    Types: E-cigarettes    Quit date: 12/08/2022    Years since quitting: 1.0   Tobacco comments:    Quit Cigarettes 12/2021  Vaping Use   Vaping status: Every Day   Substances: Nicotine, THC  Substance Use Topics   Alcohol use: Yes    Comment: rare   Drug use: Yes    Types: Marijuana     Colonoscopy:  PAP:  Bone density:  Lipid panel:  Allergies  Allergen Reactions   Codeine Rash    Happened in 1970s    Current Outpatient Medications  Medication Sig Dispense Refill   acetaminophen  (TYLENOL ) 325 MG tablet Take 650 mg by mouth every 6 (six) hours as needed.     albuterol  (VENTOLIN  HFA) 108 (90 Base) MCG/ACT inhaler Inhale into the lungs.     atorvastatin  (LIPITOR) 40 MG tablet Take 40 mg by mouth daily.     clonazePAM  (KLONOPIN ) 1 MG tablet Take 1 mg by mouth 2 (two) times daily.     fenofibrate  54 MG tablet Take 54 mg by mouth daily.     gabapentin  (NEURONTIN ) 600 MG tablet Take by mouth 2 (two) times daily.     LANTUS  SOLOSTAR 100 UNIT/ML Solostar Pen Inject 12 Units into the skin at bedtime.     lisinopril (ZESTRIL) 20 MG tablet Take 20 mg by mouth daily.     megestrol  (MEGACE ) 40 MG tablet Take 1 tablet (40 mg total) by mouth daily. 30 tablet 0   metoprolol  succinate (TOPROL -XL) 50 MG 24 hr tablet Take 50 mg by mouth daily. Take with or immediately following a meal.     OLANZapine  (ZYPREXA ) 10 MG tablet Take 0.5-1 tablets (5-10 mg total) by mouth at bedtime as needed (nausea). 30 tablet 1   ondansetron  (ZOFRAN ) 8 MG tablet Take 1 tablet (8 mg total) by mouth every 8 (eight) hours as needed for nausea or vomiting. 60 tablet 2   ranolazine  (RANEXA ) 500 MG 12 hr tablet Take 500 mg by mouth 2 (two) times daily.     sitaGLIPtin-metformin (JANUMET) 50-1000 MG tablet Take 1 tablet by mouth daily.     venlafaxine  XR (EFFEXOR -XR) 75 MG 24 hr capsule Take 75 mg by  mouth daily.     insulin  lispro (HUMALOG) 100 UNIT/ML injection Inject 1-10 Units into the skin 3 (three) times daily before meals. Per sliding scale (Patient not taking: Reported on 08/25/2023)     Magnesium  Citrate 200 MG TABS Take 1 tablet by mouth in the morning and at bedtime. 250 twice daily (Patient not taking: Reported on 12/09/2023)     pantoprazole  (PROTONIX ) 40 MG tablet Take 1 tablet (40 mg total) by mouth daily. 30 tablet 5   No current facility-administered medications for this visit.   Facility-Administered Medications Ordered in Other Visits  Medication Dose Route Frequency Provider Last Rate Last Admin   bevacizumab -adcd (VEGZELMA ) 300 mg in sodium chloride  0.9 % 100 mL chemo infusion  5 mg/kg (Treatment Plan Recorded) Intravenous Once Keylan Costabile J, MD 672 mL/hr at 12/09/23 1224 300 mg at 12/09/23 1224   fluorouracil  (ADRUCIL ) 3,900 mg in sodium chloride  0.9 % 72 mL chemo infusion  2,400  mg/m2 (Treatment Plan Recorded) Intravenous 1 day or 1 dose Maliek Schellhorn J, MD       fluorouracil  (ADRUCIL ) chemo injection 650 mg  400 mg/m2 (Treatment Plan Recorded) Intravenous Once Stran Raper J, MD        OBJECTIVE: Vitals:   12/09/23 0832  BP: 121/73  Pulse: 84  Resp: 16  Temp: 98.4 F (36.9 C)  SpO2: 98%         Body mass index is 22.31 kg/m.    ECOG FS:1 - Symptomatic but completely ambulatory  General: Well-developed, well-nourished, no acute distress.  Sitting in a wheelchair. Eyes: Pink conjunctiva, anicteric sclera. HEENT: Normocephalic, moist mucous membranes. Lungs: No audible wheezing or coughing. Heart: Regular rate and rhythm. Abdomen: Soft, nontender, no obvious distention. Musculoskeletal: No edema, cyanosis, or clubbing. Neuro: Alert, answering all questions appropriately. Cranial nerves grossly intact. Skin: No rashes or petechiae noted. Psych: Normal affect.   LAB RESULTS:  Lab Results  Component Value Date   NA 141 12/09/2023   K  3.4 (L) 12/09/2023   CL 106 12/09/2023   CO2 27 12/09/2023   GLUCOSE 130 (H) 12/09/2023   BUN 18 12/09/2023   CREATININE 0.96 12/09/2023   CALCIUM  8.0 (L) 12/09/2023   PROT 5.7 (L) 12/09/2023   ALBUMIN 3.1 (L) 12/09/2023   AST 14 (L) 12/09/2023   ALT 9 12/09/2023   ALKPHOS 37 (L) 12/09/2023   BILITOT 0.4 12/09/2023   GFRNONAA >60 12/09/2023    Lab Results  Component Value Date   WBC 6.6 12/09/2023   NEUTROABS 4.6 12/09/2023   HGB 8.7 (L) 12/09/2023   HCT 26.7 (L) 12/09/2023   MCV 89.3 12/09/2023   PLT 192 12/09/2023     STUDIES: No results found.  ASSESSMENT: Stage IIIc colon cancer.  PLAN:    Stage IIIc colon cancer: Patient underwent complete surgical resection at [facility] on May 23, 2022.  Large colon mass was noted to partially invade stomach as well as surrounding mesentery.  PET scan did not reveal any metastatic disease other than a positive lung nodule which was confirmed to be a second primary.  Patient completed her final cycle of adjuvant FOLFOX on December 23, 2022.  Patient's CEA trended up to 181, but now has decreased to 56.9.  Today's result is pending.  PET scan results from May 20, 2023 reviewed independently with clear progression of disease.  Patient was seen at [facility] and determined not to be a surgical candidate.  Repeat CT scan on August 24, 2023 reviewed independently with essentially stable disease.  Proceed with cycle 12 of treatment today.  Return to clinic in 2 days for pump removal.  Will repeat imaging with CT scan in 2 weeks with evaluation several days later at which point we will decide whether to continue treatment or patient could possibly have another chemotherapy holiday.   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  Patient completed XRT in May 2023.  PET and CT scan as above with no obvious evidence of recurrence.  Repeat imaging as above. Pain:  Resolved. Anemia: Hemoglobin is proved to 8.7 with recent blood transfusion.  Monitor. Leukopenia: Resolved. Renal insufficiency: Resolved. Neuropathy/falls: Patient does not complain of this today.  Patient was previously given a referral to Occupational Therapy. Coping/anxiety: Patient was previously given a referral to [facility]. Incomplete bladder emptying: Patient does not complain of this today. Poor appetite: Slightly worse  this week secondary to nausea.  Continue Megace  as prescribed. Not: Continue oral antiemetics as prescribed. Weakness and fatigue: Improved.    Patient expressed understanding and was in agreement with this plan. She also understands that She can call clinic at any time with any questions, concerns, or complaints.    Cancer Staging  Colon cancer [facility]) Staging form: Colon and Rectum, AJCC 8th Edition - Pathologic stage from 06/10/2022: Stage IIIC (pT4b, pN1c, cM0) - Signed by Jacobo Evalene PARAS, MD on 06/10/2022 Stage prefix: Initial diagnosis Total positive nodes: 0 Histologic grading system: 4 grade system Histologic grade (G): G3   Evalene PARAS Jacobo, MD   12/09/2023 12:31 PM

## 2023-12-09 NOTE — Patient Instructions (Signed)

## 2023-12-10 LAB — CEA: CEA: 70.1 ng/mL — ABNORMAL HIGH (ref 0.0–4.7)

## 2023-12-11 ENCOUNTER — Inpatient Hospital Stay: Payer: Medicare Other

## 2023-12-11 VITALS — BP 140/83 | HR 98 | Resp 20

## 2023-12-11 DIAGNOSIS — C184 Malignant neoplasm of transverse colon: Secondary | ICD-10-CM

## 2023-12-11 MED ORDER — SODIUM CHLORIDE 0.9% FLUSH
10.0000 mL | INTRAVENOUS | Status: DC | PRN
  Filled 2023-12-11: qty 10

## 2023-12-11 MED ORDER — HEPARIN SOD (PORK) LOCK FLUSH 100 UNIT/ML IV SOLN
500.0000 [IU] | Freq: Once | INTRAVENOUS | Status: DC | PRN
  Filled 2023-12-11: qty 5

## 2023-12-23 ENCOUNTER — Ambulatory Visit: Admission: RE | Admit: 2023-12-23 | Payer: Medicare Other | Source: Ambulatory Visit

## 2023-12-25 ENCOUNTER — Telehealth: Payer: Self-pay | Admitting: *Deleted

## 2023-12-25 NOTE — Telephone Encounter (Signed)
The husband called and said that the patient was unable to get the CT scan because she is nauseated and vomiting. The husband wants to know what is next thing about another appt.I spoke to the patient and she was nausea and vomitied once, she had took a zofran and felt much better today. She will  need to get rescheduled .

## 2023-12-30 ENCOUNTER — Inpatient Hospital Stay: Payer: Medicare Other | Admitting: Oncology

## 2023-12-30 ENCOUNTER — Inpatient Hospital Stay: Payer: Medicare Other

## 2024-01-06 ENCOUNTER — Ambulatory Visit
Admission: RE | Admit: 2024-01-06 | Discharge: 2024-01-06 | Disposition: A | Discharge status: Home / Self Care | Payer: Medicare Other | Source: Ambulatory Visit | Attending: Radiation Oncology | Admitting: Radiation Oncology

## 2024-01-06 DIAGNOSIS — C3432 Malignant neoplasm of lower lobe, left bronchus or lung: Secondary | ICD-10-CM | POA: Insufficient documentation

## 2024-01-06 MED ORDER — HEPARIN SOD (PORK) LOCK FLUSH 100 UNIT/ML IV SOLN
INTRAVENOUS | Status: AC
  Filled 2024-01-06: qty 5

## 2024-01-06 MED ORDER — IOHEXOL 300 MG/ML  SOLN
75.0000 mL | Freq: Once | INTRAMUSCULAR | Status: AC | PRN
  Administered 2024-01-06: 75 mL via INTRAVENOUS

## 2024-01-12 ENCOUNTER — Other Ambulatory Visit: Payer: Self-pay | Admitting: *Deleted

## 2024-01-12 ENCOUNTER — Telehealth: Payer: Self-pay | Admitting: *Deleted

## 2024-01-12 DIAGNOSIS — C184 Malignant neoplasm of transverse colon: Secondary | ICD-10-CM

## 2024-01-12 NOTE — Progress Notes (Signed)
Ct a

## 2024-01-12 NOTE — Telephone Encounter (Signed)
I called the pt. Home and got the husband. I told him that the pt. Only got 1 scan and we needed the ct chest, abdomen and pelvis. So the team had to make another scan on 2/19 with coming in 1:30 for contrast. Then take time to get there results and appt. 2/26 to get flush at 10 am and then see finnegan after that. He is ok with the plan

## 2024-01-13 ENCOUNTER — Inpatient Hospital Stay: Payer: Medicare Other | Attending: Oncology

## 2024-01-13 ENCOUNTER — Inpatient Hospital Stay: Payer: Medicare Other | Admitting: Oncology

## 2024-01-20 ENCOUNTER — Ambulatory Visit
Admission: RE | Admit: 2024-01-20 | Discharge: 2024-01-20 | Disposition: A | Discharge status: Home / Self Care | Payer: Medicare Other | Source: Ambulatory Visit | Attending: Oncology | Admitting: Oncology

## 2024-01-20 DIAGNOSIS — C184 Malignant neoplasm of transverse colon: Secondary | ICD-10-CM | POA: Diagnosis present

## 2024-01-20 MED ORDER — HEPARIN SOD (PORK) LOCK FLUSH 100 UNIT/ML IV SOLN
500.0000 [IU] | Freq: Once | INTRAVENOUS | Status: DC
  Filled 2024-01-20: qty 5

## 2024-01-20 MED ORDER — IOHEXOL 300 MG/ML  SOLN
100.0000 mL | Freq: Once | INTRAMUSCULAR | Status: AC | PRN
  Administered 2024-01-20: 100 mL via INTRAVENOUS

## 2024-01-20 MED ORDER — HEPARIN SOD (PORK) LOCK FLUSH 100 UNIT/ML IV SOLN
INTRAVENOUS | Status: AC
  Filled 2024-01-20: qty 5

## 2024-01-27 ENCOUNTER — Encounter: Payer: Self-pay | Admitting: Oncology

## 2024-01-27 ENCOUNTER — Inpatient Hospital Stay (HOSPITAL_BASED_OUTPATIENT_CLINIC_OR_DEPARTMENT_OTHER): Payer: Medicare Other | Admitting: Oncology

## 2024-01-27 ENCOUNTER — Inpatient Hospital Stay: Payer: Medicare Other | Attending: Oncology

## 2024-01-27 VITALS — BP 139/67 | HR 94 | Temp 98.8°F | Resp 16 | Ht 64.0 in | Wt 126.0 lb

## 2024-01-27 DIAGNOSIS — C184 Malignant neoplasm of transverse colon: Secondary | ICD-10-CM

## 2024-01-27 DIAGNOSIS — D649 Anemia, unspecified: Secondary | ICD-10-CM | POA: Diagnosis not present

## 2024-01-27 DIAGNOSIS — F17290 Nicotine dependence, other tobacco product, uncomplicated: Secondary | ICD-10-CM | POA: Diagnosis not present

## 2024-01-27 DIAGNOSIS — C189 Malignant neoplasm of colon, unspecified: Secondary | ICD-10-CM | POA: Diagnosis present

## 2024-01-27 DIAGNOSIS — R630 Anorexia: Secondary | ICD-10-CM | POA: Insufficient documentation

## 2024-01-27 DIAGNOSIS — Z85118 Personal history of other malignant neoplasm of bronchus and lung: Secondary | ICD-10-CM | POA: Diagnosis not present

## 2024-01-27 DIAGNOSIS — Z800 Family history of malignant neoplasm of digestive organs: Secondary | ICD-10-CM | POA: Insufficient documentation

## 2024-01-27 DIAGNOSIS — G629 Polyneuropathy, unspecified: Secondary | ICD-10-CM | POA: Diagnosis not present

## 2024-01-27 DIAGNOSIS — R110 Nausea: Secondary | ICD-10-CM | POA: Diagnosis not present

## 2024-01-27 DIAGNOSIS — R339 Retention of urine, unspecified: Secondary | ICD-10-CM | POA: Diagnosis not present

## 2024-01-27 DIAGNOSIS — F419 Anxiety disorder, unspecified: Secondary | ICD-10-CM | POA: Insufficient documentation

## 2024-01-27 DIAGNOSIS — Z95828 Presence of other vascular implants and grafts: Secondary | ICD-10-CM

## 2024-01-27 LAB — CBC WITH DIFFERENTIAL/PLATELET
Abs Immature Granulocytes: 0.03 10*3/uL (ref 0.00–0.07)
Basophils Absolute: 0 10*3/uL (ref 0.0–0.1)
Basophils Relative: 1 %
Eosinophils Absolute: 0.2 10*3/uL (ref 0.0–0.5)
Eosinophils Relative: 3 %
HCT: 31.9 % — ABNORMAL LOW (ref 36.0–46.0)
Hemoglobin: 10.1 g/dL — ABNORMAL LOW (ref 12.0–15.0)
Immature Granulocytes: 1 %
Lymphocytes Relative: 16 %
Lymphs Abs: 1 10*3/uL (ref 0.7–4.0)
MCH: 30.2 pg (ref 26.0–34.0)
MCHC: 31.7 g/dL (ref 30.0–36.0)
MCV: 95.5 fL (ref 80.0–100.0)
Monocytes Absolute: 0.6 10*3/uL (ref 0.1–1.0)
Monocytes Relative: 9 %
Neutro Abs: 4.7 10*3/uL (ref 1.7–7.7)
Neutrophils Relative %: 70 %
Platelets: 201 10*3/uL (ref 150–400)
RBC: 3.34 MIL/uL — ABNORMAL LOW (ref 3.87–5.11)
RDW: 15.8 % — ABNORMAL HIGH (ref 11.5–15.5)
WBC: 6.6 10*3/uL (ref 4.0–10.5)
nRBC: 0 % (ref 0.0–0.2)

## 2024-01-27 LAB — CMP (CANCER CENTER ONLY)
ALT: 10 U/L (ref 0–44)
AST: 14 U/L — ABNORMAL LOW (ref 15–41)
Albumin: 3.5 g/dL (ref 3.5–5.0)
Alkaline Phosphatase: 47 U/L (ref 38–126)
Anion gap: 9 (ref 5–15)
BUN: 31 mg/dL — ABNORMAL HIGH (ref 8–23)
CO2: 25 mmol/L (ref 22–32)
Calcium: 8.9 mg/dL (ref 8.9–10.3)
Chloride: 105 mmol/L (ref 98–111)
Creatinine: 0.83 mg/dL (ref 0.44–1.00)
GFR, Estimated: >60 mL/min (ref >60)
Glucose, Bld: 187 mg/dL — ABNORMAL HIGH (ref 70–99)
Potassium: 3.7 mmol/L (ref 3.5–5.1)
Sodium: 139 mmol/L (ref 135–145)
Total Bilirubin: 0.3 mg/dL (ref 0.0–1.2)
Total Protein: 6.7 g/dL (ref 6.5–8.1)

## 2024-01-27 LAB — SAMPLE TO BLOOD BANK

## 2024-01-27 MED ORDER — SODIUM CHLORIDE 0.9% FLUSH
10.0000 mL | Freq: Once | INTRAVENOUS | Status: AC
  Administered 2024-01-27: 10 mL via INTRAVENOUS
  Filled 2024-01-27: qty 10

## 2024-01-27 MED ORDER — HEPARIN SOD (PORK) LOCK FLUSH 100 UNIT/ML IV SOLN
500.0000 [IU] | Freq: Once | INTRAVENOUS | Status: AC
  Administered 2024-01-27: 500 [IU] via INTRAVENOUS
  Filled 2024-01-27: qty 5

## 2024-01-27 NOTE — Progress Notes (Signed)
 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  INTERVAL HISTORY: Patient returns to clinic today for further evaluation and discussion of her CT scan results.  She continues to have nausea in the morning, but this typically resolves with treatment.  She continues to have chronic weakness and fatigue.  She has a chronic peripheral neuropathy, but no other neurologic complaints.  She denies any recent fevers or illnesses.  She has no chest pain, shortness of breath, cough, or hemoptysis.  She has no abdominal pain.  She denies any vomiting, constipation, or diarrhea.  She has no melena or hematochezia.  She has no urinary complaints.  Patient offers no further specific complaints today.  REVIEW OF SYSTEMS:   Review of Systems  Constitutional:  Positive for malaise/fatigue. Negative for fever and weight loss.  Respiratory: Negative.  Negative for cough, hemoptysis and shortness of breath.   Cardiovascular: Negative.  Negative for chest pain and leg swelling.  Gastrointestinal:  Positive for nausea. Negative for abdominal pain, blood in stool, constipation, diarrhea, melena and vomiting.  Genitourinary: Negative.  Negative for dysuria.  Musculoskeletal: Negative.  Negative for back pain and falls.  Skin: Negative.  Negative for rash.  Neurological:  Positive for sensory change and weakness. Negative for dizziness, focal weakness and headaches.  Psychiatric/Behavioral: Negative.  The patient is not nervous/anxious.      As per HPI. Otherwise, a complete review of systems is negative.  PAST MEDICAL HISTORY: Past Medical History:  Diagnosis Date   Anemia    Anxiety    Asthma    Cancer (HCC)    colon   CHF (congestive heart failure) (HCC)    COPD (chronic obstructive pulmonary disease) (HCC)    Diabetes mellitus without complication (HCC)    Hyperlipidemia    Hypertension    Stroke [facility])     PAST SURGICAL HISTORY: Past Surgical History:  Procedure Laterality Date   BIOPSY  06/26/2023   Procedure: BIOPSY;  Surgeon: Regis Bill, MD;  Location: [facility] ENDOSCOPY;  Service: Endoscopy;;   COLONOSCOPY N/A 06/26/2023   Procedure: COLONOSCOPY;  Surgeon: Regis Bill, MD;  Location: [facility] ENDOSCOPY;  Service: Endoscopy;  Laterality: N/A;   COLONOSCOPY WITH PROPOFOL N/A 02/03/2022   Procedure: COLONOSCOPY WITH PROPOFOL;  Surgeon: Toney Reil, MD;  Location: [facility] ENDOSCOPY;  Service: Gastroenterology;  Laterality: N/A;   ESOPHAGOGASTRODUODENOSCOPY N/A 06/26/2023   Procedure: ESOPHAGOGASTRODUODENOSCOPY (EGD);  Surgeon: Regis Bill, MD;  Location: [facility] ENDOSCOPY;  Service: Endoscopy;  Laterality: N/A;   ESOPHAGOGASTRODUODENOSCOPY (EGD) WITH PROPOFOL N/A 02/03/2022   Procedure: ESOPHAGOGASTRODUODENOSCOPY (EGD) WITH PROPOFOL;  Surgeon: Toney Reil, MD;  Location: [facility] ENDOSCOPY;  Service: Gastroenterology;  Laterality: N/A;   GIVENS CAPSULE STUDY N/A 02/03/2022   Procedure: GIVENS CAPSULE STUDY;  Surgeon: Toney Reil, MD;  Location: [facility] ENDOSCOPY;  Service: Gastroenterology;  Laterality: N/A;  possible Capsule Study   IR IMAGING GUIDED PORT INSERTION  06/18/2022   POLYPECTOMY  06/26/2023   Procedure: POLYPECTOMY;  Surgeon: Regis Bill, MD;  Location: [facility] ENDOSCOPY;  Service: Endoscopy;;    FAMILY  HISTORY: Family History  Problem Relation Age of Onset   Diabetes Mother    Heart attack Father    Colon cancer Father     ADVANCED DIRECTIVES (Y/N):  N  HEALTH  MAINTENANCE: Social History   Tobacco Use   Smoking status: Former    Types: E-cigarettes    Quit date: 12/08/2022    Years since quitting: 1.1   Tobacco comments:    Quit Cigarettes 12/2021  Vaping Use   Vaping status: Every Day   Substances: Nicotine, THC  Substance Use Topics   Alcohol use: Yes    Comment: rare   Drug use: Yes    Types: Marijuana     Colonoscopy:  PAP:  Bone density:  Lipid panel:  Allergies  Allergen Reactions   Codeine Rash    Happened in 1970s    Current Outpatient Medications  Medication Sig Dispense Refill   acetaminophen (TYLENOL) 325 MG tablet Take 650 mg by mouth every 6 (six) hours as needed.     albuterol (VENTOLIN HFA) 108 (90 Base) MCG/ACT inhaler Inhale into the lungs.     atorvastatin (LIPITOR) 40 MG tablet Take 40 mg by mouth daily.     clonazePAM (KLONOPIN) 1 MG tablet Take 1 mg by mouth 2 (two) times daily.     fenofibrate 54 MG tablet Take 54 mg by mouth daily.     gabapentin (NEURONTIN) 600 MG tablet Take by mouth 2 (two) times daily.     LANTUS SOLOSTAR 100 UNIT/ML Solostar Pen Inject 12 Units into the skin at bedtime.     lisinopril (ZESTRIL) 20 MG tablet Take 20 mg by mouth daily.     megestrol (MEGACE) 40 MG tablet Take 1 tablet (40 mg total) by mouth daily. 30 tablet 0   metoprolol succinate (TOPROL-XL) 50 MG 24 hr tablet Take 50 mg by mouth daily. Take with or immediately following a meal.     OLANZapine (ZYPREXA) 10 MG tablet Take 0.5-1 tablets (5-10 mg total) by mouth at bedtime as needed (nausea). 30 tablet 1   ondansetron (ZOFRAN) 8 MG tablet Take 1 tablet (8 mg total) by mouth every 8 (eight) hours as needed for nausea or vomiting. 60 tablet 2   ranolazine (RANEXA) 500 MG 12 hr tablet Take 500 mg by mouth 2 (two) times daily.     sitaGLIPtin-metformin (JANUMET) 50-1000 MG tablet Take 1 tablet by mouth daily.     venlafaxine XR (EFFEXOR-XR) 75 MG 24 hr capsule Take 75 mg by mouth daily.     insulin lispro (HUMALOG) 100  UNIT/ML injection Inject 1-10 Units into the skin 3 (three) times daily before meals. Per sliding scale (Patient not taking: Reported on 08/25/2023)     Magnesium Citrate 200 MG TABS Take 1 tablet by mouth in the morning and at bedtime. 250 twice daily (Patient not taking: Reported on 11/18/2023)     pantoprazole (PROTONIX) 40 MG tablet Take 1 tablet (40 mg total) by mouth daily. 30 tablet 5   No current facility-administered medications for this visit.    OBJECTIVE: Vitals:   01/27/24 1011  BP: 139/67  Pulse: 94  Resp: 16  Temp: 98.8 F (37.1 C)  SpO2: 96%       Body mass index is 21.63 kg/m.    ECOG FS:1 - Symptomatic but completely ambulatory  General: Well-developed, well-nourished, no acute distress.  Sitting in a wheelchair. Eyes: Pink conjunctiva, anicteric sclera. HEENT: Normocephalic, moist mucous membranes. Lungs: No audible wheezing or coughing.  Heart: Regular rate and rhythm. Abdomen: Soft, nontender, no obvious distention. Musculoskeletal: No edema, cyanosis, or clubbing. Neuro: Alert, answering all questions appropriately. Cranial nerves grossly intact. Skin: No rashes or petechiae noted. Psych: Normal affect.   LAB RESULTS:  Lab Results  Component Value Date   NA 139 01/27/2024   K 3.7 01/27/2024   CL 105 01/27/2024   CO2 25 01/27/2024   GLUCOSE 187 (H) 01/27/2024   BUN 31 (H) 01/27/2024   CREATININE 0.83 01/27/2024   CALCIUM 8.9 01/27/2024   PROT 6.7 01/27/2024   ALBUMIN 3.5 01/27/2024   AST 14 (L) 01/27/2024   ALT 10 01/27/2024   ALKPHOS 47 01/27/2024   BILITOT 0.3 01/27/2024   GFRNONAA >60 01/27/2024    Lab Results  Component Value Date   WBC 6.6 01/27/2024   NEUTROABS 4.7 01/27/2024   HGB 10.1 (L) 01/27/2024   HCT 31.9 (L) 01/27/2024   MCV 95.5 01/27/2024   PLT 201 01/27/2024     STUDIES: CT ABDOMEN PELVIS W CONTRAST Result Date: 01/21/2024 CLINICAL DATA:  Colon cancer, assess treatment response. History of non-small cell lung  cancer. * Tracking Code: BO * EXAM: CT ABDOMEN AND PELVIS WITH CONTRAST TECHNIQUE: Multidetector CT imaging of the abdomen and pelvis was performed using the standard protocol following bolus administration of intravenous contrast. RADIATION DOSE REDUCTION: This exam was performed according to the departmental dose-optimization program which includes automated exposure control, adjustment of the mA and/or kV according to patient size and/or use of iterative reconstruction technique. CONTRAST:  OMNIPAQUE IOHEXOL 300 MG/ML  SOLN COMPARISON:  Abdominopelvic CT 08/24/2023. PET-CT 05/15/2023. Recent chest CT 01/06/2024. FINDINGS: Lower chest:  Deferred to recent chest CT.  No acute findings. Hepatobiliary: The liver is normal in density without suspicious focal abnormality. There is a stable cystic lesion inferiorly in the right hepatic lobe measuring 6 mm on image 31/2. Small calcified gallstone noted. No gallbladder wall thickening or biliary dilatation. Pancreas: Unremarkable. No pancreatic ductal dilatation or surrounding inflammatory changes. Spleen: Normal in size without focal abnormality. 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 The left adrenal nodule measures 3.5 x 2.2 cm and has a fatty component measuring 1.4 cm. No evidence of urinary tract calculus, suspicious renal lesion or hydronephrosis. Stable 1.2 cm cyst in the upper pole of the right kidney for which no specific follow-up imaging is recommended. The bladder appears unremarkable for its degree of distention. Stomach/Bowel: Enteric contrast has passed through the ileocolonic anastomosis. Probable postsurgical changes in the stomach with interval increased distal gastric wall thickening. The focal fluid collection previously demonstrated inferiorly in the gastric wall has resolved. No significant small bowel distension status post right  hemicolectomy and anastomosis. The anastomosis is patent. However, there is an enlarging thick-walled, necrotic appearing a collection adjacent to the anastomosis measuring 4.3 x 3.1 cm on image 47/2 (previously 2.8 x 2.5 cm). The previously demonstrated ill-defined soft tissue mass inferior to this collection associated with the small bowel is less discrete on the current study, although there is residual irregular small bowel wall thickening in this area which appears nearly contiguous with the adjacent descending colon (axial image 61/2). There are diverticular changes within the remaining distal colon. Vascular/Lymphatic: Mildly prominent small mesenteric lymph node adjacent to the enterocolonic anastomosis, measuring 6 mm on image 49/2. No enlarged abdominopelvic lymph nodes are identified. Aortic and branch vessel atherosclerosis without evidence of aneurysm or large vessel occlusion. Reproductive: Stable appearance  of the retroverted uterus with a probable small fundal fibroid. No suspicious adnexal findings. Other: Postsurgical changes in the anterior abdominal wall status post ventral hernia repair. The adjacent bowel loops appear somewhat matted in this area, although no recurrent hernia, ascites or peritoneal nodularity is identified. No pneumoperitoneum. Musculoskeletal: Lower right-sided rib fractures as seen on recent chest CT. No acute osseous findings identified in the abdomen or pelvis. There is multilevel spondylosis associated with a mild convex right lumbar scoliosis. IMPRESSION: 1. Enlarging thick-walled, necrotic appearing collection adjacent to the enterocolonic anastomosis, suspicious for local recurrence. The previously demonstrated ill-defined soft tissue mass inferior to this collection associated with the small bowel is less discrete on the current study, although there is residual irregular small bowel wall thickening in this area which appears nearly contiguous with the adjacent  descending colon. There is also increased distal gastric wall thickening. Consider further evaluation with follow-up PET-CT. 2. Mildly prominent small mesenteric lymph node adjacent to the enterocolonic anastomosis, nonspecific. No enlarged abdominopelvic lymph nodes are identified. 3. No evidence of distant metastatic disease. 4. 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 5. Cholelithiasis. 6.  Aortic Atherosclerosis (ICD10-I70.0). Electronically Signed   By: Carey Bullocks M.D.   On: 01/21/2024 08:49   CT Chest W Contrast Result Date: 01/21/2024 CLINICAL DATA:  Non-small cell lung cancer, assess treatment response. * Tracking Code: BO * EXAM: CT CHEST WITH CONTRAST TECHNIQUE: Multidetector CT imaging of the chest was performed during intravenous contrast administration. RADIATION DOSE REDUCTION: This exam was performed according to the departmental dose-optimization program which includes automated exposure control, adjustment of the mA and/or kV according to patient size and/or use of iterative reconstruction technique. CONTRAST:  75mL OMNIPAQUE IOHEXOL 300 MG/ML  SOLN COMPARISON:  Chest CT 08/24/2023 and 04/30/2023.  PET-CT 05/15/2023. FINDINGS: Cardiovascular: No acute vascular findings are identified. Right IJ Port-A-Cath extends to the superior cavoatrial junction. There is atherosclerosis of the aorta, great vessels and coronary arteries. The heart size is normal. There is no pericardial effusion. Mediastinum/Nodes: There are no enlarged mediastinal, hilar or axillary lymph nodes. The thyroid gland, trachea and esophagus demonstrate no significant findings. Lungs/Pleura: No pleural effusion or pneumothorax. Mild to moderate centrilobular and paraseptal emphysema with stable linear left lung scarring. Stable probable nodular scarring posteriorly in the left upper lobe measuring up to 4 mm on image 39/4. No new or enlarging nodules.  Upper abdomen: The visualized upper abdomen appears stable, without acute findings. Unchanged bilateral adrenal nodules for which no specific follow-up imaging recommended. See separate abdominal CT report. Musculoskeletal/Chest wall: There are new fractures of the right 5th, 6th and 7th ribs laterally. The 7th rib fractures minimally displaced. No aggressive osseous lesions. IMPRESSION: 1. No evidence of local recurrence or metastatic disease in the chest. 2. New fractures of the right 5th, 6th and 7th ribs laterally. 3. Aortic Atherosclerosis (ICD10-I70.0) and Emphysema (ICD10-J43.9). Electronically Signed   By: Carey Bullocks M.D.   On: 01/21/2024 08:28   ONCOLOGY HISTORY: Patient underwent complete surgical resection at [facility] on May 23, 2022.  Large colon mass was noted to partially invade stomach as well as surrounding mesentery.  PET scan did not reveal any metastatic disease other than a positive lung nodule which was confirmed to be a second primary.  Patient completed her final cycle of adjuvant FOLFOX on December 23, 2022.  PET scan results from May 20, 2023 with clear progression of disease.  Patient was seen  at [facility] and determined not to be a surgical candidate.  Patient then received 12 cycles of FOLFIRI between June 09, 2023 and March 08, 2024.   ASSESSMENT: Stage IIIc colon cancer.  PLAN:    Stage IIIc colon cancer: Prior to starting treatment with FOLFIRI, patient's CEA trended up to 181.0.  This decreased to 56.9 on November 18, 2023, but more recently has trended up slightly to 70.1.  Her most recent CT scan results reviewed independently with possible progression of disease, but also a significant amount of necrotic tissue.  Patient will require PET scan to determine whether she has active disease or not.  She will have a video-assisted telemedicine visit after her PET scan to discuss the results and whether or not additional treatment is necessary.   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  Patient completed XRT in May 2023.  PET and CT scan as above with no obvious evidence of recurrence.  Patient will require repeat chest imaging in approximately August 2025.   Anemia: Hemoglobin is trended up to 10.1.  Monitor. Peripheral neuropathy: Chronic and unchanged.  Patient has had no recent falls.  She was previously given a referral to Occupational Therapy. Coping/anxiety: Patient was previously given a referral to [facility]. Incomplete bladder emptying: Patient does not complain of this today. Poor appetite: Improved.   Nausea: Continue oral antiemetics as prescribed.  Patient expressed understanding and was in agreement with this plan. She also understands that She can call clinic at any time with any questions, concerns, or complaints.    Cancer Staging  Colon cancer [facility]) Staging form: Colon and Rectum, AJCC 8th Edition - Pathologic stage from 06/10/2022: Stage IIIC (pT4b, pN1c, cM0) - Signed by Jeralyn Ruths, MD on 06/10/2022 Stage prefix: Initial diagnosis Total positive nodes: 0 Histologic grading system: 4 grade system Histologic grade (G): G3   Jeralyn Ruths, MD   01/27/2024 10:49 AM

## 2024-01-28 LAB — CEA: CEA: 127 ng/mL — ABNORMAL HIGH (ref 0.0–4.7)

## 2024-01-29 LAB — POCT I-STAT CREATININE: Creatinine, Ser: 0.8 mg/dL (ref 0.44–1.00)

## 2024-02-02 ENCOUNTER — Ambulatory Visit
Admission: RE | Admit: 2024-02-02 | Discharge: 2024-02-02 | Disposition: A | Discharge status: Home / Self Care | Payer: Medicare Other | Source: Ambulatory Visit | Attending: Oncology | Admitting: Oncology

## 2024-02-02 DIAGNOSIS — I700 Atherosclerosis of aorta: Secondary | ICD-10-CM | POA: Diagnosis not present

## 2024-02-02 DIAGNOSIS — C184 Malignant neoplasm of transverse colon: Secondary | ICD-10-CM | POA: Insufficient documentation

## 2024-02-02 DIAGNOSIS — K8020 Calculus of gallbladder without cholecystitis without obstruction: Secondary | ICD-10-CM | POA: Diagnosis not present

## 2024-02-02 LAB — GLUCOSE, CAPILLARY: Glucose-Capillary: 94 mg/dL (ref 70–99)

## 2024-02-02 MED ORDER — FLUDEOXYGLUCOSE F - 18 (FDG) INJECTION
6.5000 | Freq: Once | INTRAVENOUS | Status: AC | PRN
  Administered 2024-02-02: 7.14 via INTRAVENOUS

## 2024-02-05 ENCOUNTER — Inpatient Hospital Stay: Payer: Medicare Other | Admitting: Oncology

## 2024-02-10 ENCOUNTER — Inpatient Hospital Stay: Attending: Oncology | Admitting: Oncology

## 2024-02-10 ENCOUNTER — Encounter: Payer: Self-pay | Admitting: Oncology

## 2024-02-10 VITALS — BP 134/82 | HR 98 | Temp 97.7°F | Resp 16 | Ht 64.0 in | Wt 123.0 lb

## 2024-02-10 DIAGNOSIS — Z85118 Personal history of other malignant neoplasm of bronchus and lung: Secondary | ICD-10-CM | POA: Diagnosis not present

## 2024-02-10 DIAGNOSIS — Z923 Personal history of irradiation: Secondary | ICD-10-CM | POA: Diagnosis not present

## 2024-02-10 DIAGNOSIS — D649 Anemia, unspecified: Secondary | ICD-10-CM | POA: Diagnosis not present

## 2024-02-10 DIAGNOSIS — F17290 Nicotine dependence, other tobacco product, uncomplicated: Secondary | ICD-10-CM | POA: Diagnosis not present

## 2024-02-10 DIAGNOSIS — F419 Anxiety disorder, unspecified: Secondary | ICD-10-CM | POA: Diagnosis not present

## 2024-02-10 DIAGNOSIS — Z800 Family history of malignant neoplasm of digestive organs: Secondary | ICD-10-CM | POA: Diagnosis not present

## 2024-02-10 DIAGNOSIS — C184 Malignant neoplasm of transverse colon: Secondary | ICD-10-CM

## 2024-02-10 DIAGNOSIS — R339 Retention of urine, unspecified: Secondary | ICD-10-CM | POA: Insufficient documentation

## 2024-02-10 DIAGNOSIS — G629 Polyneuropathy, unspecified: Secondary | ICD-10-CM | POA: Insufficient documentation

## 2024-02-10 DIAGNOSIS — R630 Anorexia: Secondary | ICD-10-CM | POA: Diagnosis not present

## 2024-02-10 DIAGNOSIS — C189 Malignant neoplasm of colon, unspecified: Secondary | ICD-10-CM | POA: Diagnosis present

## 2024-02-10 NOTE — Progress Notes (Signed)
 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  INTERVAL HISTORY: Patient returns to clinic today for further evaluation and discussion of her PET scan results.  She continues to have weakness and fatigue, but states this is improving.  She does not complain of nausea today.  She has a chronic peripheral neuropathy, but no other neurologic complaints.  She denies any recent fevers or illnesses.  She has no chest pain, shortness of breath, cough, or hemoptysis.  She has no abdominal pain.  She denies any vomiting, constipation, or diarrhea.  She has no melena or hematochezia.  She has no urinary complaints.  Patient offers no further specific complaints today.  REVIEW OF SYSTEMS:   Review of Systems  Constitutional:  Positive for malaise/fatigue. Negative for fever and weight loss.  Respiratory: Negative.  Negative for cough, hemoptysis and shortness of breath.   Cardiovascular: Negative.  Negative for chest pain and leg swelling.  Gastrointestinal: Negative.  Negative for abdominal pain, blood in stool, constipation, diarrhea, melena, nausea and vomiting.  Genitourinary: Negative.  Negative for dysuria.  Musculoskeletal: Negative.  Negative for back pain and falls.  Skin: Negative.  Negative for rash.  Neurological:  Positive for sensory change and weakness. Negative for dizziness, focal weakness and headaches.  Psychiatric/Behavioral: Negative.  The patient is not nervous/anxious.     As per HPI. Otherwise,  a complete review of systems is negative.  PAST MEDICAL HISTORY: Past Medical History:  Diagnosis Date   Anemia    Anxiety    Asthma    Cancer (HCC)    colon   CHF (congestive heart failure) (HCC)    COPD (chronic obstructive pulmonary disease) (HCC)    Diabetes mellitus without complication (HCC)    Hyperlipidemia    Hypertension    Stroke [facility])     PAST SURGICAL HISTORY: Past Surgical History:  Procedure Laterality Date   BIOPSY  06/26/2023   Procedure: BIOPSY;  Surgeon: Regis Bill, MD;  Location: [facility] ENDOSCOPY;  Service: Endoscopy;;   COLONOSCOPY N/A 06/26/2023   Procedure: COLONOSCOPY;  Surgeon: Regis Bill, MD;  Location: [facility] ENDOSCOPY;  Service: Endoscopy;  Laterality: N/A;   COLONOSCOPY WITH PROPOFOL N/A 02/03/2022   Procedure: COLONOSCOPY WITH PROPOFOL;  Surgeon: Toney Reil, MD;  Location: [facility] ENDOSCOPY;  Service: Gastroenterology;  Laterality: N/A;   ESOPHAGOGASTRODUODENOSCOPY N/A 06/26/2023   Procedure: ESOPHAGOGASTRODUODENOSCOPY (EGD);  Surgeon: Regis Bill, MD;  Location: [facility] ENDOSCOPY;  Service: Endoscopy;  Laterality: N/A;   ESOPHAGOGASTRODUODENOSCOPY (EGD) WITH PROPOFOL N/A 02/03/2022   Procedure: ESOPHAGOGASTRODUODENOSCOPY (EGD) WITH PROPOFOL;  Surgeon: Toney Reil, MD;  Location: [facility] ENDOSCOPY;  Service: Gastroenterology;  Laterality: N/A;   GIVENS CAPSULE STUDY N/A 02/03/2022   Procedure: GIVENS CAPSULE STUDY;  Surgeon: Toney Reil, MD;  Location: [facility] ENDOSCOPY;  Service: Gastroenterology;  Laterality: N/A;  possible Capsule Study   IR IMAGING GUIDED PORT INSERTION  06/18/2022   POLYPECTOMY  06/26/2023   Procedure: POLYPECTOMY;  Surgeon: Regis Bill, MD;  Location: [facility] ENDOSCOPY;  Service: Endoscopy;;    FAMILY HISTORY: Family History  Problem Relation Age of Onset   Diabetes Mother    Heart attack Father    Colon cancer Father     ADVANCED DIRECTIVES (Y/N):  N  HEALTH MAINTENANCE: Social History    Tobacco Use   Smoking status: Former    Types: E-cigarettes    Quit date: 12/08/2022    Years since quitting: 1.1   Tobacco comments:    Quit Cigarettes 12/2021  Vaping Use   Vaping status: Every Day   Substances: Nicotine, THC  Substance Use Topics   Alcohol use: Yes    Comment: rare   Drug use: Yes    Types: Marijuana     Colonoscopy:  PAP:  Bone density:  Lipid panel:  Allergies  Allergen Reactions   Codeine Rash    Happened in 1970s    Current Outpatient Medications  Medication Sig Dispense Refill   acetaminophen (TYLENOL) 325 MG tablet Take 650 mg by mouth every 6 (six) hours as needed.     albuterol (VENTOLIN HFA) 108 (90 Base) MCG/ACT inhaler Inhale into the lungs.     atorvastatin (LIPITOR) 40 MG tablet Take 40 mg by mouth daily.     clonazePAM (KLONOPIN) 1 MG tablet Take 1 mg by mouth 2 (two) times daily.     fenofibrate 54 MG tablet Take 54 mg by mouth daily.     gabapentin (NEURONTIN) 600 MG tablet Take by mouth 2 (two) times daily.     LANTUS SOLOSTAR 100 UNIT/ML Solostar Pen Inject 12 Units into the skin at bedtime.     lisinopril (ZESTRIL) 20 MG tablet Take 20 mg by mouth daily.     megestrol (MEGACE) 40 MG tablet Take 1 tablet (40 mg total) by mouth daily. 30 tablet 0   metoprolol succinate (TOPROL-XL) 50 MG 24 hr tablet Take 50 mg by mouth daily. Take with or immediately following a meal.     OLANZapine (ZYPREXA) 10 MG tablet Take 0.5-1 tablets (5-10 mg total) by mouth at bedtime as needed (nausea). 30 tablet 1   ondansetron (ZOFRAN) 8 MG tablet Take 1 tablet (8 mg total) by mouth every 8 (eight) hours as needed for nausea or vomiting. 60 tablet 2   ranolazine (RANEXA) 500 MG 12 hr tablet Take 500 mg by mouth 2 (two) times daily.     sitaGLIPtin-metformin (JANUMET) 50-1000 MG tablet Take 1 tablet by mouth daily.     venlafaxine XR (EFFEXOR-XR) 75 MG 24 hr capsule Take 75 mg by mouth daily.     insulin lispro (HUMALOG) 100 UNIT/ML injection Inject 1-10  Units into the skin 3 (three) times daily before meals. Per sliding scale (Patient not taking: Reported on 08/25/2023)     Magnesium Citrate 200 MG TABS Take 1 tablet by mouth in the morning and at bedtime. 250 twice daily (Patient not taking: Reported on 02/10/2024)     pantoprazole (PROTONIX) 40 MG tablet Take 1 tablet (40 mg total) by mouth daily. 30 tablet 5   No current facility-administered medications for this visit.    OBJECTIVE: Vitals:   02/10/24 1341  BP: 134/82  Pulse: 98  Resp: 16  Temp: 97.7 F (36.5 C)  SpO2: 95%        Body mass index is 21.11 kg/m.    ECOG FS:1 - Symptomatic but completely ambulatory  General: Well-developed, well-nourished, no acute distress.  Sitting in a wheelchair. Eyes: Pink conjunctiva, anicteric sclera. HEENT: Normocephalic, moist mucous membranes. Lungs: No audible wheezing or coughing. Heart: Regular rate  and rhythm. Abdomen: Soft, nontender, no obvious distention. Musculoskeletal: No edema, cyanosis, or clubbing. Neuro: Alert, answering all questions appropriately. Cranial nerves grossly intact. Skin: No rashes or petechiae noted. Psych: Normal affect.  LAB RESULTS:  Lab Results  Component Value Date   NA 139 01/27/2024   K 3.7 01/27/2024   CL 105 01/27/2024   CO2 25 01/27/2024   GLUCOSE 187 (H) 01/27/2024   BUN 31 (H) 01/27/2024   CREATININE 0.83 01/27/2024   CALCIUM 8.9 01/27/2024   PROT 6.7 01/27/2024   ALBUMIN 3.5 01/27/2024   AST 14 (L) 01/27/2024   ALT 10 01/27/2024   ALKPHOS 47 01/27/2024   BILITOT 0.3 01/27/2024   GFRNONAA >60 01/27/2024    Lab Results  Component Value Date   WBC 6.6 01/27/2024   NEUTROABS 4.7 01/27/2024   HGB 10.1 (L) 01/27/2024   HCT 31.9 (L) 01/27/2024   MCV 95.5 01/27/2024   PLT 201 01/27/2024     STUDIES: NM PET Image Restag (PS) Skull Base To Thigh Result Date: 02/02/2024 CLINICAL DATA:  Subsequent treatment strategy for colorectal carcinoma. EXAM: NUCLEAR MEDICINE PET SKULL BASE  TO THIGH TECHNIQUE: 7.14 mCi F-18 FDG was injected intravenously. Full-ring PET imaging was performed from the skull base to thigh after the radiotracer. CT data was obtained and used for attenuation correction and anatomic localization. Fasting blood glucose: 94 mg/dl COMPARISON:  CT 16/09/9603, PET-CT 05/15/2023 FINDINGS: Choose one NECK: No hypermetabolic lymph nodes in the neck. Incidental CT findings: None. CHEST: No hypermetabolic mediastinal or hilar nodes. No suspicious pulmonary nodules on the CT scan. Incidental CT findings: None. ABDOMEN/PELVIS: There is intense metabolic activity throughout the small bowel: Which makes it difficult to assess for recurrence within the bowel above the background physiologic activity. Enteric colonic anastomosis in the LEFT mid abdomen. Clear abnormal fluid collections collections remain. There is hypermetabolic activity throughout the gastric fundus and body without focality. No hypermetabolic abdominal lymph nodes. No hypermetabolic peritoneal implants. No free fluid. Incidental CT findings: A ventral abdominal mesh. Atherosclerotic calcification of the aorta. Small gallstone. SKELETON: No focal hypermetabolic activity to suggest skeletal metastasis. Incidental CT findings: None. IMPRESSION: 1. No clear evidence of colorectal cancer recurrence or metastasis. 2. Intense physiologic activity throughout the small bowel and without focality to localize recurrent disease. Recurrent disease could be masked by the intense physiologic activity. 3. Intense activity throughout the entire stomach is nonspecific. Electronically Signed   By: Genevive Bi M.D.   On: 02/02/2024 14:28   CT ABDOMEN PELVIS W CONTRAST Result Date: 01/21/2024 CLINICAL DATA:  Colon cancer, assess treatment response. History of non-small cell lung cancer. * Tracking Code: BO * EXAM: CT ABDOMEN AND PELVIS WITH CONTRAST TECHNIQUE: Multidetector CT imaging of the abdomen and pelvis was performed using the  standard protocol following bolus administration of intravenous contrast. RADIATION DOSE REDUCTION: This exam was performed according to the departmental dose-optimization program which includes automated exposure control, adjustment of the mA and/or kV according to patient size and/or use of iterative reconstruction technique. CONTRAST:  OMNIPAQUE IOHEXOL 300 MG/ML  SOLN COMPARISON:  Abdominopelvic CT 08/24/2023. PET-CT 05/15/2023. Recent chest CT 01/06/2024. FINDINGS: Lower chest:  Deferred to recent chest CT.  No acute findings. Hepatobiliary: The liver is normal in density without suspicious focal abnormality. There is a stable cystic lesion inferiorly in the right hepatic lobe measuring 6 mm on image 31/2. Small calcified gallstone noted. No gallbladder wall thickening or biliary dilatation. Pancreas: Unremarkable. No pancreatic ductal dilatation or surrounding inflammatory  changes. Spleen: Normal in size without focal abnormality. 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 The left adrenal nodule measures 3.5 x 2.2 cm and has a fatty component measuring 1.4 cm. No evidence of urinary tract calculus, suspicious renal lesion or hydronephrosis. Stable 1.2 cm cyst in the upper pole of the right kidney for which no specific follow-up imaging is recommended. The bladder appears unremarkable for its degree of distention. Stomach/Bowel: Enteric contrast has passed through the ileocolonic anastomosis. Probable postsurgical changes in the stomach with interval increased distal gastric wall thickening. The focal fluid collection previously demonstrated inferiorly in the gastric wall has resolved. No significant small bowel distension status post right hemicolectomy and anastomosis. The anastomosis is patent. However, there is an enlarging thick-walled, necrotic appearing a collection adjacent to the anastomosis  measuring 4.3 x 3.1 cm on image 47/2 (previously 2.8 x 2.5 cm). The previously demonstrated ill-defined soft tissue mass inferior to this collection associated with the small bowel is less discrete on the current study, although there is residual irregular small bowel wall thickening in this area which appears nearly contiguous with the adjacent descending colon (axial image 61/2). There are diverticular changes within the remaining distal colon. Vascular/Lymphatic: Mildly prominent small mesenteric lymph node adjacent to the enterocolonic anastomosis, measuring 6 mm on image 49/2. No enlarged abdominopelvic lymph nodes are identified. Aortic and branch vessel atherosclerosis without evidence of aneurysm or large vessel occlusion. Reproductive: Stable appearance of the retroverted uterus with a probable small fundal fibroid. No suspicious adnexal findings. Other: Postsurgical changes in the anterior abdominal wall status post ventral hernia repair. The adjacent bowel loops appear somewhat matted in this area, although no recurrent hernia, ascites or peritoneal nodularity is identified. No pneumoperitoneum. Musculoskeletal: Lower right-sided rib fractures as seen on recent chest CT. No acute osseous findings identified in the abdomen or pelvis. There is multilevel spondylosis associated with a mild convex right lumbar scoliosis. IMPRESSION: 1. Enlarging thick-walled, necrotic appearing collection adjacent to the enterocolonic anastomosis, suspicious for local recurrence. The previously demonstrated ill-defined soft tissue mass inferior to this collection associated with the small bowel is less discrete on the current study, although there is residual irregular small bowel wall thickening in this area which appears nearly contiguous with the adjacent descending colon. There is also increased distal gastric wall thickening. Consider further evaluation with follow-up PET-CT. 2. Mildly prominent small mesenteric lymph  node adjacent to the enterocolonic anastomosis, nonspecific. No enlarged abdominopelvic lymph nodes are identified. 3. No evidence of distant metastatic disease. 4. 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 5. Cholelithiasis. 6.  Aortic Atherosclerosis (ICD10-I70.0). Electronically Signed   By: Carey Bullocks M.D.   On: 01/21/2024 08:49   ONCOLOGY HISTORY: Patient underwent complete surgical resection at [facility] on May 23, 2022.  Large colon mass was noted to partially invade stomach as well as surrounding mesentery.  PET scan did not reveal any metastatic disease other than a positive lung nodule which was confirmed to be a second primary.  Patient completed her final cycle of adjuvant FOLFOX on December 23, 2022.  PET scan results from May 20, 2023 with clear progression of disease.  Patient was seen at [facility] and determined not to be a surgical candidate.  Patient then received 12 cycles of FOLFIRI between June 09, 2023 and March 08, 2024.   ASSESSMENT: Stage IIIc colon cancer.  PLAN:  Stage IIIc colon cancer: Patient received cycle 12 of FOLFIRI on December 09, 2023.  Her most recent CT scan results reviewed independently with possible progression of disease, but also a significant amount of necrotic tissue.  Patient's PET scan on February 02, 2024 did not reveal any clear evidence of disease.  Despite this, her CEA has trended up from 56.9-127 between November 18, 2023 and January 27, 2024.  Patient expressed understanding that she will likely have to reinitiate treatment, but not at this time.  Repeat laboratory work in 6 weeks and then patient will return to clinic in 3 months with repeat imaging using PET scan, laboratory work, and further evaluation.    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  Patient completed XRT in May 2023.  PET and CT  scan as above with no obvious evidence of recurrence.  Patient will require repeat chest imaging in approximately August 2025.   Anemia: Patient's most recent hemoglobin was 10.1.  Monitor. Peripheral neuropathy: Chronic and unchanged.  Patient has had no recent falls.  She was previously given a referral to Occupational Therapy. Coping/anxiety: Patient was previously given a referral to [facility]. Incomplete bladder emptying: Patient does not complain of this today. Poor appetite: Improved.   Nausea: Patient does not complain of this today.  Continue oral antiemetics as prescribed.  Patient expressed understanding and was in agreement with this plan. She also understands that She can call clinic at any time with any questions, concerns, or complaints.    Cancer Staging  Colon cancer [facility]) Staging form: Colon and Rectum, AJCC 8th Edition - Pathologic stage from 06/10/2022: Stage IIIC (pT4b, pN1c, cM0) - Signed by Jeralyn Ruths, MD on 06/10/2022 Stage prefix: Initial diagnosis Total positive nodes: 0 Histologic grading system: 4 grade system Histologic grade (G): G3   Jeralyn Ruths, MD   02/10/2024 4:53 PM

## 2024-03-23 ENCOUNTER — Other Ambulatory Visit

## 2024-03-23 ENCOUNTER — Inpatient Hospital Stay

## 2024-03-30 ENCOUNTER — Inpatient Hospital Stay: Attending: Oncology

## 2024-04-04 ENCOUNTER — Inpatient Hospital Stay (HOSPITAL_BASED_OUTPATIENT_CLINIC_OR_DEPARTMENT_OTHER): Admitting: Hospice and Palliative Medicine

## 2024-04-04 ENCOUNTER — Inpatient Hospital Stay

## 2024-04-04 ENCOUNTER — Inpatient Hospital Stay: Attending: Oncology

## 2024-04-04 VITALS — BP 138/81 | HR 101 | Temp 98.2°F | Resp 18

## 2024-04-04 DIAGNOSIS — C184 Malignant neoplasm of transverse colon: Secondary | ICD-10-CM | POA: Diagnosis not present

## 2024-04-04 DIAGNOSIS — Z85118 Personal history of other malignant neoplasm of bronchus and lung: Secondary | ICD-10-CM | POA: Insufficient documentation

## 2024-04-04 DIAGNOSIS — G629 Polyneuropathy, unspecified: Secondary | ICD-10-CM | POA: Insufficient documentation

## 2024-04-04 DIAGNOSIS — R110 Nausea: Secondary | ICD-10-CM

## 2024-04-04 DIAGNOSIS — G893 Neoplasm related pain (acute) (chronic): Secondary | ICD-10-CM

## 2024-04-04 DIAGNOSIS — Z923 Personal history of irradiation: Secondary | ICD-10-CM | POA: Diagnosis not present

## 2024-04-04 DIAGNOSIS — F17290 Nicotine dependence, other tobacco product, uncomplicated: Secondary | ICD-10-CM | POA: Diagnosis not present

## 2024-04-04 DIAGNOSIS — F419 Anxiety disorder, unspecified: Secondary | ICD-10-CM | POA: Insufficient documentation

## 2024-04-04 DIAGNOSIS — D649 Anemia, unspecified: Secondary | ICD-10-CM | POA: Insufficient documentation

## 2024-04-04 DIAGNOSIS — Z800 Family history of malignant neoplasm of digestive organs: Secondary | ICD-10-CM | POA: Diagnosis not present

## 2024-04-04 DIAGNOSIS — R1010 Upper abdominal pain, unspecified: Secondary | ICD-10-CM | POA: Diagnosis not present

## 2024-04-04 DIAGNOSIS — Z515 Encounter for palliative care: Secondary | ICD-10-CM | POA: Diagnosis not present

## 2024-04-04 DIAGNOSIS — C189 Malignant neoplasm of colon, unspecified: Secondary | ICD-10-CM | POA: Diagnosis present

## 2024-04-04 DIAGNOSIS — R630 Anorexia: Secondary | ICD-10-CM | POA: Insufficient documentation

## 2024-04-04 DIAGNOSIS — R112 Nausea with vomiting, unspecified: Secondary | ICD-10-CM | POA: Insufficient documentation

## 2024-04-04 DIAGNOSIS — R197 Diarrhea, unspecified: Secondary | ICD-10-CM | POA: Insufficient documentation

## 2024-04-04 LAB — CMP (CANCER CENTER ONLY)
ALT: 10 U/L (ref 0–44)
AST: 12 U/L — ABNORMAL LOW (ref 15–41)
Albumin: 3.1 g/dL — ABNORMAL LOW (ref 3.5–5.0)
Alkaline Phosphatase: 39 U/L (ref 38–126)
Anion gap: 8 (ref 5–15)
BUN: 27 mg/dL — ABNORMAL HIGH (ref 8–23)
CO2: 23 mmol/L (ref 22–32)
Calcium: 8.8 mg/dL — ABNORMAL LOW (ref 8.9–10.3)
Chloride: 103 mmol/L (ref 98–111)
Creatinine: 0.74 mg/dL (ref 0.44–1.00)
GFR, Estimated: >60 mL/min (ref >60)
Glucose, Bld: 143 mg/dL — ABNORMAL HIGH (ref 70–99)
Potassium: 4.6 mmol/L (ref 3.5–5.1)
Sodium: 134 mmol/L — ABNORMAL LOW (ref 135–145)
Total Bilirubin: 0.3 mg/dL (ref 0.0–1.2)
Total Protein: 6.4 g/dL — ABNORMAL LOW (ref 6.5–8.1)

## 2024-04-04 LAB — CBC WITH DIFFERENTIAL (CANCER CENTER ONLY)
Abs Immature Granulocytes: 0.06 10*3/uL (ref 0.00–0.07)
Basophils Absolute: 0.1 10*3/uL (ref 0.0–0.1)
Basophils Relative: 1 %
Eosinophils Absolute: 0.1 10*3/uL (ref 0.0–0.5)
Eosinophils Relative: 1 %
HCT: 28.7 % — ABNORMAL LOW (ref 36.0–46.0)
Hemoglobin: 8.9 g/dL — ABNORMAL LOW (ref 12.0–15.0)
Immature Granulocytes: 1 %
Lymphocytes Relative: 8 %
Lymphs Abs: 0.8 10*3/uL (ref 0.7–4.0)
MCH: 27.5 pg (ref 26.0–34.0)
MCHC: 31 g/dL (ref 30.0–36.0)
MCV: 88.6 fL (ref 80.0–100.0)
Monocytes Absolute: 0.5 10*3/uL (ref 0.1–1.0)
Monocytes Relative: 5 %
Neutro Abs: 8.9 10*3/uL — ABNORMAL HIGH (ref 1.7–7.7)
Neutrophils Relative %: 84 %
Platelet Count: 275 10*3/uL (ref 150–400)
RBC: 3.24 MIL/uL — ABNORMAL LOW (ref 3.87–5.11)
RDW: 15.4 % (ref 11.5–15.5)
WBC Count: 10.5 10*3/uL (ref 4.0–10.5)
nRBC: 0 % (ref 0.0–0.2)

## 2024-04-04 LAB — MAGNESIUM: Magnesium: 1.3 mg/dL — ABNORMAL LOW (ref 1.7–2.4)

## 2024-04-04 MED ORDER — OXYCODONE HCL 5 MG PO TABS: 5.0000 mg | ORAL_TABLET | ORAL | 0 refills | Status: DC | PRN

## 2024-04-04 MED ORDER — HEPARIN SOD (PORK) LOCK FLUSH 100 UNIT/ML IV SOLN
500.0000 [IU] | Freq: Once | INTRAVENOUS | Status: AC | PRN
Start: 2024-04-04 — End: 2024-04-04
  Administered 2024-04-04 (×2): 500 [IU]
  Filled 2024-04-04: qty 5

## 2024-04-04 MED ORDER — SODIUM CHLORIDE 0.9% FLUSH
10.0000 mL | Freq: Once | INTRAVENOUS | Status: AC | PRN
  Administered 2024-04-04 (×2): 10 mL
  Filled 2024-04-04: qty 10

## 2024-04-04 MED ORDER — MAGNESIUM SULFATE 2 GM/50ML IV SOLN
2.0000 g | Freq: Once | INTRAVENOUS | Status: AC
  Administered 2024-04-04: 2 g via INTRAVENOUS
  Filled 2024-04-04: qty 50

## 2024-04-04 MED ORDER — SODIUM CHLORIDE 0.9 % IV SOLN
Freq: Once | INTRAVENOUS | Status: AC
  Filled 2024-04-04: qty 250

## 2024-04-04 MED ORDER — PROCHLORPERAZINE EDISYLATE 10 MG/2ML IJ SOLN
10.0000 mg | Freq: Once | INTRAMUSCULAR | Status: AC
  Administered 2024-04-04: 10 mg via INTRAVENOUS

## 2024-04-04 NOTE — Progress Notes (Signed)
 Symptom Management Clinic [facility] at [facility] [phone] [phone]) [phone]  Patient Care Team: Westley Hammers, MD as PCP - General (Internal Medicine) Constancia Delton, MD as PCP - Cardiology (Cardiology) Rochell Chroman, RN as Oncology Nurse Navigator Adrian Alba, Deadra Everts, MD as Consulting Physician (Oncology) Glenis Langdon, MD as Consulting Physician (Radiation Oncology)   NAME OF PATIENT: Jessica Stout  [id number]  February 08, 1951   DATE OF VISIT: 04/04/24  REASON FOR CONSULT: Jessica Stout is a 73 y.o. 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  Patient is not a surgical candidate for lung cancer.  She is status post XRT in May 2023.  She has had uptrending CEA but is currently not on active treatment.  INTERVAL HISTORY: Patient was here for port flush and labs.  Was complaining of poorly controlled pain.  Patient was an add-on to [facility] to address pain management.  Patient reports worsening abdominal pain over the past several weeks.  She did have about a week of nausea, vomiting, diarrhea.  Reports that that has improved recently.  However, upper abdominal pain has persisted.  Historically, patient has utilized acetaminophen  with good effect.  More recently, however, acetaminophen  has done little to improve her pain.  Denies any neurologic complaints. Denies recent fevers or illnesses. Denies any easy bleeding or bruising. Reports poor appetite and weight loss. Denies chest pain. Denies urinary complaints. Patient offers no further specific complaints today.   PAST MEDICAL HISTORY: Past Medical History:  Diagnosis Date   Anemia    Anxiety    Asthma    Cancer (HCC)    colon   CHF (congestive heart failure) (HCC)    COPD (chronic obstructive pulmonary disease) (HCC)    Diabetes mellitus without complication (HCC)    Hyperlipidemia    Hypertension    Stroke  [facility])     PAST SURGICAL HISTORY:  Past Surgical History:  Procedure Laterality Date   BIOPSY  06/26/2023   Procedure: BIOPSY;  Surgeon: Shane Darling, MD;  Location: [facility] ENDOSCOPY;  Service: Endoscopy;;   COLONOSCOPY N/A 06/26/2023   Procedure: COLONOSCOPY;  Surgeon: Shane Darling, MD;  Location: [facility] ENDOSCOPY;  Service: Endoscopy;  Laterality: N/A;   COLONOSCOPY WITH PROPOFOL  N/A 02/03/2022   Procedure: COLONOSCOPY WITH PROPOFOL ;  Surgeon: Selena Daily, MD;  Location: [facility] ENDOSCOPY;  Service: Gastroenterology;  Laterality: N/A;   ESOPHAGOGASTRODUODENOSCOPY N/A 06/26/2023   Procedure: ESOPHAGOGASTRODUODENOSCOPY (EGD);  Surgeon: Shane Darling, MD;  Location: [facility] ENDOSCOPY;  Service: Endoscopy;  Laterality: N/A;   ESOPHAGOGASTRODUODENOSCOPY (EGD) WITH PROPOFOL  N/A 02/03/2022   Procedure: ESOPHAGOGASTRODUODENOSCOPY (EGD) WITH PROPOFOL ;  Surgeon: Selena Daily, MD;  Location: [facility] ENDOSCOPY;  Service: Gastroenterology;  Laterality: N/A;   GIVENS CAPSULE STUDY N/A 02/03/2022   Procedure: GIVENS CAPSULE STUDY;  Surgeon: Selena Daily, MD;  Location: [facility] ENDOSCOPY;  Service: Gastroenterology;  Laterality: N/A;  possible Capsule Study   IR IMAGING GUIDED PORT INSERTION  06/18/2022   POLYPECTOMY  06/26/2023   Procedure: POLYPECTOMY;  Surgeon: Shane Darling, MD;  Location: [facility] ENDOSCOPY;  Service: Endoscopy;;    HEMATOLOGY/ONCOLOGY HISTORY:  Oncology History  Colon cancer (HCC)  02/04/2022 Initial Diagnosis   Colon cancer (HCC)   06/10/2022 Cancer Staging   Staging form: Colon and Rectum, AJCC 8th Edition - Pathologic stage from 06/10/2022: Stage IIIC (pT4b, pN1c, cM0) - Signed by Shellie Dials, MD on 06/10/2022 Stage prefix: Initial diagnosis  Total positive nodes: 0 Histologic grading system: 4 grade system Histologic grade (G): G3   06/24/2022 - 07/08/2022 Chemotherapy   Patient is on Treatment Plan : COLORECTAL FOLFOX q14d x 6 months     06/24/2022  - 12/25/2022 Chemotherapy   Patient is on Treatment Plan : COLORECTAL FOLFOX q14d x 6 months     06/09/2023 - 07/23/2023 Chemotherapy   Patient is on Treatment Plan : COLORECTAL FOLFIRI q14d     08/04/2023 -  Chemotherapy   Patient is on Treatment Plan : COLORECTAL FOLFIRI + Bevacizumab  q14d       ALLERGIES:  is allergic to codeine.  MEDICATIONS:  Current Outpatient Medications  Medication Sig Dispense Refill   acetaminophen  (TYLENOL ) 325 MG tablet Take 650 mg by mouth every 6 (six) hours as needed.     albuterol  (VENTOLIN  HFA) 108 (90 Base) MCG/ACT inhaler Inhale into the lungs.     atorvastatin  (LIPITOR) 40 MG tablet Take 40 mg by mouth daily.     clonazePAM (KLONOPIN) 1 MG tablet Take 1 mg by mouth 2 (two) times daily.     fenofibrate  54 MG tablet Take 54 mg by mouth daily.     gabapentin  (NEURONTIN ) 600 MG tablet Take by mouth 2 (two) times daily.     insulin  lispro (HUMALOG) 100 UNIT/ML injection Inject 1-10 Units into the skin 3 (three) times daily before meals. Per sliding scale (Patient not taking: Reported on 08/25/2023)     LANTUS  SOLOSTAR 100 UNIT/ML Solostar Pen Inject 12 Units into the skin at bedtime.     lisinopril (ZESTRIL) 20 MG tablet Take 20 mg by mouth daily.     Magnesium  Citrate 200 MG TABS Take 1 tablet by mouth in the morning and at bedtime. 250 twice daily (Patient not taking: Reported on 02/10/2024)     megestrol  (MEGACE ) 40 MG tablet Take 1 tablet (40 mg total) by mouth daily. 30 tablet 0   metoprolol  succinate (TOPROL -XL) 50 MG 24 hr tablet Take 50 mg by mouth daily. Take with or immediately following a meal.     OLANZapine  (ZYPREXA ) 10 MG tablet Take 0.5-1 tablets (5-10 mg total) by mouth at bedtime as needed (nausea). 30 tablet 1   ondansetron  (ZOFRAN ) 8 MG tablet Take 1 tablet (8 mg total) by mouth every 8 (eight) hours as needed for nausea or vomiting. 60 tablet 2   pantoprazole  (PROTONIX ) 40 MG tablet Take 1 tablet (40 mg total) by mouth daily. 30 tablet 5    ranolazine  (RANEXA ) 500 MG 12 hr tablet Take 500 mg by mouth 2 (two) times daily.     sitaGLIPtin-metformin (JANUMET) 50-1000 MG tablet Take 1 tablet by mouth daily.     venlafaxine  XR (EFFEXOR -XR) 75 MG 24 hr capsule Take 75 mg by mouth daily.     No current facility-administered medications for this visit.    VITAL SIGNS: There were no vitals taken for this visit. There were no vitals filed for this visit.  Estimated body mass index is 21.11 kg/m as calculated from the following:   Height as of 02/10/24: 5\' 4"  (1.626 m).   Weight as of 02/10/24: 123 lb (55.8 kg).  LABS: CBC:    Component Value Date/Time   WBC 6.6 01/27/2024 0945   HGB 10.1 (L) 01/27/2024 0945   HGB 8.7 (L) 12/09/2023 0819   HCT 31.9 (L) 01/27/2024 0945   PLT 201 01/27/2024 0945   PLT 192 12/09/2023 0819   MCV 95.5 01/27/2024 0945   NEUTROABS  4.7 01/27/2024 0945   LYMPHSABS 1.0 01/27/2024 0945   MONOABS 0.6 01/27/2024 0945   EOSABS 0.2 01/27/2024 0945   BASOSABS 0.0 01/27/2024 0945   Comprehensive Metabolic Panel:    Component Value Date/Time   NA 139 01/27/2024 0945   K 3.7 01/27/2024 0945   CL 105 01/27/2024 0945   CO2 25 01/27/2024 0945   BUN 31 (H) 01/27/2024 0945   CREATININE 0.83 01/27/2024 0945   GLUCOSE 187 (H) 01/27/2024 0945   CALCIUM  8.9 01/27/2024 0945   AST 14 (L) 01/27/2024 0945   ALT 10 01/27/2024 0945   ALKPHOS 47 01/27/2024 0945   BILITOT 0.3 01/27/2024 0945   PROT 6.7 01/27/2024 0945   ALBUMIN 3.5 01/27/2024 0945    RADIOGRAPHIC STUDIES: No results found.  PERFORMANCE STATUS (ECOG) : 2 - Symptomatic, <50% confined to bed  Review of Systems Unless otherwise noted, a complete review of systems is negative.  Physical Exam General: NAD Cardiovascular: regular rate and rhythm Pulmonary: clear ant fields Abdomen: soft, nontender, + bowel sounds GU: no suprapubic tenderness Extremities: no edema, no joint deformities Skin: no rashes Neurological: Weakness but otherwise  nonfocal  IMPRESSION/PLAN: Colon cancer -recent rising CEA and worsening abdominal pain.  Discussed with Dr. Adrian Alba and will move up PET scan/oncology visit.  Nausea/vomiting/diarrhea -will check stool studies.  Treat symptomatically.  IV fluids today.  Hypomagnesemia -replete with IV mag  Neoplasm related pain -discussed limiting dosing of acetaminophen .  Will start oxycodone  5 mg every 4 hours as needed  Case and plan discussed with Dr. Adrian Alba   Patient expressed understanding and was in agreement with this plan. She also understands that She can call clinic at any time with any questions, concerns, or complaints.   Thank you for allowing me to participate in the care of this very pleasant patient.   Time Total: 25 minutes  Visit consisted of counseling and education dealing with the complex and emotionally intense issues of symptom management in the setting of serious illness.Greater than 50%  of this time was spent counseling and coordinating care related to the above assessment and plan.  Signed by: Gerilyn Kobus, PhD, NP-C

## 2024-04-04 NOTE — Progress Notes (Signed)
 While pt was here for port flush/lab, she expressed an increase in her abdominal pain. States that she has been using Tylenol , but is requesting something stronger. Pt further questioned about symptoms; reports pain level reaching 8-9/10 at times. Also notes increase in nausea, vomiting, and diarrhea. Message sent to MD team and palliative care for recommendation. Pt to be added on to symptom management today. Additional labs; cbc/cmp/mag collected. Josh Borders to see pt chair side in the infusion area. Vitals stable.

## 2024-04-05 LAB — CEA: CEA: 129 ng/mL — ABNORMAL HIGH (ref 0.0–4.7)

## 2024-04-07 ENCOUNTER — Other Ambulatory Visit: Payer: Self-pay

## 2024-04-07 DIAGNOSIS — C184 Malignant neoplasm of transverse colon: Secondary | ICD-10-CM

## 2024-04-07 DIAGNOSIS — C189 Malignant neoplasm of colon, unspecified: Secondary | ICD-10-CM | POA: Diagnosis not present

## 2024-04-07 LAB — GASTROINTESTINAL PANEL BY PCR, STOOL (REPLACES STOOL CULTURE)

## 2024-04-07 LAB — C DIFFICILE QUICK SCREEN W PCR REFLEX
C Diff antigen: NEGATIVE
C Diff interpretation: NOT DETECTED
C Diff toxin: NEGATIVE

## 2024-04-15 ENCOUNTER — Inpatient Hospital Stay: Admitting: Hospice and Palliative Medicine

## 2024-04-15 DIAGNOSIS — G893 Neoplasm related pain (acute) (chronic): Secondary | ICD-10-CM | POA: Diagnosis not present

## 2024-04-15 DIAGNOSIS — C189 Malignant neoplasm of colon, unspecified: Secondary | ICD-10-CM | POA: Diagnosis not present

## 2024-04-15 DIAGNOSIS — C184 Malignant neoplasm of transverse colon: Secondary | ICD-10-CM

## 2024-04-15 NOTE — Progress Notes (Signed)
 Virtual Visit via Telephone Note  I connected with Jessica Stout on 04/15/24 at  2:00 PM EDT by telephone and verified that I am speaking with the correct person using two identifiers.  Location: Patient: Home Provider: Clinic   I discussed the limitations, risks, security and privacy concerns of performing an evaluation and management service by telephone and the availability of in person appointments. I also discussed with the patient that there may be a patient responsible charge related to this service. The patient expressed understanding and agreed to proceed.   History of Present Illness: Jessica Stout is a 73 y.o. 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  Patient is not a surgical candidate for lung cancer.  She is status post XRT in May 2023.  She has had uptrending CEA but is currently not on active treatment.    Observations/Objective: Patient was seen in [facility] on 04/04/2024 with abdominal pain.  Was started on oxycodone .  Plan with obtaining PET scan.  Follow-up call today.  Patient states that she is feeling much improved.  Back to her baseline.  States that she only took oxycodone  once or twice and that it was effective.  However, has not required any recently.  Denies any symptomatic complaints or concerns today.  States her appetite has improved.  Performance status is stable.  Assessment and Plan: Stage IIIc colon cancer -pending PET scan and follow-up with Dr. Adrian Alba  Neoplasm related pain -improved.  Continue oxycodone  as needed.  Continue daily bowel regimen.  Follow Up Instructions: Follow-up telephone visit 1 month   I discussed the assessment and treatment plan with the patient. The patient was provided an opportunity to ask questions and all were answered. The patient agreed with the plan and demonstrated an understanding of the instructions.   The patient was advised to call back or seek an  in-person evaluation if the symptoms worsen or if the condition fails to improve as anticipated.  I provided 10 minutes of non-face-to-face time during this encounter.   Peggyann Bower, NP

## 2024-04-22 ENCOUNTER — Inpatient Hospital Stay

## 2024-04-22 ENCOUNTER — Ambulatory Visit
Admission: RE | Admit: 2024-04-22 | Discharge: 2024-04-22 | Disposition: A | Discharge status: Home / Self Care | Source: Ambulatory Visit | Attending: Oncology | Admitting: Oncology

## 2024-04-22 DIAGNOSIS — I700 Atherosclerosis of aorta: Secondary | ICD-10-CM | POA: Insufficient documentation

## 2024-04-22 DIAGNOSIS — D1779 Benign lipomatous neoplasm of other sites: Secondary | ICD-10-CM | POA: Insufficient documentation

## 2024-04-22 DIAGNOSIS — R933 Abnormal findings on diagnostic imaging of other parts of digestive tract: Secondary | ICD-10-CM | POA: Insufficient documentation

## 2024-04-22 DIAGNOSIS — C184 Malignant neoplasm of transverse colon: Secondary | ICD-10-CM | POA: Diagnosis present

## 2024-04-22 DIAGNOSIS — C3490 Malignant neoplasm of unspecified part of unspecified bronchus or lung: Secondary | ICD-10-CM | POA: Diagnosis not present

## 2024-04-22 DIAGNOSIS — M898X8 Other specified disorders of bone, other site: Secondary | ICD-10-CM | POA: Insufficient documentation

## 2024-04-22 DIAGNOSIS — K8020 Calculus of gallbladder without cholecystitis without obstruction: Secondary | ICD-10-CM | POA: Insufficient documentation

## 2024-04-22 LAB — GLUCOSE, CAPILLARY: Glucose-Capillary: 90 mg/dL (ref 70–99)

## 2024-04-22 MED ORDER — FLUDEOXYGLUCOSE F - 18 (FDG) INJECTION
6.1000 | Freq: Once | INTRAVENOUS | Status: AC | PRN
  Administered 2024-04-22: 6.4 via INTRAVENOUS

## 2024-04-28 ENCOUNTER — Encounter: Payer: Self-pay | Admitting: Cardiology

## 2024-04-29 ENCOUNTER — Encounter: Payer: Self-pay | Admitting: Oncology

## 2024-04-29 ENCOUNTER — Inpatient Hospital Stay (HOSPITAL_BASED_OUTPATIENT_CLINIC_OR_DEPARTMENT_OTHER): Admitting: Oncology

## 2024-04-29 VITALS — BP 101/56 | HR 100 | Temp 96.2°F | Resp 16 | Wt 114.0 lb

## 2024-04-29 DIAGNOSIS — C189 Malignant neoplasm of colon, unspecified: Secondary | ICD-10-CM | POA: Diagnosis not present

## 2024-04-29 DIAGNOSIS — R634 Abnormal weight loss: Secondary | ICD-10-CM

## 2024-04-29 DIAGNOSIS — C184 Malignant neoplasm of transverse colon: Secondary | ICD-10-CM

## 2024-04-29 NOTE — Progress Notes (Signed)
 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  INTERVAL HISTORY: Patient returns to clinic today for further evaluation and discussion of her PET scan results.  She continues to have intermittent abdominal pain and nausea which seems to be worse on weekends only.  She continues to have chronic weakness and fatigue.  She has a chronic peripheral neuropathy, but no other neurologic complaints.  She denies any recent fevers or illnesses.  She has no chest pain, shortness of breath, cough, or hemoptysis.  She denies any vomiting, constipation, or diarrhea.  She has no melena or hematochezia.  She has no urinary complaints.  Patient offers no further specific complaints today.  REVIEW OF SYSTEMS:   Review of Systems  Constitutional:  Positive for malaise/fatigue. Negative for fever and weight loss.  Respiratory: Negative.  Negative for cough, hemoptysis and shortness of breath.   Cardiovascular: Negative.  Negative for chest pain and leg swelling.  Gastrointestinal:  Positive for abdominal pain and nausea. Negative for blood in stool, constipation, diarrhea, melena and vomiting.  Genitourinary: Negative.  Negative for dysuria.  Musculoskeletal: Negative.  Negative for back pain and falls.  Skin: Negative.  Negative for rash.  Neurological:  Positive for sensory change and weakness. Negative for dizziness, focal weakness and headaches.  Psychiatric/Behavioral: Negative.  The patient is not nervous/anxious.     As  per HPI. Otherwise, a complete review of systems is negative.  PAST MEDICAL HISTORY: Past Medical History:  Diagnosis Date   Anemia    Anxiety    Asthma    Cancer (HCC)    colon   CHF (congestive heart failure) (HCC)    COPD (chronic obstructive pulmonary disease) (HCC)    Diabetes mellitus without complication (HCC)    Hyperlipidemia    Hypertension    Stroke [facility])     PAST SURGICAL HISTORY: Past Surgical History:  Procedure Laterality Date   BIOPSY  06/26/2023   Procedure: BIOPSY;  Surgeon: Shane Darling, MD;  Location: [facility] ENDOSCOPY;  Service: Endoscopy;;   COLONOSCOPY N/A 06/26/2023   Procedure: COLONOSCOPY;  Surgeon: Shane Darling, MD;  Location: [facility] ENDOSCOPY;  Service: Endoscopy;  Laterality: N/A;   COLONOSCOPY WITH PROPOFOL  N/A 02/03/2022   Procedure: COLONOSCOPY WITH PROPOFOL ;  Surgeon: Selena Daily, MD;  Location: [facility] ENDOSCOPY;  Service: Gastroenterology;  Laterality: N/A;   ESOPHAGOGASTRODUODENOSCOPY N/A 06/26/2023   Procedure: ESOPHAGOGASTRODUODENOSCOPY (EGD);  Surgeon: Shane Darling, MD;  Location: [facility] ENDOSCOPY;  Service: Endoscopy;  Laterality: N/A;   ESOPHAGOGASTRODUODENOSCOPY (EGD) WITH PROPOFOL  N/A 02/03/2022   Procedure: ESOPHAGOGASTRODUODENOSCOPY (EGD) WITH PROPOFOL ;  Surgeon: Selena Daily, MD;  Location: [facility] ENDOSCOPY;  Service: Gastroenterology;  Laterality: N/A;   GIVENS CAPSULE STUDY N/A 02/03/2022   Procedure: GIVENS CAPSULE STUDY;  Surgeon: Selena Daily, MD;  Location: [facility] ENDOSCOPY;  Service: Gastroenterology;  Laterality: N/A;  possible Capsule Study   IR IMAGING GUIDED PORT INSERTION  06/18/2022   POLYPECTOMY  06/26/2023   Procedure: POLYPECTOMY;  Surgeon: Shane Darling, MD;  Location: [facility] ENDOSCOPY;  Service: Endoscopy;;    FAMILY HISTORY: Family  History  Problem Relation Age of Onset   Diabetes Mother    Heart attack Father    Colon cancer Father     ADVANCED DIRECTIVES (Y/N):  N  HEALTH  MAINTENANCE: Social History   Tobacco Use   Smoking status: Former    Types: E-cigarettes    Quit date: 12/08/2022    Years since quitting: 1.3   Tobacco comments:    Quit Cigarettes 12/2021  Vaping Use   Vaping status: Every Day   Substances: Nicotine, THC  Substance Use Topics   Alcohol use: Yes    Comment: rare   Drug use: Yes    Types: Marijuana     Colonoscopy:  PAP:  Bone density:  Lipid panel:  Allergies  Allergen Reactions   Codeine Rash    Happened in 1970s    Current Outpatient Medications  Medication Sig Dispense Refill   acetaminophen  (TYLENOL ) 325 MG tablet Take 650 mg by mouth every 6 (six) hours as needed.     albuterol  (VENTOLIN  HFA) 108 (90 Base) MCG/ACT inhaler Inhale into the lungs.     atorvastatin  (LIPITOR) 40 MG tablet Take 40 mg by mouth daily.     clonazePAM (KLONOPIN) 1 MG tablet Take 1 mg by mouth 2 (two) times daily.     fenofibrate  54 MG tablet Take 54 mg by mouth daily.     gabapentin  (NEURONTIN ) 600 MG tablet Take by mouth 2 (two) times daily.     lisinopril (ZESTRIL) 20 MG tablet Take 20 mg by mouth daily.     metoprolol  succinate (TOPROL -XL) 50 MG 24 hr tablet Take 50 mg by mouth daily. Take with or immediately following a meal.     OLANZapine  (ZYPREXA ) 10 MG tablet Take 0.5-1 tablets (5-10 mg total) by mouth at bedtime as needed (nausea). 30 tablet 1   ondansetron  (ZOFRAN ) 8 MG tablet Take 1 tablet (8 mg total) by mouth every 8 (eight) hours as needed for nausea or vomiting. 60 tablet 2   oxyCODONE  (OXY IR/ROXICODONE ) 5 MG immediate release tablet Take 1 tablet (5 mg total) by mouth every 4 (four) hours as needed for severe pain (pain score 7-10). 30 tablet 0   pantoprazole  (PROTONIX ) 40 MG tablet Take 1 tablet (40 mg total) by mouth daily. 30 tablet 5   ranolazine  (RANEXA ) 500 MG 12 hr tablet Take 500 mg by mouth 2 (two) times daily.     sitaGLIPtin-metformin (JANUMET) 50-1000 MG tablet Take 1 tablet by mouth daily.     venlafaxine  XR  (EFFEXOR -XR) 75 MG 24 hr capsule Take 75 mg by mouth daily.     insulin  lispro (HUMALOG) 100 UNIT/ML injection Inject 1-10 Units into the skin 3 (three) times daily before meals. Per sliding scale (Patient not taking: Reported on 08/25/2023)     LANTUS  SOLOSTAR 100 UNIT/ML Solostar Pen Inject 12 Units into the skin at bedtime. (Patient not taking: Reported on 04/29/2024)     Magnesium  Citrate 200 MG TABS Take 1 tablet by mouth in the morning and at bedtime. 250 twice daily (Patient not taking: Reported on 11/18/2023)     megestrol  (MEGACE ) 40 MG tablet Take 1 tablet (40 mg total) by mouth daily. (Patient not taking: Reported on 04/29/2024) 30 tablet 0   No current facility-administered medications for this visit.    OBJECTIVE: Vitals:   04/29/24 1128  BP: (!) 101/56  Pulse: 100  Resp: 16  Temp: (!) 96.2 F (35.7 C)  SpO2: 97%  Body mass index is 19.57 kg/m.    ECOG FS:1 - Symptomatic but completely ambulatory  General: Well-developed, well-nourished, no acute distress.  Sitting in a wheelchair. Eyes: Pink conjunctiva, anicteric sclera. HEENT: Normocephalic, moist mucous membranes. Lungs: No audible wheezing or coughing. Heart: Regular rate and rhythm. Abdomen: Soft, nontender, no obvious distention. Musculoskeletal: No edema, cyanosis, or clubbing. Neuro: Alert, answering all questions appropriately. Cranial nerves grossly intact. Skin: No rashes or petechiae noted. Psych: Normal affect.  LAB RESULTS:  Lab Results  Component Value Date   NA 134 (L) 04/04/2024   K 4.6 04/04/2024   CL 103 04/04/2024   CO2 23 04/04/2024   GLUCOSE 143 (H) 04/04/2024   BUN 27 (H) 04/04/2024   CREATININE 0.74 04/04/2024   CALCIUM  8.8 (L) 04/04/2024   PROT 6.4 (L) 04/04/2024   ALBUMIN 3.1 (L) 04/04/2024   AST 12 (L) 04/04/2024   ALT 10 04/04/2024   ALKPHOS 39 04/04/2024   BILITOT 0.3 04/04/2024   GFRNONAA >60 04/04/2024    Lab Results  Component Value Date   WBC 10.5 04/04/2024    NEUTROABS 8.9 (H) 04/04/2024   HGB 8.9 (L) 04/04/2024   HCT 28.7 (L) 04/04/2024   MCV 88.6 04/04/2024   PLT 275 04/04/2024     STUDIES: NM PET Image Restag (PS) Skull Base To Thigh Result Date: 04/28/2024 CLINICAL DATA:  Subsequent treatment strategy for colon cancer. EXAM: NUCLEAR MEDICINE PET SKULL BASE TO THIGH TECHNIQUE: 6.4 mCi F-18 FDG was injected intravenously. Full-ring PET imaging was performed from the skull base to thigh after the radiotracer. CT data was obtained and used for attenuation correction and anatomic localization. Fasting blood glucose: 90 mg/dl COMPARISON:  16/09/9603 FINDINGS: Mediastinal blood pool activity: SUV max 2.1 Liver activity: SUV max NA NECK: No hypermetabolic lymph nodes in the neck. Incidental CT findings: None. CHEST: No hypermetabolic mediastinal or hilar nodes. No suspicious pulmonary nodules on the CT scan. Low level uptake identified in multiple lateral right ribs, corresponding to healing fractures. Abnormal mineralization is identified in the posterior left fifth rib (image 49/6) with associated fracture and low level FDG uptake. This is stable since previous PET-CT of 02/02/2024 and with quite subtle on CT of 08/24/2023 although the fracture was not present on the 2024 exam. This would be an atypical appearance of metastatic colon cancer. Patient also carries a diagnosis of squamous cell lung cancer and if this was in the posterior upper left lung, radiation changes in the posterior left fifth rib with pathologic fracture would be a consideration. Metastatic lung cancer not entirely excluded. Incidental CT findings: Right Port-A-Cath tip is positioned in the upper right atrium. Coronary artery calcification is evident. Mild atherosclerotic calcification noted in the thoracic aorta. ABDOMEN/PELVIS: No abnormal hypermetabolic activity within the liver, pancreas, adrenal glands, or spleen. No hypermetabolic lymph nodes in the abdomen or pelvis. As on the prior  study, there is fairly extensive FDG accumulation in small bowel and residual colon surgical changes are noted in the stomach with relatively focal wall thickening in the antral region. Focal hypermetabolism is seen in the antral region of the stomach with SUV max = 10.9. Uptake is identified in the regions of multiple bowel anastomoses and along the ventral abdominal wall mesh. Incidental CT findings: Calcified gallstone evident. There is moderate atherosclerotic calcification of the abdominal aorta without aneurysm. 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 SKELETON: See above. Incidental CT findings: Bones are diffusely demineralized. IMPRESSION: 1. No definitive evidence  for metastatic disease in the chest, abdomen, or pelvis. 2. Abnormal mineralization in the posterior left fifth rib with associated fracture and low level FDG uptake. This is stable since previous PET-CT of 02/02/2024 and lesion was quite subtle on CT of 08/24/2023 although the fracture was not present on the 2024 exam. This would be an atypical appearance of metastatic colon cancer. Patient also carries a diagnosis of squamous cell lung cancer and if this was in the posterior upper left lung, radiation changes in the posterior left fifth rib with pathologic fracture would be a consideration. Metastatic lung cancer not entirely excluded. 3. Fairly extensive FDG accumulation in small bowel and residual colon, similar to prior study. Focal hypermetabolism is seen in the antral region of the stomach with SUV max = 10.9. Stomach does appear to have some focal wall thickening in the antral region on noncontrast CT imaging today. Upper endoscopy may be warranted to further evaluate. 4. 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 This was characterized previously as myelolipoma. 5. Cholelithiasis. 6.  Aortic Atherosclerosis (ICD10-I70.0). Electronically  Signed   By: Donnal Fusi M.D.   On: 04/28/2024 11:35   ONCOLOGY HISTORY: Patient underwent complete surgical resection at [facility] on May 23, 2022.  Large colon mass was noted to partially invade stomach as well as surrounding mesentery.  PET scan did not reveal any metastatic disease other than a positive lung nodule which was confirmed to be a second primary.  Patient completed her final cycle of adjuvant FOLFOX on December 23, 2022.  PET scan results from May 20, 2023 with clear progression of disease.  Patient was seen at [facility] and determined not to be a surgical candidate.  Patient then received 12 cycles of FOLFIRI between June 09, 2023 and March 08, 2024.   ASSESSMENT: Stage IIIc colon cancer.  PLAN:    Stage IIIc colon cancer: Patient last received chemotherapy with FOLFIRI on December 09, 2023.  Her CEA recently trended up, but now has remained stable at approximately 129.  PET scan results from Apr 22, 2024 reviewed independently and report as above also with no obvious evidence of progressive disease.  No treatment is needed at this time, but patient expressed understanding that she will likely have to reinitiate treatment in the future.  Return to clinic in 4 weeks with repeat laboratory work and further evaluation.  Will repeat imaging with PET scan in August 2025. 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  Patient completed XRT in May 2023.  PET scan above with no obvious evidence of recurrence.   Anemia: Hemoglobin trended down to 8.9, monitor.   Hypomagnesia: Continue oral magnesium  supplementation.  Will schedule patient for IV magnesium  at next clinic visit. Peripheral neuropathy: Chronic and unchanged.  Patient has had no recent falls.  She was previously given a referral to Occupational Therapy. Coping/anxiety: Patient was previously given a referral to [facility]. Poor appetite: Chronic and  unchanged. Nausea: Patient was given a prescription for Reglan today as well as instructed to take Zofran  regularly on a daily basis.  If no improvement, will consider referral back to GI.  Follow-up in 4 weeks as above. Abdominal pain: Continue oxycodone  as needed.  Patient expressed understanding and was in agreement with this plan. She also understands that She can call clinic at any time with any questions, concerns, or complaints.    Cancer  Staging  Colon cancer [facility]) Staging form: Colon and Rectum, AJCC 8th Edition - Pathologic stage from 06/10/2022: Stage IIIC (pT4b, pN1c, cM0) - Signed by Shellie Dials, MD on 06/10/2022 Stage prefix: Initial diagnosis Total positive nodes: 0 Histologic grading system: 4 grade system Histologic grade (G): G3   Shellie Dials, MD   04/29/2024 11:30 AM

## 2024-04-29 NOTE — Progress Notes (Signed)
 Pt in for follow up, reports every weekend she becomes nauseous,vomiting and has severe stomach pains that keep her in the bed. Reports using oxycodone  some but afraid of constipation.  Instructed on using miralax . Pt also reports she is not using megace  because of nausea.

## 2024-05-04 ENCOUNTER — Telehealth: Payer: Self-pay | Admitting: *Deleted

## 2024-05-04 ENCOUNTER — Other Ambulatory Visit: Payer: Self-pay | Admitting: *Deleted

## 2024-05-04 MED ORDER — METOCLOPRAMIDE HCL 10 MG PO TABS: 10.0000 mg | ORAL_TABLET | Freq: Three times a day (TID) | ORAL | 1 refills | Status: DC

## 2024-05-04 NOTE — Telephone Encounter (Signed)
 Husband is called today saying that he still does not have the medicine that Dr. Adrian Alba recommended at the appointment.  I spoke to the nurse Nils Baseman and it is Reglan and she is so sorry about not getting it in.  Husband says it is no problem you guys are always very nice and we appreciate everyone in the cancer center

## 2024-05-06 ENCOUNTER — Inpatient Hospital Stay: Attending: Oncology

## 2024-05-06 NOTE — Progress Notes (Signed)
 Nutrition Assessment   Reason for Assessment:  Weight loss    ASSESSMENT:   73 year old female with stage III colon cancer and stage I SCC of lung. Past medical history of anemia, CHF, COPD, DM, HLD, HTN, stroke. At [facility] on 05/23/22 noted open right colectomy, hernia repair, enterlysis, enterectomy resection of small bowel.  Currently not receiving treatment  Spoke with patient by phone for nutrition assessment.  Reports that she does not have an appetite.  Has only drank an ensure original today so far.  Does not get up until noon.  Has been taking the reglan for the past 2 days and taking nausea medication every am.  This seems to be helping feeling of nausea.  Say that she can take a few bites then doesn't want any more medication.  Says that potatoes (white and sweet) work for her along with chicken, macaroni and cheese, cottage cheese, bread, milk.     Medications: reglan, zofran , compazine , olanzapine , janumet   Labs: glucose 134, mag 1.3   Anthropometrics:   Height: 64 inches Weight: 114 lb 130 on 12/09/23 BMI: 19  12% weight loss in the last 5 months, significant   Estimated Energy Needs  Kcals: 1300-1560 Protein: 65-78 g Fluid: 1300-1560 ml   NUTRITION DIAGNOSIS: Inadequate oral intake related to altered GI function as evidenced by 12% weight loss, decreased intake, nausea.      INTERVENTION:  Encourage "nibbling" q 2 hours Encouraged 350 calorie ensure plus shake for more calories and protein than original.  Drink 1-3 times a day.   Take antinausea medication and reglan to help ease symptoms Discussed easy to digest foods. Will mail list of foods to choose with nausea. Mailed coupons for ensure Mailed contact number   MONITORING, EVALUATION, GOAL: Weight trends, intake   Next Visit: Friday, July 11 phone call  Domenique Quest B. Zollie Hipp, CSO, LDN Registered Dietitian [phone]

## 2024-05-10 ENCOUNTER — Other Ambulatory Visit: Payer: Self-pay | Admitting: *Deleted

## 2024-05-10 MED ORDER — OXYCODONE HCL 5 MG PO TABS
5.0000 mg | ORAL_TABLET | ORAL | 0 refills | Status: DC | PRN
Start: 2024-05-10 — End: 2024-05-31

## 2024-05-11 ENCOUNTER — Other Ambulatory Visit

## 2024-05-16 ENCOUNTER — Inpatient Hospital Stay: Admitting: Hospice and Palliative Medicine

## 2024-05-16 DIAGNOSIS — G893 Neoplasm related pain (acute) (chronic): Secondary | ICD-10-CM

## 2024-05-16 DIAGNOSIS — Z515 Encounter for palliative care: Secondary | ICD-10-CM

## 2024-05-16 DIAGNOSIS — Z85118 Personal history of other malignant neoplasm of bronchus and lung: Secondary | ICD-10-CM

## 2024-05-16 DIAGNOSIS — Z923 Personal history of irradiation: Secondary | ICD-10-CM | POA: Diagnosis not present

## 2024-05-16 DIAGNOSIS — C184 Malignant neoplasm of transverse colon: Secondary | ICD-10-CM

## 2024-05-16 DIAGNOSIS — C189 Malignant neoplasm of colon, unspecified: Secondary | ICD-10-CM | POA: Diagnosis not present

## 2024-05-16 NOTE — Progress Notes (Signed)
 Virtual Visit via Telephone Note  I connected with Jessica Stout on 05/16/24 at  3:00 PM EDT by telephone and verified that I am speaking with the correct person using two identifiers.  Location: Patient: Home Provider: Clinic   I discussed the limitations, risks, security and privacy concerns of performing an evaluation and management service by telephone and the availability of in person appointments. I also discussed with the patient that there may be a patient responsible charge related to this service. The patient expressed understanding and agreed to proceed.   History of Present Illness: Jessica Stout is a 73 y.o. 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  Patient is not a surgical candidate for lung cancer.  She is status post XRT in May 2023.  She has had uptrending CEA but is currently not on active treatment.    Observations/Objective: Patient says she is doing reasonably well.  Denies significant changes or concerns.  Patient continues to endorse intermittent abdominal pain.  States that she is taking acetaminophen  which helps.  She is utilizing oxycodone  once or twice daily as needed.  Denies any adverse effects from pain medications.  She did not require any refills today.  Patient denied other symptomatic complaints or concerns.  Assessment and Plan: Stage IIIc colon cancer -recent PET scan was without evidence of progressive disease.  Plan was for disease surveillance.  Neoplasm related pain -stable.  Continue oxycodone  as needed.  Continue daily bowel regimen.  Follow Up Instructions: Follow-up telephone visit 3 to 4 months   I discussed the assessment and treatment plan with the patient. The patient was provided an opportunity to ask questions and all were answered. The patient agreed with the plan and demonstrated an understanding of the instructions.   The patient was advised to call back or  seek an in-person evaluation if the symptoms worsen or if the condition fails to improve as anticipated.  I provided 10 minutes of non-face-to-face time during this encounter.   Peggyann Bower, NP

## 2024-05-18 ENCOUNTER — Ambulatory Visit: Admitting: Oncology

## 2024-05-27 ENCOUNTER — Encounter

## 2024-05-30 ENCOUNTER — Inpatient Hospital Stay

## 2024-05-30 ENCOUNTER — Inpatient Hospital Stay: Admitting: Oncology

## 2024-05-31 ENCOUNTER — Other Ambulatory Visit: Payer: Self-pay

## 2024-05-31 DIAGNOSIS — G893 Neoplasm related pain (acute) (chronic): Secondary | ICD-10-CM

## 2024-05-31 MED ORDER — OXYCODONE HCL 5 MG PO TABS: 5.0000 mg | ORAL_TABLET | ORAL | 0 refills | Status: DC | PRN

## 2024-06-10 ENCOUNTER — Inpatient Hospital Stay: Attending: Oncology

## 2024-06-10 DIAGNOSIS — R109 Unspecified abdominal pain: Secondary | ICD-10-CM | POA: Insufficient documentation

## 2024-06-10 DIAGNOSIS — R110 Nausea: Secondary | ICD-10-CM | POA: Insufficient documentation

## 2024-06-10 DIAGNOSIS — Z85118 Personal history of other malignant neoplasm of bronchus and lung: Secondary | ICD-10-CM | POA: Insufficient documentation

## 2024-06-10 DIAGNOSIS — G629 Polyneuropathy, unspecified: Secondary | ICD-10-CM | POA: Insufficient documentation

## 2024-06-10 DIAGNOSIS — C189 Malignant neoplasm of colon, unspecified: Secondary | ICD-10-CM | POA: Insufficient documentation

## 2024-06-10 DIAGNOSIS — F17290 Nicotine dependence, other tobacco product, uncomplicated: Secondary | ICD-10-CM | POA: Insufficient documentation

## 2024-06-10 DIAGNOSIS — Z800 Family history of malignant neoplasm of digestive organs: Secondary | ICD-10-CM | POA: Insufficient documentation

## 2024-06-10 DIAGNOSIS — Z923 Personal history of irradiation: Secondary | ICD-10-CM | POA: Insufficient documentation

## 2024-06-10 DIAGNOSIS — D649 Anemia, unspecified: Secondary | ICD-10-CM | POA: Insufficient documentation

## 2024-06-10 DIAGNOSIS — F419 Anxiety disorder, unspecified: Secondary | ICD-10-CM | POA: Insufficient documentation

## 2024-06-10 NOTE — Progress Notes (Signed)
 Nutrition Follow-up:  Patient with stage III colon cancer and stage I SCC of lung.  Currently not receiving treatment  Spoke with patient via phone for nutrition follow-up.  Reports that appetite is still not that good but she is trying to eat.  Says that she is drinking 350 calorie ensure plus TID.  Eating snacks and other foods in between as able.  Likes meat.  Today she has drank 2 ensure shakes, ate sausage and roast with carrots and potatoes that sister prepared for her.  Says that she has been trying to take the reglan  and nausea medication.  She did say that she received the coupons and information RD mailed.   Reports some depression.  States that she has a strong support system. Has previously declined [facility] services.     Medications: reviewed  Labs: no new  Anthropometrics:   No new weight  Home scale today 111 lb  114 lb on 5/30 130 lb on 12/09/23   NUTRITION DIAGNOSIS: Inadequate oral intake continues   INTERVENTION:  Continue to encourage nibbling q 2 hours Ensure plus TID (1050 calories, 48 g protein) Continue medications to help ease symptoms Patient has RD contact information    MONITORING, EVALUATION, GOAL: weight trends, intake   NEXT VISIT: phone call August 15   Nina Hoar B. Dasie SOLON, CSO, LDN Registered Dietitian [phone]

## 2024-06-15 ENCOUNTER — Other Ambulatory Visit: Payer: Self-pay

## 2024-06-15 ENCOUNTER — Inpatient Hospital Stay

## 2024-06-15 ENCOUNTER — Emergency Department

## 2024-06-15 ENCOUNTER — Inpatient Hospital Stay (HOSPITAL_BASED_OUTPATIENT_CLINIC_OR_DEPARTMENT_OTHER): Admitting: Oncology

## 2024-06-15 ENCOUNTER — Inpatient Hospital Stay (HOSPITAL_BASED_OUTPATIENT_CLINIC_OR_DEPARTMENT_OTHER): Admitting: Hospice and Palliative Medicine

## 2024-06-15 ENCOUNTER — Inpatient Hospital Stay
Admission: EM | Admit: 2024-06-15 | Discharge: 2024-06-18 | DRG: 393 | Disposition: A | Discharge status: Home / Self Care | Source: Ambulatory Visit | Attending: Internal Medicine | Admitting: Internal Medicine

## 2024-06-15 DIAGNOSIS — Z1152 Encounter for screening for COVID-19: Secondary | ICD-10-CM | POA: Diagnosis not present

## 2024-06-15 DIAGNOSIS — E785 Hyperlipidemia, unspecified: Secondary | ICD-10-CM | POA: Diagnosis present

## 2024-06-15 DIAGNOSIS — I110 Hypertensive heart disease with heart failure: Secondary | ICD-10-CM | POA: Diagnosis present

## 2024-06-15 DIAGNOSIS — F419 Anxiety disorder, unspecified: Secondary | ICD-10-CM | POA: Diagnosis present

## 2024-06-15 DIAGNOSIS — R52 Pain, unspecified: Secondary | ICD-10-CM | POA: Diagnosis not present

## 2024-06-15 DIAGNOSIS — I5022 Chronic systolic (congestive) heart failure: Secondary | ICD-10-CM | POA: Diagnosis present

## 2024-06-15 DIAGNOSIS — R1084 Generalized abdominal pain: Secondary | ICD-10-CM | POA: Diagnosis not present

## 2024-06-15 DIAGNOSIS — K651 Peritoneal abscess: Principal | ICD-10-CM | POA: Diagnosis present

## 2024-06-15 DIAGNOSIS — K529 Noninfective gastroenteritis and colitis, unspecified: Secondary | ICD-10-CM | POA: Diagnosis present

## 2024-06-15 DIAGNOSIS — I10 Essential (primary) hypertension: Secondary | ICD-10-CM | POA: Diagnosis not present

## 2024-06-15 DIAGNOSIS — Z7984 Long term (current) use of oral hypoglycemic drugs: Secondary | ICD-10-CM | POA: Diagnosis not present

## 2024-06-15 DIAGNOSIS — R935 Abnormal findings on diagnostic imaging of other abdominal regions, including retroperitoneum: Secondary | ICD-10-CM

## 2024-06-15 DIAGNOSIS — R110 Nausea: Secondary | ICD-10-CM | POA: Diagnosis not present

## 2024-06-15 DIAGNOSIS — R079 Chest pain, unspecified: Secondary | ICD-10-CM | POA: Diagnosis present

## 2024-06-15 DIAGNOSIS — E119 Type 2 diabetes mellitus without complications: Secondary | ICD-10-CM | POA: Diagnosis present

## 2024-06-15 DIAGNOSIS — Z794 Long term (current) use of insulin: Secondary | ICD-10-CM | POA: Diagnosis not present

## 2024-06-15 DIAGNOSIS — J4489 Other specified chronic obstructive pulmonary disease: Secondary | ICD-10-CM | POA: Diagnosis present

## 2024-06-15 DIAGNOSIS — G893 Neoplasm related pain (acute) (chronic): Secondary | ICD-10-CM

## 2024-06-15 DIAGNOSIS — R0602 Shortness of breath: Secondary | ICD-10-CM | POA: Diagnosis present

## 2024-06-15 DIAGNOSIS — Z800 Family history of malignant neoplasm of digestive organs: Secondary | ICD-10-CM | POA: Diagnosis not present

## 2024-06-15 DIAGNOSIS — D649 Anemia, unspecified: Secondary | ICD-10-CM | POA: Diagnosis not present

## 2024-06-15 DIAGNOSIS — F32A Depression, unspecified: Secondary | ICD-10-CM | POA: Diagnosis present

## 2024-06-15 DIAGNOSIS — Z515 Encounter for palliative care: Secondary | ICD-10-CM

## 2024-06-15 DIAGNOSIS — D509 Iron deficiency anemia, unspecified: Secondary | ICD-10-CM | POA: Diagnosis present

## 2024-06-15 DIAGNOSIS — Z923 Personal history of irradiation: Secondary | ICD-10-CM | POA: Diagnosis not present

## 2024-06-15 DIAGNOSIS — C799 Secondary malignant neoplasm of unspecified site: Secondary | ICD-10-CM | POA: Diagnosis present

## 2024-06-15 DIAGNOSIS — Z8249 Family history of ischemic heart disease and other diseases of the circulatory system: Secondary | ICD-10-CM

## 2024-06-15 DIAGNOSIS — Z833 Family history of diabetes mellitus: Secondary | ICD-10-CM

## 2024-06-15 DIAGNOSIS — I700 Atherosclerosis of aorta: Secondary | ICD-10-CM | POA: Diagnosis present

## 2024-06-15 DIAGNOSIS — R197 Diarrhea, unspecified: Secondary | ICD-10-CM | POA: Diagnosis not present

## 2024-06-15 DIAGNOSIS — K9189 Other postprocedural complications and disorders of digestive system: Secondary | ICD-10-CM | POA: Diagnosis present

## 2024-06-15 DIAGNOSIS — C189 Malignant neoplasm of colon, unspecified: Secondary | ICD-10-CM | POA: Diagnosis present

## 2024-06-15 DIAGNOSIS — Z79899 Other long term (current) drug therapy: Secondary | ICD-10-CM

## 2024-06-15 DIAGNOSIS — Z885 Allergy status to narcotic agent status: Secondary | ICD-10-CM

## 2024-06-15 DIAGNOSIS — I2510 Atherosclerotic heart disease of native coronary artery without angina pectoris: Secondary | ICD-10-CM | POA: Diagnosis present

## 2024-06-15 DIAGNOSIS — Z85118 Personal history of other malignant neoplasm of bronchus and lung: Secondary | ICD-10-CM | POA: Diagnosis not present

## 2024-06-15 DIAGNOSIS — J449 Chronic obstructive pulmonary disease, unspecified: Secondary | ICD-10-CM | POA: Diagnosis present

## 2024-06-15 DIAGNOSIS — F17290 Nicotine dependence, other tobacco product, uncomplicated: Secondary | ICD-10-CM | POA: Diagnosis present

## 2024-06-15 DIAGNOSIS — C184 Malignant neoplasm of transverse colon: Secondary | ICD-10-CM

## 2024-06-15 DIAGNOSIS — R109 Unspecified abdominal pain: Secondary | ICD-10-CM | POA: Diagnosis present

## 2024-06-15 DIAGNOSIS — Z8673 Personal history of transient ischemic attack (TIA), and cerebral infarction without residual deficits: Secondary | ICD-10-CM

## 2024-06-15 DIAGNOSIS — R112 Nausea with vomiting, unspecified: Secondary | ICD-10-CM | POA: Diagnosis not present

## 2024-06-15 DIAGNOSIS — A419 Sepsis, unspecified organism: Secondary | ICD-10-CM

## 2024-06-15 DIAGNOSIS — G629 Polyneuropathy, unspecified: Secondary | ICD-10-CM | POA: Diagnosis not present

## 2024-06-15 DIAGNOSIS — C3490 Malignant neoplasm of unspecified part of unspecified bronchus or lung: Secondary | ICD-10-CM | POA: Diagnosis present

## 2024-06-15 DIAGNOSIS — J439 Emphysema, unspecified: Secondary | ICD-10-CM | POA: Diagnosis present

## 2024-06-15 LAB — COMPREHENSIVE METABOLIC PANEL WITH GFR
ALT: 12 U/L (ref 0–44)
AST: 15 U/L (ref 15–41)
Albumin: 2.9 g/dL — ABNORMAL LOW (ref 3.5–5.0)
Alkaline Phosphatase: 44 U/L (ref 38–126)
Anion gap: 7 (ref 5–15)
BUN: 20 mg/dL (ref 8–23)
CO2: 25 mmol/L (ref 22–32)
Calcium: 9 mg/dL (ref 8.9–10.3)
Chloride: 106 mmol/L (ref 98–111)
Creatinine, Ser: 0.88 mg/dL (ref 0.44–1.00)
GFR, Estimated: >60 mL/min (ref >60)
Glucose, Bld: 134 mg/dL — ABNORMAL HIGH (ref 70–99)
Potassium: 4.5 mmol/L (ref 3.5–5.1)
Sodium: 138 mmol/L (ref 135–145)
Total Bilirubin: 0.5 mg/dL (ref 0.0–1.2)
Total Protein: 6.6 g/dL (ref 6.5–8.1)

## 2024-06-15 LAB — CBC WITH DIFFERENTIAL/PLATELET
Abs Immature Granulocytes: 0.1 K/uL — ABNORMAL HIGH (ref 0.00–0.07)
Basophils Absolute: 0.1 K/uL (ref 0.0–0.1)
Basophils Relative: 0 %
Eosinophils Absolute: 0 K/uL (ref 0.0–0.5)
Eosinophils Relative: 0 %
HCT: 25.5 % — ABNORMAL LOW (ref 36.0–46.0)
Hemoglobin: 7.6 g/dL — ABNORMAL LOW (ref 12.0–15.0)
Immature Granulocytes: 1 %
Lymphocytes Relative: 6 %
Lymphs Abs: 0.8 K/uL (ref 0.7–4.0)
MCH: 25.4 pg — ABNORMAL LOW (ref 26.0–34.0)
MCHC: 29.8 g/dL — ABNORMAL LOW (ref 30.0–36.0)
MCV: 85.3 fL (ref 80.0–100.0)
Monocytes Absolute: 0.6 K/uL (ref 0.1–1.0)
Monocytes Relative: 5 %
Neutro Abs: 11.2 K/uL — ABNORMAL HIGH (ref 1.7–7.7)
Neutrophils Relative %: 88 %
Platelets: 370 K/uL (ref 150–400)
RBC: 2.99 MIL/uL — ABNORMAL LOW (ref 3.87–5.11)
RDW: 16.5 % — ABNORMAL HIGH (ref 11.5–15.5)
WBC: 12.8 K/uL — ABNORMAL HIGH (ref 4.0–10.5)
nRBC: 0 % (ref 0.0–0.2)

## 2024-06-15 LAB — BRAIN NATRIURETIC PEPTIDE: B Natriuretic Peptide: 341.2 pg/mL — ABNORMAL HIGH (ref 0.0–100.0)

## 2024-06-15 LAB — CMP (CANCER CENTER ONLY)
ALT: 13 U/L (ref 0–44)
AST: 14 U/L — ABNORMAL LOW (ref 15–41)
Albumin: 2.9 g/dL — ABNORMAL LOW (ref 3.5–5.0)
Alkaline Phosphatase: 45 U/L (ref 38–126)
Anion gap: 9 (ref 5–15)
BUN: 21 mg/dL (ref 8–23)
CO2: 24 mmol/L (ref 22–32)
Calcium: 8.8 mg/dL — ABNORMAL LOW (ref 8.9–10.3)
Chloride: 101 mmol/L (ref 98–111)
Creatinine: 0.82 mg/dL (ref 0.44–1.00)
GFR, Estimated: >60 mL/min (ref >60)
Glucose, Bld: 143 mg/dL — ABNORMAL HIGH (ref 70–99)
Potassium: 4.1 mmol/L (ref 3.5–5.1)
Sodium: 134 mmol/L — ABNORMAL LOW (ref 135–145)
Total Bilirubin: 0.5 mg/dL (ref 0.0–1.2)
Total Protein: 6.4 g/dL — ABNORMAL LOW (ref 6.5–8.1)

## 2024-06-15 LAB — URINALYSIS, ROUTINE W REFLEX MICROSCOPIC
Bilirubin Urine: NEGATIVE
Glucose, UA: NEGATIVE mg/dL
Hgb urine dipstick: NEGATIVE
Ketones, ur: NEGATIVE mg/dL
Leukocytes,Ua: NEGATIVE
Nitrite: NEGATIVE
Protein, ur: NEGATIVE mg/dL
Specific Gravity, Urine: 1.026 (ref 1.005–1.030)
pH: 5 (ref 5.0–8.0)

## 2024-06-15 LAB — RESP PANEL BY RT-PCR (RSV, FLU A&B, COVID)  RVPGX2
Influenza A by PCR: NEGATIVE
Influenza B by PCR: NEGATIVE
Resp Syncytial Virus by PCR: NEGATIVE
SARS Coronavirus 2 by RT PCR: NEGATIVE

## 2024-06-15 LAB — PROTIME-INR
INR: 1 (ref 0.8–1.2)
Prothrombin Time: 13.9 s (ref 11.4–15.2)

## 2024-06-15 LAB — CBC
HCT: 26.9 % — ABNORMAL LOW (ref 36.0–46.0)
Hemoglobin: 8 g/dL — ABNORMAL LOW (ref 12.0–15.0)
MCH: 25.6 pg — ABNORMAL LOW (ref 26.0–34.0)
MCHC: 29.7 g/dL — ABNORMAL LOW (ref 30.0–36.0)
MCV: 85.9 fL (ref 80.0–100.0)
Platelets: 450 K/uL — ABNORMAL HIGH (ref 150–400)
RBC: 3.13 MIL/uL — ABNORMAL LOW (ref 3.87–5.11)
RDW: 16.5 % — ABNORMAL HIGH (ref 11.5–15.5)
WBC: 16 K/uL — ABNORMAL HIGH (ref 4.0–10.5)
nRBC: 0 % (ref 0.0–0.2)

## 2024-06-15 LAB — MAGNESIUM: Magnesium: 1.4 mg/dL — ABNORMAL LOW (ref 1.7–2.4)

## 2024-06-15 LAB — TROPONIN I (HIGH SENSITIVITY): Troponin I (High Sensitivity): 13 ng/L (ref <18)

## 2024-06-15 LAB — LACTIC ACID, PLASMA
Lactic Acid, Venous: 0.5 mmol/L (ref 0.5–1.9)
Lactic Acid, Venous: 0.7 mmol/L (ref 0.5–1.9)
Lactic Acid, Venous: 0.8 mmol/L (ref 0.5–1.9)

## 2024-06-15 LAB — LIPASE, BLOOD: Lipase: 29 U/L (ref 11–51)

## 2024-06-15 LAB — PHOSPHORUS: Phosphorus: 3.2 mg/dL (ref 2.5–4.6)

## 2024-06-15 LAB — APTT: aPTT: 34 s (ref 24–36)

## 2024-06-15 LAB — CBG MONITORING, ED: Glucose-Capillary: 116 mg/dL — ABNORMAL HIGH (ref 70–99)

## 2024-06-15 MED ORDER — ALBUTEROL SULFATE (2.5 MG/3ML) 0.083% IN NEBU: 3.0000 mL | INHALATION_SOLUTION | RESPIRATORY_TRACT | Status: DC | PRN

## 2024-06-15 MED ORDER — ACETAMINOPHEN 325 MG PO TABS: 650.0000 mg | ORAL_TABLET | Freq: Four times a day (QID) | ORAL | Status: DC | PRN

## 2024-06-15 MED ORDER — PIPERACILLIN-TAZOBACTAM 3.375 G IVPB
3.3750 g | Freq: Three times a day (TID) | INTRAVENOUS | Status: DC
  Administered 2024-06-16 – 2024-06-18 (×8): 3.375 g via INTRAVENOUS
  Filled 2024-06-15 (×8): qty 50

## 2024-06-15 MED ORDER — HEPARIN SODIUM (PORCINE) 5000 UNIT/ML IJ SOLN
5000.0000 [IU] | Freq: Three times a day (TID) | INTRAMUSCULAR | Status: DC
  Administered 2024-06-15 – 2024-06-18 (×8): 5000 [IU] via SUBCUTANEOUS
  Filled 2024-06-15 (×8): qty 1

## 2024-06-15 MED ORDER — HYDROMORPHONE HCL 1 MG/ML IJ SOLN
0.5000 mg | Freq: Once | INTRAMUSCULAR | Status: AC
  Administered 2024-06-15: 0.5 mg via INTRAVENOUS
  Filled 2024-06-15: qty 0.5

## 2024-06-15 MED ORDER — PIPERACILLIN-TAZOBACTAM 3.375 G IVPB
3.3750 g | Freq: Once | INTRAVENOUS | Status: AC
  Administered 2024-06-15: 3.375 g via INTRAVENOUS
  Filled 2024-06-15: qty 50

## 2024-06-15 MED ORDER — LACTATED RINGERS IV BOLUS: 1000.0000 mL | Freq: Once | INTRAVENOUS | Status: DC

## 2024-06-15 MED ORDER — ATORVASTATIN CALCIUM 20 MG PO TABS
40.0000 mg | ORAL_TABLET | Freq: Every day | ORAL | Status: DC
  Administered 2024-06-15 – 2024-06-17 (×3): 40 mg via ORAL
  Filled 2024-06-15 (×3): qty 2

## 2024-06-15 MED ORDER — PROCHLORPERAZINE EDISYLATE 10 MG/2ML IJ SOLN
10.0000 mg | Freq: Once | INTRAMUSCULAR | Status: AC
  Administered 2024-06-15: 10 mg via INTRAVENOUS
  Filled 2024-06-15: qty 2

## 2024-06-15 MED ORDER — HYDROMORPHONE HCL 1 MG/ML IJ SOLN
0.5000 mg | INTRAMUSCULAR | Status: DC | PRN
  Administered 2024-06-15 – 2024-06-17 (×2): 0.5 mg via INTRAVENOUS
  Filled 2024-06-15 (×2): qty 0.5

## 2024-06-15 MED ORDER — METOPROLOL SUCCINATE ER 50 MG PO TB24
50.0000 mg | ORAL_TABLET | Freq: Every day | ORAL | Status: DC
  Administered 2024-06-15 – 2024-06-18 (×4): 50 mg via ORAL
  Filled 2024-06-15 (×4): qty 1

## 2024-06-15 MED ORDER — MORPHINE SULFATE (PF) 2 MG/ML IV SOLN
2.0000 mg | Freq: Once | INTRAVENOUS | Status: AC
  Administered 2024-06-15: 2 mg via INTRAVENOUS
  Filled 2024-06-15: qty 1

## 2024-06-15 MED ORDER — HYDRALAZINE HCL 20 MG/ML IJ SOLN: 5.0000 mg | INTRAMUSCULAR | Status: DC | PRN

## 2024-06-15 MED ORDER — ONDANSETRON HCL 4 MG/2ML IJ SOLN
8.0000 mg | Freq: Once | INTRAMUSCULAR | Status: AC
  Administered 2024-06-15: 8 mg via INTRAVENOUS
  Filled 2024-06-15: qty 4

## 2024-06-15 MED ORDER — VENLAFAXINE HCL ER 75 MG PO CP24
75.0000 mg | ORAL_CAPSULE | Freq: Every day | ORAL | Status: DC
  Administered 2024-06-15 – 2024-06-18 (×4): 75 mg via ORAL
  Filled 2024-06-15 (×4): qty 1

## 2024-06-15 MED ORDER — PANTOPRAZOLE SODIUM 40 MG PO TBEC
40.0000 mg | DELAYED_RELEASE_TABLET | Freq: Every day | ORAL | Status: DC
  Administered 2024-06-15 – 2024-06-17 (×3): 40 mg via ORAL
  Filled 2024-06-15 (×3): qty 1

## 2024-06-15 MED ORDER — SODIUM CHLORIDE 0.9 % IV BOLUS
1000.0000 mL | Freq: Once | INTRAVENOUS | Status: AC
  Administered 2024-06-15: 1000 mL via INTRAVENOUS

## 2024-06-15 MED ORDER — FENOFIBRATE 54 MG PO TABS
54.0000 mg | ORAL_TABLET | Freq: Every day | ORAL | Status: DC
  Administered 2024-06-15 – 2024-06-17 (×3): 54 mg via ORAL
  Filled 2024-06-15 (×5): qty 1

## 2024-06-15 MED ORDER — GABAPENTIN 300 MG PO CAPS
600.0000 mg | ORAL_CAPSULE | Freq: Two times a day (BID) | ORAL | Status: DC
  Administered 2024-06-15 – 2024-06-18 (×6): 600 mg via ORAL
  Filled 2024-06-15 (×6): qty 2

## 2024-06-15 MED ORDER — INSULIN ASPART 100 UNIT/ML IJ SOLN
0.0000 [IU] | Freq: Three times a day (TID) | INTRAMUSCULAR | Status: DC
  Filled 2024-06-15: qty 1

## 2024-06-15 MED ORDER — INSULIN ASPART 100 UNIT/ML IJ SOLN: 0.0000 [IU] | Freq: Every day | INTRAMUSCULAR | Status: DC

## 2024-06-15 MED ORDER — CLONAZEPAM 1 MG PO TABS
1.0000 mg | ORAL_TABLET | Freq: Two times a day (BID) | ORAL | Status: DC
  Administered 2024-06-15 – 2024-06-18 (×6): 1 mg via ORAL
  Filled 2024-06-15 (×2): qty 1
  Filled 2024-06-15: qty 2
  Filled 2024-06-15 (×2): qty 1
  Filled 2024-06-15: qty 2

## 2024-06-15 MED ORDER — DM-GUAIFENESIN ER 30-600 MG PO TB12: 1.0000 | ORAL_TABLET | Freq: Two times a day (BID) | ORAL | Status: DC | PRN

## 2024-06-15 MED ORDER — OXYCODONE HCL 5 MG PO TABS
5.0000 mg | ORAL_TABLET | Freq: Four times a day (QID) | ORAL | Status: DC | PRN
  Administered 2024-06-15 – 2024-06-18 (×5): 5 mg via ORAL
  Filled 2024-06-15 (×5): qty 1

## 2024-06-15 MED ORDER — ONDANSETRON HCL 4 MG/2ML IJ SOLN
4.0000 mg | Freq: Three times a day (TID) | INTRAMUSCULAR | Status: DC | PRN
  Administered 2024-06-15 – 2024-06-18 (×2): 4 mg via INTRAVENOUS
  Filled 2024-06-15 (×2): qty 2

## 2024-06-15 MED ORDER — IOHEXOL 350 MG/ML SOLN
75.0000 mL | Freq: Once | INTRAVENOUS | Status: AC | PRN
  Administered 2024-06-15: 75 mL via INTRAVENOUS

## 2024-06-15 MED ORDER — SODIUM CHLORIDE 0.9 % IV SOLN: INTRAVENOUS | Status: DC

## 2024-06-15 MED ORDER — SODIUM CHLORIDE 0.9 % IV SOLN
12.5000 mg | Freq: Once | INTRAVENOUS | Status: AC
  Administered 2024-06-15: 12.5 mg via INTRAVENOUS
  Filled 2024-06-15: qty 12.5

## 2024-06-15 NOTE — Progress Notes (Signed)
 Pt requesting to lay down not feeling well. Pt brought back to IV fluid clinic right away. Completed assessment obtained vitals and accessed port and drew labs.   Notified Dr. Jacobo  Pt is in fluid clinic having significant lower back pain, and sweating. husband reporting pt has had abdominal pain that comes and goes, sometimes can't keep things down for a few days and then is fine for a while. Husband reports, that this has been going on for 3 months now. Vital signs are stable with B/P 132/67 heart rate 112 no fever 97.7 oral, respirations 18, and on room air 97 percent. Pt transferred to clinic room 14 @ 1000 for exam with Dr. Jacobo.

## 2024-06-15 NOTE — Discharge Instructions (Signed)
 Please make sure to keep yourself hydrated.

## 2024-06-15 NOTE — Consult Note (Signed)
 [facility] SURGICAL CONSULTATION NOTE (initial) - cpt: 99254/5   HISTORY OF PRESENT ILLNESS (HPI):  73 y.o. female presented to [facility] ED June 15, 2024 for referred by her oncology office for evaluation of nausea, vomiting, abdominal pain. Patient reports that she has worsening abdominal pain for about 4 weeks, associated with nausea and intermittent vomiting with nonbilious nonbloody vomiting.  Her abdominal pain is located in the central abdomen, constant, aching, moderate to severe, radiating to the back, not aggravated or alleviated by any known factors. She states that she has chronic diarrhea at baseline which has not changed.  No fever or chills.    Medical history significant of colon cancer s/p of chemotherapy, s/p of partial gastrotomy, s/p of right hemicolectomy), HTN, HLD, DM, COPD/asthma, sCHF with EF 45-50%, stroke, depression with anxiety, anemia.   Data reviewed independently and ED Course: pt was found to have WBC 16.0, GFR> 60, troponin 13, lactic acid 0.5, negative PCR for COVID, flu and RSV, magnesium  1.4, potassium 4.5, phosphorus 3.2.  CTA negative for PE.  CT abdomen/pelvis that showed possible intra-abdominal abscess.  Patient is admitted to PCU as inpatient.    Review of oncology notes:  Patient underwent complete surgical resection at [facility] on May 23, 2022. Large colon mass was noted to partially invade stomach as well as surrounding mesentery. PET scan did not reveal any metastatic disease other than a positive lung nodule which was confirmed to be a second primary. Patient completed her final cycle of adjuvant FOLFOX on December 23, 2022. PET scan results from May 20, 2023 with clear progression of disease. Patient was seen at [facility] and determined not to be a surgical candidate. Patient then received 12 cycles of FOLFIRI between June 09, 2023 and March 08, 2024.  She presented to the oncology office yesterday and was referred to ED.    Surgery is consulted by ED physician Dr. Ernest in this context for evaluation and management of abd pain, I reviewed her CT imaging last night, and concluded this was not an acute process necessitating emergent surgery.  She has had good pain control since she has been in the hospital, resting well.  PAST MEDICAL HISTORY (PMH):  Past Medical History:  Diagnosis Date   Anemia    Anxiety    Asthma    Cancer (HCC)    colon   CHF (congestive heart failure) (HCC)    COPD (chronic obstructive pulmonary disease) (HCC)    Diabetes mellitus without complication (HCC)    Hyperlipidemia    Hypertension    Stroke (HCC)      PAST SURGICAL HISTORY (PSH):  Past Surgical History:  Procedure Laterality Date   BIOPSY  06/26/2023   Procedure: BIOPSY;  Surgeon: Maryruth Ole DASEN, MD;  Location: [facility] ENDOSCOPY;  Service: Endoscopy;;   COLONOSCOPY N/A 06/26/2023   Procedure: COLONOSCOPY;  Surgeon: Maryruth Ole DASEN, MD;  Location: [facility] ENDOSCOPY;  Service: Endoscopy;  Laterality: N/A;   COLONOSCOPY WITH PROPOFOL  N/A 02/03/2022   Procedure: COLONOSCOPY WITH PROPOFOL ;  Surgeon: Unk Corinn Skiff, MD;  Location: [facility] ENDOSCOPY;  Service: Gastroenterology;  Laterality: N/A;   ESOPHAGOGASTRODUODENOSCOPY N/A 06/26/2023   Procedure: ESOPHAGOGASTRODUODENOSCOPY (EGD);  Surgeon: Maryruth Ole DASEN, MD;  Location: [facility] ENDOSCOPY;  Service: Endoscopy;  Laterality: N/A;   ESOPHAGOGASTRODUODENOSCOPY (EGD) WITH PROPOFOL  N/A 02/03/2022   Procedure: ESOPHAGOGASTRODUODENOSCOPY (EGD) WITH PROPOFOL ;  Surgeon: Unk Corinn Skiff, MD;  Location: [facility] ENDOSCOPY;  Service: Gastroenterology;  Laterality: N/A;   GIVENS CAPSULE STUDY N/A 02/03/2022  Procedure: GIVENS CAPSULE STUDY;  Surgeon: Unk Corinn Skiff, MD;  Location: [facility] ENDOSCOPY;  Service: Gastroenterology;  Laterality: N/A;  possible Capsule Study   IR IMAGING GUIDED PORT INSERTION  06/18/2022   POLYPECTOMY  06/26/2023   Procedure: POLYPECTOMY;  Surgeon:  Maryruth Ole DASEN, MD;  Location: [facility] ENDOSCOPY;  Service: Endoscopy;;     MEDICATIONS:  Prior to Admission medications   Medication Sig Start Date End Date Taking? Authorizing Provider  acetaminophen  (TYLENOL ) 325 MG tablet Take 650 mg by mouth every 6 (six) hours as needed.   Yes [provider]  albuterol  (VENTOLIN  HFA) 108 (90 Base) MCG/ACT inhaler Inhale 1-2 puffs into the lungs every 4 (four) hours as needed for wheezing or shortness of breath. Inhale into the lungs. 01/19/22  Yes [provider]  atorvastatin  (LIPITOR) 40 MG tablet Take 40 mg by mouth daily. 12/12/21  Yes [provider]  clonazePAM  (KLONOPIN ) 1 MG tablet Take 1 mg by mouth 2 (two) times daily.   Yes [provider]  fenofibrate  54 MG tablet Take 54 mg by mouth daily. 10/04/22  Yes [provider]  gabapentin  (NEURONTIN ) 600 MG tablet Take by mouth 2 (two) times daily. 01/01/22  Yes [provider]  lisinopril (ZESTRIL) 20 MG tablet Take 20 mg by mouth daily.   Yes [provider]  metoCLOPramide  (REGLAN ) 10 MG tablet Take 1 tablet (10 mg total) by mouth in the morning, at noon, and at bedtime. 05/04/24  Yes Jacobo Evalene PARAS, MD  metoprolol  succinate (TOPROL -XL) 50 MG 24 hr tablet Take 50 mg by mouth daily. Take with or immediately following a meal.   Yes [provider]  OLANZapine  (ZYPREXA ) 10 MG tablet Take 0.5-1 tablets (5-10 mg total) by mouth at bedtime as needed (nausea). 07/09/23  Yes Borders, Fonda SAUNDERS, NP  ondansetron  (ZOFRAN ) 8 MG tablet Take 1 tablet (8 mg total) by mouth every 8 (eight) hours as needed for nausea or vomiting. 12/09/23  Yes Jacobo Evalene PARAS, MD  oxyCODONE  (OXY IR/ROXICODONE ) 5 MG immediate release tablet Take 1 tablet (5 mg total) by mouth every 4 (four) hours as needed for severe pain (pain score 7-10). 05/31/24  Yes Jacobo Evalene PARAS, MD  pantoprazole  (PROTONIX ) 40 MG tablet Take 1 tablet (40 mg total) by mouth daily. 03/11/22  06/15/24 Yes Hackney, Ellouise LABOR, FNP  ranolazine  (RANEXA ) 500 MG 12 hr tablet Take 500 mg by mouth 2 (two) times daily.   Yes [provider]  sitaGLIPtin-metformin (JANUMET) 50-1000 MG tablet Take 1 tablet by mouth daily.   Yes [provider]  traMADol (ULTRAM) 50 MG tablet Take 50 mg by mouth every 6 (six) hours as needed. 03/24/24  Yes [provider]  venlafaxine  XR (EFFEXOR -XR) 75 MG 24 hr capsule Take 75 mg by mouth daily. 12/07/21  Yes [provider]  insulin  lispro (HUMALOG) 100 UNIT/ML injection Inject 1-10 Units into the skin 3 (three) times daily before meals. Per sliding scale Patient not taking: Reported on 06/15/2024    [provider]  LANTUS  SOLOSTAR 100 UNIT/ML Solostar Pen Inject 12 Units into the skin at bedtime. Patient not taking: Reported on 06/15/2024 01/17/22   [provider]  Magnesium  Citrate 200 MG TABS Take 1 tablet by mouth in the morning and at bedtime. 250 twice daily Patient not taking: Reported on 11/18/2023    [provider]  megestrol  (MEGACE ) 40 MG tablet Take 1 tablet (40 mg total) by mouth daily. Patient not taking: Reported  on 04/29/2024 09/30/23   Jacobo Evalene PARAS, MD  prochlorperazine  (COMPAZINE ) 10 MG tablet Take 1 tablet (10 mg total) by mouth every 6 (six) hours as needed (Nausea or vomiting). 06/10/22 07/10/22  Jacobo Evalene PARAS, MD     ALLERGIES:  Allergies  Allergen Reactions   Codeine Rash and Dermatitis    Happened in 1970s     SOCIAL HISTORY:  Social History   Socioeconomic History   Marital status: Married    Spouse name: Not on file   Number of children: Not on file   Years of education: Not on file   Highest education level: Not on file  Occupational History   Not on file  Tobacco Use   Smoking status: Former    Types: E-cigarettes    Quit date: 12/08/2022    Years since quitting: 1.5   Smokeless tobacco: Not on file   Tobacco comments:    Quit Cigarettes 12/2021   Vaping Use   Vaping status: Every Day   Substances: Nicotine, THC  Substance and Sexual Activity   Alcohol use: Yes    Comment: rare   Drug use: Yes    Types: Marijuana   Sexual activity: Not on file  Other Topics Concern   Not on file  Social History Narrative   Not on file   Social Drivers of Health   Financial Resource Strain: Low Risk  (05/30/2022)   Received from [facility]   Overall Financial Resource Strain (CARDIA)  Food Insecurity: No Food Insecurity (10/11/2022)   Hunger Vital Sign    Worried About Running Out of Food in the Last Year: Never true    Ran Out of Food in the Last Year: Never true  Transportation Needs: No Transportation Needs (10/11/2022)   PRAPARE - Administrator, Civil Service (Medical): No    Lack of Transportation (Non-Medical): No  Physical Activity: Insufficiently Active (03/27/2022)   Received from [facility]   Exercise Vital Sign    On average, how many days per week do you engage in moderate to strenuous exercise (like a brisk walk)?: 7 days    On average, how many minutes do you engage in exercise at this level?: 20 min  Stress: Not on file  Social Connections: Socially Isolated (03/27/2022)   Received from [facility]   Social Connection and Isolation Panel    In a typical week, how many times do you talk on the phone with family, friends, or neighbors?: Never    How often do you get together with friends or relatives?: Never    How often do you attend church or religious services?: Never    Do you belong to any clubs or organizations such as church groups, unions, fraternal or athletic groups, or school groups?: No    How often do you attend meetings of the clubs or organizations you belong to?: Never    Are you married, widowed, divorced, separated, never married, or living with a partner?: Married  Intimate Partner Violence: Not At Risk (10/11/2022)   Humiliation,  Afraid, Rape, and Kick questionnaire    Fear of Current or Ex-Partner: No    Emotionally Abused: No    Physically Abused: No    Sexually Abused: No     FAMILY HISTORY:  Family History  Problem Relation Age of Onset   Diabetes Mother    Heart attack Father    Colon cancer Father  REVIEW OF SYSTEMS:  Review of Systems  Respiratory:  Positive for shortness of breath.   Cardiovascular:  Positive for chest pain.  Genitourinary:  Negative for dysuria, frequency and urgency.    VITAL SIGNS:  Temp:  [97.6 F (36.4 C)-97.8 F (36.6 C)] 97.8 F (36.6 C) (07/16 2211) Pulse Rate:  [91-121] 103 (07/16 2211) Resp:  [7-18] 18 (07/16 2211) BP: (128-153)/(67-87) 130/81 (07/16 2211) SpO2:  [93 %-100 %] 100 % (07/16 2211) Weight:  [52.6 kg] 52.6 kg (07/16 1309)     Height: 5' 4 (162.6 cm) Weight: 52.6 kg BMI (Calculated): 19.9   INTAKE/OUTPUT:  No intake/output data recorded.  PHYSICAL EXAM:  Physical Exam Blood pressure 130/81, pulse (!) 103, temperature 97.8 F (36.6 C), temperature source Oral, resp. rate 18, height 5' 4 (1.626 m), weight 52.6 kg, SpO2 100%. Last Weight  Most recent update: 06/15/2024  1:09 PM    Weight  52.6 kg (116 lb)             CONSTITUTIONAL: Well developed, and adequately nourished, appropriately responsive and aware without distress.  Awakened from nap. EYES: Sclera non-icteric.   EARS, NOSE, MOUTH AND THROAT:  The oropharynx is clear. Oral mucosa is pink and moist.    Hearing is intact to voice.  NECK: Trachea is midline, and there is no jugular venous distension.  LYMPH NODES:  Lymph nodes in the neck are not enlarged. RESPIRATORY:  Pt has right upper chest Port.  Normal respiratory effort without pathologic use of accessory muscles. CARDIOVASCULAR:  Well perfused.  GI: The abdomen is soft, nontender, and nondistended.  There is significant and extensive surgical scarring, including palpable mesh mostly in the left upper quadrant, I believe  there may be palpable masses present.  There is no involuntary guarding or rebound.  Nothing to indicate an inflammatory/peritonitic process.  There were normal bowel sounds. MUSCULOSKELETAL:  Warm without edema.  SKIN: Skin turgor is normal. No pathologic skin lesions appreciated.  NEUROLOGIC:  Motor and sensation appear grossly normal.  Cranial nerves are grossly without defect. PSYCH:  Alert and oriented to person, place and time. Affect is appropriate for situation.  Data Reviewed I have personally reviewed what is currently available of the patient's imaging, recent labs and medical records.    Labs:     Latest Ref Rng & Units 06/15/2024   12:37 PM 06/15/2024    9:41 AM 04/04/2024    2:16 PM  CBC  WBC 4.0 - 10.5 K/uL 16.0  12.8  10.5   Hemoglobin 12.0 - 15.0 g/dL 8.0  7.6  8.9   Hematocrit 36.0 - 46.0 % 26.9  25.5  28.7   Platelets 150 - 400 K/uL 450  370  275       Latest Ref Rng & Units 06/15/2024   12:37 PM 06/15/2024    9:41 AM 04/04/2024    2:13 PM  CMP  Glucose 70 - 99 mg/dL 865  856  856   BUN 8 - 23 mg/dL 20  21  27    Creatinine 0.44 - 1.00 mg/dL 9.11  9.17  9.25   Sodium 135 - 145 mmol/L 138  134  134   Potassium 3.5 - 5.1 mmol/L 4.5  4.1  4.6   Chloride 98 - 111 mmol/L 106  101  103   CO2 22 - 32 mmol/L 25  24  23    Calcium  8.9 - 10.3 mg/dL 9.0  8.8  8.8   Total Protein 6.5 - 8.1  g/dL 6.6  6.4  6.4   Total Bilirubin 0.0 - 1.2 mg/dL 0.5  0.5  0.3   Alkaline Phos 38 - 126 U/L 44  45  39   AST 15 - 41 U/L 15  14  12    ALT 0 - 44 U/L 12  13  10       Imaging studies:   Last 24 hrs: CT ABDOMEN PELVIS W CONTRAST Result Date: 06/15/2024 CLINICAL DATA:  Stage IV colon cancer. Abdominal pain, back pain and nausea and vomiting. * Tracking Code: BO * EXAM: CT ANGIOGRAPHY CHEST CT ABDOMEN AND PELVIS WITH CONTRAST TECHNIQUE: Multidetector CT imaging of the chest was performed using the standard protocol during bolus administration of intravenous contrast. Multiplanar CT image  reconstructions and MIPs were obtained to evaluate the vascular anatomy. Multidetector CT imaging of the abdomen and pelvis was performed using the standard protocol during bolus administration of intravenous contrast. RADIATION DOSE REDUCTION: This exam was performed according to the departmental dose-optimization program which includes automated exposure control, adjustment of the mA and/or kV according to patient size and/or use of iterative reconstruction technique. CONTRAST:  75mL OMNIPAQUE  IOHEXOL  350 MG/ML SOLN COMPARISON:  PET 04/22/2024. FINDINGS: CTA CHEST FINDINGS Cardiovascular: Negative for pulmonary embolus. Right IJ Port-A-Cath terminates in the right atrium. Atherosclerotic calcification of the aorta, aortic valve and coronary arteries. Enlarged pulmonic trunk and heart. No pericardial effusion. Mediastinum/Nodes: No pathologically enlarged mediastinal, hilar or axillary lymph nodes. Esophagus is grossly unremarkable. Lungs/Pleura: Centrilobular and paraseptal emphysema. Vague ground-glass in the lingula (6/82), likely scarring, unchanged from 04/22/2024. Scarring in the superior segment left lower lobe. No pleural fluid. Airway is unremarkable. Musculoskeletal: Healing or healed pathologic fracture of the posterior left fifth rib, as before. Old right prick rib fractures. Review of the MIP images confirms the above findings. CT ABDOMEN and PELVIS FINDINGS Hepatobiliary: Small cysts in the inferior right hepatic lobe. No specific follow-up necessary. Gallstone. No biliary ductal dilatation. Pancreas: Negative. Spleen: Negative. Adrenals/Urinary Tract: Slight thickening of the medial limb right adrenal gland. Heterogeneous left adrenal mass measures 2.5 x 3.7 cm, previously characterized as a myelolipoma. No specific follow-up necessary. Small low-attenuation lesion in the right kidney. No specific follow-up necessary. Kidneys are otherwise unremarkable. Ureters are decompressed. Bladder is grossly  unremarkable. Stomach/Bowel: Postoperative changes in the mid and distal stomach with antral wall thickening and irregularity, hypermetabolic on PET 04/22/2024. Associated outpouching along the inferior margin of the gastric antrum contains fluid and air (2/33). Extended right hemicolectomy with an enterocolonic anastomosis in the left mid abdomen (2/32). Separate small bowel anastomosis in the high left anatomic pelvis with a large collection somewhat thick-walled fluid and air along the medial margin of the enterocolonic anastomosis, measuring 4.5 x 5.3 cm (2/46). 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 Vascular/Lymphatic: Atherosclerotic calcification of the aorta. 10 mm left periaortic lymph node (2/32), stable. Similar small to borderline enlarged periportal lymph nodes. Reproductive: No adnexal mass. Other: Scattered ascites. No free air. Mesh repair along the ventral abdominal wall. Musculoskeletal: Degenerative changes in the spine. Review of the MIP images confirms the above findings. IMPRESSION: 1. Negative for pulmonary embolus. 2. Partial gastrectomy with fold thickening, irregularity and an outpouching along the anastomotic margin, worrisome for perforation/abscess and possible disease recurrence. 3. Small bowel resection and extended right hemicolectomy with an extraluminal collection of fluid and air associated with an enteric anastomosis in the high left anatomic pelvis, suggestive of an anastomotic leak and abscess. Again, disease recurrence may be  the underlying etiology. 4. 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 5. Healing or healed pathologic fracture of the posterior left fifth rib, as discussed on PET 04/22/2024. 6. Cholelithiasis. 7. Left adrenal mass, previously characterized as a myelolipoma. 8. Scattered ascites. 9. Aortic atherosclerosis (ICD10-I70.0). Coronary artery calcification. 10. Enlarged pulmonic trunk, indicative of pulmonary  arterial hypertension. 11.  Emphysema (ICD10-J43.9). Electronically Signed   By: Newell Eke M.D.   On: 06/15/2024 16:04   CT Angio Chest PE W/Cm &/Or Wo Cm Result Date: 06/15/2024 CLINICAL DATA:  Stage IV colon cancer. Abdominal pain, back pain and nausea and vomiting. * Tracking Code: BO * EXAM: CT ANGIOGRAPHY CHEST CT ABDOMEN AND PELVIS WITH CONTRAST TECHNIQUE: Multidetector CT imaging of the chest was performed using the standard protocol during bolus administration of intravenous contrast. Multiplanar CT image reconstructions and MIPs were obtained to evaluate the vascular anatomy. Multidetector CT imaging of the abdomen and pelvis was performed using the standard protocol during bolus administration of intravenous contrast. RADIATION DOSE REDUCTION: This exam was performed according to the departmental dose-optimization program which includes automated exposure control, adjustment of the mA and/or kV according to patient size and/or use of iterative reconstruction technique. CONTRAST:  75mL OMNIPAQUE  IOHEXOL  350 MG/ML SOLN COMPARISON:  PET 04/22/2024. FINDINGS: CTA CHEST FINDINGS Cardiovascular: Negative for pulmonary embolus. Right IJ Port-A-Cath terminates in the right atrium. Atherosclerotic calcification of the aorta, aortic valve and coronary arteries. Enlarged pulmonic trunk and heart. No pericardial effusion. Mediastinum/Nodes: No pathologically enlarged mediastinal, hilar or axillary lymph nodes. Esophagus is grossly unremarkable. Lungs/Pleura: Centrilobular and paraseptal emphysema. Vague ground-glass in the lingula (6/82), likely scarring, unchanged from 04/22/2024. Scarring in the superior segment left lower lobe. No pleural fluid. Airway is unremarkable. Musculoskeletal: Healing or healed pathologic fracture of the posterior left fifth rib, as before. Old right prick rib fractures. Review of the MIP images confirms the above findings. CT ABDOMEN and PELVIS FINDINGS Hepatobiliary: Small cysts  in the inferior right hepatic lobe. No specific follow-up necessary. Gallstone. No biliary ductal dilatation. Pancreas: Negative. Spleen: Negative. Adrenals/Urinary Tract: Slight thickening of the medial limb right adrenal gland. Heterogeneous left adrenal mass measures 2.5 x 3.7 cm, previously characterized as a myelolipoma. No specific follow-up necessary. Small low-attenuation lesion in the right kidney. No specific follow-up necessary. Kidneys are otherwise unremarkable. Ureters are decompressed. Bladder is grossly unremarkable. Stomach/Bowel: Postoperative changes in the mid and distal stomach with antral wall thickening and irregularity, hypermetabolic on PET 04/22/2024. Associated outpouching along the inferior margin of the gastric antrum contains fluid and air (2/33). Extended right hemicolectomy with an enterocolonic anastomosis in the left mid abdomen (2/32). Separate small bowel anastomosis in the high left anatomic pelvis with a large collection somewhat thick-walled fluid and air along the medial margin of the enterocolonic anastomosis, measuring 4.5 x 5.3 cm (2/46). 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 Vascular/Lymphatic: Atherosclerotic calcification of the aorta. 10 mm left periaortic lymph node (2/32), stable. Similar small to borderline enlarged periportal lymph nodes. Reproductive: No adnexal mass. Other: Scattered ascites. No free air. Mesh repair along the ventral abdominal wall. Musculoskeletal: Degenerative changes in the spine. Review of the MIP images confirms the above findings. IMPRESSION: 1. Negative for pulmonary embolus. 2. Partial gastrectomy with fold thickening, irregularity and an outpouching along the anastomotic margin, worrisome for perforation/abscess and possible disease recurrence. 3. Small bowel resection and extended right hemicolectomy with an extraluminal collection of fluid and air associated with an enteric anastomosis in the high left  anatomic pelvis, suggestive of an anastomotic leak and abscess. Again, disease recurrence may be the underlying etiology. 4. 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 5. Healing or healed pathologic fracture of the posterior left fifth rib, as discussed on PET 04/22/2024. 6. Cholelithiasis. 7. Left adrenal mass, previously characterized as a myelolipoma. 8. Scattered ascites. 9. Aortic atherosclerosis (ICD10-I70.0). Coronary artery calcification. 10. Enlarged pulmonic trunk, indicative of pulmonary arterial hypertension. 11.  Emphysema (ICD10-J43.9). Electronically Signed   By: Newell Eke M.D.   On: 06/15/2024 16:04     Assessment/Plan:  73 y.o. female with recurrent metastatic carcinoma, complicated by pertinent comorbidities including:  Patient Active Problem List   Diagnosis Date Noted   Abdominal pain 06/15/2024   Intra-abdominal abscess (HCC) 06/15/2024   HTN (hypertension) 06/15/2024   Diabetes mellitus without complication (HCC) 06/15/2024   AKI (acute kidney injury) (HCC) 10/11/2022   Pressure injury of buttock, unstageable (HCC) 10/11/2022   Chronic systolic CHF (congestive heart failure) (HCC) 10/11/2022   Acute metabolic encephalopathy 10/11/2022   Lung cancer (HCC) 10/11/2022   Nonspecific abnormal results of pulmonary system function study 03/19/2022   Adjustment disorder with depressed mood 03/19/2022   Arthritis 03/19/2022   Colon cancer (HCC) 02/04/2022   Duodenal ulcer without hemorrhage or perforation and without obstruction 02/03/2022   Erosive gastropathy 02/03/2022   Colonic mass 02/03/2022   Hypokalemia 02/01/2022   Diabetes mellitus type 2, controlled, without complications (HCC) 01/29/2022   Chronic obstructive pulmonary disease (HCC) 01/29/2022   New onset of HFrEF 01/29/2022   Hyperlipidemia 01/29/2022   Anxiety and depression 01/29/2022   Iron  deficiency anemia 01/29/2022   Acute respiratory failure with hypoxia (HCC)     - She is  reluctant to consider surgical intervention, and so am I.  - I see no evidence of any urgent procedure that would alleviate any septic picture, or improve bowel function at this point in time.  - Although she has presented with some abdominal pain, considering her significant surgical history, it is extremely difficult to anticipate improvement in his pain considering all the prior hernia surgeries, resections and with this persisting recurrence.  I do not believe I have anything worthy to offer this unfortunate lady, we will be glad to assist and feel free to recall me should you believe I can be of help.  All of the above findings and recommendations were discussed with the patient and  family(if present), and all of patient's and present family's questions were answered to their expressed satisfaction.  I personally spent a total of 60 minutes in the care of the patient today including preparing to see the patient, getting/reviewing separately obtained history, performing a medically appropriate exam/evaluation, documenting clinical information in the EHR, independently interpreting results, and communicating results.    Thank you for the opportunity to participate in this patient's care.   -- Honor Leghorn, M.D., FACS 06/15/2024, 10:12 PM

## 2024-06-15 NOTE — Progress Notes (Signed)
  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  INTERVAL HISTORY: Patient returns to clinic today for repeat laboratory work and further evaluation.  She reports over the last 3 to 4 weeks her abdominal pain has become more significant, she has persistent nausea, and increasing weakness and fatigue. She has a chronic peripheral neuropathy, but no other neurologic complaints.  She denies any recent fevers or illnesses.  She has no chest pain, shortness of breath, cough, or hemoptysis.  She denies any vomiting, constipation, or diarrhea.  She has no melena or hematochezia.  She has no urinary complaints.  Patient feels generally terrible, but offers no further specific complaints today.  REVIEW OF SYSTEMS:   Review of Systems  Constitutional:  Positive for malaise/fatigue. Negative for fever and weight loss.  Respiratory: Negative.  Negative for cough, hemoptysis and shortness of breath.   Cardiovascular: Negative.  Negative for chest pain and leg swelling.  Gastrointestinal:  Positive for abdominal pain and nausea. Negative for blood in stool, constipation, diarrhea, melena and vomiting.  Genitourinary: Negative.  Negative for dysuria.  Musculoskeletal: Negative.  Negative for back pain and falls.  Skin: Negative.  Negative for rash.  Neurological:  Positive for sensory change and weakness. Negative for dizziness, focal weakness and headaches.  Psychiatric/Behavioral: Negative.  The patient is not  nervous/anxious.     As per HPI. Otherwise, a complete review of systems is negative.  PAST MEDICAL HISTORY: Past Medical History:  Diagnosis Date   Anemia    Anxiety    Asthma    Cancer (HCC)    colon   CHF (congestive heart failure) (HCC)    COPD (chronic obstructive pulmonary disease) (HCC)    Diabetes mellitus without complication (HCC)    Hyperlipidemia    Hypertension    Stroke [facility])     PAST SURGICAL HISTORY: Past Surgical History:  Procedure Laterality Date   BIOPSY  06/26/2023   Procedure: BIOPSY;  Surgeon: Maryruth Ole DASEN, MD;  Location: [facility] ENDOSCOPY;  Service: Endoscopy;;   COLONOSCOPY N/A 06/26/2023   Procedure: COLONOSCOPY;  Surgeon: Maryruth Ole DASEN, MD;  Location: [facility] ENDOSCOPY;  Service: Endoscopy;  Laterality: N/A;   COLONOSCOPY WITH PROPOFOL  N/A 02/03/2022   Procedure: COLONOSCOPY WITH PROPOFOL ;  Surgeon: Unk Corinn Skiff, MD;  Location: [facility] ENDOSCOPY;  Service: Gastroenterology;  Laterality: N/A;   ESOPHAGOGASTRODUODENOSCOPY N/A 06/26/2023   Procedure: ESOPHAGOGASTRODUODENOSCOPY (EGD);  Surgeon: Maryruth Ole DASEN, MD;  Location: [facility] ENDOSCOPY;  Service: Endoscopy;  Laterality: N/A;   ESOPHAGOGASTRODUODENOSCOPY (EGD) WITH PROPOFOL  N/A 02/03/2022   Procedure: ESOPHAGOGASTRODUODENOSCOPY (EGD) WITH PROPOFOL ;  Surgeon: Unk Corinn Skiff, MD;  Location: [facility] ENDOSCOPY;  Service: Gastroenterology;  Laterality: N/A;   GIVENS CAPSULE STUDY N/A 02/03/2022   Procedure: GIVENS CAPSULE STUDY;  Surgeon: Unk Corinn Skiff, MD;  Location[facility] ENDOSCOPY;  Service: Gastroenterology;  Laterality: N/A;  possible Capsule Study   IR IMAGING GUIDED PORT INSERTION  06/18/2022   POLYPECTOMY  06/26/2023   Procedure: POLYPECTOMY;  Surgeon: Maryruth Ole DASEN, MD;  Location: [facility] ENDOSCOPY;  Service: Endoscopy;;    FAMILY HISTORY: Family History  Problem Relation Age of Onset   Diabetes Mother    Heart attack Father    Colon cancer Father     ADVANCED DIRECTIVES  (Y/N):  N  HEALTH MAINTENANCE: Social History   Tobacco Use   Smoking status: Former    Types: E-cigarettes    Quit date: 12/08/2022    Years since quitting: 1.5   Tobacco comments:    Quit Cigarettes 12/2021  Vaping Use   Vaping status: Every Day   Substances: Nicotine, THC  Substance Use Topics   Alcohol use: Yes    Comment: rare   Drug use: Yes    Types: Marijuana     Colonoscopy:  PAP:  Bone density:  Lipid panel:  Allergies  Allergen Reactions   Codeine Rash    Happened in 1970s    Current Outpatient Medications  Medication Sig Dispense Refill   acetaminophen  (TYLENOL ) 325 MG tablet Take 650 mg by mouth every 6 (six) hours as needed.     albuterol  (VENTOLIN  HFA) 108 (90 Base) MCG/ACT inhaler Inhale into the lungs.     atorvastatin  (LIPITOR) 40 MG tablet Take 40 mg by mouth daily.     clonazePAM  (KLONOPIN ) 1 MG tablet Take 1 mg by mouth 2 (two) times daily.     fenofibrate  54 MG tablet Take 54 mg by mouth daily.     gabapentin  (NEURONTIN ) 600 MG tablet Take by mouth 2 (two) times daily.     insulin  lispro (HUMALOG) 100 UNIT/ML injection Inject 1-10 Units into the skin 3 (three) times daily before meals. Per sliding scale (Patient not taking: Reported on 08/25/2023)     LANTUS  SOLOSTAR 100 UNIT/ML Solostar Pen Inject 12 Units into the skin at bedtime. (Patient not taking: Reported on 04/29/2024)     lisinopril (ZESTRIL) 20 MG tablet Take 20 mg by mouth daily.     Magnesium  Citrate 200 MG TABS Take 1 tablet by mouth in the morning and at bedtime. 250 twice daily (Patient not taking: Reported on 11/18/2023)     megestrol  (MEGACE ) 40 MG tablet Take 1 tablet (40 mg total) by mouth daily. (Patient not taking: Reported on 04/29/2024) 30 tablet 0   metoCLOPramide  (REGLAN ) 10 MG tablet Take 1 tablet (10 mg total) by mouth in the morning, at noon, and at bedtime. 90 tablet 1   metoprolol  succinate (TOPROL -XL) 50 MG 24 hr tablet Take 50 mg by mouth daily. Take with or immediately  following a meal.     OLANZapine  (ZYPREXA ) 10 MG tablet Take 0.5-1 tablets (5-10 mg total) by mouth at bedtime as needed (nausea). 30 tablet 1   ondansetron  (ZOFRAN ) 8 MG tablet Take 1 tablet (8 mg total) by mouth every 8 (eight) hours as needed for nausea or vomiting. 60 tablet 2   oxyCODONE  (OXY IR/ROXICODONE ) 5 MG immediate release tablet Take 1 tablet (5 mg total) by mouth every 4 (four) hours as needed for severe pain (pain score 7-10). 30 tablet 0   pantoprazole  (PROTONIX ) 40 MG tablet Take 1 tablet (40 mg total) by mouth daily. 30 tablet 5   ranolazine  (RANEXA ) 500 MG 12 hr tablet Take 500 mg by mouth 2 (two) times daily.     sitaGLIPtin-metformin (JANUMET) 50-1000 MG tablet Take 1 tablet by mouth daily.     venlafaxine  XR (EFFEXOR -XR) 75 MG 24 hr capsule Take 75 mg by mouth daily.     No current facility-administered medications for this visit.    OBJECTIVE: There were no vitals filed for  this visit.       There is no height or weight on file to calculate BMI.    ECOG FS:3 - Symptomatic, >50% confined to bed  General: Ill-appearing, moderate distress secondary to abdominal pain. Eyes: Pink conjunctiva, anicteric sclera. HEENT: Normocephalic, moist mucous membranes. Lungs: No audible wheezing or coughing. Heart: Regular rate and rhythm. Abdomen: Soft, nontender, no obvious distention. Musculoskeletal: No edema, cyanosis, or clubbing. Neuro: Alert, answering all questions appropriately. Cranial nerves grossly intact. Skin: No rashes or petechiae noted. Psych: Normal affect.   LAB RESULTS:  Lab Results  Component Value Date   NA 134 (L) 06/15/2024   K 4.1 06/15/2024   CL 101 06/15/2024   CO2 24 06/15/2024   GLUCOSE 143 (H) 06/15/2024   BUN 21 06/15/2024   CREATININE 0.82 06/15/2024   CALCIUM  8.8 (L) 06/15/2024   PROT 6.4 (L) 06/15/2024   ALBUMIN 2.9 (L) 06/15/2024   AST 14 (L) 06/15/2024   ALT 13 06/15/2024   ALKPHOS 45 06/15/2024   BILITOT 0.5 06/15/2024    GFRNONAA >60 06/15/2024    Lab Results  Component Value Date   WBC 12.8 (H) 06/15/2024   NEUTROABS 11.2 (H) 06/15/2024   HGB 7.6 (L) 06/15/2024   HCT 25.5 (L) 06/15/2024   MCV 85.3 06/15/2024   PLT 370 06/15/2024     STUDIES: No results found.  ONCOLOGY HISTORY: Patient underwent complete surgical resection at [facility] on May 23, 2022.  Large colon mass was noted to partially invade stomach as well as surrounding mesentery.  PET scan did not reveal any metastatic disease other than a positive lung nodule which was confirmed to be a second primary.  Patient completed her final cycle of adjuvant FOLFOX on December 23, 2022.  PET scan results from May 20, 2023 with clear progression of disease.  Patient was seen at [facility] and determined not to be a surgical candidate.  Patient then received 12 cycles of FOLFIRI between June 09, 2023 and March 08, 2024.   ASSESSMENT: Stage IIIc colon cancer.  PLAN:    Stage IIIc colon cancer: Patient last received chemotherapy with FOLFIRI on December 09, 2023.  Her CEA recently trended up, but now has remained stable at approximately 129.  PET scan results from Apr 22, 2024 reviewed independently with no obvious evidence of progressive disease.  No treatment is needed at this time, but patient expressed understanding that she will likely have to reinitiate treatment in the future.  Initial plan was to repeat PET scan in August 2025, but given patient's worsening abdominal pain and nausea, she was transferred to the emergency room for further evaluation and imaging.   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  Patient completed XRT in May 2023.  PET scan above with no obvious evidence of recurrence.   Anemia: Hemoglobin trended down to 7.6.  Transfer to the ED.  Patient will likely require transfusion. Hypomagnesia: Chronic and unchanged.  Magnesium  level is 1.4.  Patient will  need IV magnesium  replacement. Peripheral neuropathy: Chronic and unchanged.  Patient has had no recent falls.  She was previously given a referral to Occupational Therapy. Coping/anxiety: Patient was previously given a referral to [facility]. Nausea: Patient was previously given a prescription for Reglan  and instructed to take controlled Zofran .  She received 8 mg IV Zofran  in clinic today.  ED as above.  Abdominal pain: Significantly worse.  Patient received  a total of 4 mg IV morphine  in clinic today.  Appreciate palliative care input.  Patient expressed understanding and was in agreement with this plan. She also understands that She can call clinic at any time with any questions, concerns, or complaints.    Cancer Staging  Colon cancer [facility]) Staging form: Colon and Rectum, AJCC 8th Edition - Pathologic stage from 06/10/2022: Stage IIIC (pT4b, pN1c, cM0) - Signed by Jacobo Evalene PARAS, MD on 06/10/2022 Stage prefix: Initial diagnosis Total positive nodes: 0 Histologic grading system: 4 grade system Histologic grade (G): G3   Evalene PARAS Jacobo, MD   06/15/2024 12:50 PM

## 2024-06-15 NOTE — ED Notes (Signed)
 Pt ambulated up to bathroom and back to bed. Call bell within reach.

## 2024-06-15 NOTE — H&P (Incomplete)
 History and Physical    Jessica Stout FMW:[id number] DOB: 12/27/1950 DOA: 06/15/2024  Referring MD/NP/PA:   PCP: Corlis Honor BROCKS, MD   Patient coming from:  The patient is coming from home.     Chief Complaint: Nausea, vomiting, abdominal pain  HPI: Jessica Stout is a 73 y.o. female with medical history significant of colon cancer s/p of chemotherapy, s/p of partial gastrotomy, s/p of right hemicolectomy), HTN, HLD, DM, COPD/asthma, sCHF with EF 45-50%, stroke, depression with anxiety, anemia, who presents with nausea, vomiting, abdominal pain.  Patient states that she has worsening abdominal pain for about 4 weeks, associated with nausea and intermittent vomiting with nonbilious nonbloody vomiting.  Her abdominal pain is located in the central abdomen, constant, aching, moderate to severe, radiating to the back, not aggravated or alleviated by any known factors. She states that she has chronic diarrhea at baseline which has not changed.  No fever or chills.  She has some central chest pain, mild SOB and cough with clear mucus production.  Denies symptoms of UTI. Pt was seen in cancer center clinic today. Pt had received 4 mg MSO4 and 8 mg Zofran  per triage nurse's documentation. Pt has right upper chest Port.  Data reviewed independently and ED Course: pt was found to have WBC 16.0, GFR> 60, troponin 13, lactic acid 0.5, negative PCR for COVID, flu and RSV, magnesium  1.4, potassium 4.5, phosphorus 3.2.  CTA negative for PE.  CT abdomen/pelvis that showed possible intra-abdominal abscess.  Patient is admitted to PCU as inpatient.  Dr. Jacinta of surgery is consulted.  CTA and CT abdomen/pelvis: 1. Negative for pulmonary embolus. 2. Partial gastrectomy with fold thickening, irregularity and an outpouching along the anastomotic margin, worrisome for perforation/abscess and possible disease recurrence. 3. Small bowel resection and extended right hemicolectomy with an extraluminal collection of  fluid and air associated with an enteric anastomosis in the high left anatomic pelvis, suggestive of an anastomotic leak and abscess. Again, disease recurrence may be the underlying etiology. 4. 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 5. Healing or healed pathologic fracture of the posterior left fifth rib, as discussed on PET 04/22/2024. 6. Cholelithiasis. 7. Left adrenal mass, previously characterized as a myelolipoma. 8. Scattered ascites. 9. Aortic atherosclerosis (ICD10-I70.0). Coronary artery calcification. 10. Enlarged pulmonic trunk, indicative of pulmonary arterial hypertension. 11.  Emphysema (ICD10-J43.9).  EKG: I have personally reviewed.  Sinus rhythm, QTc 435, LAE, low voltage.   Review of Systems:   General: no fevers, chills, no body weight gain, has poor appetite, has fatigue HEENT: no blurry vision, hearing changes or sore throat Respiratory: has dyspnea, coughing, no wheezing CV: has chest pain, no palpitations GI: has nausea, vomiting, abdominal pain, chronic diarrhea GU: no dysuria, burning on urination, increased urinary frequency, hematuria  Ext: no leg edema Neuro: no unilateral weakness, numbness, or tingling, no vision change or hearing loss Skin: no rash, no skin tear. MSK: No muscle spasm, no deformity, no limitation of range of movement in spin Heme: No easy bruising.  Travel history: No recent long distant travel.   Allergy:  Allergies  Allergen Reactions   Codeine Rash and Dermatitis    Happened in 1970s    Past Medical History:  Diagnosis Date   Anemia    Anxiety    Asthma    Cancer (HCC)    colon   CHF (congestive heart failure) (HCC)    COPD (chronic obstructive pulmonary disease) (HCC)    Diabetes mellitus without complication (HCC)  Hyperlipidemia    Hypertension    Stroke [facility])     Past Surgical History:  Procedure Laterality Date   BIOPSY  06/26/2023   Procedure: BIOPSY;  Surgeon: Maryruth Ole DASEN, MD;  Location: [facility] ENDOSCOPY;  Service: Endoscopy;;   COLONOSCOPY N/A 06/26/2023   Procedure: COLONOSCOPY;  Surgeon: Maryruth Ole DASEN, MD;  Location: [facility] ENDOSCOPY;  Service: Endoscopy;  Laterality: N/A;   COLONOSCOPY WITH PROPOFOL  N/A 02/03/2022   Procedure: COLONOSCOPY WITH PROPOFOL ;  Surgeon: Unk Corinn Skiff, MD;  Location: [facility] ENDOSCOPY;  Service: Gastroenterology;  Laterality: N/A;   ESOPHAGOGASTRODUODENOSCOPY N/A 06/26/2023   Procedure: ESOPHAGOGASTRODUODENOSCOPY (EGD);  Surgeon: Maryruth Ole DASEN, MD;  Location: [facility] ENDOSCOPY;  Service: Endoscopy;  Laterality: N/A;   ESOPHAGOGASTRODUODENOSCOPY (EGD) WITH PROPOFOL  N/A 02/03/2022   Procedure: ESOPHAGOGASTRODUODENOSCOPY (EGD) WITH PROPOFOL ;  Surgeon: Unk Corinn Skiff, MD;  Location: [facility] ENDOSCOPY;  Service: Gastroenterology;  Laterality: N/A;   GIVENS CAPSULE STUDY N/A 02/03/2022   Procedure: GIVENS CAPSULE STUDY;  Surgeon: Unk Corinn Skiff, MD;  Location: [facility] ENDOSCOPY;  Service: Gastroenterology;  Laterality: N/A;  possible Capsule Study   IR IMAGING GUIDED PORT INSERTION  06/18/2022   POLYPECTOMY  06/26/2023   Procedure: POLYPECTOMY;  Surgeon: Maryruth Ole DASEN, MD;  Location: [facility] ENDOSCOPY;  Service: Endoscopy;;    Social History:  reports that she quit smoking about 18 months ago. Her smoking use included e-cigarettes. She does not have any smokeless tobacco history on file. She reports current alcohol use. She reports current drug use. Drug: Marijuana.  Family History:  Family History  Problem Relation Age of Onset   Diabetes Mother    Heart attack Father    Colon cancer Father      Prior to Admission medications   Medication Sig Start Date End Date Taking? Authorizing Provider  acetaminophen  (TYLENOL ) 325 MG tablet Take 650 mg by mouth every 6 (six) hours as needed.    [provider]  albuterol  (VENTOLIN  HFA) 108 (90 Base) MCG/ACT inhaler Inhale into the lungs. 01/19/22   [provider]   atorvastatin  (LIPITOR) 40 MG tablet Take 40 mg by mouth daily. 12/12/21   [provider]  clonazePAM  (KLONOPIN ) 1 MG tablet Take 1 mg by mouth 2 (two) times daily.    [provider]  fenofibrate  54 MG tablet Take 54 mg by mouth daily. 10/04/22   [provider]  gabapentin  (NEURONTIN ) 600 MG tablet Take by mouth 2 (two) times daily. 01/01/22   [provider]  insulin  lispro (HUMALOG) 100 UNIT/ML injection Inject 1-10 Units into the skin 3 (three) times daily before meals. Per sliding scale Patient not taking: Reported on 08/25/2023    [provider]  LANTUS  SOLOSTAR 100 UNIT/ML Solostar Pen Inject 12 Units into the skin at bedtime. Patient not taking: Reported on 04/29/2024 01/17/22   [provider]  lisinopril (ZESTRIL) 20 MG tablet Take 20 mg by mouth daily.    [provider]  Magnesium  Citrate 200 MG TABS Take 1 tablet by mouth in the morning and at bedtime. 250 twice daily Patient not taking: Reported on 11/18/2023    [provider]  megestrol  (MEGACE ) 40 MG tablet Take 1 tablet (40 mg total) by mouth daily. Patient not taking: Reported on 04/29/2024 09/30/23   Jacobo Evalene PARAS, MD  metoCLOPramide  (REGLAN ) 10 MG tablet Take 1 tablet (10 mg total) by mouth in the morning, at noon, and at bedtime. 05/04/24   Jacobo Evalene PARAS, MD  metoprolol  succinate (TOPROL -XL) 50  MG 24 hr tablet Take 50 mg by mouth daily. Take with or immediately following a meal.    [provider]  OLANZapine  (ZYPREXA ) 10 MG tablet Take 0.5-1 tablets (5-10 mg total) by mouth at bedtime as needed (nausea). 07/09/23   Borders, Fonda SAUNDERS, NP  ondansetron  (ZOFRAN ) 8 MG tablet Take 1 tablet (8 mg total) by mouth every 8 (eight) hours as needed for nausea or vomiting. 12/09/23   Jacobo Evalene PARAS, MD  oxyCODONE  (OXY IR/ROXICODONE ) 5 MG immediate release tablet Take 1 tablet (5 mg total) by mouth every 4 (four) hours as needed for severe pain (pain  score 7-10). 05/31/24   Jacobo Evalene PARAS, MD  pantoprazole  (PROTONIX ) 40 MG tablet Take 1 tablet (40 mg total) by mouth daily. 03/11/22 04/29/24  Donette Ellouise LABOR, FNP  ranolazine  (RANEXA ) 500 MG 12 hr tablet Take 500 mg by mouth 2 (two) times daily.    [provider]  sitaGLIPtin-metformin (JANUMET) 50-1000 MG tablet Take 1 tablet by mouth daily.    [provider]  venlafaxine  XR (EFFEXOR -XR) 75 MG 24 hr capsule Take 75 mg by mouth daily. 12/07/21   [provider]  prochlorperazine  (COMPAZINE ) 10 MG tablet Take 1 tablet (10 mg total) by mouth every 6 (six) hours as needed (Nausea or vomiting). 06/10/22 07/10/22  Jacobo Evalene PARAS, MD    Physical Exam: Vitals:   06/15/24 2230 06/15/24 2300 06/16/24 0000 06/16/24 0030  BP: (!) 124/58 126/62 (!) 130/59 120/64  Pulse: 80 77 84 82  Resp: 12 12 13 13   Temp:      TempSrc:      SpO2: 99% 99% 97% 95%  Weight:      Height:       General: Not in acute distress HEENT:       Eyes: PERRL, EOMI, no jaundice       ENT: No discharge from the ears and nose, no pharynx injection, no tonsillar enlargement.        Neck: No JVD, no bruit, no mass felt. Heme: No neck lymph node enlargement. Cardiac: S1/S2, RRR, No murmurs, No gallops or rubs. Respiratory: No rales, wheezing, rhonchi or rubs. GI: Soft, nondistended, nontender, no rebound pain, no organomegaly, BS present. GU: No hematuria Ext: No pitting leg edema bilaterally. 1+DP/PT pulse bilaterally. Musculoskeletal: No joint deformities, No joint redness or warmth, no limitation of ROM in spin. Skin: No rashes.  Neuro: Alert, oriented X3, cranial nerves II-XII grossly intact, moves all extremities normally. Muscle strength 5/5 in all extremities, sensation to light touch intact. Brachial reflex 2+ bilaterally. Knee reflex 1+ bilaterally. Negative Babinski's sign. Normal finger to nose test. Psych: Patient is not psychotic, no suicidal or hemocidal ideation.  Labs on  Admission: I have personally reviewed following labs and imaging studies  CBC: Recent Labs  Lab 06/15/24 0941 06/15/24 1237  WBC 12.8* 16.0*  NEUTROABS 11.2*  --   HGB 7.6* 8.0*  HCT 25.5* 26.9*  MCV 85.3 85.9  PLT 370 450*   Basic Metabolic Panel: Recent Labs  Lab 06/15/24 0941 06/15/24 1237  NA 134* 138  K 4.1 4.5  CL 101 106  CO2 24 25  GLUCOSE 143* 134*  BUN 21 20  CREATININE 0.82 0.88  CALCIUM  8.8* 9.0  MG 1.4*  --   PHOS  --  3.2   GFR: Estimated Creatinine Clearance: 45.9 mL/min (by C-G formula based on SCr of 0.88 mg/dL). Liver Function Tests: Recent Labs  Lab 06/15/24 0941 06/15/24 1237  AST 14* 15  ALT 13 12  ALKPHOS 45 44  BILITOT 0.5 0.5  PROT 6.4* 6.6  ALBUMIN 2.9* 2.9*   Recent Labs  Lab 06/15/24 1237  LIPASE 29   No results for input(s): AMMONIA in the last 168 hours. Coagulation Profile: Recent Labs  Lab 06/15/24 2041  INR 1.0   Cardiac Enzymes: No results for input(s): CKTOTAL, CKMB, CKMBINDEX, TROPONINI in the last 168 hours. BNP (last 3 results) No results for input(s): PROBNP in the last 8760 hours. HbA1C: No results for input(s): HGBA1C in the last 72 hours. CBG: Recent Labs  Lab 06/15/24 2149  GLUCAP 116*   Lipid Profile: No results for input(s): CHOL, HDL, LDLCALC, TRIG, CHOLHDL, LDLDIRECT in the last 72 hours. Thyroid Function Tests: No results for input(s): TSH, T4TOTAL, FREET4, T3FREE, THYROIDAB in the last 72 hours. Anemia Panel: No results for input(s): VITAMINB12, FOLATE, FERRITIN, TIBC, IRON , RETICCTPCT in the last 72 hours. Urine analysis:    Component Value Date/Time   COLORURINE YELLOW (A) 06/15/2024 1515   APPEARANCEUR CLEAR (A) 06/15/2024 1515   LABSPEC 1.026 06/15/2024 1515   PHURINE 5.0 06/15/2024 1515   GLUCOSEU NEGATIVE 06/15/2024 1515   HGBUR NEGATIVE 06/15/2024 1515   BILIRUBINUR NEGATIVE 06/15/2024 1515   KETONESUR NEGATIVE 06/15/2024 1515    PROTEINUR NEGATIVE 06/15/2024 1515   NITRITE NEGATIVE 06/15/2024 1515   LEUKOCYTESUR NEGATIVE 06/15/2024 1515   Sepsis Labs: @LABRCNTIP (procalcitonin:4,lacticidven:4) ) Recent Results (from the past 240 hours)  Resp panel by RT-PCR (RSV, Flu A&B, Covid) Anterior Nasal Swab     Status: None   Collection Time: 06/15/24  1:21 PM   Specimen: Anterior Nasal Swab  Result Value Ref Range Status   SARS Coronavirus 2 by RT PCR NEGATIVE NEGATIVE Final    Comment: (NOTE) SARS-CoV-2 target nucleic acids are NOT DETECTED.  The SARS-CoV-2 RNA is generally detectable in upper respiratory specimens during the acute phase of infection. The lowest concentration of SARS-CoV-2 viral copies this assay can detect is 138 copies/mL. A negative result does not preclude SARS-Cov-2 infection and should not be used as the sole basis for treatment or other patient management decisions. A negative result may occur with  improper specimen collection/handling, submission of specimen other than nasopharyngeal swab, presence of viral mutation(s) within the areas targeted by this assay, and inadequate number of viral copies(<138 copies/mL). A negative result must be combined with clinical observations, patient history, and epidemiological information. The expected result is Negative.  Fact Sheet for Patients:  BloggerCourse.com  Fact Sheet for Healthcare Providers:  SeriousBroker.it  This test is no t yet approved or cleared by the [place]  FDA and  has been authorized for detection and/or diagnosis of SARS-CoV-2 by FDA under an Emergency Use Authorization (EUA). This EUA will remain  in effect (meaning this test can be used) for the duration of the COVID-19 declaration under Section 564(b)(1) of the Act, 21 U.S.C.section 360bbb-3(b)(1), unless the authorization is terminated  or revoked sooner.       Influenza A by PCR NEGATIVE NEGATIVE Final    Influenza B by PCR NEGATIVE NEGATIVE Final    Comment: (NOTE) The Xpert Xpress SARS-CoV-2/FLU/RSV plus assay is intended as an aid in the diagnosis of influenza from Nasopharyngeal swab specimens and should not be used as a sole basis for treatment. Nasal washings and aspirates are unacceptable for Xpert Xpress SARS-CoV-2/FLU/RSV testing.  Fact Sheet for Patients: BloggerCourse.com  Fact Sheet for Healthcare Providers: SeriousBroker.it  This test is not yet approved or  cleared by the [place]  FDA and has been authorized for detection and/or diagnosis of SARS-CoV-2 by FDA under an Emergency Use Authorization (EUA). This EUA will remain in effect (meaning this test can be used) for the duration of the COVID-19 declaration under Section 564(b)(1) of the Act, 21 U.S.C. section 360bbb-3(b)(1), unless the authorization is terminated or revoked.     Resp Syncytial Virus by PCR NEGATIVE NEGATIVE Final    Comment: (NOTE) Fact Sheet for Patients: BloggerCourse.com  Fact Sheet for Healthcare Providers: SeriousBroker.it  This test is not yet approved or cleared by the [place]  FDA and has been authorized for detection and/or diagnosis of SARS-CoV-2 by FDA under an Emergency Use Authorization (EUA). This EUA will remain in effect (meaning this test can be used) for the duration of the COVID-19 declaration under Section 564(b)(1) of the Act, 21 U.S.C. section 360bbb-3(b)(1), unless the authorization is terminated or revoked.  Performed at [facility], [address], [place], [place] [address]      Radiological Exams on Admission:   Assessment/Plan Principal Problem:   Abdominal pain Active Problems:   Intra-abdominal abscess (HCC)   Colon cancer (HCC)   Chronic systolic CHF (congestive heart failure) (HCC)   Hyperlipidemia   HTN (hypertension)   Chronic  obstructive pulmonary disease (HCC)   Diabetes mellitus without complication (HCC)   Iron  deficiency anemia   Chest pain   Anxiety and depression   Hypomagnesemia   Assessment and Plan:  Abdominal pain due to intra-abdominal abscess [facility]): CT scan showed possible intra-abdominal abscess.  Presents with WBC 16.0, no fever.  Lactic acid normal 0.5.  Clinically does not seem to have sepsis.  Consulted Dr. Lane of surgery - Admitted to PCU as inpatient - IV Zosyn  - Follow-up blood culture - IV fluid: 1 L LR, 2 L normal saline, then 50 cc/h of normal saline - As needed Dilaudid , Percocet, Tylenol  for pain - As needed Zofran  for nausea vomiting  Colon cancer [facility]): Patient is currently not on chemotherapy. -Following up with Dr. Jacobo of oncology  Chronic systolic CHF (congestive heart failure) (HCC): 2D echo on 12/25/2022 showed EF of 45 to 50% with grade 1 diastolic dysfunction.  Patient does not have leg edema or JVD.  CHF is compensated. -Watch volume status closely  Hyperlipidemia -Lipitor and fenofibrate   HTN (hypertension): -IV hydralazine  as needed -Hold lisinopril since patient at risk of developing hypotension and sepsis. - Continue metoprolol   Chronic obstructive pulmonary disease (HCC): Stable -Bronchodilators and as needed Mucinex   Diabetes mellitus without complication (HCC): Recent A1c 7.0.  Patient is taking Janumet -SSI  Iron  deficiency anemia: Hemoglobin stable 8.0 (7.6 on 06/15/2024) -Follow-up with CBC  Chest pain: CTA negative for PE.  Troponin 13. -Trend troponin - Continue home Ranexa  - Pain control as above  Anxiety and depression - Continue home medications  Hypomagnesemia: Magnesium  1.4.  Phosphorus is 3.2, potassium 4.5.  Per report, patient received 4 g magnesium  sulfate in cancer center. - Will repeat magnesium  level     DVT ppx: SQ Heparin     Code Status: Full code per pt  Family Communication:     not done, no family member is  at bed side.    Disposition Plan:  Anticipate discharge back to previous environment  Consults called: [place]  Admission status and Level of care: Progressive:  as patient    Dispo: The patient is from: Home              Anticipated  d/c is to: Home              Anticipated d/c date is: 2 days              Patient currently is not medically stable to d/c.    Severity of Illness:  The appropriate patient status for this patient is INPATIENT. Inpatient status is judged to be reasonable and necessary in order to provide the required intensity of service to ensure the patient's safety. The patient's presenting symptoms, physical exam findings, and initial radiographic and laboratory data in the context of their chronic comorbidities is felt to place them at high risk for further clinical deterioration. Furthermore, it is not anticipated that the patient will be medically stable for discharge from the hospital within 2 midnights of admission.   * I certify that at the point of admission it is my clinical judgment that the patient will require inpatient hospital care spanning beyond 2 midnights from the point of admission due to high intensity of service, high risk for further deterioration and high frequency of surveillance required.*       Date of Service 06/16/2024    Caleb Exon [facility]   If 7PM-7AM, please contact night-coverage [email or web address] 06/16/2024, 1:29 AM

## 2024-06-15 NOTE — ED Triage Notes (Signed)
 [facility].  Has a stage 4 colon cancer. Presented today at cancer center.  Right chest port access, 4 mg MSO4 and 8 mg Zofran .  Patient c/o intractable abdominal pain and vomiting.

## 2024-06-15 NOTE — ED Notes (Signed)
 CCMD called for cardiac monitoring.

## 2024-06-15 NOTE — Progress Notes (Signed)
 Palliative Medicine [facility] at [facility] Telephone:([phone]) [phone] [phone]) [phone]   Name: Jessica Stout Date: 06/15/2024 MRN: [id number]  DOB: 1950-12-03  Patient Care Team: Corlis Honor BROCKS, MD as PCP - General (Internal Medicine) Darliss Rogue, MD as PCP - Cardiology (Cardiology) Maurie Rayfield BIRCH, RN as Oncology Nurse Navigator Jacobo, Evalene PARAS, MD as Consulting Physician (Oncology) Lenn Aran, MD as Consulting Physician (Radiation Oncology)    REASON FOR CONSULTATION: Jessica Stout is a 73 y.o. 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  Palliative care was consulted to address goals.  SOCIAL HISTORY:     reports that she quit smoking about 18 months ago. Her smoking use included e-cigarettes. She does not have any smokeless tobacco history on file. She reports current alcohol use. She reports current drug use. Drug: Marijuana.  Patient is married and lives at home with her husband and two cats.  She has a son who lives nearby and is involved.  Patient previously worked as a Neurosurgeon.  ADVANCE DIRECTIVES:  None on file  CODE STATUS:   PAST MEDICAL HISTORY: Past Medical History:  Diagnosis Date   Anemia    Anxiety    Asthma    Cancer (HCC)    colon   CHF (congestive heart failure) (HCC)    COPD (chronic obstructive pulmonary disease) (HCC)    Diabetes mellitus without complication (HCC)    Hyperlipidemia    Hypertension    Stroke [facility] [place])     PAST SURGICAL HISTORY:  Past Surgical History:  Procedure Laterality Date   BIOPSY  06/26/2023   Procedure: BIOPSY;  Surgeon: Maryruth Ole DASEN, MD;  Location: [facility]  ENDOSCOPY;  Service: Endoscopy;;   COLONOSCOPY N/A 06/26/2023   Procedure: COLONOSCOPY;  Surgeon: Maryruth Ole DASEN, MD;  Location: [facility] ENDOSCOPY;  Service: Endoscopy;  Laterality: N/A;   COLONOSCOPY WITH PROPOFOL  N/A 02/03/2022   Procedure: COLONOSCOPY WITH PROPOFOL ;  Surgeon: Unk Corinn Skiff, MD;  Location: [facility] ENDOSCOPY;  Service: Gastroenterology;  Laterality: N/A;   ESOPHAGOGASTRODUODENOSCOPY N/A 06/26/2023   Procedure: ESOPHAGOGASTRODUODENOSCOPY (EGD);  Surgeon: Maryruth Ole DASEN, MD;  Location: [facility] ENDOSCOPY;  Service: Endoscopy;  Laterality: N/A;   ESOPHAGOGASTRODUODENOSCOPY (EGD) WITH PROPOFOL  N/A 02/03/2022   Procedure: ESOPHAGOGASTRODUODENOSCOPY (EGD) WITH PROPOFOL ;  Surgeon: Unk Corinn Skiff, MD;  Location: [facility] ENDOSCOPY;  Service: Gastroenterology;  Laterality: N/A;   GIVENS CAPSULE STUDY N/A 02/03/2022   Procedure: GIVENS CAPSULE STUDY;  Surgeon: Unk Corinn Skiff, MD;  Location: [facility] ENDOSCOPY;  Service: Gastroenterology;  Laterality: N/A;  possible Capsule Study   IR IMAGING GUIDED PORT INSERTION  06/18/2022   POLYPECTOMY  06/26/2023   Procedure: POLYPECTOMY;  Surgeon: Maryruth Ole DASEN, MD;  Location: [facility] ENDOSCOPY;  Service: Endoscopy;;    HEMATOLOGY/ONCOLOGY HISTORY:  Oncology History  Colon cancer (HCC)  02/04/2022 Initial Diagnosis   Colon cancer (HCC)   06/10/2022 Cancer Staging   Staging form: Colon and Rectum, AJCC 8th Edition - Pathologic stage from 06/10/2022: Stage IIIC (pT4b, pN1c, cM0) - Signed by Jacobo Evalene PARAS, MD on 06/10/2022 Stage prefix: Initial diagnosis Total positive nodes: 0 Histologic grading system: 4 grade system Histologic grade (G): G3   06/24/2022 - 07/08/2022 Chemotherapy   Patient is on Treatment Plan : COLORECTAL FOLFOX  q14d x 6 months     06/24/2022 - 12/25/2022 Chemotherapy   Patient is on Treatment Plan : COLORECTAL FOLFOX q14d x 6 months     06/09/2023 - 07/23/2023 Chemotherapy   Patient is on Treatment Plan : COLORECTAL  FOLFIRI q14d     08/04/2023 -  Chemotherapy   Patient is on Treatment Plan : COLORECTAL FOLFIRI + Bevacizumab  q14d       ALLERGIES:  is allergic to codeine.  MEDICATIONS:  Current Outpatient Medications  Medication Sig Dispense Refill   acetaminophen  (TYLENOL ) 325 MG tablet Take 650 mg by mouth every 6 (six) hours as needed.     albuterol  (VENTOLIN  HFA) 108 (90 Base) MCG/ACT inhaler Inhale into the lungs.     atorvastatin  (LIPITOR) 40 MG tablet Take 40 mg by mouth daily.     clonazePAM  (KLONOPIN ) 1 MG tablet Take 1 mg by mouth 2 (two) times daily.     fenofibrate  54 MG tablet Take 54 mg by mouth daily.     gabapentin  (NEURONTIN ) 600 MG tablet Take by mouth 2 (two) times daily.     insulin  lispro (HUMALOG) 100 UNIT/ML injection Inject 1-10 Units into the skin 3 (three) times daily before meals. Per sliding scale (Patient not taking: Reported on 08/25/2023)     LANTUS  SOLOSTAR 100 UNIT/ML Solostar Pen Inject 12 Units into the skin at bedtime. (Patient not taking: Reported on 04/29/2024)     lisinopril (ZESTRIL) 20 MG tablet Take 20 mg by mouth daily.     Magnesium  Citrate 200 MG TABS Take 1 tablet by mouth in the morning and at bedtime. 250 twice daily (Patient not taking: Reported on 11/18/2023)     megestrol  (MEGACE ) 40 MG tablet Take 1 tablet (40 mg total) by mouth daily. (Patient not taking: Reported on 04/29/2024) 30 tablet 0   metoCLOPramide  (REGLAN ) 10 MG tablet Take 1 tablet (10 mg total) by mouth in the morning, at noon, and at bedtime. 90 tablet 1   metoprolol  succinate (TOPROL -XL) 50 MG 24 hr tablet Take 50 mg by mouth daily. Take with or immediately following a meal.     OLANZapine  (ZYPREXA ) 10 MG tablet Take 0.5-1 tablets (5-10 mg total) by mouth at bedtime as needed (nausea). 30 tablet 1   ondansetron  (ZOFRAN ) 8 MG tablet Take 1 tablet (8 mg total) by mouth every 8 (eight) hours as needed for nausea or vomiting. 60 tablet 2   oxyCODONE  (OXY IR/ROXICODONE ) 5 MG immediate release  tablet Take 1 tablet (5 mg total) by mouth every 4 (four) hours as needed for severe pain (pain score 7-10). 30 tablet 0   pantoprazole  (PROTONIX ) 40 MG tablet Take 1 tablet (40 mg total) by mouth daily. 30 tablet 5   ranolazine  (RANEXA ) 500 MG 12 hr tablet Take 500 mg by mouth 2 (two) times daily.     sitaGLIPtin-metformin (JANUMET) 50-1000 MG tablet Take 1 tablet by mouth daily.     venlafaxine  XR (EFFEXOR -XR) 75 MG 24 hr capsule Take 75 mg by mouth daily.     No current facility-administered medications for this visit.    VITAL SIGNS: There were no vitals taken for this visit. There were no vitals filed for this visit.   Estimated body mass index is 19.57 kg/m as calculated from the following:   Height as of 04/22/24: 5' 4 (1.626 m).   Weight as of 04/29/24: 114 lb (51.7 kg).  LABS: CBC:    Component Value Date/Time   WBC 12.8 (H) 06/15/2024  0941   HGB 7.6 (L) 06/15/2024 0941   HGB 8.9 (L) 04/04/2024 1416   HCT 25.5 (L) 06/15/2024 0941   PLT 370 06/15/2024 0941   PLT 275 04/04/2024 1416   MCV 85.3 06/15/2024 0941   NEUTROABS 11.2 (H) 06/15/2024 0941   LYMPHSABS 0.8 06/15/2024 0941   MONOABS 0.6 06/15/2024 0941   EOSABS 0.0 06/15/2024 0941   BASOSABS 0.1 06/15/2024 0941   Comprehensive Metabolic Panel:    Component Value Date/Time   NA 134 (L) 06/15/2024 0941   K 4.1 06/15/2024 0941   CL 101 06/15/2024 0941   CO2 24 06/15/2024 0941   BUN 21 06/15/2024 0941   CREATININE 0.82 06/15/2024 0941   GLUCOSE 143 (H) 06/15/2024 0941   CALCIUM  8.8 (L) 06/15/2024 0941   AST 14 (L) 06/15/2024 0941   ALT 13 06/15/2024 0941   ALKPHOS 45 06/15/2024 0941   BILITOT 0.5 06/15/2024 0941   PROT 6.4 (L) 06/15/2024 0941   ALBUMIN 2.9 (L) 06/15/2024 0941    RADIOGRAPHIC STUDIES: No results found.  PERFORMANCE STATUS (ECOG) : 1 - Symptomatic but completely ambulatory  Review of Systems Unless otherwise noted, a complete review of systems is negative.  Physical Exam General:  NAD Card: RRR Pulmonary: CTA ABD: soft, tender left side Extremities: no edema, no joint deformities Skin: no rashes Neurological: Grossly nonfocal  IMPRESSION: Patient was an add-on to my clinic schedule today at Dr. Jerone request.  Patient presented to clinic with severe abdominal and back pain.  Per patient, pain has been worsening over the past several weeks.  Patient has been taking oxycodone  irregularly.  When she takes it, there is some improvement in pain, although effects are short-lived.  Overall, patient endorses poor pain control.  Currently rates pain as 10 out of 10.  We did give her a dose of morphine  2 mg IV in clinic, with quick improvement in pain.  However, pain worsened again after 20 to 30 minutes.  Case and plan discussed with patient and MD.  Decision was made to transfer patient to emergency department for further evaluation and management.  Will repeat dose of morphine  prior to transfer.  Patient also endorses nausea without vomiting.  Denies constipation.LBM earlier today.   Of note, PET scan on 04/22/2024 was without definitive evidence for metastatic disease in the chest abdomen and pelvis.  There was similar FDG avid accumulation of small bowel residual colon.  There had been some uptrend in CEA.  Repeat CEA is pending today.  Given symptomatic changes, concern would be for possible disease progression.  PLAN: - Continue current scope of treatment - Morphine  2 mg IV x 2 - Ondansetron  8 mg IV x 1 - Patient will need imaging in the emergency department - May benefit from long-acting opioid - Will follow  Case and plan discussed with Dr. Jacobo  Patient expressed understanding and was in agreement with this plan. She also understands that She can call the clinic at any time with any questions, concerns, or complaints.     Time Total: 20 minutes  Visit consisted of counseling and education dealing with the complex and emotionally intense issues of  symptom management and palliative care in the setting of serious and potentially life-threatening illness.Greater than 50%  of this time was spent counseling and coordinating care related to the above assessment and plan.  Signed by: Fonda Mower, PhD, NP-C

## 2024-06-15 NOTE — ED Provider Notes (Signed)
 Jessica Stout Provider Note    Event Date/Time   First MD Initiated Contact with Patient 06/15/24 1250     (approximate)   History   Emesis and Abdominal Pain   HPI  Jessica Stout is a 73 y.o. female with history of diabetes, hypertension, hyperlipidemia, CHF with EF of 45% history of recurrent colon cancer metastatic to stomach status post resection, lung cancer here for abdominal pain and nausea vomiting.  Also reports some chest pain and shortness of breath as well as a cough.  She denies any urinary symptoms.  Per independent straight from husband, she does have chronic diarrhea, last diarrhea was at 3 AM.  States not passing gas today.  States abdominal pain is all over.  No recent trauma or falls.  Independent history from husband as above.   Per independent review she was seen by oncology today, presented with abdominal pain and back pain has been worsening last several weeks, has been taking oxycodone  with some improvement of pain but it will recur once the medication wears off.  Was given 2 mg of morphine  in the clinic and sent to the emergency department for further management.   Physical Exam   Triage Vital Signs: ED Triage Vitals [06/15/24 1235]  Encounter Vitals Group     BP 136/87     Girls Systolic BP Percentile      Girls Diastolic BP Percentile      Boys Systolic BP Percentile      Boys Diastolic BP Percentile      Pulse Rate (!) 121     Resp 17     Temp 97.8 F (36.6 C)     Temp Source Oral     SpO2 97 %     Weight      Height      Head Circumference      Peak Flow      Pain Score 10     Pain Loc      Pain Education      Exclude from Growth Chart     Most recent vital signs: Vitals:   06/15/24 1330 06/15/24 1500  BP: 128/75 (!) 147/76  Pulse: 91   Resp: 13 12  Temp:    SpO2: 95%      General: Awake, no distress.  CV:  Good peripheral perfusion.  Resp:  Normal effort.  No tachypnea Abd:  No distention.  Soft,  diffusely tender without guarding Other:  No lower extremity edema, dry mucous membranes   ED Results / Procedures / Treatments   Labs (all labs ordered are listed, but only abnormal results are displayed) Labs Reviewed  COMPREHENSIVE METABOLIC PANEL WITH GFR - Abnormal; Notable for the following components:      Result Value   Glucose, Bld 134 (*)    Albumin 2.9 (*)    All other components within normal limits  CBC - Abnormal; Notable for the following components:   WBC 16.0 (*)    RBC 3.13 (*)    Hemoglobin 8.0 (*)    HCT 26.9 (*)    MCH 25.6 (*)    MCHC 29.7 (*)    RDW 16.5 (*)    Platelets 450 (*)    All other components within normal limits  BRAIN NATRIURETIC PEPTIDE - Abnormal; Notable for the following components:   B Natriuretic Peptide 341.2 (*)    All other components within normal limits  RESP PANEL BY RT-PCR (RSV, FLU A&B, COVID)  RVPGX2  LIPASE, BLOOD  LACTIC ACID, PLASMA  URINALYSIS, ROUTINE W REFLEX MICROSCOPIC  TROPONIN I (HIGH SENSITIVITY)     EKG  EKG shows, sinus tachycardia, rate 117, normal QRS, normal QTc, no obvious ischemic ST elevation, T wave flattening in 1, not significantly changed compared to prior    PROCEDURES:  Critical Care performed: No  Procedures   MEDICATIONS ORDERED IN ED: Medications  HYDROmorphone  (DILAUDID ) injection 0.5 mg (0.5 mg Intravenous Given 06/15/24 1311)  prochlorperazine  (COMPAZINE ) injection 10 mg (10 mg Intravenous Given 06/15/24 1312)  sodium chloride  0.9 % bolus 1,000 mL (0 mLs Intravenous Stopped 06/15/24 1516)  iohexol  (OMNIPAQUE ) 350 MG/ML injection 75 mL (75 mLs Intravenous Contrast Given 06/15/24 1449)     IMPRESSION / MDM / ASSESSMENT AND PLAN / ED COURSE  I reviewed the triage vital signs and the nursing notes.                              Differential diagnosis includes, but is not limited to, mass, worsening metastatic disease, SBO, partial SBO, colitis, diverticulitis, viral illness,  gastroenteritis, considered angina, ACS, PE, pneumonia.  Will get labs, EKG, troponin, CT PE study, CT abdomen pelvis, will give her some IV fluids, IV Dilaudid , IV Compazine  since she has already been given 8 mg of Zofran .  Patient's presentation is most consistent with acute presentation with potential threat to life or bodily function.  Independent interpretation of labs and imaging below.  Patient signed out pending UA and CT imaging results.  If pain controlled and workup is negative, able to discharge home, if not she might to come in for pain control  The patient is on the cardiac monitor to evaluate for evidence of arrhythmia and/or significant heart rate changes.   Clinical Course as of 06/15/24 1525  Wed Jun 15, 2024  1423 Independent review of labs, no leukocytosis, H&H is stable, electrolytes not severely deranged, LFTs are normal, creatinine is normal, BNP is mildly elevated, lactate is normal, troponin is normal, respiratory viral panel is negative. [TT]    Clinical Course User Index [TT] Waymond Lorelle Cummins, MD     FINAL CLINICAL IMPRESSION(S) / ED DIAGNOSES   Final diagnoses:  Nausea vomiting and diarrhea  Generalized abdominal pain  Chest pain, unspecified type  Shortness of breath     Rx / DC Orders   ED Discharge Orders     None        Note:  This document was prepared using Dragon voice recognition software and may include unintentional dictation errors.    Waymond Lorelle Cummins, MD 06/15/24 [phone]

## 2024-06-15 NOTE — ED Provider Notes (Signed)
 5:49 PM Assumed care for off going team.   Blood pressure (!) 153/79, pulse (!) 110, temperature 97.6 F (36.4 C), temperature source Oral, resp. rate (!) 7, height 5' 4 (1.626 m), weight 52.6 kg, SpO2 94%.  See their HPI for full report but in brief   CT pending-- Cancer pt with chest pain, sob, diarrhea, abdominal pain--   1. Negative for pulmonary embolus.  2. Partial gastrectomy with fold thickening, irregularity and an  outpouching along the anastomotic margin, worrisome for  perforation/abscess and possible disease recurrence.  3. Small bowel resection and extended right hemicolectomy with an  extraluminal collection of fluid and air associated with an enteric  anastomosis in the high left anatomic pelvis, suggestive of an  anastomotic leak and abscess. Again, disease recurrence may be the  underlying etiology.  4. 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  5. Healing or healed pathologic fracture of the posterior left fifth  rib, as discussed on PET 04/22/2024.  6. Cholelithiasis.  7. Left adrenal mass, previously characterized as a myelolipoma.  8. Scattered ascites.  9. Aortic atherosclerosis (ICD10-I70.0). Coronary artery  calcification.  10. Enlarged pulmonic trunk, indicative of pulmonary arterial  hypertension.  11.  Emphysema (ICD10-J43.9).   Pt with multiple possible perforation. Zosyn  ordered- Surgery Dr lane contacted.   She reports having pain in 4 weeks now.  He does not want to jump into doing surgery as these look kind of old in nature.  Agrees with IV antibiotics and admission here will consult on.  Patient meets sepsis criteria so blood cultures, lactate ordered Zosyn  started.  Full fluid resuscitation was ordered at 30 cc/kg given patient's continued elevated heart rate.  I will discuss the hospital team for admission  .Critical Care  Performed by: Ernest Ronal BRAVO, MD Authorized by: Ernest Ronal BRAVO, MD   Critical care provider  statement:    Critical care time (minutes):  30   Critical care was necessary to treat or prevent imminent or life-threatening deterioration of the following conditions:  Sepsis   Critical care was time spent personally by me on the following activities:  Development of treatment plan with patient or surrogate, discussions with consultants, evaluation of patient's response to treatment, examination of patient, ordering and review of laboratory studies, ordering and review of radiographic studies, ordering and performing treatments and interventions, pulse oximetry, re-evaluation of patient's condition and review of old charts           Ernest Ronal BRAVO, MD 06/15/24 1806

## 2024-06-15 NOTE — Progress Notes (Signed)
 CODE SEPSIS - PHARMACY COMMUNICATION  **Broad Spectrum Antibiotics should be administered within 1 hour of Sepsis diagnosis**  Time Code Sepsis Called/Page Received: 1757  Antibiotics Ordered: zosyn   Time of 1st antibiotic administration: 1826  Additional action taken by pharmacy:    If necessary, Name of Provider/Nurse Contacted:      Suzann Allean LABOR ,PharmD Clinical Pharmacist  06/15/2024  6:30 PM

## 2024-06-15 NOTE — ED Notes (Signed)
 Patient up to toilet without incident and back to bed. Monitor applied.

## 2024-06-15 NOTE — Sepsis Progress Note (Signed)
 Elink following code sepsis

## 2024-06-15 NOTE — ED Triage Notes (Signed)
 Pt to ED via POV from cancer center. Pt has stage 4 colon cancer. Pt reports generalized abd pain, back pain, N/V that has been present since Mother's day but has gotten worse. Pt has port accessed. Pt is on chemo.

## 2024-06-16 DIAGNOSIS — R1084 Generalized abdominal pain: Secondary | ICD-10-CM | POA: Diagnosis not present

## 2024-06-16 DIAGNOSIS — R935 Abnormal findings on diagnostic imaging of other abdominal regions, including retroperitoneum: Secondary | ICD-10-CM

## 2024-06-16 DIAGNOSIS — R079 Chest pain, unspecified: Secondary | ICD-10-CM | POA: Diagnosis present

## 2024-06-16 DIAGNOSIS — K651 Peritoneal abscess: Secondary | ICD-10-CM | POA: Diagnosis not present

## 2024-06-16 DIAGNOSIS — R112 Nausea with vomiting, unspecified: Secondary | ICD-10-CM | POA: Diagnosis not present

## 2024-06-16 DIAGNOSIS — R197 Diarrhea, unspecified: Secondary | ICD-10-CM | POA: Diagnosis not present

## 2024-06-16 LAB — BASIC METABOLIC PANEL WITH GFR
Anion gap: 7 (ref 5–15)
BUN: 13 mg/dL (ref 8–23)
CO2: 23 mmol/L (ref 22–32)
Calcium: 8.2 mg/dL — ABNORMAL LOW (ref 8.9–10.3)
Chloride: 108 mmol/L (ref 98–111)
Creatinine, Ser: 0.54 mg/dL (ref 0.44–1.00)
GFR, Estimated: >60 mL/min (ref >60)
Glucose, Bld: 97 mg/dL (ref 70–99)
Potassium: 4.2 mmol/L (ref 3.5–5.1)
Sodium: 138 mmol/L (ref 135–145)

## 2024-06-16 LAB — CBC
HCT: 23.8 % — ABNORMAL LOW (ref 36.0–46.0)
Hemoglobin: 7 g/dL — ABNORMAL LOW (ref 12.0–15.0)
MCH: 25.3 pg — ABNORMAL LOW (ref 26.0–34.0)
MCHC: 29.4 g/dL — ABNORMAL LOW (ref 30.0–36.0)
MCV: 85.9 fL (ref 80.0–100.0)
Platelets: 339 K/uL (ref 150–400)
RBC: 2.77 MIL/uL — ABNORMAL LOW (ref 3.87–5.11)
RDW: 16.7 % — ABNORMAL HIGH (ref 11.5–15.5)
WBC: 11 K/uL — ABNORMAL HIGH (ref 4.0–10.5)
nRBC: 0 % (ref 0.0–0.2)

## 2024-06-16 LAB — CEA: CEA: 138 ng/mL — ABNORMAL HIGH (ref 0.0–4.7)

## 2024-06-16 LAB — TROPONIN I (HIGH SENSITIVITY)
Troponin I (High Sensitivity): 15 ng/L (ref <18)
Troponin I (High Sensitivity): 12 ng/L (ref <18)
Troponin I (High Sensitivity): 11 ng/L (ref <18)

## 2024-06-16 LAB — GLUCOSE, CAPILLARY
Glucose-Capillary: 82 mg/dL (ref 70–99)
Glucose-Capillary: 79 mg/dL (ref 70–99)

## 2024-06-16 LAB — PREPARE RBC (CROSSMATCH)

## 2024-06-16 LAB — CBG MONITORING, ED
Glucose-Capillary: 109 mg/dL — ABNORMAL HIGH (ref 70–99)
Glucose-Capillary: 99 mg/dL (ref 70–99)

## 2024-06-16 LAB — MAGNESIUM: Magnesium: 1.2 mg/dL — ABNORMAL LOW (ref 1.7–2.4)

## 2024-06-16 LAB — HEMOGLOBIN A1C
Hgb A1c MFr Bld: 5.4 % (ref 4.8–5.6)
Mean Plasma Glucose: 108.28 mg/dL

## 2024-06-16 MED ORDER — LOPERAMIDE HCL 2 MG PO CAPS: 2.0000 mg | ORAL_CAPSULE | Freq: Two times a day (BID) | ORAL | Status: DC | PRN

## 2024-06-16 MED ORDER — RANOLAZINE ER 500 MG PO TB12
500.0000 mg | ORAL_TABLET | Freq: Two times a day (BID) | ORAL | Status: DC
  Administered 2024-06-16 – 2024-06-18 (×5): 500 mg via ORAL
  Filled 2024-06-16 (×5): qty 1

## 2024-06-16 MED ORDER — MAGNESIUM SULFATE 4 GM/100ML IV SOLN
4.0000 g | Freq: Once | INTRAVENOUS | Status: AC
  Administered 2024-06-16: 4 g via INTRAVENOUS
  Filled 2024-06-16: qty 100

## 2024-06-16 MED ORDER — SODIUM CHLORIDE 0.9% IV SOLUTION
Freq: Once | INTRAVENOUS | Status: AC
  Filled 2024-06-16: qty 250

## 2024-06-16 MED ORDER — DEXTROSE-SODIUM CHLORIDE 5-0.9 % IV SOLN: INTRAVENOUS | Status: DC

## 2024-06-16 MED ORDER — RANOLAZINE ER 500 MG PO TB12
500.0000 mg | ORAL_TABLET | Freq: Two times a day (BID) | ORAL | Status: DC
  Filled 2024-06-16: qty 1

## 2024-06-16 NOTE — Progress Notes (Signed)
 Progress Note    Jessica Stout  FMW:[id number] DOB: 1951/05/05  DOA: 06/15/2024 PCP: Corlis Honor BROCKS, MD      Brief Narrative:    Medical records reviewed and are as summarized below:  Jessica Stout is a 73 y.o. female with medical history significant of colon cancer s/p of chemotherapy, s/p of partial gastrotomy, s/p of right hemicolectomy), HTN, HLD, DM, COPD/asthma, CHF with midrange EF 45-50%, stroke, depression with anxiety, anemia, who presented to the hospital with nausea, vomiting and abdominal pain.  She said that she has worsening abdominal pain for about 4 weeks, associated with nausea and intermittent vomiting with nonbilious nonbloody vomiting.  She has chronic diarrhea at baseline which has not changed much.  She had some central chest pain, mild shortness of breath and cough with clear mucus production.  She was seen at the cancer clinic the day of admission and was given 4 g of morphine  sulfate and 8 mg of Zofran .  Significant labs: WBC 12.8, hemoglobin 7.6, magnesium  1.4, albumin 2.9.  CT abdomen and pelvis  1. Negative for pulmonary embolus. 2. Partial gastrectomy with fold thickening, irregularity and an outpouching along the anastomotic margin, worrisome for perforation/abscess and possible disease recurrence. 3. Small bowel resection and extended right hemicolectomy with an extraluminal collection of fluid and air associated with an enteric anastomosis in the high left anatomic pelvis, suggestive of an anastomotic leak and abscess. Again, disease recurrence may be the underlying etiology. 4. 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 5. Healing or healed pathologic fracture of the posterior left fifth rib, as discussed on PET 04/22/2024. 6. Cholelithiasis. 7. Left adrenal mass, previously characterized as a myelolipoma. 8. Scattered ascites. 9. Aortic atherosclerosis (ICD10-I70.0). Coronary artery calcification. 10. Enlarged pulmonic trunk,  indicative of pulmonary arterial hypertension. 11.  Emphysema (ICD10-J43.9).        Assessment/Plan:   Principal Problem:   Abdominal pain Active Problems:   Intra-abdominal abscess (HCC)   Colon cancer (HCC)   Chronic systolic CHF (congestive heart failure) (HCC)   Hyperlipidemia   HTN (hypertension)   Chronic obstructive pulmonary disease (HCC)   Diabetes mellitus without complication (HCC)   Iron  deficiency anemia   Chest pain   Anxiety and depression   Hypomagnesemia    Body mass index is 19.91 kg/m.   Abdominal pain due to intra-abdominal abscess [facility]):  Continue IV Zosyn .  Analgesics as needed for pain. Antiemetics as needed for nausea/vomiting. She is NPO.  Continue IV fluids for hydration. Follow-up with general surgeon for further recommendations.    Colon cancer [facility]): Patient is currently not on chemotherapy. -Following up with Dr. Jacobo of oncology   Acute on chronic anemia, iron  deficiency anemia:  Hemoglobin dropped from 8-7. Transfuse 1 unit of PRBCs given underlying CHF.  Discussed risks, benefits and alternatives to blood transfusion.  Patient was agreeable to blood transfusion.   Hypomagnesemia: Magnesium  dropped from 1.4-1.2 even after IV magnesium  sulfate administration.  Replete with 4 g IV magnesium  sulfate.    Chronic CHF with midrange ejection fraction (congestive heart failure) (HCC): Compensated. 2D echo on 12/25/2022 showed EF of 45 to 50% with grade 1 diastolic dysfunction.      Chronic obstructive pulmonary disease (HCC): Stable -Bronchodilators and as needed Mucinex     Diabetes mellitus without complication (HCC): Recent A1c 7.0.  NovoLog  as needed for hyperglycemia    Chest pain:  CTA negative for PE.  Troponins negative - Continue home Ranexa     Comorbidities include  anxiety, depression, hyperlipidemia, hypertension, anxiety and depression      Diet Order             Diet NPO time specified Except for: Ice  Chips, Sips with Meds  Diet effective midnight                            Consultants: General Surgeon  Procedures: None    Medications:    atorvastatin   40 mg Oral Daily   clonazePAM   1 mg Oral BID   fenofibrate   54 mg Oral Daily   gabapentin   600 mg Oral BID   heparin   5,000 Units Subcutaneous Q8H   insulin  aspart  0-5 Units Subcutaneous QHS   insulin  aspart  0-9 Units Subcutaneous TID WC   metoprolol  succinate  50 mg Oral Daily   pantoprazole   40 mg Oral Daily   ranolazine   500 mg Oral BID   venlafaxine  XR  75 mg Oral Daily   Continuous Infusions:  sodium chloride  50 mL/hr at 06/16/24 0504   magnesium  sulfate bolus IVPB     piperacillin -tazobactam (ZOSYN )  IV 3.375 g (06/16/24 0813)     Anti-infectives (From admission, onward)    Start     Dose/Rate Route Frequency Ordered Stop   06/16/24 0000  piperacillin -tazobactam (ZOSYN ) IVPB 3.375 g        3.375 g 12.5 mL/hr over 240 Minutes Intravenous Every 8 hours 06/15/24 1956     06/15/24 1800  piperacillin -tazobactam (ZOSYN ) IVPB 3.375 g        3.375 g 100 mL/hr over 30 Minutes Intravenous  Once 06/15/24 1758 06/15/24 1859              Family Communication/Anticipated D/C date and plan/Code Status   DVT prophylaxis: heparin  injection 5,000 Units Start: 06/15/24 2200     Code Status: Full Code  Family Communication: None Disposition Plan: Plan to discharge home   Status is: Inpatient Remains inpatient appropriate because: Abdominal pain, intra-abdominal abscess       Subjective:   Interval events noted.  She complains of nausea, abdominal pain and dizziness.  No bloody stools.  Objective:    Vitals:   06/16/24 0518 06/16/24 0600 06/16/24 0630 06/16/24 0700  BP:  129/73 128/72 138/66  Pulse:  85 85 83  Resp:  14 17 14   Temp: 98.1 F (36.7 C)     TempSrc: Oral     SpO2:  96% 95% 95%  Weight:      Height:       No data found.   Intake/Output Summary (Last 24 hours)  at 06/16/2024 0839 Last data filed at 06/16/2024 0504 Gross per 24 hour  Intake 411.67 ml  Output --  Net 411.67 ml   Filed Weights   06/15/24 1309  Weight: 52.6 kg    Exam:  GEN: NAD SKIN: Warm and dry EYES: Pale but anicteric ENT: MMM CV: RRR PULM: CTA B ABD: soft, ND, midabdominal tenderness, no rebound tenderness or guarding, +BS CNS: AAO x 3, non focal EXT: No edema or tenderness        Data Reviewed:   I have personally reviewed following labs and imaging studies:  Labs: Labs show the following:   Basic Metabolic Panel: Recent Labs  Lab 06/15/24 0941 06/15/24 1237 06/16/24 0145  NA 134* 138 138  K 4.1 4.5 4.2  CL 101 106 108  CO2 24 25 23   GLUCOSE 143* 134* 97  BUN 21 20 13   CREATININE 0.82 0.88 0.54  CALCIUM  8.8* 9.0 8.2*  MG 1.4*  --  1.2*  PHOS  --  3.2  --    GFR Estimated Creatinine Clearance: 50.5 mL/min (by C-G formula based on SCr of 0.54 mg/dL). Liver Function Tests: Recent Labs  Lab 06/15/24 0941 06/15/24 1237  AST 14* 15  ALT 13 12  ALKPHOS 45 44  BILITOT 0.5 0.5  PROT 6.4* 6.6  ALBUMIN 2.9* 2.9*   Recent Labs  Lab 06/15/24 1237  LIPASE 29   No results for input(s): AMMONIA in the last 168 hours. Coagulation profile Recent Labs  Lab 06/15/24 2041  INR 1.0    CBC: Recent Labs  Lab 06/15/24 0941 06/15/24 1237 06/16/24 0337  WBC 12.8* 16.0* 11.0*  NEUTROABS 11.2*  --   --   HGB 7.6* 8.0* 7.0*  HCT 25.5* 26.9* 23.8*  MCV 85.3 85.9 85.9  PLT 370 450* 339   Cardiac Enzymes: No results for input(s): CKTOTAL, CKMB, CKMBINDEX, TROPONINI in the last 168 hours. BNP (last 3 results) No results for input(s): PROBNP in the last 8760 hours. CBG: Recent Labs  Lab 06/15/24 2149 06/16/24 0807  GLUCAP 116* 109*   D-Dimer: No results for input(s): DDIMER in the last 72 hours. Hgb A1c: Recent Labs    06/15/24 2040  HGBA1C 5.4   Lipid Profile: No results for input(s): CHOL, HDL, LDLCALC,  TRIG, CHOLHDL, LDLDIRECT in the last 72 hours. Thyroid function studies: No results for input(s): TSH, T4TOTAL, T3FREE, THYROIDAB in the last 72 hours.  Invalid input(s): FREET3 Anemia work up: No results for input(s): VITAMINB12, FOLATE, FERRITIN, TIBC, IRON , RETICCTPCT in the last 72 hours. Sepsis Labs: Recent Labs  Lab 06/15/24 0941 06/15/24 1237 06/15/24 1321 06/15/24 1807 06/15/24 2041 06/16/24 0337  WBC 12.8* 16.0*  --   --   --  11.0*  LATICACIDVEN  --   --  0.5 0.7 0.8  --     Microbiology Recent Results (from the past 240 hours)  Resp panel by RT-PCR (RSV, Flu A&B, Covid) Anterior Nasal Swab     Status: None   Collection Time: 06/15/24  1:21 PM   Specimen: Anterior Nasal Swab  Result Value Ref Range Status   SARS Coronavirus 2 by RT PCR NEGATIVE NEGATIVE Final    Comment: (NOTE) SARS-CoV-2 target nucleic acids are NOT DETECTED.  The SARS-CoV-2 RNA is generally detectable in upper respiratory specimens during the acute phase of infection. The lowest concentration of SARS-CoV-2 viral copies this assay can detect is 138 copies/mL. A negative result does not preclude SARS-Cov-2 infection and should not be used as the sole basis for treatment or other patient management decisions. A negative result may occur with  improper specimen collection/handling, submission of specimen other than nasopharyngeal swab, presence of viral mutation(s) within the areas targeted by this assay, and inadequate number of viral copies(<138 copies/mL). A negative result must be combined with clinical observations, patient history, and epidemiological information. The expected result is Negative.  Fact Sheet for Patients:  BloggerCourse.com  Fact Sheet for Healthcare Providers:  SeriousBroker.it  This test is no t yet approved or cleared by the [place]  FDA and  has been authorized for detection and/or  diagnosis of SARS-CoV-2 by FDA under an Emergency Use Authorization (EUA). This EUA will remain  in effect (meaning this test can be used) for the duration of the COVID-19 declaration under Section 564(b)(1) of the Act, 21 U.S.C.section 360bbb-3(b)(1), unless the authorization  is terminated  or revoked sooner.       Influenza A by PCR NEGATIVE NEGATIVE Final   Influenza B by PCR NEGATIVE NEGATIVE Final    Comment: (NOTE) The Xpert Xpress SARS-CoV-2/FLU/RSV plus assay is intended as an aid in the diagnosis of influenza from Nasopharyngeal swab specimens and should not be used as a sole basis for treatment. Nasal washings and aspirates are unacceptable for Xpert Xpress SARS-CoV-2/FLU/RSV testing.  Fact Sheet for Patients: BloggerCourse.com  Fact Sheet for Healthcare Providers: SeriousBroker.it  This test is not yet approved or cleared by the [place]  FDA and has been authorized for detection and/or diagnosis of SARS-CoV-2 by FDA under an Emergency Use Authorization (EUA). This EUA will remain in effect (meaning this test can be used) for the duration of the COVID-19 declaration under Section 564(b)(1) of the Act, 21 U.S.C. section 360bbb-3(b)(1), unless the authorization is terminated or revoked.     Resp Syncytial Virus by PCR NEGATIVE NEGATIVE Final    Comment: (NOTE) Fact Sheet for Patients: BloggerCourse.com  Fact Sheet for Healthcare Providers: SeriousBroker.it  This test is not yet approved or cleared by the [place]  FDA and has been authorized for detection and/or diagnosis of SARS-CoV-2 by FDA under an Emergency Use Authorization (EUA). This EUA will remain in effect (meaning this test can be used) for the duration of the COVID-19 declaration under Section 564(b)(1) of the Act, 21 U.S.C. section 360bbb-3(b)(1), unless the authorization is terminated  or revoked.  Performed at [facility], [address] [place], [place] [address]   Blood culture (routine x 2)     Status: None (Preliminary result)   Collection Time: 06/15/24  6:07 PM   Specimen: BLOOD  Result Value Ref Range Status   Specimen Description BLOOD RIGHT ANTECUBITAL  Final   Special Requests   Final    BOTTLES DRAWN AEROBIC AND ANAEROBIC Blood Culture adequate volume   Culture   Final    NO GROWTH < 24 HOURS Performed at [facility], [address], [place], [place] [address]    Report Status PENDING  Incomplete  Blood culture (routine x 2)     Status: None (Preliminary result)   Collection Time: 06/15/24  6:15 PM   Specimen: BLOOD  Result Value Ref Range Status   Specimen Description BLOOD LEFT ANTECUBITAL  Final   Special Requests   Final    BOTTLES DRAWN AEROBIC AND ANAEROBIC Blood Culture adequate volume   Culture   Final    NO GROWTH < 12 HOURS Performed at [facility], [address], [place], [place] [address]    Report Status PENDING  Incomplete    Procedures and diagnostic studies:  CT ABDOMEN PELVIS W CONTRAST Result Date: 06/15/2024 CLINICAL DATA:  Stage IV colon cancer. Abdominal pain, back pain and nausea and vomiting. * Tracking Code: BO * EXAM: CT ANGIOGRAPHY CHEST CT ABDOMEN AND PELVIS WITH CONTRAST TECHNIQUE: Multidetector CT imaging of the chest was performed using the standard protocol during bolus administration of intravenous contrast. Multiplanar CT image reconstructions and MIPs were obtained to evaluate the vascular anatomy. Multidetector CT imaging of the abdomen and pelvis was performed using the standard protocol during bolus administration of intravenous contrast. RADIATION DOSE REDUCTION: This exam was performed according to the departmental dose-optimization program which includes automated exposure control, adjustment of the mA and/or kV according to patient size and/or use of iterative reconstruction  technique. CONTRAST:  75mL OMNIPAQUE  IOHEXOL  350 MG/ML SOLN COMPARISON:  PET  04/22/2024. FINDINGS: CTA CHEST FINDINGS Cardiovascular: Negative for pulmonary embolus. Right IJ Port-A-Cath terminates in the right atrium. Atherosclerotic calcification of the aorta, aortic valve and coronary arteries. Enlarged pulmonic trunk and heart. No pericardial effusion. Mediastinum/Nodes: No pathologically enlarged mediastinal, hilar or axillary lymph nodes. Esophagus is grossly unremarkable. Lungs/Pleura: Centrilobular and paraseptal emphysema. Vague ground-glass in the lingula (6/82), likely scarring, unchanged from 04/22/2024. Scarring in the superior segment left lower lobe. No pleural fluid. Airway is unremarkable. Musculoskeletal: Healing or healed pathologic fracture of the posterior left fifth rib, as before. Old right prick rib fractures. Review of the MIP images confirms the above findings. CT ABDOMEN and PELVIS FINDINGS Hepatobiliary: Small cysts in the inferior right hepatic lobe. No specific follow-up necessary. Gallstone. No biliary ductal dilatation. Pancreas: Negative. Spleen: Negative. Adrenals/Urinary Tract: Slight thickening of the medial limb right adrenal gland. Heterogeneous left adrenal mass measures 2.5 x 3.7 cm, previously characterized as a myelolipoma. No specific follow-up necessary. Small low-attenuation lesion in the right kidney. No specific follow-up necessary. Kidneys are otherwise unremarkable. Ureters are decompressed. Bladder is grossly unremarkable. Stomach/Bowel: Postoperative changes in the mid and distal stomach with antral wall thickening and irregularity, hypermetabolic on PET 04/22/2024. Associated outpouching along the inferior margin of the gastric antrum contains fluid and air (2/33). Extended right hemicolectomy with an enterocolonic anastomosis in the left mid abdomen (2/32). Separate small bowel anastomosis in the high left anatomic pelvis with a large collection somewhat  thick-walled fluid and air along the medial margin of the enterocolonic anastomosis, measuring 4.5 x 5.3 cm (2/46). 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 Vascular/Lymphatic: Atherosclerotic calcification of the aorta. 10 mm left periaortic lymph node (2/32), stable. Similar small to borderline enlarged periportal lymph nodes. Reproductive: No adnexal mass. Other: Scattered ascites. No free air. Mesh repair along the ventral abdominal wall. Musculoskeletal: Degenerative changes in the spine. Review of the MIP images confirms the above findings. IMPRESSION: 1. Negative for pulmonary embolus. 2. Partial gastrectomy with fold thickening, irregularity and an outpouching along the anastomotic margin, worrisome for perforation/abscess and possible disease recurrence. 3. Small bowel resection and extended right hemicolectomy with an extraluminal collection of fluid and air associated with an enteric anastomosis in the high left anatomic pelvis, suggestive of an anastomotic leak and abscess. Again, disease recurrence may be the underlying etiology. 4. 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 5. Healing or healed pathologic fracture of the posterior left fifth rib, as discussed on PET 04/22/2024. 6. Cholelithiasis. 7. Left adrenal mass, previously characterized as a myelolipoma. 8. Scattered ascites. 9. Aortic atherosclerosis (ICD10-I70.0). Coronary artery calcification. 10. Enlarged pulmonic trunk, indicative of pulmonary arterial hypertension. 11.  Emphysema (ICD10-J43.9). Electronically Signed   By: Newell Eke M.D.   On: 06/15/2024 16:04   CT Angio Chest PE W/Cm &/Or Wo Cm Result Date: 06/15/2024 CLINICAL DATA:  Stage IV colon cancer. Abdominal pain, back pain and nausea and vomiting. * Tracking Code: BO * EXAM: CT ANGIOGRAPHY CHEST CT ABDOMEN AND PELVIS WITH CONTRAST TECHNIQUE: Multidetector CT imaging of the chest was performed using the standard protocol during  bolus administration of intravenous contrast. Multiplanar CT image reconstructions and MIPs were obtained to evaluate the vascular anatomy. Multidetector CT imaging of the abdomen and pelvis was performed using the standard protocol during bolus administration of intravenous contrast. RADIATION DOSE REDUCTION: This exam was performed according to the departmental dose-optimization program which includes automated exposure control, adjustment of the mA and/or kV according to patient size and/or use of  iterative reconstruction technique. CONTRAST:  75mL OMNIPAQUE  IOHEXOL  350 MG/ML SOLN COMPARISON:  PET 04/22/2024. FINDINGS: CTA CHEST FINDINGS Cardiovascular: Negative for pulmonary embolus. Right IJ Port-A-Cath terminates in the right atrium. Atherosclerotic calcification of the aorta, aortic valve and coronary arteries. Enlarged pulmonic trunk and heart. No pericardial effusion. Mediastinum/Nodes: No pathologically enlarged mediastinal, hilar or axillary lymph nodes. Esophagus is grossly unremarkable. Lungs/Pleura: Centrilobular and paraseptal emphysema. Vague ground-glass in the lingula (6/82), likely scarring, unchanged from 04/22/2024. Scarring in the superior segment left lower lobe. No pleural fluid. Airway is unremarkable. Musculoskeletal: Healing or healed pathologic fracture of the posterior left fifth rib, as before. Old right prick rib fractures. Review of the MIP images confirms the above findings. CT ABDOMEN and PELVIS FINDINGS Hepatobiliary: Small cysts in the inferior right hepatic lobe. No specific follow-up necessary. Gallstone. No biliary ductal dilatation. Pancreas: Negative. Spleen: Negative. Adrenals/Urinary Tract: Slight thickening of the medial limb right adrenal gland. Heterogeneous left adrenal mass measures 2.5 x 3.7 cm, previously characterized as a myelolipoma. No specific follow-up necessary. Small low-attenuation lesion in the right kidney. No specific follow-up necessary. Kidneys are  otherwise unremarkable. Ureters are decompressed. Bladder is grossly unremarkable. Stomach/Bowel: Postoperative changes in the mid and distal stomach with antral wall thickening and irregularity, hypermetabolic on PET 04/22/2024. Associated outpouching along the inferior margin of the gastric antrum contains fluid and air (2/33). Extended right hemicolectomy with an enterocolonic anastomosis in the left mid abdomen (2/32). Separate small bowel anastomosis in the high left anatomic pelvis with a large collection somewhat thick-walled fluid and air along the medial margin of the enterocolonic anastomosis, measuring 4.5 x 5.3 cm (2/46). 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 Vascular/Lymphatic: Atherosclerotic calcification of the aorta. 10 mm left periaortic lymph node (2/32), stable. Similar small to borderline enlarged periportal lymph nodes. Reproductive: No adnexal mass. Other: Scattered ascites. No free air. Mesh repair along the ventral abdominal wall. Musculoskeletal: Degenerative changes in the spine. Review of the MIP images confirms the above findings. IMPRESSION: 1. Negative for pulmonary embolus. 2. Partial gastrectomy with fold thickening, irregularity and an outpouching along the anastomotic margin, worrisome for perforation/abscess and possible disease recurrence. 3. Small bowel resection and extended right hemicolectomy with an extraluminal collection of fluid and air associated with an enteric anastomosis in the high left anatomic pelvis, suggestive of an anastomotic leak and abscess. Again, disease recurrence may be the underlying etiology. 4. 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 5. Healing or healed pathologic fracture of the posterior left fifth rib, as discussed on PET 04/22/2024. 6. Cholelithiasis. 7. Left adrenal mass, previously characterized as a myelolipoma. 8. Scattered ascites. 9. Aortic atherosclerosis (ICD10-I70.0). Coronary artery  calcification. 10. Enlarged pulmonic trunk, indicative of pulmonary arterial hypertension. 11.  Emphysema (ICD10-J43.9). Electronically Signed   By: Newell Eke M.D.   On: 06/15/2024 16:04               LOS: 1 day   Lanique Gonzalo  [facility]   Pager on [email or web address]. If 7PM-7AM, please contact night-coverage at [email or web address]     06/16/2024, 8:39 AM

## 2024-06-16 NOTE — Progress Notes (Signed)
 pt's blood sugar is slowly dropping, recent CBG is 79, pt is diabetic and NPO. Notify NP if IVF needs to be change to D10 instead. Awaiting for providers response. No other concern at the moment. Plan of care continued.

## 2024-06-17 DIAGNOSIS — R109 Unspecified abdominal pain: Secondary | ICD-10-CM | POA: Diagnosis not present

## 2024-06-17 LAB — TYPE AND SCREEN
ABO/RH(D): A POS
Antibody Screen: NEGATIVE
Unit division: 0

## 2024-06-17 LAB — MAGNESIUM: Magnesium: 1.8 mg/dL (ref 1.7–2.4)

## 2024-06-17 LAB — RENAL FUNCTION PANEL
Albumin: 2.1 g/dL — ABNORMAL LOW (ref 3.5–5.0)
Anion gap: 8 (ref 5–15)
BUN: 13 mg/dL (ref 8–23)
CO2: 23 mmol/L (ref 22–32)
Calcium: 8 mg/dL — ABNORMAL LOW (ref 8.9–10.3)
Chloride: 108 mmol/L (ref 98–111)
Creatinine, Ser: 0.71 mg/dL (ref 0.44–1.00)
GFR, Estimated: >60 mL/min (ref >60)
Glucose, Bld: 106 mg/dL — ABNORMAL HIGH (ref 70–99)
Phosphorus: 3.7 mg/dL (ref 2.5–4.6)
Potassium: 3.7 mmol/L (ref 3.5–5.1)
Sodium: 139 mmol/L (ref 135–145)

## 2024-06-17 LAB — CBC
HCT: 25.6 % — ABNORMAL LOW (ref 36.0–46.0)
Hemoglobin: 7.7 g/dL — ABNORMAL LOW (ref 12.0–15.0)
MCH: 25.2 pg — ABNORMAL LOW (ref 26.0–34.0)
MCHC: 30.1 g/dL (ref 30.0–36.0)
MCV: 83.9 fL (ref 80.0–100.0)
Platelets: 326 K/uL (ref 150–400)
RBC: 3.05 MIL/uL — ABNORMAL LOW (ref 3.87–5.11)
RDW: 16.7 % — ABNORMAL HIGH (ref 11.5–15.5)
WBC: 9.1 K/uL (ref 4.0–10.5)
nRBC: 0 % (ref 0.0–0.2)

## 2024-06-17 LAB — GLUCOSE, CAPILLARY
Glucose-Capillary: 104 mg/dL — ABNORMAL HIGH (ref 70–99)
Glucose-Capillary: 114 mg/dL — ABNORMAL HIGH (ref 70–99)
Glucose-Capillary: 129 mg/dL — ABNORMAL HIGH (ref 70–99)
Glucose-Capillary: 137 mg/dL — ABNORMAL HIGH (ref 70–99)

## 2024-06-17 LAB — BPAM RBC
Blood Product Expiration Date: 202508172359
ISSUE DATE / TIME: 202507170903
Unit Type and Rh: 6200

## 2024-06-17 MED ORDER — CHLORHEXIDINE GLUCONATE CLOTH 2 % EX PADS
6.0000 | MEDICATED_PAD | Freq: Every day | CUTANEOUS | Status: DC
  Administered 2024-06-17 – 2024-06-18 (×2): 6 via TOPICAL

## 2024-06-17 MED ORDER — SODIUM CHLORIDE 0.9% FLUSH
10.0000 mL | Freq: Two times a day (BID) | INTRAVENOUS | Status: DC
  Administered 2024-06-17 – 2024-06-18 (×3): 10 mL

## 2024-06-17 MED ORDER — SENNOSIDES-DOCUSATE SODIUM 8.6-50 MG PO TABS
1.0000 | ORAL_TABLET | Freq: Two times a day (BID) | ORAL | Status: DC
  Administered 2024-06-17 – 2024-06-18 (×2): 1 via ORAL
  Filled 2024-06-17 (×2): qty 1

## 2024-06-17 MED ORDER — OXYCODONE HCL ER 10 MG PO T12A
10.0000 mg | EXTENDED_RELEASE_TABLET | Freq: Two times a day (BID) | ORAL | Status: DC
  Administered 2024-06-17 – 2024-06-18 (×2): 10 mg via ORAL
  Filled 2024-06-17 (×2): qty 1

## 2024-06-17 MED ORDER — SODIUM CHLORIDE 0.9% FLUSH: 10.0000 mL | INTRAVENOUS | Status: DC | PRN

## 2024-06-17 MED ORDER — ENSURE PLUS HIGH PROTEIN PO LIQD
237.0000 mL | Freq: Two times a day (BID) | ORAL | Status: DC
  Administered 2024-06-17 – 2024-06-18 (×2): 237 mL via ORAL

## 2024-06-17 NOTE — Progress Notes (Signed)
 Transition of Care [facility]) - Inpatient Brief Assessment   Patient Details  Name: Jessica Stout MRN: [id number] Date of Birth: 04/06/1951  Transition of Care [facility]) CM/SW Contact:    Kyren Knick C Shanna Un, RN Phone Number: 06/17/2024, 11:33 AM   Clinical Narrative: TOC continuing to follow patient's progress throughout discharge planning.   Transition of Care Asessment:

## 2024-06-17 NOTE — Plan of Care (Signed)

## 2024-06-17 NOTE — Consult Note (Signed)
 Palliative Medicine [facility] at [facility] [phone] [phone]) [phone]   Name: Jessica Stout Date: 06/17/2024 MRN: [id number]  DOB: [birth year]  Patient Care Team: Corlis Honor BROCKS, MD as PCP - General (Internal Medicine) Darliss Rogue, MD as PCP - Cardiology (Cardiology) Maurie Rayfield BIRCH, RN as Oncology Nurse Navigator Jacobo, Evalene PARAS, MD as Consulting Physician (Oncology) Lenn Aran, MD as Consulting Physician (Radiation Oncology)    REASON FOR CONSULTATION: Jessica Stout is a 73 y.o. 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  Patient was admitted to hospital on 06/15/2024 with intractable abdominal pain.  CT of the abdomen and pelvis concerning for intra-abdominal abscess.  Palliative care was consulted to address goals.   SOCIAL HISTORY:     reports that she quit smoking about 18 months ago. Her smoking use included e-cigarettes. She does not have any smokeless tobacco history on file. She reports current alcohol use. She reports current drug use. Drug: Marijuana.  Patient is married and lives at home with her husband and two cats.  She has a son who lives nearby and is involved.  Patient previously worked as a Neurosurgeon.  ADVANCE DIRECTIVES:  None on file  CODE STATUS: Full code  PAST MEDICAL HISTORY: Past Medical History:  Diagnosis Date   Anemia    Anxiety    Asthma    Cancer (HCC)    colon   CHF (congestive heart failure) (HCC)    COPD (chronic obstructive pulmonary disease) (HCC)    Diabetes mellitus without complication (HCC)    Hyperlipidemia    Hypertension    Stroke [facility])     PAST  SURGICAL HISTORY:  Past Surgical History:  Procedure Laterality Date   BIOPSY  06/26/2023   Procedure: BIOPSY;  Surgeon: Maryruth Ole DASEN, MD;  Location: [facility] ENDOSCOPY;  Service: Endoscopy;;   COLONOSCOPY N/A 06/26/2023   Procedure: COLONOSCOPY;  Surgeon: Maryruth Ole DASEN, MD;  Location: [facility] ENDOSCOPY;  Service: Endoscopy;  Laterality: N/A;   COLONOSCOPY WITH PROPOFOL  N/A 02/03/2022   Procedure: COLONOSCOPY WITH PROPOFOL ;  Surgeon: Unk Corinn Skiff, MD;  Location: [facility] ENDOSCOPY;  Service: Gastroenterology;  Laterality: N/A;   ESOPHAGOGASTRODUODENOSCOPY N/A 06/26/2023   Procedure: ESOPHAGOGASTRODUODENOSCOPY (EGD);  Surgeon: Maryruth Ole DASEN, MD;  Location: [facility] ENDOSCOPY;  Service: Endoscopy;  Laterality: N/A;   ESOPHAGOGASTRODUODENOSCOPY (EGD) WITH PROPOFOL  N/A 02/03/2022   Procedure: ESOPHAGOGASTRODUODENOSCOPY (EGD) WITH PROPOFOL ;  Surgeon: Unk Corinn Skiff, MD;  Location: [facility] ENDOSCOPY;  Service: Gastroenterology;  Laterality: N/A;   GIVENS CAPSULE STUDY N/A 02/03/2022   Procedure: GIVENS CAPSULE STUDY;  Surgeon: Unk Corinn Skiff, MD;  Location: [facility] ENDOSCOPY;  Service: Gastroenterology;  Laterality: N/A;  possible Capsule Study   IR IMAGING GUIDED PORT INSERTION  06/18/2022   POLYPECTOMY  06/26/2023   Procedure: POLYPECTOMY;  Surgeon: Maryruth Ole DASEN, MD;  Location: [facility] ENDOSCOPY;  Service: Endoscopy;;    HEMATOLOGY/ONCOLOGY HISTORY:  Oncology History  Colon cancer (HCC)  02/04/2022 Initial Diagnosis   Colon cancer (HCC)   06/10/2022 Cancer Staging   Staging form: Colon and Rectum, AJCC 8th Edition - Pathologic stage from 06/10/2022: Stage IIIC (pT4b, pN1c, cM0) - Signed by Jacobo Evalene PARAS, MD on 06/10/2022 Stage prefix: Initial diagnosis Total positive nodes: 0 Histologic grading  system: 4 grade system Histologic grade (G): G3   06/24/2022 - 07/08/2022 Chemotherapy   Patient is on Treatment Plan : COLORECTAL FOLFOX q14d x 6 months     06/24/2022 - 12/25/2022  Chemotherapy   Patient is on Treatment Plan : COLORECTAL FOLFOX q14d x 6 months     06/09/2023 - 07/23/2023 Chemotherapy   Patient is on Treatment Plan : COLORECTAL FOLFIRI q14d     08/04/2023 -  Chemotherapy   Patient is on Treatment Plan : COLORECTAL FOLFIRI + Bevacizumab  q14d       ALLERGIES:  is allergic to codeine.  MEDICATIONS:  Current Facility-Administered Medications  Medication Dose Route Frequency Provider Last Rate Last Admin   acetaminophen  (TYLENOL ) tablet 650 mg  650 mg Oral Q6H PRN Niu, Xilin, MD       albuterol  (PROVENTIL ) (2.5 MG/3ML) 0.083% nebulizer solution 3 mL  3 mL Nebulization Q4H PRN Niu, Xilin, MD       atorvastatin  (LIPITOR) tablet 40 mg  40 mg Oral Daily Niu, Xilin, MD   40 mg at 06/16/24 2040   Chlorhexidine  Gluconate Cloth 2 % PADS 6 each  6 each Topical Daily Jens Durand, MD   6 each at 06/17/24 1050   clonazePAM  (KLONOPIN ) tablet 1 mg  1 mg Oral BID Niu, Xilin, MD   1 mg at 06/17/24 0847   dextromethorphan-guaiFENesin  (MUCINEX  DM) 30-600 MG per 12 hr tablet 1 tablet  1 tablet Oral BID PRN Niu, Xilin, MD       feeding supplement (ENSURE PLUS HIGH PROTEIN) liquid 237 mL  237 mL Oral BID BM Jens Durand, MD   237 mL at 06/17/24 1416   fenofibrate  tablet 54 mg  54 mg Oral Daily Niu, Xilin, MD   54 mg at 06/16/24 2221   gabapentin  (NEURONTIN ) capsule 600 mg  600 mg Oral BID Niu, Xilin, MD   600 mg at 06/17/24 0847   heparin  injection 5,000 Units  5,000 Units Subcutaneous Q8H Niu, Xilin, MD   5,000 Units at 06/17/24 1416   hydrALAZINE  (APRESOLINE ) injection 5 mg  5 mg Intravenous Q2H PRN Niu, Xilin, MD       HYDROmorphone  (DILAUDID ) injection 0.5 mg  0.5 mg Intravenous Q3H PRN Niu, Xilin, MD   0.5 mg at 06/15/24 2030   insulin  aspart (novoLOG ) injection 0-5 Units  0-5 Units Subcutaneous QHS Niu, Xilin, MD       insulin  aspart (novoLOG ) injection 0-9 Units  0-9 Units Subcutaneous TID WC Niu, Xilin, MD       loperamide  (IMODIUM ) capsule 2 mg  2 mg Oral BID PRN  Niu, Xilin, MD       metoprolol  succinate (TOPROL -XL) 24 hr tablet 50 mg  50 mg Oral Daily Niu, Xilin, MD   50 mg at 06/17/24 0848   ondansetron  (ZOFRAN ) injection 4 mg  4 mg Intravenous Q8H PRN Niu, Xilin, MD   4 mg at 06/15/24 2029   oxyCODONE  (Oxy IR/ROXICODONE ) immediate release tablet 5 mg  5 mg Oral Q6H PRN Niu, Xilin, MD   5 mg at 06/17/24 0905   pantoprazole  (PROTONIX ) EC tablet 40 mg  40 mg Oral Daily Niu, Xilin, MD   40 mg at 06/16/24 1743   piperacillin -tazobactam (ZOSYN ) IVPB 3.375 g  3.375 g Intravenous Q8H Merrill, Kristin A, RPH 12.5 mL/hr at 06/17/24 1508 3.375 g at 06/17/24 1508   ranolazine  (RANEXA ) 12 hr tablet 500 mg  500 mg Oral BID Niu, Xilin, MD   500 mg at 06/17/24  9152   sodium chloride  flush (NS) 0.9 % injection 10-40 mL  10-40 mL Intracatheter Q12H Jens Durand, MD   10 mL at 06/17/24 0848   sodium chloride  flush (NS) 0.9 % injection 10-40 mL  10-40 mL Intracatheter PRN Jens Durand, MD       venlafaxine  XR (EFFEXOR -XR) 24 hr capsule 75 mg  75 mg Oral Daily Niu, Xilin, MD   75 mg at 06/17/24 0847    VITAL SIGNS: BP (!) 108/56 (BP Location: Left Arm)   Pulse 71   Temp 98.2 F (36.8 C)   Resp 14   Ht 5' 4 (1.626 m)   Wt 123 lb 14.4 oz (56.2 kg)   SpO2 94%   BMI 21.27 kg/m  Filed Weights   06/15/24 1309 06/17/24 0500  Weight: 116 lb (52.6 kg) 123 lb 14.4 oz (56.2 kg)    Estimated body mass index is 21.27 kg/m as calculated from the following:   Height as of this encounter: 5' 4 (1.626 m).   Weight as of this encounter: 123 lb 14.4 oz (56.2 kg).  LABS: CBC:    Component Value Date/Time   WBC 9.1 06/17/2024 0500   HGB 7.7 (L) 06/17/2024 0500   HGB 8.9 (L) 04/04/2024 1416   HCT 25.6 (L) 06/17/2024 0500   PLT 326 06/17/2024 0500   PLT 275 04/04/2024 1416   MCV 83.9 06/17/2024 0500   NEUTROABS 11.2 (H) 06/15/2024 0941   LYMPHSABS 0.8 06/15/2024 0941   MONOABS 0.6 06/15/2024 0941   EOSABS 0.0 06/15/2024 0941   BASOSABS 0.1 06/15/2024 0941    Comprehensive Metabolic Panel:    Component Value Date/Time   NA 139 06/17/2024 0500   K 3.7 06/17/2024 0500   CL 108 06/17/2024 0500   CO2 23 06/17/2024 0500   BUN 13 06/17/2024 0500   CREATININE 0.71 06/17/2024 0500   CREATININE 0.82 06/15/2024 0941   GLUCOSE 106 (H) 06/17/2024 0500   CALCIUM  8.0 (L) 06/17/2024 0500   AST 15 06/15/2024 1237   AST 14 (L) 06/15/2024 0941   ALT 12 06/15/2024 1237   ALT 13 06/15/2024 0941   ALKPHOS 44 06/15/2024 1237   BILITOT 0.5 06/15/2024 1237   BILITOT 0.5 06/15/2024 0941   PROT 6.6 06/15/2024 1237   ALBUMIN 2.1 (L) 06/17/2024 0500    RADIOGRAPHIC STUDIES: CT ABDOMEN PELVIS W CONTRAST Result Date: 06/15/2024 CLINICAL DATA:  Stage IV colon cancer. Abdominal pain, back pain and nausea and vomiting. * Tracking Code: BO * EXAM: CT ANGIOGRAPHY CHEST CT ABDOMEN AND PELVIS WITH CONTRAST TECHNIQUE: Multidetector CT imaging of the chest was performed using the standard protocol during bolus administration of intravenous contrast. Multiplanar CT image reconstructions and MIPs were obtained to evaluate the vascular anatomy. Multidetector CT imaging of the abdomen and pelvis was performed using the standard protocol during bolus administration of intravenous contrast. RADIATION DOSE REDUCTION: This exam was performed according to the departmental dose-optimization program which includes automated exposure control, adjustment of the mA and/or kV according to patient size and/or use of iterative reconstruction technique. CONTRAST:  75mL OMNIPAQUE  IOHEXOL  350 MG/ML SOLN COMPARISON:  PET 04/22/2024. FINDINGS: CTA CHEST FINDINGS Cardiovascular: Negative for pulmonary embolus. Right IJ Port-A-Cath terminates in the right atrium. Atherosclerotic calcification of the aorta, aortic valve and coronary arteries. Enlarged pulmonic trunk and heart. No pericardial effusion. Mediastinum/Nodes: No pathologically enlarged mediastinal, hilar or axillary lymph nodes. Esophagus is  grossly unremarkable. Lungs/Pleura: Centrilobular and paraseptal emphysema. Vague ground-glass in the lingula (6/82), likely scarring,  unchanged from 04/22/2024. Scarring in the superior segment left lower lobe. No pleural fluid. Airway is unremarkable. Musculoskeletal: Healing or healed pathologic fracture of the posterior left fifth rib, as before. Old right prick rib fractures. Review of the MIP images confirms the above findings. CT ABDOMEN and PELVIS FINDINGS Hepatobiliary: Small cysts in the inferior right hepatic lobe. No specific follow-up necessary. Gallstone. No biliary ductal dilatation. Pancreas: Negative. Spleen: Negative. Adrenals/Urinary Tract: Slight thickening of the medial limb right adrenal gland. Heterogeneous left adrenal mass measures 2.5 x 3.7 cm, previously characterized as a myelolipoma. No specific follow-up necessary. Small low-attenuation lesion in the right kidney. No specific follow-up necessary. Kidneys are otherwise unremarkable. Ureters are decompressed. Bladder is grossly unremarkable. Stomach/Bowel: Postoperative changes in the mid and distal stomach with antral wall thickening and irregularity, hypermetabolic on PET 04/22/2024. Associated outpouching along the inferior margin of the gastric antrum contains fluid and air (2/33). Extended right hemicolectomy with an enterocolonic anastomosis in the left mid abdomen (2/32). Separate small bowel anastomosis in the high left anatomic pelvis with a large collection somewhat thick-walled fluid and air along the medial margin of the enterocolonic anastomosis, measuring 4.5 x 5.3 cm (2/46). 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 Vascular/Lymphatic: Atherosclerotic calcification of the aorta. 10 mm left periaortic lymph node (2/32), stable. Similar small to borderline enlarged periportal lymph nodes. Reproductive: No adnexal mass. Other: Scattered ascites. No free air. Mesh repair along the ventral abdominal  wall. Musculoskeletal: Degenerative changes in the spine. Review of the MIP images confirms the above findings. IMPRESSION: 1. Negative for pulmonary embolus. 2. Partial gastrectomy with fold thickening, irregularity and an outpouching along the anastomotic margin, worrisome for perforation/abscess and possible disease recurrence. 3. Small bowel resection and extended right hemicolectomy with an extraluminal collection of fluid and air associated with an enteric anastomosis in the high left anatomic pelvis, suggestive of an anastomotic leak and abscess. Again, disease recurrence may be the underlying etiology. 4. 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 5. Healing or healed pathologic fracture of the posterior left fifth rib, as discussed on PET 04/22/2024. 6. Cholelithiasis. 7. Left adrenal mass, previously characterized as a myelolipoma. 8. Scattered ascites. 9. Aortic atherosclerosis (ICD10-I70.0). Coronary artery calcification. 10. Enlarged pulmonic trunk, indicative of pulmonary arterial hypertension. 11.  Emphysema (ICD10-J43.9). Electronically Signed   By: Newell Eke M.D.   On: 06/15/2024 16:04   CT Angio Chest PE W/Cm &/Or Wo Cm Result Date: 06/15/2024 CLINICAL DATA:  Stage IV colon cancer. Abdominal pain, back pain and nausea and vomiting. * Tracking Code: BO * EXAM: CT ANGIOGRAPHY CHEST CT ABDOMEN AND PELVIS WITH CONTRAST TECHNIQUE: Multidetector CT imaging of the chest was performed using the standard protocol during bolus administration of intravenous contrast. Multiplanar CT image reconstructions and MIPs were obtained to evaluate the vascular anatomy. Multidetector CT imaging of the abdomen and pelvis was performed using the standard protocol during bolus administration of intravenous contrast. RADIATION DOSE REDUCTION: This exam was performed according to the departmental dose-optimization program which includes automated exposure control, adjustment of the mA and/or kV  according to patient size and/or use of iterative reconstruction technique. CONTRAST:  75mL OMNIPAQUE  IOHEXOL  350 MG/ML SOLN COMPARISON:  PET 04/22/2024. FINDINGS: CTA CHEST FINDINGS Cardiovascular: Negative for pulmonary embolus. Right IJ Port-A-Cath terminates in the right atrium. Atherosclerotic calcification of the aorta, aortic valve and coronary arteries. Enlarged pulmonic trunk and heart. No pericardial effusion. Mediastinum/Nodes: No pathologically enlarged mediastinal, hilar or axillary lymph nodes. Esophagus is grossly  unremarkable. Lungs/Pleura: Centrilobular and paraseptal emphysema. Vague ground-glass in the lingula (6/82), likely scarring, unchanged from 04/22/2024. Scarring in the superior segment left lower lobe. No pleural fluid. Airway is unremarkable. Musculoskeletal: Healing or healed pathologic fracture of the posterior left fifth rib, as before. Old right prick rib fractures. Review of the MIP images confirms the above findings. CT ABDOMEN and PELVIS FINDINGS Hepatobiliary: Small cysts in the inferior right hepatic lobe. No specific follow-up necessary. Gallstone. No biliary ductal dilatation. Pancreas: Negative. Spleen: Negative. Adrenals/Urinary Tract: Slight thickening of the medial limb right adrenal gland. Heterogeneous left adrenal mass measures 2.5 x 3.7 cm, previously characterized as a myelolipoma. No specific follow-up necessary. Small low-attenuation lesion in the right kidney. No specific follow-up necessary. Kidneys are otherwise unremarkable. Ureters are decompressed. Bladder is grossly unremarkable. Stomach/Bowel: Postoperative changes in the mid and distal stomach with antral wall thickening and irregularity, hypermetabolic on PET 04/22/2024. Associated outpouching along the inferior margin of the gastric antrum contains fluid and air (2/33). Extended right hemicolectomy with an enterocolonic anastomosis in the left mid abdomen (2/32). Separate small bowel anastomosis in the  high left anatomic pelvis with a large collection somewhat thick-walled fluid and air along the medial margin of the enterocolonic anastomosis, measuring 4.5 x 5.3 cm (2/46). 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 Vascular/Lymphatic: Atherosclerotic calcification of the aorta. 10 mm left periaortic lymph node (2/32), stable. Similar small to borderline enlarged periportal lymph nodes. Reproductive: No adnexal mass. Other: Scattered ascites. No free air. Mesh repair along the ventral abdominal wall. Musculoskeletal: Degenerative changes in the spine. Review of the MIP images confirms the above findings. IMPRESSION: 1. Negative for pulmonary embolus. 2. Partial gastrectomy with fold thickening, irregularity and an outpouching along the anastomotic margin, worrisome for perforation/abscess and possible disease recurrence. 3. Small bowel resection and extended right hemicolectomy with an extraluminal collection of fluid and air associated with an enteric anastomosis in the high left anatomic pelvis, suggestive of an anastomotic leak and abscess. Again, disease recurrence may be the underlying etiology. 4. 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 5. Healing or healed pathologic fracture of the posterior left fifth rib, as discussed on PET 04/22/2024. 6. Cholelithiasis. 7. Left adrenal mass, previously characterized as a myelolipoma. 8. Scattered ascites. 9. Aortic atherosclerosis (ICD10-I70.0). Coronary artery calcification. 10. Enlarged pulmonic trunk, indicative of pulmonary arterial hypertension. 11.  Emphysema (ICD10-J43.9). Electronically Signed   By: Newell Eke M.D.   On: 06/15/2024 16:04    PERFORMANCE STATUS (ECOG) : 2 - Symptomatic, <50% confined to bed  Review of Systems Unless otherwise noted, a complete review of systems is negative.  Physical Exam General: NAD Cardiovascular: regular rate and rhythm Pulmonary: clear ant fields Abdomen: soft,  nontender, + bowel sounds GU: no suprapubic tenderness Extremities: no edema, no joint deformities Skin: no rashes Neurological: Weakness but otherwise nonfocal  IMPRESSION: Patient with stage III colon cancer not currently on chemotherapy.  She is admitted to hospital with intra-abdominal abscess.  Patient is being treated on antibiotics.  No indication for surgery.  I was asked to consult to assist with pain management.  Patient reports improved pain today.  She is on as needed oxycodone .  Has not required recent utilization of as needed IV hydromorphone .  In past 24 hours, patient has had total of 15 mg oxycodone .  At home, patient was being managed on as needed oxycodone .  She found that regimen somewhat effective but extremely short-lived.  She reports that it wore off  prior to the next dose being due.  She was also waking at night in pain.  Pain was limiting her daily functioning and negatively impacting her quality of life. Patient was tolerating opioids without any report of adverse effects.   We had previously discussed the option of starting her on a long-acting opioid.  Will start her on OxyContin  10 mg every 12 hours while she is in the hospital to ensure tolerance.  Will continue oxycodone  IR as needed for breakthrough pain.  PLAN: - Continue current scope of treatment - Start OxyContin  10 mg every 12 hours - Continue oxycodone  5 mg every 6 hours as needed for breakthrough pain - Bowel regimen - Will plan outpatient follow-up  Case and plan discussed with Dr. Jacobo   Time Total: 30 minutes  Visit consisted of counseling and education dealing with the complex and emotionally intense issues of symptom management and palliative care in the setting of serious and potentially life-threatening illness.Greater than 50%  of this time was spent counseling and coordinating care related to the above assessment and plan.  Signed by: Fonda Mower, PhD, NP-C

## 2024-06-17 NOTE — Progress Notes (Addendum)
 Progress Note    Jessica Stout  FMW:[id number] DOB: October 25, 1951  DOA: 06/15/2024 PCP: Corlis Honor BROCKS, MD      Brief Narrative:    Medical records reviewed and are as summarized below:  Jessica Stout is a 73 y.o. female with medical history significant of colon cancer s/p of chemotherapy, s/p of partial gastrotomy, s/p of right hemicolectomy), HTN, HLD, DM, COPD/asthma, CHF with midrange EF 45-50%, stroke, depression with anxiety, anemia, who presented to the hospital with nausea, vomiting and abdominal pain.  She said that she has worsening abdominal pain for about 4 weeks, associated with nausea and intermittent vomiting with nonbilious nonbloody vomiting.  She has chronic diarrhea at baseline which has not changed much.  She had some central chest pain, mild shortness of breath and cough with clear mucus production.  She was seen at the cancer clinic the day of admission and was given 4 g of morphine  sulfate and 8 mg of Zofran .  Significant labs: WBC 12.8, hemoglobin 7.6, magnesium  1.4, albumin 2.9.  CT abdomen and pelvis  1. Negative for pulmonary embolus. 2. Partial gastrectomy with fold thickening, irregularity and an outpouching along the anastomotic margin, worrisome for perforation/abscess and possible disease recurrence. 3. Small bowel resection and extended right hemicolectomy with an extraluminal collection of fluid and air associated with an enteric anastomosis in the high left anatomic pelvis, suggestive of an anastomotic leak and abscess. Again, disease recurrence may be the underlying etiology. 4. 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 5. Healing or healed pathologic fracture of the posterior left fifth rib, as discussed on PET 04/22/2024. 6. Cholelithiasis. 7. Left adrenal mass, previously characterized as a myelolipoma. 8. Scattered ascites. 9. Aortic atherosclerosis (ICD10-I70.0). Coronary artery calcification. 10. Enlarged pulmonic trunk,  indicative of pulmonary arterial hypertension. 11.  Emphysema (ICD10-J43.9).        Assessment/Plan:   Principal Problem:   Abdominal pain Active Problems:   Intra-abdominal abscess (HCC)   Colon cancer (HCC)   Chronic systolic CHF (congestive heart failure) (HCC)   Hyperlipidemia   HTN (hypertension)   Chronic obstructive pulmonary disease (HCC)   Diabetes mellitus without complication (HCC)   Iron  deficiency anemia   Chest pain   Anxiety and depression   Hypomagnesemia   Abnormal abdominal CT scan   Nausea vomiting and diarrhea    Body mass index is 21.27 kg/m.   Abdominal pain due to intra-abdominal abscess [facility]):  Continue analgesics as needed for pain (IV Dilaudid  and oxycodone ). Continue IV Zosyn .  Plan to switch to Augmentin  tomorrow Start diet and discontinue IV fluids.  Discussed with Dr. Lane general surgeon.  He has signed off the case because there is no indication for surgery at this time. Antiemetics as needed for nausea/vomiting.    Stage III colon cancer [facility]): Patient is currently not on chemotherapy. Patient said her home pain regimen is inadequate for pain control.  Consulted charge, NP, with palliative care to assist with pain management. Patient follow-up with Dr. Jacobo, oncologist.  He was updated about plan of care via secure chat.   Acute on chronic anemia, iron  deficiency anemia:  Hemoglobin up from 7 to 7.7 S/p transfusion with 1 unit of PRBCs on 06/16/2024.   Hypomagnesemia: Improved    Chronic CHF with midrange ejection fraction (congestive heart failure) (HCC): Compensated. 2D echo on 12/25/2022 showed EF of 45 to 50% with grade 1 diastolic dysfunction.      Chronic obstructive pulmonary disease (HCC): Stable -Bronchodilators and  as needed Mucinex     Diabetes mellitus without complication [facility]): Recent A1c 7.0.  NovoLog  as needed for hyperglycemia    Chest pain:  CTA negative for PE.  Troponins negative - Continue  home Ranexa     Comorbidities include anxiety, depression, hyperlipidemia, hypertension, anxiety and depression      Diet Order             Diet Carb Modified Fluid consistency: Thin  Diet effective now                            Consultants: General Surgeon Palliative care  Procedures: None    Medications:    atorvastatin   40 mg Oral Daily   Chlorhexidine  Gluconate Cloth  6 each Topical Daily   clonazePAM   1 mg Oral BID   feeding supplement  237 mL Oral BID BM   fenofibrate   54 mg Oral Daily   gabapentin   600 mg Oral BID   heparin   5,000 Units Subcutaneous Q8H   insulin  aspart  0-5 Units Subcutaneous QHS   insulin  aspart  0-9 Units Subcutaneous TID WC   metoprolol  succinate  50 mg Oral Daily   pantoprazole   40 mg Oral Daily   ranolazine   500 mg Oral BID   sodium chloride  flush  10-40 mL Intracatheter Q12H   venlafaxine  XR  75 mg Oral Daily   Continuous Infusions:  piperacillin -tazobactam (ZOSYN )  IV 3.375 g (06/17/24 0900)     Anti-infectives (From admission, onward)    Start     Dose/Rate Route Frequency Ordered Stop   06/16/24 0000  piperacillin -tazobactam (ZOSYN ) IVPB 3.375 g        3.375 g 12.5 mL/hr over 240 Minutes Intravenous Every 8 hours 06/15/24 1956     06/15/24 1800  piperacillin -tazobactam (ZOSYN ) IVPB 3.375 g        3.375 g 100 mL/hr over 30 Minutes Intravenous  Once 06/15/24 1758 06/15/24 1859              Family Communication/Anticipated D/C date and plan/Code Status   DVT prophylaxis: heparin  injection 5,000 Units Start: 06/15/24 2200     Code Status: Full Code  Family Communication: None Disposition Plan: Plan to discharge home   Status is: Inpatient Remains inpatient appropriate because: Abdominal pain, intra-abdominal abscess       Subjective:   Interval events noted.  Abdominal pain is better today and is graded as 3/10 in severity.  However, she reports that pain regimen at home is inadequate  for pain control.  Objective:    Vitals:   06/17/24 0449 06/17/24 0500 06/17/24 0745 06/17/24 1133  BP: 122/64  119/64 (!) 108/56  Pulse: 70  72 71  Resp: 18  14 14   Temp: 97.8 F (36.6 C)  97.8 F (36.6 C) 98.2 F (36.8 C)  TempSrc:      SpO2: 96%  92% 94%  Weight:  56.2 kg    Height:       No data found.   Intake/Output Summary (Last 24 hours) at 06/17/2024 1350 Last data filed at 06/17/2024 0848 Gross per 24 hour  Intake 447.76 ml  Output --  Net 447.76 ml   Filed Weights   06/15/24 1309 06/17/24 0500  Weight: 52.6 kg 56.2 kg    Exam:  GEN: NAD SKIN: Developments noted. EYES: No pallor or icterus ENT: MMM CV: RRR PULM: CTA B ABD: soft, ND, mild mid abdominal tenderness, +BS CNS: AAO  x 3, non focal EXT: No edema or tenderness       Data Reviewed:   I have personally reviewed following labs and imaging studies:  Labs: Labs show the following:   Basic Metabolic Panel: Recent Labs  Lab 06/15/24 0941 06/15/24 1237 06/16/24 0145 06/17/24 0500  NA 134* 138 138 139  K 4.1 4.5 4.2 3.7  CL 101 106 108 108  CO2 24 25 23 23   GLUCOSE 143* 134* 97 106*  BUN 21 20 13 13   CREATININE 0.82 0.88 0.54 0.71  CALCIUM  8.8* 9.0 8.2* 8.0*  MG 1.4*  --  1.2* 1.8  PHOS  --  3.2  --  3.7   GFR Estimated Creatinine Clearance: 52.5 mL/min (by C-G formula based on SCr of 0.71 mg/dL). Liver Function Tests: Recent Labs  Lab 06/15/24 0941 06/15/24 1237 06/17/24 0500  AST 14* 15  --   ALT 13 12  --   ALKPHOS 45 44  --   BILITOT 0.5 0.5  --   PROT 6.4* 6.6  --   ALBUMIN 2.9* 2.9* 2.1*   Recent Labs  Lab 06/15/24 1237  LIPASE 29   No results for input(s): AMMONIA in the last 168 hours. Coagulation profile Recent Labs  Lab 06/15/24 2041  INR 1.0    CBC: Recent Labs  Lab 06/15/24 0941 06/15/24 1237 06/16/24 0337 06/17/24 0500  WBC 12.8* 16.0* 11.0* 9.1  NEUTROABS 11.2*  --   --   --   HGB 7.6* 8.0* 7.0* 7.7*  HCT 25.5* 26.9* 23.8* 25.6*   MCV 85.3 85.9 85.9 83.9  PLT 370 450* 339 326   Cardiac Enzymes: No results for input(s): CKTOTAL, CKMB, CKMBINDEX, TROPONINI in the last 168 hours. BNP (last 3 results) No results for input(s): PROBNP in the last 8760 hours. CBG: Recent Labs  Lab 06/16/24 1112 06/16/24 1734 06/16/24 2025 06/17/24 0822 06/17/24 1234  GLUCAP 99 82 79 104* 114*   D-Dimer: No results for input(s): DDIMER in the last 72 hours. Hgb A1c: Recent Labs    06/15/24 2040  HGBA1C 5.4   Lipid Profile: No results for input(s): CHOL, HDL, LDLCALC, TRIG, CHOLHDL, LDLDIRECT in the last 72 hours. Thyroid function studies: No results for input(s): TSH, T4TOTAL, T3FREE, THYROIDAB in the last 72 hours.  Invalid input(s): FREET3 Anemia work up: No results for input(s): VITAMINB12, FOLATE, FERRITIN, TIBC, IRON , RETICCTPCT in the last 72 hours. Sepsis Labs: Recent Labs  Lab 06/15/24 0941 06/15/24 1237 06/15/24 1321 06/15/24 1807 06/15/24 2041 06/16/24 0337 06/17/24 0500  WBC 12.8* 16.0*  --   --   --  11.0* 9.1  LATICACIDVEN  --   --  0.5 0.7 0.8  --   --     Microbiology Recent Results (from the past 240 hours)  Resp panel by RT-PCR (RSV, Flu A&B, Covid) Anterior Nasal Swab     Status: None   Collection Time: 06/15/24  1:21 PM   Specimen: Anterior Nasal Swab  Result Value Ref Range Status   SARS Coronavirus 2 by RT PCR NEGATIVE NEGATIVE Final    Comment: (NOTE) SARS-CoV-2 target nucleic acids are NOT DETECTED.  The SARS-CoV-2 RNA is generally detectable in upper respiratory specimens during the acute phase of infection. The lowest concentration of SARS-CoV-2 viral copies this assay can detect is 138 copies/mL. A negative result does not preclude SARS-Cov-2 infection and should not be used as the sole basis for treatment or other patient management decisions. A negative result may occur with  improper specimen collection/handling, submission of  specimen other than nasopharyngeal swab, presence of viral mutation(s) within the areas targeted by this assay, and inadequate number of viral copies(<138 copies/mL). A negative result must be combined with clinical observations, patient history, and epidemiological information. The expected result is Negative.  Fact Sheet for Patients:  BloggerCourse.com  Fact Sheet for Healthcare Providers:  SeriousBroker.it  This test is no t yet approved or cleared by the [place]  FDA and  has been authorized for detection and/or diagnosis of SARS-CoV-2 by FDA under an Emergency Use Authorization (EUA). This EUA will remain  in effect (meaning this test can be used) for the duration of the COVID-19 declaration under Section 564(b)(1) of the Act, 21 U.S.C.section 360bbb-3(b)(1), unless the authorization is terminated  or revoked sooner.       Influenza A by PCR NEGATIVE NEGATIVE Final   Influenza B by PCR NEGATIVE NEGATIVE Final    Comment: (NOTE) The Xpert Xpress SARS-CoV-2/FLU/RSV plus assay is intended as an aid in the diagnosis of influenza from Nasopharyngeal swab specimens and should not be used as a sole basis for treatment. Nasal washings and aspirates are unacceptable for Xpert Xpress SARS-CoV-2/FLU/RSV testing.  Fact Sheet for Patients: BloggerCourse.com  Fact Sheet for Healthcare Providers: SeriousBroker.it  This test is not yet approved or cleared by the [place]  FDA and has been authorized for detection and/or diagnosis of SARS-CoV-2 by FDA under an Emergency Use Authorization (EUA). This EUA will remain in effect (meaning this test can be used) for the duration of the COVID-19 declaration under Section 564(b)(1) of the Act, 21 U.S.C. section 360bbb-3(b)(1), unless the authorization is terminated or revoked.     Resp Syncytial Virus by PCR NEGATIVE NEGATIVE Final     Comment: (NOTE) Fact Sheet for Patients: BloggerCourse.com  Fact Sheet for Healthcare Providers: SeriousBroker.it  This test is not yet approved or cleared by the [place]  FDA and has been authorized for detection and/or diagnosis of SARS-CoV-2 by FDA under an Emergency Use Authorization (EUA). This EUA will remain in effect (meaning this test can be used) for the duration of the COVID-19 declaration under Section 564(b)(1) of the Act, 21 U.S.C. section 360bbb-3(b)(1), unless the authorization is terminated or revoked.  Performed at [facility], [address] [place], [place] [address]   Blood culture (routine x 2)     Status: None (Preliminary result)   Collection Time: 06/15/24  6:07 PM   Specimen: BLOOD  Result Value Ref Range Status   Specimen Description BLOOD RIGHT ANTECUBITAL  Final   Special Requests   Final    BOTTLES DRAWN AEROBIC AND ANAEROBIC Blood Culture adequate volume   Culture   Final    NO GROWTH 2 DAYS Performed at [facility], [address] [place], [place] [address]    Report Status PENDING  Incomplete  Blood culture (routine x 2)     Status: None (Preliminary result)   Collection Time: 06/15/24  6:15 PM   Specimen: BLOOD  Result Value Ref Range Status   Specimen Description BLOOD LEFT ANTECUBITAL  Final   Special Requests   Final    BOTTLES DRAWN AEROBIC AND ANAEROBIC Blood Culture adequate volume   Culture   Final    NO GROWTH 2 DAYS Performed at [facility], [address], [place], [place] [address]    Report Status PENDING  Incomplete    Procedures and diagnostic studies:  CT ABDOMEN PELVIS W CONTRAST Result Date: 06/15/2024 CLINICAL  DATA:  Stage IV colon cancer. Abdominal pain, back pain and nausea and vomiting. * Tracking Code: BO * EXAM: CT ANGIOGRAPHY CHEST CT ABDOMEN AND PELVIS WITH CONTRAST TECHNIQUE: Multidetector CT imaging of the chest was  performed using the standard protocol during bolus administration of intravenous contrast. Multiplanar CT image reconstructions and MIPs were obtained to evaluate the vascular anatomy. Multidetector CT imaging of the abdomen and pelvis was performed using the standard protocol during bolus administration of intravenous contrast. RADIATION DOSE REDUCTION: This exam was performed according to the departmental dose-optimization program which includes automated exposure control, adjustment of the mA and/or kV according to patient size and/or use of iterative reconstruction technique. CONTRAST:  75mL OMNIPAQUE  IOHEXOL  350 MG/ML SOLN COMPARISON:  PET 04/22/2024. FINDINGS: CTA CHEST FINDINGS Cardiovascular: Negative for pulmonary embolus. Right IJ Port-A-Cath terminates in the right atrium. Atherosclerotic calcification of the aorta, aortic valve and coronary arteries. Enlarged pulmonic trunk and heart. No pericardial effusion. Mediastinum/Nodes: No pathologically enlarged mediastinal, hilar or axillary lymph nodes. Esophagus is grossly unremarkable. Lungs/Pleura: Centrilobular and paraseptal emphysema. Vague ground-glass in the lingula (6/82), likely scarring, unchanged from 04/22/2024. Scarring in the superior segment left lower lobe. No pleural fluid. Airway is unremarkable. Musculoskeletal: Healing or healed pathologic fracture of the posterior left fifth rib, as before. Old right prick rib fractures. Review of the MIP images confirms the above findings. CT ABDOMEN and PELVIS FINDINGS Hepatobiliary: Small cysts in the inferior right hepatic lobe. No specific follow-up necessary. Gallstone. No biliary ductal dilatation. Pancreas: Negative. Spleen: Negative. Adrenals/Urinary Tract: Slight thickening of the medial limb right adrenal gland. Heterogeneous left adrenal mass measures 2.5 x 3.7 cm, previously characterized as a myelolipoma. No specific follow-up necessary. Small low-attenuation lesion in the right kidney. No  specific follow-up necessary. Kidneys are otherwise unremarkable. Ureters are decompressed. Bladder is grossly unremarkable. Stomach/Bowel: Postoperative changes in the mid and distal stomach with antral wall thickening and irregularity, hypermetabolic on PET 04/22/2024. Associated outpouching along the inferior margin of the gastric antrum contains fluid and air (2/33). Extended right hemicolectomy with an enterocolonic anastomosis in the left mid abdomen (2/32). Separate small bowel anastomosis in the high left anatomic pelvis with a large collection somewhat thick-walled fluid and air along the medial margin of the enterocolonic anastomosis, measuring 4.5 x 5.3 cm (2/46). 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 Vascular/Lymphatic: Atherosclerotic calcification of the aorta. 10 mm left periaortic lymph node (2/32), stable. Similar small to borderline enlarged periportal lymph nodes. Reproductive: No adnexal mass. Other: Scattered ascites. No free air. Mesh repair along the ventral abdominal wall. Musculoskeletal: Degenerative changes in the spine. Review of the MIP images confirms the above findings. IMPRESSION: 1. Negative for pulmonary embolus. 2. Partial gastrectomy with fold thickening, irregularity and an outpouching along the anastomotic margin, worrisome for perforation/abscess and possible disease recurrence. 3. Small bowel resection and extended right hemicolectomy with an extraluminal collection of fluid and air associated with an enteric anastomosis in the high left anatomic pelvis, suggestive of an anastomotic leak and abscess. Again, disease recurrence may be the underlying etiology. 4. 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 5. Healing or healed pathologic fracture of the posterior left fifth rib, as discussed on PET 04/22/2024. 6. Cholelithiasis. 7. Left adrenal mass, previously characterized as a myelolipoma. 8. Scattered ascites. 9. Aortic  atherosclerosis (ICD10-I70.0). Coronary artery calcification. 10. Enlarged pulmonic trunk, indicative of pulmonary arterial hypertension. 11.  Emphysema (ICD10-J43.9). Electronically Signed   By: Newell Eke M.D.   On: 06/15/2024  16:04   CT Angio Chest PE W/Cm &/Or Wo Cm Result Date: 06/15/2024 CLINICAL DATA:  Stage IV colon cancer. Abdominal pain, back pain and nausea and vomiting. * Tracking Code: BO * EXAM: CT ANGIOGRAPHY CHEST CT ABDOMEN AND PELVIS WITH CONTRAST TECHNIQUE: Multidetector CT imaging of the chest was performed using the standard protocol during bolus administration of intravenous contrast. Multiplanar CT image reconstructions and MIPs were obtained to evaluate the vascular anatomy. Multidetector CT imaging of the abdomen and pelvis was performed using the standard protocol during bolus administration of intravenous contrast. RADIATION DOSE REDUCTION: This exam was performed according to the departmental dose-optimization program which includes automated exposure control, adjustment of the mA and/or kV according to patient size and/or use of iterative reconstruction technique. CONTRAST:  75mL OMNIPAQUE  IOHEXOL  350 MG/ML SOLN COMPARISON:  PET 04/22/2024. FINDINGS: CTA CHEST FINDINGS Cardiovascular: Negative for pulmonary embolus. Right IJ Port-A-Cath terminates in the right atrium. Atherosclerotic calcification of the aorta, aortic valve and coronary arteries. Enlarged pulmonic trunk and heart. No pericardial effusion. Mediastinum/Nodes: No pathologically enlarged mediastinal, hilar or axillary lymph nodes. Esophagus is grossly unremarkable. Lungs/Pleura: Centrilobular and paraseptal emphysema. Vague ground-glass in the lingula (6/82), likely scarring, unchanged from 04/22/2024. Scarring in the superior segment left lower lobe. No pleural fluid. Airway is unremarkable. Musculoskeletal: Healing or healed pathologic fracture of the posterior left fifth rib, as before. Old right prick rib  fractures. Review of the MIP images confirms the above findings. CT ABDOMEN and PELVIS FINDINGS Hepatobiliary: Small cysts in the inferior right hepatic lobe. No specific follow-up necessary. Gallstone. No biliary ductal dilatation. Pancreas: Negative. Spleen: Negative. Adrenals/Urinary Tract: Slight thickening of the medial limb right adrenal gland. Heterogeneous left adrenal mass measures 2.5 x 3.7 cm, previously characterized as a myelolipoma. No specific follow-up necessary. Small low-attenuation lesion in the right kidney. No specific follow-up necessary. Kidneys are otherwise unremarkable. Ureters are decompressed. Bladder is grossly unremarkable. Stomach/Bowel: Postoperative changes in the mid and distal stomach with antral wall thickening and irregularity, hypermetabolic on PET 04/22/2024. Associated outpouching along the inferior margin of the gastric antrum contains fluid and air (2/33). Extended right hemicolectomy with an enterocolonic anastomosis in the left mid abdomen (2/32). Separate small bowel anastomosis in the high left anatomic pelvis with a large collection somewhat thick-walled fluid and air along the medial margin of the enterocolonic anastomosis, measuring 4.5 x 5.3 cm (2/46). 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 Vascular/Lymphatic: Atherosclerotic calcification of the aorta. 10 mm left periaortic lymph node (2/32), stable. Similar small to borderline enlarged periportal lymph nodes. Reproductive: No adnexal mass. Other: Scattered ascites. No free air. Mesh repair along the ventral abdominal wall. Musculoskeletal: Degenerative changes in the spine. Review of the MIP images confirms the above findings. IMPRESSION: 1. Negative for pulmonary embolus. 2. Partial gastrectomy with fold thickening, irregularity and an outpouching along the anastomotic margin, worrisome for perforation/abscess and possible disease recurrence. 3. Small bowel resection and extended  right hemicolectomy with an extraluminal collection of fluid and air associated with an enteric anastomosis in the high left anatomic pelvis, suggestive of an anastomotic leak and abscess. Again, disease recurrence may be the underlying etiology. 4. 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 5. Healing or healed pathologic fracture of the posterior left fifth rib, as discussed on PET 04/22/2024. 6. Cholelithiasis. 7. Left adrenal mass, previously characterized as a myelolipoma. 8. Scattered ascites. 9. Aortic atherosclerosis (ICD10-I70.0). Coronary artery calcification. 10. Enlarged pulmonic trunk, indicative of pulmonary arterial hypertension. 11.  Emphysema (  ICD10-J43.9). Electronically Signed   By: Newell Eke M.D.   On: 06/15/2024 16:04               LOS: 2 days   Patric Buckhalter  [facility]   Pager on [email or web address]. If 7PM-7AM, please contact night-coverage at [email or web address]     06/17/2024, 1:50 PM

## 2024-06-17 NOTE — Care Management Important Message (Signed)
 Important Message  Patient Details  Name: Jessica Stout MRN: [id number] Date of Birth: 01/10/1951   Important Message Given:  Yes - Medicare IM     [name] 06/17/2024, 12:09 PM

## 2024-06-18 DIAGNOSIS — R109 Unspecified abdominal pain: Secondary | ICD-10-CM | POA: Diagnosis not present

## 2024-06-18 DIAGNOSIS — K651 Peritoneal abscess: Secondary | ICD-10-CM | POA: Diagnosis not present

## 2024-06-18 LAB — GLUCOSE, CAPILLARY
Glucose-Capillary: 115 mg/dL — ABNORMAL HIGH (ref 70–99)
Glucose-Capillary: 127 mg/dL — ABNORMAL HIGH (ref 70–99)

## 2024-06-18 MED ORDER — NALOXONE HCL 4 MG/0.1ML NA LIQD: NASAL | 0 refills | Status: DC

## 2024-06-18 MED ORDER — SENNOSIDES-DOCUSATE SODIUM 8.6-50 MG PO TABS: 1.0000 | ORAL_TABLET | Freq: Two times a day (BID) | ORAL | Status: DC

## 2024-06-18 MED ORDER — AMOXICILLIN-POT CLAVULANATE 875-125 MG PO TABS
1.0000 | ORAL_TABLET | Freq: Two times a day (BID) | ORAL | 0 refills | Status: AC
Start: 2024-06-18 — End: 2024-06-22

## 2024-06-18 MED ORDER — OXYCODONE HCL ER 10 MG PO T12A: 10.0000 mg | EXTENDED_RELEASE_TABLET | Freq: Two times a day (BID) | ORAL | 0 refills | Status: DC

## 2024-06-18 MED ORDER — OXYCODONE HCL 5 MG PO TABS: 5.0000 mg | ORAL_TABLET | Freq: Three times a day (TID) | ORAL | Status: DC | PRN

## 2024-06-18 MED ORDER — HEPARIN SOD (PORK) LOCK FLUSH 100 UNIT/ML IV SOLN
500.0000 [IU] | Freq: Once | INTRAVENOUS | Status: AC
  Administered 2024-06-18: 500 [IU] via INTRAVENOUS
  Filled 2024-06-18: qty 5

## 2024-06-18 NOTE — Discharge Summary (Signed)
 Physician Discharge Summary   Patient: Jessica Stout MRN: [id number] DOB: 1951-02-24  Admit date:     06/15/2024  Discharge date: 06/18/24  Discharge Physician: AIDA CHO   PCP: Corlis Honor BROCKS, MD   Recommendations at discharge:   Follow-up with PCP in 1 week Outpatient follow-up with Dr. Jacobo, oncologist and palliative care team  Discharge Diagnoses: Principal Problem:   Abdominal pain Active Problems:   Intra-abdominal abscess (HCC)   Colon cancer (HCC)   Chronic systolic CHF (congestive heart failure) (HCC)   Hyperlipidemia   HTN (hypertension)   Chronic obstructive pulmonary disease (HCC)   Diabetes mellitus without complication (HCC)   Iron  deficiency anemia   Chest pain   Anxiety and depression   Hypomagnesemia   Abnormal abdominal CT scan   Nausea vomiting and diarrhea  Resolved Problems:   * No resolved hospital problems. *  Hospital Course:   Jessica Stout is a 73 y.o. female with medical history significant of colon cancer s/p of chemotherapy, s/p of partial gastrotomy, s/p of right hemicolectomy), HTN, HLD, DM, COPD/asthma, CHF with midrange EF 45-50%, stroke, depression with anxiety, anemia, who presented to the hospital with nausea, vomiting and abdominal pain.   She said that she has worsening abdominal pain for about 4 weeks, associated with nausea and intermittent vomiting with nonbilious nonbloody vomiting.  She has chronic diarrhea at baseline which has not changed much.  She had some central chest pain, mild shortness of breath and cough with clear mucus production.  She was seen at the cancer clinic the day of admission and was given 4 g of morphine  sulfate and 8 mg of Zofran .   Significant labs: WBC 12.8, hemoglobin 7.6, magnesium  1.4, albumin 2.9.   CT abdomen and pelvis   1. Negative for pulmonary embolus. 2. Partial gastrectomy with fold thickening, irregularity and an outpouching along the anastomotic margin, worrisome  for perforation/abscess and possible disease recurrence. 3. Small bowel resection and extended right hemicolectomy with an extraluminal collection of fluid and air associated with an enteric anastomosis in the high left anatomic pelvis, suggestive of an anastomotic leak and abscess. Again, disease recurrence may be the underlying etiology. 4. 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 5. Healing or healed pathologic fracture of the posterior left fifth rib, as discussed on PET 04/22/2024. 6. Cholelithiasis. 7. Left adrenal mass, previously characterized as a myelolipoma. 8. Scattered ascites. 9. Aortic atherosclerosis (ICD10-I70.0). Coronary artery calcification. 10. Enlarged pulmonic trunk, indicative of pulmonary arterial hypertension. 11.  Emphysema (ICD10-J43.9).    Assessment and Plan:   Abdominal pain due to intra-abdominal abscess [facility]):  Improved.  S/p treatment with IV Zosyn . She will be discharged on Augmentin  for 4 days to complete 7 days of treatment. He was seen by Dr. Lane, general surgeon, who signed off the case because there is no indication for surgery.     Stage III colon cancer [facility]): Patient is currently not on chemotherapy. Patient was evaluated by Sidra, NP, palliative care team. She has been started on OxyContin  10 mg every 12 hours. Patient has been advised to minimize the use of oxycodone  as needed (dose changed from every 4 hours as needed to every 8 hours as needed). She has been educated on potential adverse effects of opioids especially when combined with benzodiazepines.   She was given a prescription for Narcan  spray to be used as needed for accidental overdose.  This was discussed with her husband at the bedside as well    Acute  on chronic anemia, iron  deficiency anemia:  Hemoglobin up from 7 to 7.7 S/p transfusion with 1 unit of PRBCs on 06/16/2024. Outpatient follow-up with CBC and transfuse as needed     Hypomagnesemia: Improved     Chronic CHF with midrange ejection fraction (congestive heart failure) (HCC): Compensated. 2D echo on 12/25/2022 showed EF of 45 to 50% with grade 1 diastolic dysfunction.       Chronic obstructive pulmonary disease (HCC): Stable -Bronchodilators and as needed Mucinex      Diabetes mellitus without complication (HCC): Recent A1c 7.0.  Low sugar diet recommended     Chest pain:  CTA negative for PE.  Troponins negative - Continue home Ranexa      Comorbidities include anxiety, depression, hyperlipidemia, hypertension, anxiety and depression    His condition has improved and he is deemed stable for discharge to home today       Pain control - [place]  Controlled Substance Reporting System database was reviewed. and patient was instructed, not to drive, operate heavy machinery, perform activities at heights, swimming or participation in water activities or provide baby-sitting services while on Pain, Sleep and Anxiety Medications; until their outpatient Physician has advised to do so again. Also recommended to not to take more than prescribed Pain, Sleep and Anxiety Medications.  Consultants: Development worker, international aid, palliative care Procedures performed: None Disposition: Home Diet recommendation:  Discharge Diet Orders (From admission, onward)     Start     Ordered   06/18/24 0000  Diet - low sodium heart healthy        06/18/24 0957           Cardiac and Carb modified diet DISCHARGE MEDICATION: Allergies as of 06/18/2024       Reactions   Codeine Rash, Dermatitis   Happened in 1970s        Medication List     STOP taking these medications    insulin  lispro 100 UNIT/ML injection Commonly known as: HUMALOG   Lantus  SoloStar 100 UNIT/ML Solostar Pen Generic drug: insulin  glargine   Magnesium  Citrate 200 MG Tabs   megestrol  40 MG tablet Commonly known as: MEGACE    metoCLOPramide  10 MG tablet Commonly known as: REGLAN     traMADol 50 MG tablet Commonly known as: ULTRAM       TAKE these medications    acetaminophen  325 MG tablet Commonly known as: TYLENOL  Take 650 mg by mouth every 6 (six) hours as needed.   albuterol  108 (90 Base) MCG/ACT inhaler Commonly known as: VENTOLIN  HFA Inhale 1-2 puffs into the lungs every 4 (four) hours as needed for wheezing or shortness of breath. Inhale into the lungs.   amoxicillin -clavulanate 875-125 MG tablet Commonly known as: AUGMENTIN  Take 1 tablet by mouth 2 (two) times daily for 4 days.   atorvastatin  40 MG tablet Commonly known as: LIPITOR Take 40 mg by mouth daily.   clonazePAM  1 MG tablet Commonly known as: KLONOPIN  Take 1 mg by mouth 2 (two) times daily.   fenofibrate  54 MG tablet Take 54 mg by mouth daily.   gabapentin  600 MG tablet Commonly known as: NEURONTIN  Take by mouth 2 (two) times daily.   lisinopril 20 MG tablet Commonly known as: ZESTRIL Take 20 mg by mouth daily.   metoprolol  succinate 50 MG 24 hr tablet Commonly known as: TOPROL -XL Take 50 mg by mouth daily. Take with or immediately following a meal.   naloxone  4 MG/0.1ML Liqd nasal spray kit Commonly known as: NARCAN  Use as needed for accidental  opioid overdose and call EMS   OLANZapine  10 MG tablet Commonly known as: ZYPREXA  Take 0.5-1 tablets (5-10 mg total) by mouth at bedtime as needed (nausea).   ondansetron  8 MG tablet Commonly known as: ZOFRAN  Take 1 tablet (8 mg total) by mouth every 8 (eight) hours as needed for nausea or vomiting.   oxyCODONE  5 MG immediate release tablet Commonly known as: Oxy IR/ROXICODONE  Take 1 tablet (5 mg total) by mouth every 8 (eight) hours as needed for breakthrough pain. What changed:  when to take this reasons to take this   oxyCODONE  10 mg 12 hr tablet Commonly known as: OXYCONTIN  Take 1 tablet (10 mg total) by mouth every 12 (twelve) hours. What changed: You were already taking a medication with the same name, and this  prescription was added. Make sure you understand how and when to take each.   pantoprazole  40 MG tablet Commonly known as: Protonix  Take 1 tablet (40 mg total) by mouth daily.   ranolazine  500 MG 12 hr tablet Commonly known as: RANEXA  Take 500 mg by mouth 2 (two) times daily.   senna-docusate 8.6-50 MG tablet Commonly known as: Senokot-S Take 1 tablet by mouth 2 (two) times daily.   sitaGLIPtin-metformin 50-1000 MG tablet Commonly known as: JANUMET Take 1 tablet by mouth daily.   venlafaxine  XR 75 MG 24 hr capsule Commonly known as: EFFEXOR -XR Take 75 mg by mouth daily.        Follow-up Information     Corlis Honor BROCKS, MD. Call today.   Specialty: Internal Medicine Why: To follow up Contact information: [address]   [place] [address] [phone]                Discharge Exam: Jessica Stout   06/15/24 1309 06/17/24 0500 06/18/24 0500  Weight: 52.6 kg 56.2 kg 52 kg   GEN: NAD SKIN: Warm and dry EYES: No pallor or icterus ENT: MMM CV: RRR PULM: CTA B ABD: soft, ND, mild mid abdominal tenderness, no rebound tenderness or guarding, +BS CNS: AAO x 3, non focal EXT: No edema or tenderness   Condition at discharge: good  The results of significant diagnostics from this hospitalization (including imaging, microbiology, ancillary and laboratory) are listed below for reference.   Imaging Studies: CT ABDOMEN PELVIS W CONTRAST Result Date: 06/15/2024 CLINICAL DATA:  Stage IV colon cancer. Abdominal pain, back pain and nausea and vomiting. * Tracking Code: BO * EXAM: CT ANGIOGRAPHY CHEST CT ABDOMEN AND PELVIS WITH CONTRAST TECHNIQUE: Multidetector CT imaging of the chest was performed using the standard protocol during bolus administration of intravenous contrast. Multiplanar CT image reconstructions and MIPs were obtained to evaluate the vascular anatomy. Multidetector CT imaging of the abdomen and pelvis was performed using the standard protocol  during bolus administration of intravenous contrast. RADIATION DOSE REDUCTION: This exam was performed according to the departmental dose-optimization program which includes automated exposure control, adjustment of the mA and/or kV according to patient size and/or use of iterative reconstruction technique. CONTRAST:  75mL OMNIPAQUE  IOHEXOL  350 MG/ML SOLN COMPARISON:  PET 04/22/2024. FINDINGS: CTA CHEST FINDINGS Cardiovascular: Negative for pulmonary embolus. Right IJ Port-A-Cath terminates in the right atrium. Atherosclerotic calcification of the aorta, aortic valve and coronary arteries. Enlarged pulmonic trunk and heart. No pericardial effusion. Mediastinum/Nodes: No pathologically enlarged mediastinal, hilar or axillary lymph nodes. Esophagus is grossly unremarkable. Lungs/Pleura: Centrilobular and paraseptal emphysema. Vague ground-glass in the lingula (6/82), likely scarring, unchanged from 04/22/2024. Scarring in the superior segment  left lower lobe. No pleural fluid. Airway is unremarkable. Musculoskeletal: Healing or healed pathologic fracture of the posterior left fifth rib, as before. Old right prick rib fractures. Review of the MIP images confirms the above findings. CT ABDOMEN and PELVIS FINDINGS Hepatobiliary: Small cysts in the inferior right hepatic lobe. No specific follow-up necessary. Gallstone. No biliary ductal dilatation. Pancreas: Negative. Spleen: Negative. Adrenals/Urinary Tract: Slight thickening of the medial limb right adrenal gland. Heterogeneous left adrenal mass measures 2.5 x 3.7 cm, previously characterized as a myelolipoma. No specific follow-up necessary. Small low-attenuation lesion in the right kidney. No specific follow-up necessary. Kidneys are otherwise unremarkable. Ureters are decompressed. Bladder is grossly unremarkable. Stomach/Bowel: Postoperative changes in the mid and distal stomach with antral wall thickening and irregularity, hypermetabolic on PET 04/22/2024.  Associated outpouching along the inferior margin of the gastric antrum contains fluid and air (2/33). Extended right hemicolectomy with an enterocolonic anastomosis in the left mid abdomen (2/32). Separate small bowel anastomosis in the high left anatomic pelvis with a large collection somewhat thick-walled fluid and air along the medial margin of the enterocolonic anastomosis, measuring 4.5 x 5.3 cm (2/46). 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 Vascular/Lymphatic: Atherosclerotic calcification of the aorta. 10 mm left periaortic lymph node (2/32), stable. Similar small to borderline enlarged periportal lymph nodes. Reproductive: No adnexal mass. Other: Scattered ascites. No free air. Mesh repair along the ventral abdominal wall. Musculoskeletal: Degenerative changes in the spine. Review of the MIP images confirms the above findings. IMPRESSION: 1. Negative for pulmonary embolus. 2. Partial gastrectomy with fold thickening, irregularity and an outpouching along the anastomotic margin, worrisome for perforation/abscess and possible disease recurrence. 3. Small bowel resection and extended right hemicolectomy with an extraluminal collection of fluid and air associated with an enteric anastomosis in the high left anatomic pelvis, suggestive of an anastomotic leak and abscess. Again, disease recurrence may be the underlying etiology. 4. 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 5. Healing or healed pathologic fracture of the posterior left fifth rib, as discussed on PET 04/22/2024. 6. Cholelithiasis. 7. Left adrenal mass, previously characterized as a myelolipoma. 8. Scattered ascites. 9. Aortic atherosclerosis (ICD10-I70.0). Coronary artery calcification. 10. Enlarged pulmonic trunk, indicative of pulmonary arterial hypertension. 11.  Emphysema (ICD10-J43.9). Electronically Signed   By: Newell Eke M.D.   On: 06/15/2024 16:04   CT Angio Chest PE W/Cm &/Or Wo  Cm Result Date: 06/15/2024 CLINICAL DATA:  Stage IV colon cancer. Abdominal pain, back pain and nausea and vomiting. * Tracking Code: BO * EXAM: CT ANGIOGRAPHY CHEST CT ABDOMEN AND PELVIS WITH CONTRAST TECHNIQUE: Multidetector CT imaging of the chest was performed using the standard protocol during bolus administration of intravenous contrast. Multiplanar CT image reconstructions and MIPs were obtained to evaluate the vascular anatomy. Multidetector CT imaging of the abdomen and pelvis was performed using the standard protocol during bolus administration of intravenous contrast. RADIATION DOSE REDUCTION: This exam was performed according to the departmental dose-optimization program which includes automated exposure control, adjustment of the mA and/or kV according to patient size and/or use of iterative reconstruction technique. CONTRAST:  75mL OMNIPAQUE  IOHEXOL  350 MG/ML SOLN COMPARISON:  PET 04/22/2024. FINDINGS: CTA CHEST FINDINGS Cardiovascular: Negative for pulmonary embolus. Right IJ Port-A-Cath terminates in the right atrium. Atherosclerotic calcification of the aorta, aortic valve and coronary arteries. Enlarged pulmonic trunk and heart. No pericardial effusion. Mediastinum/Nodes: No pathologically enlarged mediastinal, hilar or axillary lymph nodes. Esophagus is grossly unremarkable. Lungs/Pleura: Centrilobular and paraseptal emphysema. Vague ground-glass  in the lingula (6/82), likely scarring, unchanged from 04/22/2024. Scarring in the superior segment left lower lobe. No pleural fluid. Airway is unremarkable. Musculoskeletal: Healing or healed pathologic fracture of the posterior left fifth rib, as before. Old right prick rib fractures. Review of the MIP images confirms the above findings. CT ABDOMEN and PELVIS FINDINGS Hepatobiliary: Small cysts in the inferior right hepatic lobe. No specific follow-up necessary. Gallstone. No biliary ductal dilatation. Pancreas: Negative. Spleen: Negative.  Adrenals/Urinary Tract: Slight thickening of the medial limb right adrenal gland. Heterogeneous left adrenal mass measures 2.5 x 3.7 cm, previously characterized as a myelolipoma. No specific follow-up necessary. Small low-attenuation lesion in the right kidney. No specific follow-up necessary. Kidneys are otherwise unremarkable. Ureters are decompressed. Bladder is grossly unremarkable. Stomach/Bowel: Postoperative changes in the mid and distal stomach with antral wall thickening and irregularity, hypermetabolic on PET 04/22/2024. Associated outpouching along the inferior margin of the gastric antrum contains fluid and air (2/33). Extended right hemicolectomy with an enterocolonic anastomosis in the left mid abdomen (2/32). Separate small bowel anastomosis in the high left anatomic pelvis with a large collection somewhat thick-walled fluid and air along the medial margin of the enterocolonic anastomosis, measuring 4.5 x 5.3 cm (2/46). 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 Vascular/Lymphatic: Atherosclerotic calcification of the aorta. 10 mm left periaortic lymph node (2/32), stable. Similar small to borderline enlarged periportal lymph nodes. Reproductive: No adnexal mass. Other: Scattered ascites. No free air. Mesh repair along the ventral abdominal wall. Musculoskeletal: Degenerative changes in the spine. Review of the MIP images confirms the above findings. IMPRESSION: 1. Negative for pulmonary embolus. 2. Partial gastrectomy with fold thickening, irregularity and an outpouching along the anastomotic margin, worrisome for perforation/abscess and possible disease recurrence. 3. Small bowel resection and extended right hemicolectomy with an extraluminal collection of fluid and air associated with an enteric anastomosis in the high left anatomic pelvis, suggestive of an anastomotic leak and abscess. Again, disease recurrence may be the underlying etiology. 4. Probable enterocolonic  fistula in the left anatomic pelvis. 5. Healing or healed pathologic fracture of the posterior left fifth rib, as discussed on PET 04/22/2024. 6. Cholelithiasis. 7. Left adrenal mass, previously characterized as a myelolipoma. 8. Scattered ascites. 9. Aortic atherosclerosis (ICD10-I70.0). Coronary artery calcification. 10. Enlarged pulmonic trunk, indicative of pulmonary arterial hypertension. 11.  Emphysema (ICD10-J43.9). Electronically Signed   By: Newell Eke M.D.   On: 06/15/2024 16:04    Microbiology: Results for orders placed or performed during the hospital encounter of 06/15/24  Resp panel by RT-PCR (RSV, Flu A&B, Covid) Anterior Nasal Swab     Status: None   Collection Time: 06/15/24  1:21 PM   Specimen: Anterior Nasal Swab  Result Value Ref Range Status   SARS Coronavirus 2 by RT PCR NEGATIVE NEGATIVE Final    Comment: (NOTE) SARS-CoV-2 target nucleic acids are NOT DETECTED.  The SARS-CoV-2 RNA is generally detectable in upper respiratory specimens during the acute phase of infection. The lowest concentration of SARS-CoV-2 viral copies this assay can detect is 138 copies/mL. A negative result does not preclude SARS-Cov-2 infection and should not be used as the sole basis for treatment or other patient management decisions. A negative result may occur with  improper specimen collection/handling, submission of specimen other than nasopharyngeal swab, presence of viral mutation(s) within the areas targeted by this assay, and inadequate number of viral copies(<138 copies/mL). A negative result must be combined with clinical observations, patient history, and epidemiological information. The expected  result is Negative.  Fact Sheet for Patients:  BloggerCourse.com  Fact Sheet for Healthcare Providers:  SeriousBroker.it  This test is no t yet approved or cleared by the [place]  FDA and  has been authorized for detection  and/or diagnosis of SARS-CoV-2 by FDA under an Emergency Use Authorization (EUA). This EUA will remain  in effect (meaning this test can be used) for the duration of the COVID-19 declaration under Section 564(b)(1) of the Act, 21 U.S.C.section 360bbb-3(b)(1), unless the authorization is terminated  or revoked sooner.       Influenza A by PCR NEGATIVE NEGATIVE Final   Influenza B by PCR NEGATIVE NEGATIVE Final    Comment: (NOTE) The Xpert Xpress SARS-CoV-2/FLU/RSV plus assay is intended as an aid in the diagnosis of influenza from Nasopharyngeal swab specimens and should not be used as a sole basis for treatment. Nasal washings and aspirates are unacceptable for Xpert Xpress SARS-CoV-2/FLU/RSV testing.  Fact Sheet for Patients: BloggerCourse.com  Fact Sheet for Healthcare Providers: SeriousBroker.it  This test is not yet approved or cleared by the [place]  FDA and has been authorized for detection and/or diagnosis of SARS-CoV-2 by FDA under an Emergency Use Authorization (EUA). This EUA will remain in effect (meaning this test can be used) for the duration of the COVID-19 declaration under Section 564(b)(1) of the Act, 21 U.S.C. section 360bbb-3(b)(1), unless the authorization is terminated or revoked.     Resp Syncytial Virus by PCR NEGATIVE NEGATIVE Final    Comment: (NOTE) Fact Sheet for Patients: BloggerCourse.com  Fact Sheet for Healthcare Providers: SeriousBroker.it  This test is not yet approved or cleared by the [place]  FDA and has been authorized for detection and/or diagnosis of SARS-CoV-2 by FDA under an Emergency Use Authorization (EUA). This EUA will remain in effect (meaning this test can be used) for the duration of the COVID-19 declaration under Section 564(b)(1) of the Act, 21 U.S.C. section 360bbb-3(b)(1), unless the authorization is terminated  or revoked.  Performed at [facility], [address] [place], [place] [address]   Blood culture (routine x 2)     Status: None (Preliminary result)   Collection Time: 06/15/24  6:07 PM   Specimen: BLOOD  Result Value Ref Range Status   Specimen Description BLOOD RIGHT ANTECUBITAL  Final   Special Requests   Final    BOTTLES DRAWN AEROBIC AND ANAEROBIC Blood Culture adequate volume   Culture   Final    NO GROWTH 3 DAYS Performed at [facility], [address] [place], [place] [address]    Report Status PENDING  Incomplete  Blood culture (routine x 2)     Status: None (Preliminary result)   Collection Time: 06/15/24  6:15 PM   Specimen: BLOOD  Result Value Ref Range Status   Specimen Description BLOOD LEFT ANTECUBITAL  Final   Special Requests   Final    BOTTLES DRAWN AEROBIC AND ANAEROBIC Blood Culture adequate volume   Culture   Final    NO GROWTH 3 DAYS Performed at [facility], [address], [place], [place] [address]    Report Status PENDING  Incomplete    Labs: CBC: Recent Labs  Lab 06/15/24 0941 06/15/24 1237 06/16/24 0337 06/17/24 0500  WBC 12.8* 16.0* 11.0* 9.1  NEUTROABS 11.2*  --   --   --   HGB 7.6* 8.0* 7.0* 7.7*  HCT 25.5* 26.9* 23.8* 25.6*  MCV 85.3 85.9 85.9 83.9  PLT 370 450* 339 326  Basic Metabolic Panel: Recent Labs  Lab 06/15/24 0941 06/15/24 1237 06/16/24 0145 06/17/24 0500  NA 134* 138 138 139  K 4.1 4.5 4.2 3.7  CL 101 106 108 108  CO2 24 25 23 23   GLUCOSE 143* 134* 97 106*  BUN 21 20 13 13   CREATININE 0.82 0.88 0.54 0.71  CALCIUM  8.8* 9.0 8.2* 8.0*  MG 1.4*  --  1.2* 1.8  PHOS  --  3.2  --  3.7   Liver Function Tests: Recent Labs  Lab 06/15/24 0941 06/15/24 1237 06/17/24 0500  AST 14* 15  --   ALT 13 12  --   ALKPHOS 45 44  --   BILITOT 0.5 0.5  --   PROT 6.4* 6.6  --   ALBUMIN 2.9* 2.9* 2.1*   CBG: Recent Labs  Lab 06/17/24 0822 06/17/24 1234 06/17/24 1708 06/17/24 2057  06/18/24 0753  GLUCAP 104* 114* 129* 137* 115*    Discharge time spent: greater than 30 minutes.  Signed: AIDA CHO, MD [facility] 06/18/2024

## 2024-06-18 NOTE — Plan of Care (Signed)

## 2024-06-20 LAB — CULTURE, BLOOD (ROUTINE X 2)
Culture: NO GROWTH
Culture: NO GROWTH
Special Requests: ADEQUATE
Special Requests: ADEQUATE

## 2024-06-29 ENCOUNTER — Inpatient Hospital Stay: Admitting: Hospice and Palliative Medicine

## 2024-06-29 ENCOUNTER — Inpatient Hospital Stay: Admitting: Oncology

## 2024-06-29 ENCOUNTER — Other Ambulatory Visit: Payer: Self-pay | Admitting: *Deleted

## 2024-06-29 ENCOUNTER — Inpatient Hospital Stay

## 2024-06-29 ENCOUNTER — Telehealth: Payer: Self-pay | Admitting: Oncology

## 2024-06-29 ENCOUNTER — Telehealth: Payer: Self-pay | Admitting: *Deleted

## 2024-06-29 DIAGNOSIS — R109 Unspecified abdominal pain: Secondary | ICD-10-CM

## 2024-06-29 MED ORDER — OXYCODONE HCL ER 10 MG PO T12A: 10.0000 mg | EXTENDED_RELEASE_TABLET | Freq: Two times a day (BID) | ORAL | 0 refills | Status: DC

## 2024-06-29 NOTE — Telephone Encounter (Signed)
 Pt husband called and wants to reschedule the pt hospital follow up. Your next available 30 minute slot is 8/12. Is it ok to push her out that far? Please advise.

## 2024-07-04 ENCOUNTER — Encounter: Payer: Self-pay | Admitting: Oncology

## 2024-07-04 NOTE — Telephone Encounter (Signed)
 Refill request sent to Dr Jacobo.

## 2024-07-08 ENCOUNTER — Emergency Department

## 2024-07-08 ENCOUNTER — Inpatient Hospital Stay: Admission: EM | Admit: 2024-07-08 | Discharge: 2024-07-12 | DRG: 871 | Disposition: A | Discharge status: Hospice

## 2024-07-08 DIAGNOSIS — E119 Type 2 diabetes mellitus without complications: Secondary | ICD-10-CM | POA: Diagnosis present

## 2024-07-08 DIAGNOSIS — E876 Hypokalemia: Secondary | ICD-10-CM | POA: Diagnosis present

## 2024-07-08 DIAGNOSIS — Z681 Body mass index (BMI) 19 or less, adult: Secondary | ICD-10-CM | POA: Diagnosis not present

## 2024-07-08 DIAGNOSIS — D649 Anemia, unspecified: Secondary | ICD-10-CM | POA: Diagnosis present

## 2024-07-08 DIAGNOSIS — R109 Unspecified abdominal pain: Secondary | ICD-10-CM | POA: Diagnosis not present

## 2024-07-08 DIAGNOSIS — Z789 Other specified health status: Secondary | ICD-10-CM | POA: Diagnosis not present

## 2024-07-08 DIAGNOSIS — Z794 Long term (current) use of insulin: Secondary | ICD-10-CM

## 2024-07-08 DIAGNOSIS — Z833 Family history of diabetes mellitus: Secondary | ICD-10-CM

## 2024-07-08 DIAGNOSIS — R627 Adult failure to thrive: Secondary | ICD-10-CM | POA: Diagnosis present

## 2024-07-08 DIAGNOSIS — G893 Neoplasm related pain (acute) (chronic): Secondary | ICD-10-CM | POA: Diagnosis present

## 2024-07-08 DIAGNOSIS — C189 Malignant neoplasm of colon, unspecified: Secondary | ICD-10-CM | POA: Diagnosis present

## 2024-07-08 DIAGNOSIS — G8929 Other chronic pain: Secondary | ICD-10-CM | POA: Diagnosis not present

## 2024-07-08 DIAGNOSIS — Z515 Encounter for palliative care: Secondary | ICD-10-CM | POA: Diagnosis not present

## 2024-07-08 DIAGNOSIS — F32A Depression, unspecified: Secondary | ICD-10-CM | POA: Diagnosis present

## 2024-07-08 DIAGNOSIS — I110 Hypertensive heart disease with heart failure: Secondary | ICD-10-CM | POA: Diagnosis present

## 2024-07-08 DIAGNOSIS — Z66 Do not resuscitate: Secondary | ICD-10-CM | POA: Diagnosis present

## 2024-07-08 DIAGNOSIS — Z79899 Other long term (current) drug therapy: Secondary | ICD-10-CM

## 2024-07-08 DIAGNOSIS — R54 Age-related physical debility: Secondary | ICD-10-CM | POA: Diagnosis present

## 2024-07-08 DIAGNOSIS — A419 Sepsis, unspecified organism: Principal | ICD-10-CM | POA: Diagnosis present

## 2024-07-08 DIAGNOSIS — K632 Fistula of intestine: Secondary | ICD-10-CM | POA: Diagnosis present

## 2024-07-08 DIAGNOSIS — N179 Acute kidney failure, unspecified: Secondary | ICD-10-CM | POA: Diagnosis present

## 2024-07-08 DIAGNOSIS — F419 Anxiety disorder, unspecified: Secondary | ICD-10-CM | POA: Diagnosis present

## 2024-07-08 DIAGNOSIS — Z8249 Family history of ischemic heart disease and other diseases of the circulatory system: Secondary | ICD-10-CM

## 2024-07-08 DIAGNOSIS — D72829 Elevated white blood cell count, unspecified: Secondary | ICD-10-CM | POA: Insufficient documentation

## 2024-07-08 DIAGNOSIS — J4489 Other specified chronic obstructive pulmonary disease: Secondary | ICD-10-CM | POA: Diagnosis present

## 2024-07-08 DIAGNOSIS — K651 Peritoneal abscess: Secondary | ICD-10-CM | POA: Diagnosis present

## 2024-07-08 DIAGNOSIS — I10 Essential (primary) hypertension: Secondary | ICD-10-CM | POA: Diagnosis present

## 2024-07-08 DIAGNOSIS — R64 Cachexia: Secondary | ICD-10-CM | POA: Diagnosis present

## 2024-07-08 DIAGNOSIS — Z9221 Personal history of antineoplastic chemotherapy: Secondary | ICD-10-CM

## 2024-07-08 DIAGNOSIS — M549 Dorsalgia, unspecified: Secondary | ICD-10-CM | POA: Diagnosis not present

## 2024-07-08 DIAGNOSIS — Z7189 Other specified counseling: Secondary | ICD-10-CM | POA: Diagnosis not present

## 2024-07-08 DIAGNOSIS — E785 Hyperlipidemia, unspecified: Secondary | ICD-10-CM | POA: Diagnosis present

## 2024-07-08 DIAGNOSIS — Z87891 Personal history of nicotine dependence: Secondary | ICD-10-CM

## 2024-07-08 DIAGNOSIS — Z8673 Personal history of transient ischemic attack (TIA), and cerebral infarction without residual deficits: Secondary | ICD-10-CM

## 2024-07-08 DIAGNOSIS — Z800 Family history of malignant neoplasm of digestive organs: Secondary | ICD-10-CM

## 2024-07-08 DIAGNOSIS — Z7984 Long term (current) use of oral hypoglycemic drugs: Secondary | ICD-10-CM | POA: Diagnosis not present

## 2024-07-08 DIAGNOSIS — I5022 Chronic systolic (congestive) heart failure: Secondary | ICD-10-CM | POA: Diagnosis present

## 2024-07-08 DIAGNOSIS — Z903 Acquired absence of stomach [part of]: Secondary | ICD-10-CM

## 2024-07-08 LAB — GLUCOSE, CAPILLARY
Glucose-Capillary: 153 mg/dL — ABNORMAL HIGH (ref 70–99)
Glucose-Capillary: 73 mg/dL (ref 70–99)

## 2024-07-08 LAB — CBC WITH DIFFERENTIAL/PLATELET
Abs Immature Granulocytes: 0.18 K/uL — ABNORMAL HIGH (ref 0.00–0.07)
Basophils Absolute: 0.1 K/uL (ref 0.0–0.1)
Basophils Relative: 0 %
Eosinophils Absolute: 0 K/uL (ref 0.0–0.5)
Eosinophils Relative: 0 %
HCT: 29.6 % — ABNORMAL LOW (ref 36.0–46.0)
Hemoglobin: 9 g/dL — ABNORMAL LOW (ref 12.0–15.0)
Immature Granulocytes: 1 %
Lymphocytes Relative: 5 %
Lymphs Abs: 1.1 K/uL (ref 0.7–4.0)
MCH: 25.4 pg — ABNORMAL LOW (ref 26.0–34.0)
MCHC: 30.4 g/dL (ref 30.0–36.0)
MCV: 83.6 fL (ref 80.0–100.0)
Monocytes Absolute: 0.8 K/uL (ref 0.1–1.0)
Monocytes Relative: 4 %
Neutro Abs: 18 K/uL — ABNORMAL HIGH (ref 1.7–7.7)
Neutrophils Relative %: 90 %
Platelets: 521 K/uL — ABNORMAL HIGH (ref 150–400)
RBC: 3.54 MIL/uL — ABNORMAL LOW (ref 3.87–5.11)
RDW: 17.3 % — ABNORMAL HIGH (ref 11.5–15.5)
WBC: 20.2 K/uL — ABNORMAL HIGH (ref 4.0–10.5)
nRBC: 0 % (ref 0.0–0.2)

## 2024-07-08 LAB — COMPREHENSIVE METABOLIC PANEL WITH GFR
ALT: 13 U/L (ref 0–44)
AST: 19 U/L (ref 15–41)
Albumin: 2.8 g/dL — ABNORMAL LOW (ref 3.5–5.0)
Alkaline Phosphatase: 53 U/L (ref 38–126)
Anion gap: 16 — ABNORMAL HIGH (ref 5–15)
BUN: 25 mg/dL — ABNORMAL HIGH (ref 8–23)
CO2: 22 mmol/L (ref 22–32)
Calcium: 9.1 mg/dL (ref 8.9–10.3)
Chloride: 105 mmol/L (ref 98–111)
Creatinine, Ser: 0.95 mg/dL (ref 0.44–1.00)
GFR, Estimated: >60 mL/min (ref >60)
Glucose, Bld: 180 mg/dL — ABNORMAL HIGH (ref 70–99)
Potassium: 4.4 mmol/L (ref 3.5–5.1)
Sodium: 143 mmol/L (ref 135–145)
Total Bilirubin: 0.7 mg/dL (ref 0.0–1.2)
Total Protein: 6.4 g/dL — ABNORMAL LOW (ref 6.5–8.1)

## 2024-07-08 LAB — MAGNESIUM: Magnesium: 1.4 mg/dL — ABNORMAL LOW (ref 1.7–2.4)

## 2024-07-08 LAB — URINALYSIS, W/ REFLEX TO CULTURE (INFECTION SUSPECTED)
Bacteria, UA: NONE SEEN
Bilirubin Urine: NEGATIVE
Glucose, UA: NEGATIVE mg/dL
Hgb urine dipstick: NEGATIVE
Ketones, ur: NEGATIVE mg/dL
Leukocytes,Ua: NEGATIVE
Nitrite: NEGATIVE
Protein, ur: 100 mg/dL — AB
Specific Gravity, Urine: 1.035 — ABNORMAL HIGH (ref 1.005–1.030)
pH: 6 (ref 5.0–8.0)

## 2024-07-08 LAB — LACTIC ACID, PLASMA
Lactic Acid, Venous: 1.9 mmol/L (ref 0.5–1.9)
Lactic Acid, Venous: 0.8 mmol/L (ref 0.5–1.9)

## 2024-07-08 LAB — PROTIME-INR
INR: 1.1 (ref 0.8–1.2)
Prothrombin Time: 15.2 s (ref 11.4–15.2)

## 2024-07-08 LAB — PHOSPHORUS: Phosphorus: 4.5 mg/dL (ref 2.5–4.6)

## 2024-07-08 MED ORDER — RANOLAZINE ER 500 MG PO TB12
500.0000 mg | ORAL_TABLET | Freq: Two times a day (BID) | ORAL | Status: DC
  Administered 2024-07-08 – 2024-07-12 (×11): 500 mg via ORAL
  Filled 2024-07-08 (×9): qty 1

## 2024-07-08 MED ORDER — INSULIN ASPART 100 UNIT/ML IJ SOLN
0.0000 [IU] | Freq: Every day | INTRAMUSCULAR | Status: DC
  Filled 2024-07-08: qty 1

## 2024-07-08 MED ORDER — ACETAMINOPHEN 325 MG PO TABS
650.0000 mg | ORAL_TABLET | Freq: Four times a day (QID) | ORAL | Status: DC | PRN
  Administered 2024-07-12 (×2): 650 mg via ORAL
  Filled 2024-07-08: qty 2

## 2024-07-08 MED ORDER — VANCOMYCIN HCL IN DEXTROSE 1-5 GM/200ML-% IV SOLN
1000.0000 mg | Freq: Once | INTRAVENOUS | Status: AC
  Administered 2024-07-08: 1000 mg via INTRAVENOUS
  Filled 2024-07-08: qty 200

## 2024-07-08 MED ORDER — CHLORHEXIDINE GLUCONATE CLOTH 2 % EX PADS
6.0000 | MEDICATED_PAD | Freq: Every day | CUTANEOUS | Status: DC
  Administered 2024-07-08 – 2024-07-12 (×7): 6 via TOPICAL

## 2024-07-08 MED ORDER — VANCOMYCIN HCL 750 MG/150ML IV SOLN
750.0000 mg | INTRAVENOUS | Status: DC
  Administered 2024-07-09: 750 mg via INTRAVENOUS
  Filled 2024-07-08 (×2): qty 150

## 2024-07-08 MED ORDER — OXYCODONE HCL ER 10 MG PO T12A
10.0000 mg | EXTENDED_RELEASE_TABLET | Freq: Two times a day (BID) | ORAL | Status: DC
  Administered 2024-07-08 – 2024-07-12 (×12): 10 mg via ORAL
  Filled 2024-07-08 (×9): qty 1

## 2024-07-08 MED ORDER — METOPROLOL TARTRATE 5 MG/5ML IV SOLN
5.0000 mg | INTRAVENOUS | Status: DC | PRN
  Administered 2024-07-08: 5 mg via INTRAVENOUS
  Filled 2024-07-08: qty 5

## 2024-07-08 MED ORDER — IOHEXOL 300 MG/ML  SOLN
100.0000 mL | Freq: Once | INTRAMUSCULAR | Status: AC | PRN
  Administered 2024-07-08: 100 mL via INTRAVENOUS

## 2024-07-08 MED ORDER — MORPHINE SULFATE (PF) 4 MG/ML IV SOLN
4.0000 mg | INTRAVENOUS | Status: AC | PRN
  Administered 2024-07-09: 4 mg via INTRAVENOUS
  Filled 2024-07-08: qty 1

## 2024-07-08 MED ORDER — ACETAMINOPHEN 650 MG RE SUPP: 650.0000 mg | Freq: Four times a day (QID) | RECTAL | Status: DC | PRN

## 2024-07-08 MED ORDER — PIPERACILLIN-TAZOBACTAM 3.375 G IVPB
3.3750 g | Freq: Once | INTRAVENOUS | Status: AC
  Administered 2024-07-08: 3.375 g via INTRAVENOUS
  Filled 2024-07-08: qty 50

## 2024-07-08 MED ORDER — SODIUM CHLORIDE 0.9% FLUSH: 10.0000 mL | INTRAVENOUS | Status: DC | PRN

## 2024-07-08 MED ORDER — FENTANYL CITRATE PF 50 MCG/ML IJ SOSY: 25.0000 ug | PREFILLED_SYRINGE | INTRAMUSCULAR | Status: AC | PRN

## 2024-07-08 MED ORDER — MAGNESIUM SULFATE IN D5W 1-5 GM/100ML-% IV SOLN
1.0000 g | Freq: Once | INTRAVENOUS | Status: AC
  Administered 2024-07-08: 1 g via INTRAVENOUS
  Filled 2024-07-08: qty 100

## 2024-07-08 MED ORDER — HEPARIN SODIUM (PORCINE) 5000 UNIT/ML IJ SOLN
5000.0000 [IU] | Freq: Three times a day (TID) | INTRAMUSCULAR | Status: DC
  Administered 2024-07-08 – 2024-07-12 (×15): 5000 [IU] via SUBCUTANEOUS
  Filled 2024-07-08 (×11): qty 1

## 2024-07-08 MED ORDER — SODIUM CHLORIDE 0.9% FLUSH
10.0000 mL | Freq: Two times a day (BID) | INTRAVENOUS | Status: DC
  Administered 2024-07-08 – 2024-07-11 (×8): 10 mL

## 2024-07-08 MED ORDER — ONDANSETRON HCL 4 MG PO TABS
4.0000 mg | ORAL_TABLET | Freq: Four times a day (QID) | ORAL | Status: DC | PRN
Start: 2024-07-08 — End: 2024-07-12

## 2024-07-08 MED ORDER — SODIUM CHLORIDE 0.9 % IV SOLN: INTRAVENOUS | Status: DC

## 2024-07-08 MED ORDER — ONDANSETRON HCL 4 MG/2ML IJ SOLN
4.0000 mg | Freq: Four times a day (QID) | INTRAMUSCULAR | Status: DC | PRN
  Administered 2024-07-10: 4 mg via INTRAVENOUS
  Filled 2024-07-08: qty 2

## 2024-07-08 MED ORDER — INSULIN ASPART 100 UNIT/ML IJ SOLN
0.0000 [IU] | Freq: Three times a day (TID) | INTRAMUSCULAR | Status: DC
  Administered 2024-07-08: 3 [IU] via SUBCUTANEOUS
  Administered 2024-07-10 (×2): 2 [IU] via SUBCUTANEOUS
  Administered 2024-07-11 (×2): 3 [IU] via SUBCUTANEOUS
  Administered 2024-07-12 (×2): 2 [IU] via SUBCUTANEOUS
  Filled 2024-07-08 (×4): qty 1

## 2024-07-08 MED ORDER — SODIUM CHLORIDE 0.9 % IV BOLUS
500.0000 mL | Freq: Once | INTRAVENOUS | Status: AC
  Administered 2024-07-08: 500 mL via INTRAVENOUS

## 2024-07-08 MED ORDER — HYDRALAZINE HCL 20 MG/ML IJ SOLN: 5.0000 mg | Freq: Four times a day (QID) | INTRAMUSCULAR | Status: DC | PRN

## 2024-07-08 MED ORDER — PIPERACILLIN-TAZOBACTAM 3.375 G IVPB
3.3750 g | Freq: Three times a day (TID) | INTRAVENOUS | Status: DC
  Administered 2024-07-08 – 2024-07-12 (×15): 3.375 g via INTRAVENOUS
  Filled 2024-07-08 (×11): qty 50

## 2024-07-08 MED ORDER — OLANZAPINE 5 MG PO TABS: 5.0000 mg | ORAL_TABLET | Freq: Every evening | ORAL | Status: DC | PRN

## 2024-07-08 NOTE — ED Triage Notes (Signed)
 Pt to ED from home with weakness and abdominal pain since being discharged 3 days ago. Pt's husband states that pt has been too weak to ambulate, less responsive than is baseline, denies CP/SOB. Hx stage 4 Colon Cancer not actively receiving treatment.

## 2024-07-08 NOTE — Assessment & Plan Note (Signed)
 Secondary to infection per above, recheck CBC in a.m.

## 2024-07-08 NOTE — Assessment & Plan Note (Addendum)
 General Surgery has been consulted, appreciate Dr. Lane evaluation recommendation Continue treatment with vancomycin  and Zosyn  per pharmacy Fluid hydration N.p.o.

## 2024-07-08 NOTE — Consult Note (Addendum)
 This unfortunate lady is well known to me from her last hospitalization.   She returns to the hospital with worsening chronic issues c/w further worsening of her metastatic disease, her CT imaging really hasn't changed in a dramatic fashion, and interventions at this point are not expected to produce a significant improvement in her pain, nor her long term outlook.   As before, she is unwilling to consider any surgical intervention, nor IR intervention.   Would continue medical management for tx of infection, and pain control.  No operation would lessen her suffering at this point.   Surgery to sign off.

## 2024-07-08 NOTE — ED Notes (Signed)
 Provided pt with bedpan to urinate in at this time.

## 2024-07-08 NOTE — Hospital Course (Signed)
 Ms. Jessica Stout is a 73 year old female with history of colon cancer status postchemotherapy, partial gastrectomy, right hemicolectomy, hypertension, insulin -dependent diabetes mellitus, hyperlipidemia, CVA, depression, anxiety, COPD/asthma, who presents emergency department for chief concerns of weakness and abdominal pain.  Vitals in the ED showed temperature of 97.5, respiration rate 21, heart rate 127, blood pressure 109/72, SpO2 of 99% on room air.  Serum sodium is 143, potassium 4.4, chloride 105, bicarb 22, BUN of 25, serum creatinine 0.95, eGFR greater than 60, nonfasting blood glucose 180, WBC 20.2, hemoglobin 9.0, platelets of 521.  Lactic acid is 1.9, on repeat is 0.8.  Blood cultures x 2 are in process.  ED treatment: Zosyn , sodium chloride  1 L bolus, vancomycin  per pharmacy.  EDP discussed case with Dr. Lane, general surgery who states he will see the patient.

## 2024-07-08 NOTE — Assessment & Plan Note (Signed)
 Blood cultures x 2 are in process I suspect source is intra-abdominal in setting of enteroenteric and enterocolic fistula with likely abscess Zosyn , vancomycin  per pharmacy Sodium chloride  infusion at 100 mm/h, 1 day ordered

## 2024-07-08 NOTE — Assessment & Plan Note (Signed)
 Secondary to cancer related pain PDMP reviewed Patient takes home OxyContin  10 mg twice a day Morphine  4 mg IV every 4 hours as needed for moderate pain, 20 hours of coverage ordered; fentanyl  25 mcg IV every 4 hours as needed for severe pain, 20 hours of coverage ordered

## 2024-07-08 NOTE — Assessment & Plan Note (Addendum)
 Insulin  SSI with at bedtime ordered

## 2024-07-08 NOTE — Assessment & Plan Note (Signed)
 Zosyn , vancomycin  per pharmacy

## 2024-07-08 NOTE — Consult Note (Signed)
 ED Pharmacy Antibiotic Sign Off An antibiotic consult was received from an ED provider for vancomycin  per pharmacy dosing for sepsis. A chart review was completed to assess appropriateness.   The following one time order(s) were placed:  --Vancomycin  1 g IV  Further antibiotic and/or antibiotic pharmacy consults should be ordered by the admitting provider if indicated.   Thank you for allowing pharmacy to be a part of this patient's care.   Marolyn KATHEE Mare, [facility]  07/08/24 12:28 PM

## 2024-07-08 NOTE — Assessment & Plan Note (Signed)
 Palliative care not consulted at this time as patient and family would like to avoid surgical intervention however is amendable to IR for tube placement for drainage of any abscess

## 2024-07-08 NOTE — H&P (Addendum)
 History and Physical   Jessica Stout FMW:[id number] DOB: 05-17-1951 DOA: 07/08/2024  PCP: Corlis Honor BROCKS, MD  Patient coming from: home via EMS  I have personally briefly reviewed patient's old medical records in [facility] EMR.  Chief Concern: abdominal pain  HPI: Ms. Jessica Stout is a 73 year old female with history of colon cancer status postchemotherapy, partial gastrectomy, right hemicolectomy, hypertension, insulin -dependent diabetes mellitus, hyperlipidemia, CVA, depression, anxiety, COPD/asthma, who presents emergency department for chief concerns of weakness and abdominal pain.  Vitals in the ED showed temperature of 97.5, respiration rate 21, heart rate 127, blood pressure 109/72, SpO2 of 99% on room air.  Serum sodium is 143, potassium 4.4, chloride 105, bicarb 22, BUN of 25, serum creatinine 0.95, eGFR greater than 60, nonfasting blood glucose 180, WBC 20.2, hemoglobin 9.0, platelets of 521.  Lactic acid is 1.9, on repeat is 0.8.  Blood cultures x 2 are in process.  ED treatment: Zosyn , sodium chloride  1 L bolus, vancomycin  per pharmacy.  EDP discussed case with Dr. Lane, general surgery who states he will see the patient. -------------------------------------- At bedside, patient able to tell me her first and last name, age, location.  She reports she has been hurting for a long time.  She reports she has not been able to eat, she endorses weakness. She denies fever, chills, nausea, vomiting.  Patient and spouse at bedside reports they do not want to be operated on.  Abdominal tube is okay.  Social history: She lives at home with her husband.  ROS: Constitutional: no weight change, no fever ENT/Mouth: no sore throat, no rhinorrhea Eyes: no eye pain, no vision changes Cardiovascular: no chest pain, no dyspnea,  no edema, no palpitations Respiratory: no cough, no sputum, no wheezing Gastrointestinal: no nausea, no vomiting, no diarrhea, no constipation Genitourinary:  no urinary incontinence, no dysuria, no hematuria Musculoskeletal: no arthralgias, no myalgias Skin: no skin lesions, no pruritus, Neuro: + weakness, no loss of consciousness, no syncope Psych: no anxiety, no depression, + decrease appetite Heme/Lymph: no bruising, no bleeding  ED Course: Discussed with EDP, for chief concern of intraabdominal infection.  Assessment/Plan  Principal Problem:   Sepsis (HCC) Active Problems:   Abdominal pain   Hyperlipidemia   Anxiety and depression   Intra-abdominal abscess (HCC)   HTN (hypertension)   Colon cancer (HCC)   Diabetes mellitus type 2, controlled, without complications (HCC)   Hypomagnesemia   Leukocytosis   Enterocolic fistula   Failure to thrive in adult   Assessment and Plan:  * Sepsis (HCC) Blood cultures x 2 are in process I suspect source is intra-abdominal in setting of enteroenteric and enterocolic fistula with likely abscess Zosyn , vancomycin  per pharmacy Sodium chloride  infusion at 100 mm/h, 1 day ordered  Abdominal pain Secondary to cancer related pain PDMP reviewed Patient takes home OxyContin  10 mg twice a day Morphine  4 mg IV every 4 hours as needed for moderate pain, 20 hours of coverage ordered; fentanyl  25 mcg IV every 4 hours as needed for severe pain, 20 hours of coverage ordered  HTN (hypertension) Hydralazine  5 mg IV every 6 hours as needed for SBP greater 170, 5 days ordered  Intra-abdominal abscess (HCC) Zosyn , vancomycin  per pharmacy  Failure to thrive in adult Palliative care not consulted at this time as patient and family would like to avoid surgical intervention however is amendable to IR for tube placement for drainage of any abscess  Enterocolic fistula General Surgery has been consulted, appreciate Dr. Lane evaluation recommendation  Continue treatment with vancomycin  and Zosyn  per pharmacy Fluid hydration N.p.o.  Leukocytosis Secondary to infection per above, recheck CBC in  a.m.  Hypomagnesemia Magnesium  was 1.4 on admission Replace with magnesium  1 mg IV one-time dose Recheck magnesium  level in a.m.  Diabetes mellitus type 2, controlled, without complications (HCC) Insulin  SSI with at bedtime ordered  Chart reviewed.   Hospitalization from 06/15/2024 to 06/18/2024: Abdominal pain, intra-abdominal abscess.  Patient was given IV morphine  and IV ondansetron , IV Zosyn .  General surgery was consulted and signed off the case as there is no indication for surgery.  Patient was discharged with Augmentin  for 4 days to complete a 7-day course of treatment.  DVT prophylaxis: Heparin  5000 units subcutaneous every 8 hours Code Status: DNR/DNI  Diet: N.p.o. pending general surgery evaluation Family Communication: Updated sister, Gaetana at bedside with patient's permission.  Updated spouse at bedside with patient's permission. Disposition Plan: Pending clinical course, guarded prognosis Consults called: General Surgery Admission status: PCU, inpatient  Past Medical History:  Diagnosis Date   Anemia    Anxiety    Asthma    Cancer (HCC)    colon   CHF (congestive heart failure) (HCC)    COPD (chronic obstructive pulmonary disease) (HCC)    Diabetes mellitus without complication (HCC)    Hyperlipidemia    Hypertension    Stroke [facility])    Past Surgical History:  Procedure Laterality Date   BIOPSY  06/26/2023   Procedure: BIOPSY;  Surgeon: Maryruth Ole DASEN, MD;  Location: [facility] ENDOSCOPY;  Service: Endoscopy;;   COLONOSCOPY N/A 06/26/2023   Procedure: COLONOSCOPY;  Surgeon: Maryruth Ole DASEN, MD;  Location: [facility] ENDOSCOPY;  Service: Endoscopy;  Laterality: N/A;   COLONOSCOPY WITH PROPOFOL  N/A 02/03/2022   Procedure: COLONOSCOPY WITH PROPOFOL ;  Surgeon: Unk Corinn Skiff, MD;  Location: [facility] ENDOSCOPY;  Service: Gastroenterology;  Laterality: N/A;   ESOPHAGOGASTRODUODENOSCOPY N/A 06/26/2023   Procedure: ESOPHAGOGASTRODUODENOSCOPY (EGD);  Surgeon: Maryruth Ole DASEN, MD;  Location: [facility] ENDOSCOPY;  Service: Endoscopy;  Laterality: N/A;   ESOPHAGOGASTRODUODENOSCOPY (EGD) WITH PROPOFOL  N/A 02/03/2022   Procedure: ESOPHAGOGASTRODUODENOSCOPY (EGD) WITH PROPOFOL ;  Surgeon: Unk Corinn Skiff, MD;  Location: [facility] ENDOSCOPY;  Service: Gastroenterology;  Laterality: N/A;   GIVENS CAPSULE STUDY N/A 02/03/2022   Procedure: GIVENS CAPSULE STUDY;  Surgeon: Unk Corinn Skiff, MD;  Location: [facility] ENDOSCOPY;  Service: Gastroenterology;  Laterality: N/A;  possible Capsule Study   IR IMAGING GUIDED PORT INSERTION  06/18/2022   POLYPECTOMY  06/26/2023   Procedure: POLYPECTOMY;  Surgeon: Maryruth Ole DASEN, MD;  Location: [facility] ENDOSCOPY;  Service: Endoscopy;;   Social History:  reports that she quit smoking about 19 months ago. Her smoking use included e-cigarettes. She does not have any smokeless tobacco history on file. She reports current alcohol use. She reports current drug use. Drug: Marijuana.  Allergies  Allergen Reactions   Codeine Rash and Dermatitis    Happened in 1970s   Family History  Problem Relation Age of Onset   Diabetes Mother    Heart attack Father    Colon cancer Father    Family history: Family history reviewed and not pertinent.  Prior to Admission medications   Medication Sig Start Date End Date Taking? Authorizing Provider  acetaminophen  (TYLENOL ) 325 MG tablet Take 650 mg by mouth every 6 (six) hours as needed.    [provider]  albuterol  (VENTOLIN  HFA) 108 (90 Base) MCG/ACT inhaler Inhale 1-2 puffs into the lungs every 4 (four) hours as needed for wheezing or shortness  of breath. Inhale into the lungs. 01/19/22   [provider]  atorvastatin  (LIPITOR) 40 MG tablet Take 40 mg by mouth daily. 12/12/21   [provider]  clonazePAM  (KLONOPIN ) 1 MG tablet Take 1 mg by mouth 2 (two) times daily.    [provider]  fenofibrate  54 MG tablet Take 54 mg by mouth daily. 10/04/22   [provider]   gabapentin  (NEURONTIN ) 600 MG tablet Take by mouth 2 (two) times daily. 01/01/22   [provider]  lisinopril (ZESTRIL) 20 MG tablet Take 20 mg by mouth daily.    [provider]  metoprolol  succinate (TOPROL -XL) 50 MG 24 hr tablet Take 50 mg by mouth daily. Take with or immediately following a meal.    [provider]  naloxone  (NARCAN ) nasal spray 4 mg/0.1 mL Use as needed for accidental opioid overdose and call EMS 06/18/24   Jens Durand, MD  OLANZapine  (ZYPREXA ) 10 MG tablet Take 0.5-1 tablets (5-10 mg total) by mouth at bedtime as needed (nausea). 07/09/23   Borders, Fonda SAUNDERS, NP  ondansetron  (ZOFRAN ) 8 MG tablet Take 1 tablet (8 mg total) by mouth every 8 (eight) hours as needed for nausea or vomiting. 12/09/23   Jacobo Evalene PARAS, MD  oxyCODONE  (OXY IR/ROXICODONE ) 5 MG immediate release tablet Take 1 tablet (5 mg total) by mouth every 8 (eight) hours as needed for breakthrough pain. 06/18/24   Jens Durand, MD  oxyCODONE  (OXYCONTIN ) 10 mg 12 hr tablet Take 1 tablet (10 mg total) by mouth every 12 (twelve) hours. 06/29/24   Jacobo Evalene PARAS, MD  pantoprazole  (PROTONIX ) 40 MG tablet Take 1 tablet (40 mg total) by mouth daily. 03/11/22 06/15/24  Donette Ellouise LABOR, FNP  ranolazine  (RANEXA ) 500 MG 12 hr tablet Take 500 mg by mouth 2 (two) times daily.    [provider]  senna-docusate (SENOKOT-S) 8.6-50 MG tablet Take 1 tablet by mouth 2 (two) times daily. 06/18/24   Jens Durand, MD  sitaGLIPtin-metformin (JANUMET) 50-1000 MG tablet Take 1 tablet by mouth daily.    [provider]  venlafaxine  XR (EFFEXOR -XR) 75 MG 24 hr capsule Take 75 mg by mouth daily. 12/07/21   [provider]  prochlorperazine  (COMPAZINE ) 10 MG tablet Take 1 tablet (10 mg total) by mouth every 6 (six) hours as needed (Nausea or vomiting). 06/10/22 07/10/22  Jacobo Evalene PARAS, MD   Physical Exam: Vitals:   07/08/24 1121 07/08/24 1200 07/08/24 1245 07/08/24 1300  BP: (!)  93/55 109/72 122/69 119/71  Pulse: (!) 148 (!) 127 (!) 136 (!) 125  Resp: (!) 21   20  Temp: (!) 97.5 F (36.4 C)     TempSrc: Oral     SpO2: 100% 99% 100% 99%   Constitutional: appears frail, cachectic, chronically ill, weak Eyes: PERRL, lids and conjunctivae normal ENMT: Mucous membranes are moist. Posterior pharynx clear of any exudate or lesions. Age-appropriate dentition. Hearing appropriate Neck: normal, supple, no masses, no thyromegaly Respiratory: clear to auscultation bilaterally, no wheezing, no crackles. Normal respiratory effort. No accessory muscle use.  Cardiovascular: Regular rate and rhythm, no murmurs / rubs / gallops. No extremity edema. 2+ pedal pulses. No carotid bruits.  Abdomen: Generalized tenderness, no masses palpated, no hepatosplenomegaly. Bowel sounds positive.  Musculoskeletal: no clubbing / cyanosis. No joint deformity upper and lower extremities. Good ROM, no contractures, no atrophy. Normal muscle tone.  Skin: no rashes, lesions, ulcers. No induration Neurologic: Sensation intact. Strength 5/5 in all 4.  Psychiatric: Normal  judgment and insight. Alert and oriented x 3. Normal mood.   EKG: independently reviewed, showing sinus tachycardia with rate of 145, QTc 419  Chest x-ray on Admission: I personally reviewed and I agree with radiologist reading as below.  CT ABDOMEN PELVIS W CONTRAST Result Date: 07/08/2024 CLINICAL DATA:  Abdominal pain, prior intra-abdominal abscess, concern for sepsis. History of colon cancer. * Tracking Code: BO * EXAM: CT ABDOMEN AND PELVIS WITH CONTRAST TECHNIQUE: Multidetector CT imaging of the abdomen and pelvis was performed using the standard protocol following bolus administration of intravenous contrast. RADIATION DOSE REDUCTION: This exam was performed according to the departmental dose-optimization program which includes automated exposure control, adjustment of the mA and/or kV according to patient size and/or use of  iterative reconstruction technique. CONTRAST:  OMNIPAQUE  IOHEXOL  300 MG/ML  SOLN COMPARISON:  CT scan abdomen and pelvis from 06/15/2024. FINDINGS: Lower chest: The lung bases are clear. No pleural effusion. The heart is normal in size. No pericardial effusion. Hepatobiliary: The liver is normal in size. Non-cirrhotic configuration. No suspicious mass. These is mild diffuse hepatic steatosis. No intrahepatic or extrahepatic bile duct dilation. There is a 1 cm sized calcified gallstone without imaging signs of acute cholecystitis. Normal gallbladder wall thickness. No pericholecystic inflammatory changes. Pancreas: Unremarkable. No pancreatic ductal dilatation or surrounding inflammatory changes. Spleen: Within normal limits. No focal lesion. 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 Stable thickening/hyperplasia of right adrenal gland. No suspicious renal mass. There is a 1.3 x 1.6 cm simple cyst arising from the right kidney upper pole. No hydronephrosis. No renal or ureteric calculi. Urinary bladder is under distended, precluding optimal assessment. However, no large mass or stones identified. No perivesical fat stranding. Stomach/Bowel: Redemonstration of postsurgical changes in the distal stomach. There is irregular mild-to-moderate thickening of the distal stomach, which is grossly similar to the prior study. There is associated fat stranding and fluid in this region. There is slight interval decrease in the size of previously seen perigastric fluid collection (series 5, image 29), which contains small amount of nondependent air. Patient is status post right hemicolectomy with ileocolonic anastomosis in the left upper quadrant. There is an additional enteroenteric anastomosis in the left lower abdomen (series 2, image 45). At this level, there is redemonstration of a mildly irregular thick walled bowel loop measuring 3.9 cm in diameter. No significant  interval change. There is also a chronically thick walled small bowel loop in the left lower abdomen which communicates with several adjacent small bowel loops as well as extends up to the mesenteric border of the sigmoid colon (series 5, image 30). 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 Vascular/Lymphatic: No ascites or pneumoperitoneum. No abdominal or pelvic lymphadenopathy, by size criteria. No aneurysmal dilation of the major abdominal arteries. There are moderate peripheral atherosclerotic vascular calcifications of the aorta and its major branches. Reproductive: Not well evaluated on the CT scan exam. However, there is a retroverted uterus with a hyperattenuating lesion in the fundal region, which may represent subserosal leiomyoma, not well evaluated on this exam. No large adnexal mass. Other: Midline surgical scar noted. There is large hernia mesh. The soft tissues and abdominal wall are otherwise unremarkable. Musculoskeletal: No suspicious osseous lesions. There are moderate multilevel degenerative changes in the visualized spine. IMPRESSION: 1. Redemonstration of postsurgical changes in the distal stomach. There is irregular mild-to-moderate thickening of the distal stomach, which is grossly similar to the prior study. There is associated  fat stranding and fluid in this region. There is slight interval decrease in the size of previously seen perigastric fluid collection, which contains small amount of nondependent air. 2. Redemonstration of postsurgical changes in the left upper quadrant with redemonstration of a mildly irregular thick walled bowel loop measuring 3.9 cm in diameter. No significant interval change. 3. There is also a chronically thick walled small bowel loop in the left lower abdomen which communicates with several adjacent small bowel loops as well as extends up to the mesenteric border of the sigmoid colon. 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 4. Multiple other nonacute observations, as described above. Aortic Atherosclerosis (ICD10-I70.0). Electronically Signed   By: Ree Molt M.D.   On: 07/08/2024 13:49   DG Chest Port 1 View Result Date: 07/08/2024 CLINICAL DATA:  Weakness. EXAM: PORTABLE CHEST 1 VIEW COMPARISON:  CTA chest dated 06/15/2024. FINDINGS: The heart size and mediastinal contours are within normal limits. Right chest Port-A-Cath tip overlies the superior cavoatrial junction. Emphysema. No focal consolidation, pleural effusion, or pneumothorax. Remote healed right-sided rib fractures. No acute osseous abnormality. IMPRESSION: 1. No acute cardiopulmonary findings. 2. Emphysema. Electronically Signed   By: Harrietta Sherry M.D.   On: 07/08/2024 12:51   Labs on Admission: I have personally reviewed following labs  CBC: Recent Labs  Lab 07/08/24 1127  WBC 20.2*  NEUTROABS 18.0*  HGB 9.0*  HCT 29.6*  MCV 83.6  PLT 521*   Basic Metabolic Panel: Recent Labs  Lab 07/08/24 1127  NA 143  K 4.4  CL 105  CO2 22  GLUCOSE 180*  BUN 25*  CREATININE 0.95  CALCIUM  9.1  MG 1.4*  PHOS 4.5   GFR: CrCl cannot be calculated (Unknown ideal weight.).  Liver Function Tests: Recent Labs  Lab 07/08/24 1127  AST 19  ALT 13  ALKPHOS 53  BILITOT 0.7  PROT 6.4*  ALBUMIN 2.8*   Coagulation Profile: Recent Labs  Lab 07/08/24 1243  INR 1.1   Urine analysis:    Component Value Date/Time   COLORURINE YELLOW (A) 07/08/2024 1400   APPEARANCEUR HAZY (A) 07/08/2024 1400   LABSPEC 1.035 (H) 07/08/2024 1400   PHURINE 6.0 07/08/2024 1400   GLUCOSEU NEGATIVE 07/08/2024 1400   HGBUR NEGATIVE 07/08/2024 1400   BILIRUBINUR NEGATIVE 07/08/2024 1400   KETONESUR NEGATIVE 07/08/2024 1400   PROTEINUR 100 (A) 07/08/2024 1400   NITRITE NEGATIVE 07/08/2024 1400   LEUKOCYTESUR NEGATIVE 07/08/2024 1400   CRITICAL CARE Performed by: Dr.Devean Skoczylas  Total critical care time: 35 minutes  Critical care time was  exclusive of separately billable procedures and treating other patients.  Critical care was necessary to treat or prevent imminent or life-threatening deterioration.  Critical care was time spent personally by me on the following activities: development of treatment plan with patient, spouse, sister at bedside, as well as nursing, discussions with consultants, evaluation of patient's response to treatment, examination of patient, obtaining history from patient or surrogate, ordering and performing treatments and interventions, ordering and review of laboratory studies, ordering and review of radiographic studies, pulse oximetry and re-evaluation of patient's condition.  This document was prepared using Dragon Voice Recognition software and may include unintentional dictation errors.  Dr. Sherre [facility]  If 7PM-7AM, please contact overnight-coverage provider If 7AM-7PM, please contact day attending provider [email or web address]  07/08/2024, 5:50 PM

## 2024-07-08 NOTE — Assessment & Plan Note (Signed)
Hydralazine 5 mg IV every 6 hours as needed for SBP greater 170, 5 days ordered

## 2024-07-08 NOTE — ED Provider Notes (Signed)
 [facility] Provider Note    Event Date/Time   First MD Initiated Contact with Patient 07/08/24 1149     (approximate)   History   Weakness  Pt to ED from home with weakness and abdominal pain since being discharged 3 days ago. Pt's husband states that pt has been too weak to ambulate, less responsive than is baseline, denies CP/SOB. Hx stage 4 Colon Cancer not actively receiving treatment.    HPI Jessica Stout is a 73 y.o. female PMH colon cancer status post chemotherapy and gastrotomy and hemicolectomy, hypertension, anemia, DM, COPD/asthma, CHF, prior stroke, anemia presents for evaluation of weakness, abdominal discomfort - Patient is been weak and unable to walk ever since she was discharged from the hospital in mid July.  Not tolerating much p.o. intake and has been having worsening abdominal pain.  Presents today because pain has become severe. - No fever - Last bowel movement yesterday - No chest pain   Per chart review, was recently admitted 7/16-7/19/2025.  Patient was found to have an intra-abdominal abscess, treated with Zosyn , discharged on Augmentin , no procedural drainage.       Physical Exam   Triage Vital Signs: ED Triage Vitals [07/08/24 1121]  Encounter Vitals Group     BP (!) 93/55     Girls Systolic BP Percentile      Girls Diastolic BP Percentile      Boys Systolic BP Percentile      Boys Diastolic BP Percentile      Pulse Rate (!) 148     Resp (!) 21     Temp (!) 97.5 F (36.4 C)     Temp Source Oral     SpO2 100 %     Weight      Height      Head Circumference      Peak Flow      Pain Score      Pain Loc      Pain Education      Exclude from Growth Chart     Most recent vital signs: Vitals:   07/08/24 1121  BP: (!) 93/55  Pulse: (!) 148  Resp: (!) 21  Temp: (!) 97.5 F (36.4 C)  SpO2: 100%     General: Awake, appears uncomfortable CV:  Good peripheral perfusion.  Tachycardic, regular rhythm, RP  2+ Resp:  Normal effort. CTAB Abd:  No distention.  Tender to palpation throughout lower abdomen Other:  No significant CVA tenderness   ED Results / Procedures / Treatments   Labs (all labs ordered are listed, but only abnormal results are displayed) Labs Reviewed  COMPREHENSIVE METABOLIC PANEL WITH GFR - Abnormal; Notable for the following components:      Result Value   Glucose, Bld 180 (*)    BUN 25 (*)    Total Protein 6.4 (*)    Albumin 2.8 (*)    Anion gap 16 (*)    All other components within normal limits  CBC WITH DIFFERENTIAL/PLATELET - Abnormal; Notable for the following components:   WBC 20.2 (*)    RBC 3.54 (*)    Hemoglobin 9.0 (*)    HCT 29.6 (*)    MCH 25.4 (*)    RDW 17.3 (*)    Platelets 521 (*)    Neutro Abs 18.0 (*)    Abs Immature Granulocytes 0.18 (*)    All other components within normal limits  CULTURE, BLOOD (ROUTINE X 2)  CULTURE, BLOOD (ROUTINE  X 2)  LACTIC ACID, PLASMA  LACTIC ACID, PLASMA  URINALYSIS, W/ REFLEX TO CULTURE (INFECTION SUSPECTED)  PROTIME-INR     EKG  Ecg = sinus tachycardia, rate 145, no gross ST elevation, some ST depressions noted diffusely (suspect rate dependent), no significant repolarization abnormality, normal axis, normal intervals.  Abnormal EKG.   RADIOLOGY Radiology interpreted by myself and radiology report reviewed.  Concerning for recurrent/persistent abscess as well as possible fistula formations.    PROCEDURES:  Critical Care performed: Yes, see critical care procedure note(s)  .Critical Care  Performed by: Clarine Ozell LABOR, MD Authorized by: Clarine Ozell LABOR, MD   Critical care provider statement:    Critical care time (minutes):  30   Critical care time was exclusive of:  Separately billable procedures and treating other patients   Critical care was necessary to treat or prevent imminent or life-threatening deterioration of the following conditions:  Sepsis   Critical care was time spent  personally by me on the following activities:  Development of treatment plan with patient or surrogate, discussions with consultants, evaluation of patient's response to treatment, examination of patient, ordering and review of laboratory studies, ordering and review of radiographic studies, ordering and performing treatments and interventions, pulse oximetry, re-evaluation of patient's condition and review of old charts   I assumed direction of critical care for this patient from another provider in my specialty: no     Care discussed with: admitting provider      MEDICATIONS ORDERED IN ED: Medications  sodium chloride  0.9 % bolus 500 mL (has no administration in time range)  piperacillin -tazobactam (ZOSYN ) IVPB 3.375 g (has no administration in time range)     IMPRESSION / MDM / ASSESSMENT AND PLAN / ED COURSE  I reviewed the triage vital signs and the nursing notes.                              DDX/MDM/AP: Differential diagnosis includes, but is not limited to, concern for persistent or recurrent intra-abdominal abscess, considered bowel perforation.  Consider possibility of systemic infection notable tachycardia and relative hypotension here with significant leukocytosis on screening labs to 20.  Plan: - Labs - EKG - Cardiac monitor - Chest x-ray - CT abdomen pelvis - Pain control - Empiric antibiotics - Anticipate admission  Patient's presentation is most consistent with acute presentation with potential threat to life or bodily function.  The patient is on the cardiac monitor to evaluate for evidence of arrhythmia and/or significant heart rate changes.  ED course below.  Patient mated to hospitalist service in setting of apparent ongoing abscesses as well as concern for fistula formation, I do suspect this has caused sepsis.  Blood pressures improved with IV fluid and heart rate is normalizing.  Treating broadly with Zosyn .  Surgery consulted, will evaluate patient as well.   Can consider whether IR may be indicated at some point for drainage if not appropriate surgical approach or candidate.  Given reduced modified fluid bolus of 500 cc IV fluid given history of CHF, not meeting septic shock criteria, responded well to fluid.  Clinical Course as of 07/08/24 1613  Fri Jul 08, 2024  1159 CBC with notable leukocytosis to 20 with left shift, improved anemia  CMP reviewed, overall reassuring [MM]  1222 Lactate wnl [MM]  1253 CXR: IMPRESSION: 1. No acute cardiopulmonary findings. 2. Emphysema.   [MM]  1401 CTAP: IMPRESSION: 1. Redemonstration of postsurgical changes in  the distal stomach. There is irregular mild-to-moderate thickening of the distal stomach, which is grossly similar to the prior study. There is associated fat stranding and fluid in this region. There is slight interval decrease in the size of previously seen perigastric fluid collection, which contains small amount of nondependent air. 2. Redemonstration of postsurgical changes in the left upper quadrant with redemonstration of a mildly irregular thick walled bowel loop measuring 3.9 cm in diameter. No significant interval change. 3. There is also a chronically thick walled small bowel loop in the left lower abdomen which communicates with several adjacent small bowel loops as well as extends up to the mesenteric border of the sigmoid colon. 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 4. Multiple other nonacute observations, as described above.   [MM]  1401 Hospitalist consult order placed [MM]  1405 D/w Dr. Lane of general surgery - Familiar with patient, not a good surgical candidate but will reevaluate, can consider IR as needed  Hospitalist consult order placed [MM]    Clinical Course User Index [MM] Clarine Ozell LABOR, MD     FINAL CLINICAL IMPRESSION(S) / ED DIAGNOSES   Final diagnoses:  None     Rx / DC Orders   ED Discharge Orders      None        Note:  This document was prepared using Dragon voice recognition software and may include unintentional dictation errors.   Clarine Ozell LABOR, MD 07/08/24 [phone]

## 2024-07-08 NOTE — Assessment & Plan Note (Signed)
 Magnesium  was 1.4 on admission Replace with magnesium  1 mg IV one-time dose Recheck magnesium  level in a.m.

## 2024-07-08 NOTE — Consult Note (Signed)
 Pharmacy Antibiotic Note  Jessica Stout is a 73 y.o. female admitted on 07/08/2024 with abdominal pain / concern for sepsis. Pharmacy has been consulted for vancomycin  & Zosyn  dosing.  Patient recently admitted in 05/2024, Wt at that time was around 52 kg  Plan:  Zosyn  3.375g IV q8h (4 hour infusion).  Vancomycin  1 g IV x 1 in the ED followed by 750 mg IV q24h --Calculated AUC: 510, Cmin: 13.3 --Scr 0.95, TBW, Vd 0.72 --Daily Scr per protocol --Levels at steady state or as clinically indicated  Temp (24hrs), Avg:97.5 F (36.4 C), Min:97.5 F (36.4 C), Max:97.5 F (36.4 C)  Recent Labs  Lab 07/08/24 1127 07/08/24 1243  WBC 20.2*  --   CREATININE 0.95  --   LATICACIDVEN 1.9 0.8    CrCl cannot be calculated (Unknown ideal weight.).    Allergies  Allergen Reactions   Codeine Rash and Dermatitis    Happened in 1970s    Antimicrobials this admission: Zosyn  8/8 >>  Vancomycin  8/8 >>   Dose adjustments this admission: N/A  Microbiology results: 8/8 BCx: pending  Thank you for allowing pharmacy to be a part of this patient's care.  Marolyn KATHEE Mare 07/08/2024 2:37 PM

## 2024-07-09 ENCOUNTER — Other Ambulatory Visit: Payer: Self-pay

## 2024-07-09 DIAGNOSIS — Z7189 Other specified counseling: Secondary | ICD-10-CM | POA: Diagnosis not present

## 2024-07-09 DIAGNOSIS — C189 Malignant neoplasm of colon, unspecified: Secondary | ICD-10-CM

## 2024-07-09 DIAGNOSIS — K651 Peritoneal abscess: Secondary | ICD-10-CM

## 2024-07-09 DIAGNOSIS — Z66 Do not resuscitate: Secondary | ICD-10-CM

## 2024-07-09 DIAGNOSIS — A419 Sepsis, unspecified organism: Secondary | ICD-10-CM | POA: Diagnosis not present

## 2024-07-09 DIAGNOSIS — K632 Fistula of intestine: Secondary | ICD-10-CM | POA: Diagnosis not present

## 2024-07-09 DIAGNOSIS — Z515 Encounter for palliative care: Secondary | ICD-10-CM | POA: Diagnosis not present

## 2024-07-09 DIAGNOSIS — Z789 Other specified health status: Secondary | ICD-10-CM

## 2024-07-09 DIAGNOSIS — R627 Adult failure to thrive: Secondary | ICD-10-CM

## 2024-07-09 DIAGNOSIS — R109 Unspecified abdominal pain: Secondary | ICD-10-CM

## 2024-07-09 LAB — CBC
HCT: 24.7 % — ABNORMAL LOW (ref 36.0–46.0)
Hemoglobin: 7.6 g/dL — ABNORMAL LOW (ref 12.0–15.0)
MCH: 25.8 pg — ABNORMAL LOW (ref 26.0–34.0)
MCHC: 30.8 g/dL (ref 30.0–36.0)
MCV: 83.7 fL (ref 80.0–100.0)
Platelets: 351 K/uL (ref 150–400)
RBC: 2.95 MIL/uL — ABNORMAL LOW (ref 3.87–5.11)
RDW: 17.5 % — ABNORMAL HIGH (ref 11.5–15.5)
WBC: 12.6 K/uL — ABNORMAL HIGH (ref 4.0–10.5)
nRBC: 0 % (ref 0.0–0.2)

## 2024-07-09 LAB — BASIC METABOLIC PANEL WITH GFR
Anion gap: 9 (ref 5–15)
BUN: 19 mg/dL (ref 8–23)
CO2: 25 mmol/L (ref 22–32)
Calcium: 8.2 mg/dL — ABNORMAL LOW (ref 8.9–10.3)
Chloride: 109 mmol/L (ref 98–111)
Creatinine, Ser: 0.77 mg/dL (ref 0.44–1.00)
GFR, Estimated: >60 mL/min (ref >60)
Glucose, Bld: 103 mg/dL — ABNORMAL HIGH (ref 70–99)
Potassium: 4.1 mmol/L (ref 3.5–5.1)
Sodium: 143 mmol/L (ref 135–145)

## 2024-07-09 LAB — MAGNESIUM: Magnesium: 1.7 mg/dL (ref 1.7–2.4)

## 2024-07-09 LAB — GLUCOSE, CAPILLARY
Glucose-Capillary: 111 mg/dL — ABNORMAL HIGH (ref 70–99)
Glucose-Capillary: 110 mg/dL — ABNORMAL HIGH (ref 70–99)
Glucose-Capillary: 102 mg/dL — ABNORMAL HIGH (ref 70–99)
Glucose-Capillary: 110 mg/dL — ABNORMAL HIGH (ref 70–99)
Glucose-Capillary: 93 mg/dL (ref 70–99)

## 2024-07-09 MED ORDER — FENOFIBRATE 54 MG PO TABS
54.0000 mg | ORAL_TABLET | Freq: Every day | ORAL | Status: DC
  Administered 2024-07-09 – 2024-07-12 (×6): 54 mg via ORAL
  Filled 2024-07-09 (×4): qty 1

## 2024-07-09 MED ORDER — SENNOSIDES-DOCUSATE SODIUM 8.6-50 MG PO TABS
1.0000 | ORAL_TABLET | Freq: Two times a day (BID) | ORAL | Status: DC
  Administered 2024-07-09 – 2024-07-12 (×9): 1 via ORAL
  Filled 2024-07-09 (×6): qty 1

## 2024-07-09 MED ORDER — ATORVASTATIN CALCIUM 20 MG PO TABS
40.0000 mg | ORAL_TABLET | Freq: Every day | ORAL | Status: DC
  Administered 2024-07-09 – 2024-07-12 (×6): 40 mg via ORAL
  Filled 2024-07-09 (×4): qty 2

## 2024-07-09 MED ORDER — ALBUTEROL SULFATE (2.5 MG/3ML) 0.083% IN NEBU: 2.5000 mg | INHALATION_SOLUTION | RESPIRATORY_TRACT | Status: DC | PRN

## 2024-07-09 MED ORDER — PANTOPRAZOLE SODIUM 40 MG PO TBEC
40.0000 mg | DELAYED_RELEASE_TABLET | Freq: Every day | ORAL | Status: DC
  Administered 2024-07-09 – 2024-07-12 (×6): 40 mg via ORAL
  Filled 2024-07-09 (×4): qty 1

## 2024-07-09 MED ORDER — OXYCODONE HCL 5 MG PO TABS
5.0000 mg | ORAL_TABLET | Freq: Four times a day (QID) | ORAL | Status: DC | PRN
  Filled 2024-07-09: qty 1

## 2024-07-09 MED ORDER — METOPROLOL SUCCINATE ER 50 MG PO TB24
50.0000 mg | ORAL_TABLET | Freq: Every day | ORAL | Status: DC
  Administered 2024-07-09 – 2024-07-12 (×6): 50 mg via ORAL
  Filled 2024-07-09 (×4): qty 1

## 2024-07-09 NOTE — Plan of Care (Signed)
  Problem: Education: Goal: Ability to describe self-care measures that may prevent or decrease complications (Diabetes Survival Skills Education) will improve Outcome: Progressing   Problem: Coping: Goal: Ability to adjust to condition or change in health will improve Outcome: Progressing   Problem: Fluid Volume: Goal: Ability to maintain a balanced intake and output will improve Outcome: Progressing   Problem: Metabolic: Goal: Ability to maintain appropriate glucose levels will improve Outcome: Progressing   Problem: Nutritional: Goal: Maintenance of adequate nutrition will improve Outcome: Progressing Goal: Progress toward achieving an optimal weight will improve Outcome: Progressing   Problem: Skin Integrity: Goal: Risk for impaired skin integrity will decrease Outcome: Progressing   Problem: Fluid Volume: Goal: Hemodynamic stability will improve Outcome: Progressing

## 2024-07-09 NOTE — Plan of Care (Signed)
   Problem: Coping: Goal: Ability to adjust to condition or change in health will improve Outcome: Progressing   Problem: Fluid Volume: Goal: Ability to maintain a balanced intake and output will improve Outcome: Progressing   Problem: Health Behavior/Discharge Planning: Goal: Ability to identify and utilize available resources and services will improve Outcome: Progressing

## 2024-07-09 NOTE — Consult Note (Addendum)
 Consultation Note Date: 07/09/2024 at 1525  Patient Name: Jessica Stout  DOB: July 28, 1951  MRN: [id number]  Age / Sex: 73 y.o., female  PCP: Corlis Honor BROCKS, MD Referring Physician: Jerelene Critchley, MD  HPI/Patient Profile: 73 y.o. female  with past medical history significant for colon cancer post chemotherapy, partial gastrectomy, R hemicolectomy, HTN, IDDM, HLD, CVA, depression, anxiety and COPD/asthma. Patient presented to ED 07/08/2024 c/o weakness and abdominal pain.   Clinical Assessment and Goals of Care:  Ill-appearing, elderly female sleeping in bed with husband sleeping at bedside. She awakens to gentle verbal/tactile stimuli. Patient is able to engage in meaningful conversation and able to make her wishes known. She is alert and oriented to self, time, location and situation. Even, unlabored respirations. She is in no distress.   Complains of pain to bottom from bedsore and abdominal pain not relieved with current medication. Feels better today. Endorses intermittent CP, SOB and nausea. Has not been eating and has no appetite. She has been drinking milk and water. Complains of feeling hot and requesting portable fan. Fan provided from floor stock.   Extensive chart review completed prior to meeting patient including labs, vital signs, imaging, progress notes, orders, and available advanced directive documents from current and previous encounters. I then met with patient and husband to discuss diagnosis prognosis, GOC, EOL wishes, disposition and options.  I introduced Palliative Medicine as specialized medical care for people living with serious illness. It focuses on providing relief from the symptoms and stress of a serious illness. The goal is to improve quality of life for both the patient and the family.  We discussed a brief life review of the patient. Mrs. Towell and her husband Laurier have been married  for 53 years. They have one daughter who lives local and one son who lives in [place]. They have 5 grandchildren and 2 great-grandchildren. They moved [place] to [place]  due to husbands Market researcher. 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 They returned to [facility] in [facility].   As far as functional and nutritional status prior to this admission, patient required assistance with her ADLs. She was able to get up and go to BR with husbands help, but mostly stayed in the bed. Laurier states that she has not had a good appetite since her cancer diagnosis and surgeries. He estimates >50 lb weight loss.   We discussed patient's current illness and what it means in the larger context of patient's on-going co-morbidities.  Natural disease trajectory and expectations at EOL were discussed.  I attempted to elicit values and goals of care important to the patient. When asking about her goals, the patient replied I think I am ready for the end. I clarified by asking patient if she wants hospice care to which states yes.  The difference between aggressive medical intervention and comfort care was considered in light of the patient's goals of care.   Advance directives, concepts specific to code status, artificial feeding and hydration, and rehospitalization  were considered and discussed. The patient does want to continue antibiotics and fluids for her current intraabdominal abscess as it is causing significant pain. She is adamant at this stage, she does not want surgery or other invasive interventions including drains. She again states that she is ready for hospice care.   Education offered regarding concept specific to human mortality and the limitations of medical interventions to prolong life when the body begins to fail to thrive.  Family is facing treatment option decisions, advanced directive, and anticipatory care needs.    Discussed with patient/family the  importance of continued conversation with family and the medical providers regarding overall plan of care and treatment options, ensuring decisions are within the context of the patient's values and GOCs.    Hospice and Palliative Care services outpatient were explained and offered. Patient wants to go home with hospice once she is medically stable to discharge home. Laurier is in agreement with the patients wishes stating, I want what she wants.  Referral placed for TOC/ACC to coordinate home with hospice through [facility].   Discussed with Dr. Jerelene addition of short acting narcotic for breakthrough pain who agrees. Order placed for oxycodone  5 mg q6 PRN.   Questions and concerns were addressed. The family was encouraged to call with questions or concerns.   Primary Decision Maker PATIENT  Physical Exam Vitals reviewed.  Constitutional:      General: She is not in acute distress.    Appearance: She is ill-appearing.     Comments: Thin, frail  HENT:     Head: Normocephalic and atraumatic.     Mouth/Throat:     Mouth: Mucous membranes are moist.  Pulmonary:     Effort: Pulmonary effort is normal. No respiratory distress.  Skin:    General: Skin is warm and dry.     Coloration: Skin is pale.     Comments: Accessed port to R chest  Neurological:     Mental Status: She is alert and oriented to person, place, and time.     Motor: Weakness present.  Psychiatric:        Mood and Affect: Mood normal.        Behavior: Behavior normal.        Thought Content: Thought content normal.     Palliative Assessment/Data: 30-40%   Discussed plan of care with Dr. Jerelene, [facility] and [facility] liaison.   Thank you for this consult. Palliative medicine will continue to follow and assist holistically.   Time Total: 75 minutes  Time spent includes: Detailed review of medical records (labs, imaging, vital signs), medically appropriate exam (mental status, respiratory, cardiac, skin), discussed  with treatment team, counseling and educating patient, family and staff, documenting clinical information, medication management and coordination of care.     Devere Sacks, ELNITA- [facility] Palliative Medicine Team  07/09/2024 2:16 PM  Office [phone]  Pager [phone]     Please contact Palliative Medicine Team providers via AMION for questions and concerns.

## 2024-07-09 NOTE — TOC Initial Note (Signed)
 Transition of Care [facility]) - Initial/Assessment Note    Patient Details  Name: Gwendolyne Welford MRN: [id number] Date of Birth: [birth year]  Transition of Care [facility] CM/SW Contact:    [place] L Bradan Congrove, LCSW Phone Number: 07/09/2024, 3:56 PM  Clinical Narrative:                  CSW received secure chat advising that patient wanted hospice at home. CSW called patients room and spoke with Mr. Braaten, spouse. Mr. Pitones confirmed that, that is what he and his wife discussed. Mr. Rosell advised that he was agreeable to [facility].   CSW responded in the secure chat and requested that the liaison proceed with the referral.        Patient Goals and CMS Choice            Expected Discharge Plan and Services                                              Prior Living Arrangements/Services                       Activities of Daily Living   ADL Screening (condition at time of admission) Independently performs ADLs?: Yes (appropriate for developmental age) Is the patient deaf or have difficulty hearing?: Yes Does the patient have difficulty seeing, even when wearing glasses/contacts?: No Does the patient have difficulty concentrating, remembering, or making decisions?: No  Permission Sought/Granted                  Emotional Assessment              Admission diagnosis:  Intra-abdominal abscess (HCC) [K65.1] Sepsis (HCC) [A41.9] Sepsis, due to unspecified organism, unspecified whether acute organ dysfunction present [facility]) [A41.9] Patient Active Problem List   Diagnosis Date Noted   Sepsis (HCC) 07/08/2024   Leukocytosis 07/08/2024   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   Failure to thrive in adult 07/08/2024   Chest pain 06/16/2024   Hypomagnesemia 06/16/2024   Abnormal abdominal CT scan 06/16/2024   Nausea vomiting and diarrhea 06/16/2024   Abdominal pain 06/15/2024   Intra-abdominal abscess (HCC) 06/15/2024   HTN (hypertension) 06/15/2024   Diabetes mellitus without  complication (HCC) 06/15/2024   Pressure injury of buttock, unstageable (HCC) 10/11/2022   Chronic systolic CHF (congestive heart failure) (HCC) 10/11/2022   Acute metabolic encephalopathy 10/11/2022   Lung cancer (HCC) 10/11/2022   Nonspecific abnormal results of pulmonary system function study 03/19/2022   Adjustment disorder with depressed mood 03/19/2022   Arthritis 03/19/2022   Colon cancer (HCC) 02/04/2022   Duodenal ulcer without hemorrhage or perforation and without obstruction 02/03/2022   Erosive gastropathy 02/03/2022   Colonic mass 02/03/2022   Hypokalemia 02/01/2022   Diabetes mellitus type 2, controlled, without complications (HCC) 01/29/2022   Chronic obstructive pulmonary disease (HCC) 01/29/2022   New onset of HFrEF 01/29/2022   Hyperlipidemia 01/29/2022   Anxiety and depression 01/29/2022   Iron  deficiency anemia 01/29/2022   Acute respiratory failure with hypoxia (HCC)    PCP:  Corlis Honor BROCKS, MD Pharmacy:   [facility], [place] - [address] [place] [address] Phone: [phone] Fax: [phone]     Social Drivers of Health (SDOH) Social History: SDOH Screenings   Food Insecurity: No Food Insecurity (07/08/2024)  Housing: Low  Risk  (07/08/2024)  Transportation Needs: No Transportation Needs (07/08/2024)  Utilities: Not At Risk (07/08/2024)  Depression (PHQ2-9): Low Risk  (03/11/2022)  Financial Resource Strain: Low Risk  (05/30/2022)   Received from [facility]  Physical Activity: Insufficiently Active (03/27/2022)   Received from [facility]  Social Connections: Unknown (07/08/2024)  Tobacco Use: Medium Risk (07/08/2024)   SDOH Interventions:     Readmission Risk Interventions     No data to display

## 2024-07-09 NOTE — Progress Notes (Signed)
 PROGRESS NOTE    Jessica Stout  FMW:[id number] DOB: March 13, 1951 DOA: 07/08/2024 PCP: Corlis Honor BROCKS, MD  Chief Complaint  Patient presents with   Weakness    Hospital Course:  Jessica Stout is a 73 year old female with history of colon cancer status postchemotherapy, partial gastrectomy, right hemicolectomy, hypertension, insulin -dependent diabetes mellitus, hyperlipidemia, CVA, depression, anxiety, COPD/asthma, presented with chief concerns of weakness and abdominal pain. Patient is admitted with concerns for sepsis likely due to intra-abdominal source.  On IV antibiotics.  Seen by surgery, no intervention recommended. Seen by palliative, plan for discharge to home with home hospice when medically ready.  Hospital course as below   Subjective: Patient was examined at the bedside, spouse and other family members present at the bedside. Discussed clinical course and long term prognosis, Palliative care consulted   Objective: Vitals:   07/08/24 2350 07/09/24 0405 07/09/24 0624 07/09/24 0832  BP: 136/83 (!) 140/79 (!) 151/79 138/89  Pulse: (!) 52 95  (!) 57  Resp: 20 20 20    Temp: (!) 97.4 F (36.3 C) 98.5 F (36.9 C)  97.8 F (36.6 C)  TempSrc:      SpO2: 99% 95%  97%  Weight:      Height:        Intake/Output Summary (Last 24 hours) at 07/09/2024 0931 Last data filed at 07/09/2024 9364 Gross per 24 hour  Intake 2190.34 ml  Output 250 ml  Net 1940.34 ml   Filed Weights   07/08/24 2000  Weight: 48.5 kg    Examination: Constitutional: appears frail, cachectic, chronically ill, weak Neck: normal, supple, no masses, no thyromegaly Respiratory: clear to auscultation bilaterally, no wheezing, no crackles. Normal respiratory effort. No accessory muscle use.  Cardiovascular: Regular rate and rhythm, no murmurs / rubs / gallops. No extremity edema. 2+ pedal pulses. No carotid bruits.  Abdomen: Generalized tenderness, no masses palpated, no hepatosplenomegaly. Bowel sounds positive.   Musculoskeletal: no clubbing / cyanosis. No joint deformity upper and lower extremities. Good ROM, no contractures, no atrophy. Normal muscle tone.  Skin: no rashes, lesions, ulcers. No induration Neurologic: Sensation intact. Strength 5/5 in all 4.   Assessment & Plan:  Principal Problem:   Sepsis (HCC) Active Problems:   Abdominal pain   Hyperlipidemia   Anxiety and depression   Intra-abdominal abscess (HCC)   HTN (hypertension)   Colon cancer (HCC)   Diabetes mellitus type 2, controlled, without complications (HCC)   Hypomagnesemia   Leukocytosis   Enterocolic fistula   Failure to thrive in adult   Sepsis Intra-abdominal abscess  - Suspect source is intra-abdominal in setting of enteroenteric and enterocolic fistula with likely abscess - Leukocytosis improving - IV Zosyn , vancomycin  per pharmacy - s/p IV fluids - General Surgery has been consulted, appreciate recs, no improvement with any surgical intervention - Blood cultures pending   Abdominal pain Secondary to cancer related pain PDMP reviewed Patient takes home OxyContin  10 mg twice a day Add oxycodone  5mg  q6 prn   HTN (hypertension) Hydralazine  5 mg IV every 6 hours as needed for SBP greater 170   Failure to thrive in adult GOC Palliative care consulted - plan for hospice at home when ready medically   Hypomagnesemia - resolved Monitor   Diabetes mellitus type 2, controlled, without complications (HCC) Insulin  SSI with at bedtime ordered  Chronic CHF with midrange ejection fraction (congestive heart failure) (HCC): Compensated. 2D echo on 12/25/2022 showed EF of 45 to 50% with grade 1 diastolic dysfunction   DVT  prophylaxis: Heparin  SQ   Code Status: Limited: Do not attempt resuscitation (DNR) -DNR-LIMITED -Do Not Intubate/DNI  Disposition:  Home with hospice  Consultants:  General Surgery  Procedures:  None  Antimicrobials:  Anti-infectives (From admission, onward)    Start     Dose/Rate  Route Frequency Ordered Stop   07/09/24 1200  vancomycin  (VANCOREADY) IVPB 750 mg/150 mL        750 mg 150 mL/hr over 60 Minutes Intravenous Every 24 hours 07/08/24 1442     07/08/24 2000  piperacillin -tazobactam (ZOSYN ) IVPB 3.375 g        3.375 g 12.5 mL/hr over 240 Minutes Intravenous Every 8 hours 07/08/24 1436     07/08/24 1230  piperacillin -tazobactam (ZOSYN ) IVPB 3.375 g        3.375 g 12.5 mL/hr over 240 Minutes Intravenous  Once 07/08/24 1224 07/08/24 1739   07/08/24 1230  vancomycin  (VANCOCIN ) IVPB 1000 mg/200 mL premix        1,000 mg 200 mL/hr over 60 Minutes Intravenous  Once 07/08/24 1228 07/08/24 1455       Data Reviewed: I have personally reviewed following labs and imaging studies CBC: Recent Labs  Lab 07/08/24 1127 07/09/24 0350  WBC 20.2* 12.6*  NEUTROABS 18.0*  --   HGB 9.0* 7.6*  HCT 29.6* 24.7*  MCV 83.6 83.7  PLT 521* 351   Basic Metabolic Panel: Recent Labs  Lab 07/08/24 1127 07/09/24 0350  NA 143 143  K 4.4 4.1  CL 105 109  CO2 22 25  GLUCOSE 180* 103*  BUN 25* 19  CREATININE 0.95 0.77  CALCIUM  9.1 8.2*  MG 1.4* 1.7  PHOS 4.5  --    GFR: Estimated Creatinine Clearance: 46.5 mL/min (by C-G formula based on SCr of 0.77 mg/dL). Liver Function Tests: Recent Labs  Lab 07/08/24 1127  AST 19  ALT 13  ALKPHOS 53  BILITOT 0.7  PROT 6.4*  ALBUMIN 2.8*   CBG: Recent Labs  Lab 07/08/24 1837 07/08/24 2156 07/09/24 0405 07/09/24 0834  GLUCAP 153* 73 111* 110*    Recent Results (from the past 240 hours)  Blood Culture (routine x 2)     Status: None (Preliminary result)   Collection Time: 07/08/24 12:43 PM   Specimen: Porta Cath; Blood  Result Value Ref Range Status   Specimen Description PORTA CATH  Final   Special Requests   Final    BOTTLES DRAWN AEROBIC AND ANAEROBIC Blood Culture results may not be optimal due to an inadequate volume of blood received in culture bottles   Culture   Final    NO GROWTH < 24 HOURS Performed at  [facility], [address] [place], [place] [address]    Report Status PENDING  Incomplete  Blood Culture (routine x 2)     Status: None (Preliminary result)   Collection Time: 07/08/24 12:43 PM   Specimen: BLOOD RIGHT ARM  Result Value Ref Range Status   Specimen Description BLOOD RIGHT ARM  Final   Special Requests   Final    BOTTLES DRAWN AEROBIC AND ANAEROBIC Blood Culture results may not be optimal due to an inadequate volume of blood received in culture bottles   Culture   Final    NO GROWTH < 24 HOURS Performed at [facility], [address], [place], [place] [address]    Report Status PENDING  Incomplete     Radiology Studies: CT ABDOMEN PELVIS W CONTRAST Result Date: 07/08/2024 CLINICAL DATA:  Abdominal  pain, prior intra-abdominal abscess, concern for sepsis. History of colon cancer. * Tracking Code: BO * EXAM: CT ABDOMEN AND PELVIS WITH CONTRAST TECHNIQUE: Multidetector CT imaging of the abdomen and pelvis was performed using the standard protocol following bolus administration of intravenous contrast. RADIATION DOSE REDUCTION: This exam was performed according to the departmental dose-optimization program which includes automated exposure control, adjustment of the mA and/or kV according to patient size and/or use of iterative reconstruction technique. CONTRAST:  OMNIPAQUE  IOHEXOL  300 MG/ML  SOLN COMPARISON:  CT scan abdomen and pelvis from 06/15/2024. FINDINGS: Lower chest: The lung bases are clear. No pleural effusion. The heart is normal in size. No pericardial effusion. Hepatobiliary: The liver is normal in size. Non-cirrhotic configuration. No suspicious mass. These is mild diffuse hepatic steatosis. No intrahepatic or extrahepatic bile duct dilation. There is a 1 cm sized calcified gallstone without imaging signs of acute cholecystitis. Normal gallbladder wall thickness. No pericholecystic inflammatory changes. Pancreas: Unremarkable. No pancreatic  ductal dilatation or surrounding inflammatory changes. Spleen: Within normal limits. No focal lesion. 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 Stable thickening/hyperplasia of right adrenal gland. No suspicious renal mass. There is a 1.3 x 1.6 cm simple cyst arising from the right kidney upper pole. No hydronephrosis. No renal or ureteric calculi. Urinary bladder is under distended, precluding optimal assessment. However, no large mass or stones identified. No perivesical fat stranding. Stomach/Bowel: Redemonstration of postsurgical changes in the distal stomach. There is irregular mild-to-moderate thickening of the distal stomach, which is grossly similar to the prior study. There is associated fat stranding and fluid in this region. There is slight interval decrease in the size of previously seen perigastric fluid collection (series 5, image 29), which contains small amount of nondependent air. Patient is status post right hemicolectomy with ileocolonic anastomosis in the left upper quadrant. There is an additional enteroenteric anastomosis in the left lower abdomen (series 2, image 45). At this level, there is redemonstration of a mildly irregular thick walled bowel loop measuring 3.9 cm in diameter. No significant interval change. There is also a chronically thick walled small bowel loop in the left lower abdomen which communicates with several adjacent small bowel loops as well as extends up to the mesenteric border of the sigmoid colon (series 5, image 30). 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 Vascular/Lymphatic: No ascites or pneumoperitoneum. No abdominal or pelvic lymphadenopathy, by size criteria. No aneurysmal dilation of the major abdominal arteries. There are moderate peripheral atherosclerotic vascular calcifications of the aorta and its major branches. Reproductive: Not well evaluated on the CT scan exam. However, there  is a retroverted uterus with a hyperattenuating lesion in the fundal region, which may represent subserosal leiomyoma, not well evaluated on this exam. No large adnexal mass. Other: Midline surgical scar noted. There is large hernia mesh. The soft tissues and abdominal wall are otherwise unremarkable. Musculoskeletal: No suspicious osseous lesions. There are moderate multilevel degenerative changes in the visualized spine. IMPRESSION: 1. Redemonstration of postsurgical changes in the distal stomach. There is irregular mild-to-moderate thickening of the distal stomach, which is grossly similar to the prior study. There is associated fat stranding and fluid in this region. There is slight interval decrease in the size of previously seen perigastric fluid collection, which contains small amount of nondependent air. 2. Redemonstration of postsurgical changes in the left upper quadrant with redemonstration of a mildly irregular thick walled bowel loop measuring 3.9 cm in diameter.  No significant interval change. 3. There is also a chronically thick walled small bowel loop in the left lower abdomen which communicates with several adjacent small bowel loops as well as extends up to the mesenteric border of the sigmoid colon. 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 4. Multiple other nonacute observations, as described above. Aortic Atherosclerosis (ICD10-I70.0). Electronically Signed   By: Ree Molt M.D.   On: 07/08/2024 13:49   DG Chest Port 1 View Result Date: 07/08/2024 CLINICAL DATA:  Weakness. EXAM: PORTABLE CHEST 1 VIEW COMPARISON:  CTA chest dated 06/15/2024. FINDINGS: The heart size and mediastinal contours are within normal limits. Right chest Port-A-Cath tip overlies the superior cavoatrial junction. Emphysema. No focal consolidation, pleural effusion, or pneumothorax. Remote healed right-sided rib fractures. No acute osseous abnormality. IMPRESSION: 1. No acute  cardiopulmonary findings. 2. Emphysema. Electronically Signed   By: Harrietta Sherry M.D.   On: 07/08/2024 12:51    Scheduled Meds:  Chlorhexidine  Gluconate Cloth  6 each Topical Daily   heparin   5,000 Units Subcutaneous Q8H   insulin  aspart  0-15 Units Subcutaneous TID WC   insulin  aspart  0-5 Units Subcutaneous QHS   oxyCODONE   10 mg Oral Q12H   ranolazine   500 mg Oral BID   sodium chloride  flush  10-40 mL Intracatheter Q12H   Continuous Infusions:  sodium chloride  100 mL/hr at 07/09/24 0243   piperacillin -tazobactam (ZOSYN )  IV 3.375 g (07/09/24 0617)   vancomycin        LOS: 1 day  MDM: Patient is high risk for one or more organ failure.  They necessitate ongoing hospitalization for continued IV therapies and subsequent lab monitoring. Total time spent interpreting labs and vitals, reviewing the medical record, coordinating care amongst consultants and care team members, directly assessing and discussing care with the patient and/or family: 55 min  Laree Lock, MD [facility]  To contact the attending physician between 7A-7P please use Epic Chat. To contact the covering physician during after hours 7P-7A, please review Amion.  07/09/2024, 9:31 AM   *This document has been created with the assistance of dictation software. Please excuse typographical errors. *

## 2024-07-09 NOTE — Progress Notes (Signed)
 [facility], Room 239 [facility] Liaison Note  Received request from [place], [place], Transitions of Care Manager, for hospice services at home after discharge.  Spoke with patient and spouse to initiate education related to hospice philosophy, services, and team approach to care.  Patient and spouse verbalized understanding of information given.     DME needs discussed.  Patient has the following equipment in the home: walker.    Patient/family requests the following equipment in the home: hospital bed, over bed table, BSC and WC.    The address has been verified and is correct in the chart.  [employer]cious [place] and phone number [phone] is the family contact to arrange time of equipment delivery.    Please send signed and completed DNR home with the patient/family.  Please provide prescriptions at discharge as needed to ensure ongoing symptom management.   [facility] information and contact numbers given to spouse.  Above information shared with  Transitions of Care Manager.     Please call with any Hospice related questions or concerns.  Thank you for the opportunity to participate in this patient's care.  Marinell Nova, [facility] Liaison [phone]

## 2024-07-10 DIAGNOSIS — M549 Dorsalgia, unspecified: Secondary | ICD-10-CM

## 2024-07-10 DIAGNOSIS — A419 Sepsis, unspecified organism: Secondary | ICD-10-CM | POA: Diagnosis not present

## 2024-07-10 DIAGNOSIS — Z789 Other specified health status: Secondary | ICD-10-CM | POA: Diagnosis not present

## 2024-07-10 DIAGNOSIS — Z515 Encounter for palliative care: Secondary | ICD-10-CM | POA: Diagnosis not present

## 2024-07-10 DIAGNOSIS — G8929 Other chronic pain: Secondary | ICD-10-CM

## 2024-07-10 DIAGNOSIS — K632 Fistula of intestine: Secondary | ICD-10-CM | POA: Diagnosis not present

## 2024-07-10 DIAGNOSIS — Z66 Do not resuscitate: Secondary | ICD-10-CM | POA: Diagnosis not present

## 2024-07-10 LAB — BASIC METABOLIC PANEL WITH GFR
Anion gap: 11 (ref 5–15)
BUN: 16 mg/dL (ref 8–23)
CO2: 20 mmol/L — ABNORMAL LOW (ref 22–32)
Calcium: 7.8 mg/dL — ABNORMAL LOW (ref 8.9–10.3)
Chloride: 106 mmol/L (ref 98–111)
Creatinine, Ser: 0.65 mg/dL (ref 0.44–1.00)
GFR, Estimated: >60 mL/min (ref >60)
Glucose, Bld: 104 mg/dL — ABNORMAL HIGH (ref 70–99)
Potassium: 3.1 mmol/L — ABNORMAL LOW (ref 3.5–5.1)
Sodium: 137 mmol/L (ref 135–145)

## 2024-07-10 LAB — GLUCOSE, CAPILLARY
Glucose-Capillary: 105 mg/dL — ABNORMAL HIGH (ref 70–99)
Glucose-Capillary: 149 mg/dL — ABNORMAL HIGH (ref 70–99)
Glucose-Capillary: 131 mg/dL — ABNORMAL HIGH (ref 70–99)
Glucose-Capillary: 107 mg/dL — ABNORMAL HIGH (ref 70–99)
Glucose-Capillary: 108 mg/dL — ABNORMAL HIGH (ref 70–99)

## 2024-07-10 LAB — CBC
HCT: 24.4 % — ABNORMAL LOW (ref 36.0–46.0)
Hemoglobin: 7.3 g/dL — ABNORMAL LOW (ref 12.0–15.0)
MCH: 25.4 pg — ABNORMAL LOW (ref 26.0–34.0)
MCHC: 29.9 g/dL — ABNORMAL LOW (ref 30.0–36.0)
MCV: 85 fL (ref 80.0–100.0)
Platelets: 327 K/uL (ref 150–400)
RBC: 2.87 MIL/uL — ABNORMAL LOW (ref 3.87–5.11)
RDW: 17.4 % — ABNORMAL HIGH (ref 11.5–15.5)
WBC: 12.2 K/uL — ABNORMAL HIGH (ref 4.0–10.5)
nRBC: 0 % (ref 0.0–0.2)

## 2024-07-10 LAB — MAGNESIUM: Magnesium: 1.4 mg/dL — ABNORMAL LOW (ref 1.7–2.4)

## 2024-07-10 MED ORDER — POTASSIUM CHLORIDE 20 MEQ PO PACK
40.0000 meq | PACK | ORAL | Status: AC
  Administered 2024-07-10 (×2): 40 meq via ORAL
  Filled 2024-07-10 (×2): qty 2

## 2024-07-10 MED ORDER — ENSURE PLUS HIGH PROTEIN PO LIQD
237.0000 mL | Freq: Two times a day (BID) | ORAL | Status: DC
  Administered 2024-07-10 – 2024-07-12 (×6): 237 mL via ORAL

## 2024-07-10 MED ORDER — MAGNESIUM SULFATE 2 GM/50ML IV SOLN
2.0000 g | Freq: Once | INTRAVENOUS | Status: AC
  Administered 2024-07-10: 2 g via INTRAVENOUS
  Filled 2024-07-10: qty 50

## 2024-07-10 MED ORDER — OXYCODONE HCL 5 MG PO TABS
5.0000 mg | ORAL_TABLET | ORAL | Status: DC | PRN
  Administered 2024-07-10 – 2024-07-12 (×5): 5 mg via ORAL
  Filled 2024-07-10 (×3): qty 1

## 2024-07-10 NOTE — Plan of Care (Signed)
  Problem: Education: Goal: Ability to describe self-care measures that may prevent or decrease complications (Diabetes Survival Skills Education) will improve Outcome: Progressing   Problem: Coping: Goal: Ability to adjust to condition or change in health will improve Outcome: Progressing   Problem: Fluid Volume: Goal: Ability to maintain a balanced intake and output will improve Outcome: Progressing   Problem: Health Behavior/Discharge Planning: Goal: Ability to identify and utilize available resources and services will improve Outcome: Progressing Goal: Ability to manage health-related needs will improve Outcome: Progressing   Problem: Metabolic: Goal: Ability to maintain appropriate glucose levels will improve Outcome: Progressing   Problem: Nutritional: Goal: Maintenance of adequate nutrition will improve Outcome: Progressing   Problem: Skin Integrity: Goal: Risk for impaired skin integrity will decrease Outcome: Progressing   Problem: Fluid Volume: Goal: Hemodynamic stability will improve Outcome: Progressing

## 2024-07-10 NOTE — Plan of Care (Signed)
 Plan to discharge home hospice.  Patient did well and pain managed with PRN and scheduled pain medications. Palliative NP checking in regularly to be sure PRN pain control was adequate.

## 2024-07-10 NOTE — Progress Notes (Signed)
 [employer], contracting [facility]) Hospital Liaison Note  Follow up on new hospice referral. Patient with no discharge plans today.  DME needs:  hospital bed, over bed table, BSC and WC to be ordered and delivered to address in chart on Monday and will be in place before hospital discharge.  Please do not hesitate to call with any hospice related questions or concerns.  Saddie HILARIO Na, RN Nurse Liaison [phone]

## 2024-07-10 NOTE — Progress Notes (Signed)
 Palliative Care Progress Note, Assessment & Plan   Patient Name: Jessica Stout       Date: 07/10/2024 DOB: 1951/10/07  Age: 73 y.o. MRN#: [id number] Attending Physician: Jerelene Critchley, MD Primary Care Physician: Corlis Honor BROCKS, MD Admit Date: 07/08/2024  Subjective: Pt complains of 7/10 back pain/abdominal pain and her morning pain medication did not help. Did not sleep well last night. Endorse nausea, mild SOB and mild CP today. Husband shares she was able to eat some peaches and applesauce for breakfast.   HPI: 73 y.o. female with past medical history significant for colon cancer post chemotherapy, partial gastrectomy, R hemicolectomy, HTN, IDDM, HLD, CVA, depression, anxiety and COPD/asthma. Patient presented to ED 07/08/2024 c/o weakness and abdominal pain.     Summary of counseling/coordination of care: Extensive chart review completed prior to meeting patient including labs, vital signs, imaging, progress notes, orders, and available advanced directive documents from current and previous encounters.   After reviewing the patient's chart and assessing the patient at bedside, I spoke with patient in regards to symptom management and goals of care.   Ill-appearing, elderly female lying in bed. She is alert and oriented to self, time, location and situation. Jessica Stout, spouse, at bedside. Patient is able to engage in conversation and make her wishes known. Even, unlabored respirations. She is in no distress.  Patient shares she has been in pain all night. Patient was advised that she has pain medication ordered but she just needs to ask for it. Discussed with patient and husband if current pain regimen is not effective, we can add IV pain meds. Both are in agreement. Jessica Stout confirms that DME will be delivered to  their home. Both patient and husband confirm the plan is to go home with hospice services.   Therapeutic silence and active listening provided for patient and spouse to share their thoughts and emotions regarding current medical situation.  Emotional support provided.  Physical Exam Vitals reviewed.  Constitutional:      General: She is not in acute distress.    Appearance: She is ill-appearing.  HENT:     Head: Normocephalic and atraumatic.     Mouth/Throat:     Mouth: Mucous membranes are dry.  Pulmonary:     Effort: Pulmonary effort is normal. No respiratory distress.  Skin:    General: Skin is warm and dry.     Coloration: Skin is pale.  Neurological:     Mental Status: She is alert and oriented to person, place, and time.     Motor: Weakness present.  Psychiatric:        Mood and Affect: Mood normal.        Behavior: Behavior normal.        Thought Content: Thought content normal.        Judgment: Judgment normal.            Recommendations/Plan: Continue DNR/DNI status as previously documented    Continue current supportive interventions Home with hospice at discharge PMT to follow for symptom management   Total Time 50 minutes   Discussed plan of care with Jessica Stout, Primary RN.   Time spent includes: Detailed review of medical records (labs, imaging, vital  signs), medically appropriate exam (mental status, respiratory, cardiac, skin), discussed with treatment team, counseling and educating patient, family and staff, documenting clinical information, medication management and coordination of care.     Jessica Stout, Jessica Stout- [facility] Palliative Medicine Team  07/10/2024 9:59 AM  Office [phone]  Pager [phone]

## 2024-07-10 NOTE — Progress Notes (Addendum)
 PROGRESS NOTE    Jessica Stout  FMW:[id number] DOB: 1951/02/15 DOA: 07/08/2024 PCP: Corlis Honor BROCKS, MD  Chief Complaint  Patient presents with   Weakness    Hospital Course:  Jessica Stout is a 73 year old female with history of colon cancer status postchemotherapy, partial gastrectomy, right hemicolectomy, hypertension, insulin -dependent diabetes mellitus, hyperlipidemia, CVA, depression, anxiety, COPD/asthma, presented with chief concerns of weakness and abdominal pain. Patient is admitted with concerns for sepsis likely due to intra-abdominal source.  On IV antibiotics.  Seen by surgery, no intervention recommended. Seen by palliative, plan for discharge to home with home hospice when medically ready.  Hospital course as below   Subjective: Patient was examined at the bedside, spouse present at the bedside. Discussed clinical course and long term prognosis Continues to have pain, suggested to ask for prn meds. Anticipate discharge home with home hospice once pain controlled   Objective: Vitals:   07/10/24 0010 07/10/24 0503 07/10/24 0825 07/10/24 1119  BP: (!) 156/73 (!) 145/68 132/80 131/83  Pulse: 96 88 89 87  Resp: 19 20    Temp: 98.3 F (36.8 C) 97.8 F (36.6 C) 98.1 F (36.7 C) 97.7 F (36.5 C)  TempSrc:      SpO2: 99% 96% 97% 100%  Weight:      Height:        Intake/Output Summary (Last 24 hours) at 07/10/2024 1543 Last data filed at 07/10/2024 1300 Gross per 24 hour  Intake 636.68 ml  Output 500 ml  Net 136.68 ml   Filed Weights   07/08/24 2000  Weight: 48.5 kg    Examination: Constitutional: appears frail, cachectic, chronically ill, weak Neck: normal, supple, no masses, no thyromegaly Respiratory: clear to auscultation bilaterally, no wheezing, no crackles. Normal respiratory effort. No accessory muscle use.  Cardiovascular: Regular rate and rhythm, no murmurs / rubs / gallops. No extremity edema. 2+ pedal pulses. No carotid bruits.  Abdomen: Generalized  tenderness, no masses palpated, no hepatosplenomegaly. Bowel sounds positive.  Musculoskeletal: no clubbing / cyanosis. No joint deformity upper and lower extremities. Good ROM, no contractures, no atrophy. Normal muscle tone.  Skin: no rashes, lesions, ulcers. No induration Neurologic: Sensation intact. Strength 5/5 in all 4.   Assessment & Plan:  Principal Problem:   Sepsis (HCC) Active Problems:   Abdominal pain   Hyperlipidemia   Anxiety and depression   Intra-abdominal abscess (HCC)   HTN (hypertension)   Colon cancer (HCC)   Diabetes mellitus type 2, controlled, without complications (HCC)   Hypomagnesemia   Leukocytosis   Enterocolic fistula   Failure to thrive in adult   Sepsis Intra-abdominal abscess  - Suspect source is intra-abdominal in setting of enteroenteric and enterocolic fistula with likely abscess - Leukocytosis improving - IV Zosyn , discontinue Vancomycin , plan to discharge on po Augmentin  - s/p IV fluids - General Surgery has been consulted, appreciate recs, no improvement with any surgical intervention - Blood cultures NGTD   Abdominal pain Secondary to cancer related pain PDMP reviewed Patient takes home OxyContin  10 mg twice a day Add oxycodone  5mg  q6 prn   HTN (hypertension) Hydralazine  5 mg IV every 6 hours as needed for SBP greater 170   Failure to thrive in adult GOC Palliative care consulted - plan for hospice at home when ready medically - likely tomorrow   Hypomagnesemia Hypokalemia Monitor and replete as needed  Normocytic anemia - Hb 7.3, no evidence of bleeding   Diabetes mellitus type 2, controlled, without complications (HCC) Insulin  SSI with  at bedtime ordered  Chronic CHF with midrange ejection fraction (congestive heart failure) (HCC): Compensated. 2D echo on 12/25/2022 showed EF of 45 to 50% with grade 1 diastolic dysfunction   DVT prophylaxis: Heparin  SQ   Code Status: Limited: Do not attempt resuscitation (DNR)  -DNR-LIMITED -Do Not Intubate/DNI  Disposition:  Home with hospice  Consultants:  General Surgery Palliative care  Procedures:  None  Antimicrobials:  Anti-infectives (From admission, onward)    Start     Dose/Rate Route Frequency Ordered Stop   07/09/24 1200  vancomycin  (VANCOREADY) IVPB 750 mg/150 mL  Status:  Discontinued        750 mg 150 mL/hr over 60 Minutes Intravenous Every 24 hours 07/08/24 1442 07/10/24 0704   07/08/24 2000  piperacillin -tazobactam (ZOSYN ) IVPB 3.375 g        3.375 g 12.5 mL/hr over 240 Minutes Intravenous Every 8 hours 07/08/24 1436     07/08/24 1230  piperacillin -tazobactam (ZOSYN ) IVPB 3.375 g        3.375 g 12.5 mL/hr over 240 Minutes Intravenous  Once 07/08/24 1224 07/08/24 1739   07/08/24 1230  vancomycin  (VANCOCIN ) IVPB 1000 mg/200 mL premix        1,000 mg 200 mL/hr over 60 Minutes Intravenous  Once 07/08/24 1228 07/08/24 1455       Data Reviewed: I have personally reviewed following labs and imaging studies CBC: Recent Labs  Lab 07/08/24 1127 07/09/24 0350 07/10/24 0441  WBC 20.2* 12.6* 12.2*  NEUTROABS 18.0*  --   --   HGB 9.0* 7.6* 7.3*  HCT 29.6* 24.7* 24.4*  MCV 83.6 83.7 85.0  PLT 521* 351 327   Basic Metabolic Panel: Recent Labs  Lab 07/08/24 1127 07/09/24 0350 07/10/24 0440 07/10/24 0441  NA 143 143  --  137  K 4.4 4.1  --  3.1*  CL 105 109  --  106  CO2 22 25  --  20*  GLUCOSE 180* 103*  --  104*  BUN 25* 19  --  16  CREATININE 0.95 0.77  --  0.65  CALCIUM  9.1 8.2*  --  7.8*  MG 1.4* 1.7 1.4*  --   PHOS 4.5  --   --   --    GFR: Estimated Creatinine Clearance: 46.5 mL/min (by C-G formula based on SCr of 0.65 mg/dL). Liver Function Tests: Recent Labs  Lab 07/08/24 1127  AST 19  ALT 13  ALKPHOS 53  BILITOT 0.7  PROT 6.4*  ALBUMIN 2.8*   CBG: Recent Labs  Lab 07/09/24 1136 07/09/24 1630 07/09/24 2157 07/10/24 0831 07/10/24 1124  GLUCAP 102* 110* 93 105* 149*    Recent Results (from the past  240 hours)  Blood Culture (routine x 2)     Status: None (Preliminary result)   Collection Time: 07/08/24 12:43 PM   Specimen: Porta Cath; Blood  Result Value Ref Range Status   Specimen Description PORTA CATH  Final   Special Requests   Final    BOTTLES DRAWN AEROBIC AND ANAEROBIC Blood Culture results may not be optimal due to an inadequate volume of blood received in culture bottles   Culture   Final    NO GROWTH 2 DAYS Performed at [facility], [address] [place], [place] [address]    Report Status PENDING  Incomplete  Blood Culture (routine x 2)     Status: None (Preliminary result)   Collection Time: 07/08/24 12:43 PM   Specimen: BLOOD RIGHT ARM  Result Value  Ref Range Status   Specimen Description BLOOD RIGHT ARM  Final   Special Requests   Final    BOTTLES DRAWN AEROBIC AND ANAEROBIC Blood Culture results may not be optimal due to an inadequate volume of blood received in culture bottles   Culture   Final    NO GROWTH 2 DAYS Performed at [facility], [address] [place], [place] [address]    Report Status PENDING  Incomplete     Radiology Studies: No results found.   Scheduled Meds:  atorvastatin   40 mg Oral Daily   Chlorhexidine  Gluconate Cloth  6 each Topical Daily   feeding supplement  237 mL Oral BID BM   fenofibrate   54 mg Oral Daily   heparin   5,000 Units Subcutaneous Q8H   insulin  aspart  0-15 Units Subcutaneous TID WC   insulin  aspart  0-5 Units Subcutaneous QHS   metoprolol  succinate  50 mg Oral Daily   oxyCODONE   10 mg Oral Q12H   pantoprazole   40 mg Oral Daily   ranolazine   500 mg Oral BID   senna-docusate  1 tablet Oral BID   sodium chloride  flush  10-40 mL Intracatheter Q12H   Continuous Infusions:  piperacillin -tazobactam (ZOSYN )  IV 3.375 g (07/10/24 1448)     LOS: 2 days  MDM: Patient is high risk for one or more organ failure.  They necessitate ongoing hospitalization for continued IV therapies and subsequent  lab monitoring. Total time spent interpreting labs and vitals, reviewing the medical record, coordinating care amongst consultants and care team members, directly assessing and discussing care with the patient and/or family: 55 min  Laree Lock, MD [facility]  To contact the attending physician between 7A-7P please use Epic Chat. To contact the covering physician during after hours 7P-7A, please review Amion.  07/10/2024, 3:43 PM   *This document has been created with the assistance of dictation software. Please excuse typographical errors. *

## 2024-07-11 DIAGNOSIS — K632 Fistula of intestine: Secondary | ICD-10-CM | POA: Diagnosis not present

## 2024-07-11 LAB — BASIC METABOLIC PANEL WITH GFR
Anion gap: 10 (ref 5–15)
BUN: 20 mg/dL (ref 8–23)
CO2: 23 mmol/L (ref 22–32)
Calcium: 8.6 mg/dL — ABNORMAL LOW (ref 8.9–10.3)
Chloride: 108 mmol/L (ref 98–111)
Creatinine, Ser: 0.91 mg/dL (ref 0.44–1.00)
GFR, Estimated: >60 mL/min (ref >60)
Glucose, Bld: 131 mg/dL — ABNORMAL HIGH (ref 70–99)
Potassium: 4.2 mmol/L (ref 3.5–5.1)
Sodium: 141 mmol/L (ref 135–145)

## 2024-07-11 LAB — CBC
HCT: 25.3 % — ABNORMAL LOW (ref 36.0–46.0)
Hemoglobin: 7.6 g/dL — ABNORMAL LOW (ref 12.0–15.0)
MCH: 25.5 pg — ABNORMAL LOW (ref 26.0–34.0)
MCHC: 30 g/dL (ref 30.0–36.0)
MCV: 84.9 fL (ref 80.0–100.0)
Platelets: 353 K/uL (ref 150–400)
RBC: 2.98 MIL/uL — ABNORMAL LOW (ref 3.87–5.11)
RDW: 18.1 % — ABNORMAL HIGH (ref 11.5–15.5)
WBC: 12 K/uL — ABNORMAL HIGH (ref 4.0–10.5)
nRBC: 0 % (ref 0.0–0.2)

## 2024-07-11 LAB — GLUCOSE, CAPILLARY
Glucose-Capillary: 103 mg/dL — ABNORMAL HIGH (ref 70–99)
Glucose-Capillary: 161 mg/dL — ABNORMAL HIGH (ref 70–99)
Glucose-Capillary: 95 mg/dL (ref 70–99)
Glucose-Capillary: 135 mg/dL — ABNORMAL HIGH (ref 70–99)

## 2024-07-11 LAB — MAGNESIUM: Magnesium: 2 mg/dL (ref 1.7–2.4)

## 2024-07-11 MED ORDER — POLYETHYLENE GLYCOL 3350 17 G PO PACK
17.0000 g | PACK | Freq: Every day | ORAL | Status: DC
  Administered 2024-07-11 (×2): 17 g via ORAL
  Filled 2024-07-11: qty 1

## 2024-07-11 NOTE — Progress Notes (Addendum)
 PROGRESS NOTE    Jessica Stout  FMW:[id number] DOB: 22-Feb-1951 DOA: 07/08/2024 PCP: Corlis Honor BROCKS, MD  Chief Complaint  Patient presents with   Weakness    Hospital Course:  Jessica Stout is a 73 year old female with history of colon cancer status postchemotherapy, partial gastrectomy, right hemicolectomy, hypertension, insulin -dependent diabetes mellitus, hyperlipidemia, CVA, depression, anxiety, COPD/asthma, presented with chief concerns of weakness and abdominal pain. Patient is admitted with concerns for sepsis likely due to intra-abdominal source.  On IV antibiotics.  Seen by surgery, no intervention recommended. Seen by palliative, plan for discharge to home with home hospice. Hospital course as below   Subjective: Patient was examined at the bedside, spouse present at the bedside. States she feels sad and depressed but does not want to say much as she understands the prognosis Pain controlled, medically ready. Plan to discharge home tomorrow with home hospice once everything set up for her at home   Objective: Vitals:   07/11/24 0412 07/11/24 0714 07/11/24 1058 07/11/24 1504  BP: 127/68 135/75 116/62 (!) 108/58  Pulse: 77 78 78 76  Resp: 18 18 18 17   Temp: 97.9 F (36.6 C) 98.6 F (37 C) 97.9 F (36.6 C) 97.9 F (36.6 C)  TempSrc:      SpO2: 96% 99% 98% 98%  Weight:      Height:        Intake/Output Summary (Last 24 hours) at 07/11/2024 1627 Last data filed at 07/11/2024 1300 Gross per 24 hour  Intake 1137 ml  Output --  Net 1137 ml   Filed Weights   07/08/24 2000  Weight: 48.5 kg    Examination: Constitutional: appears frail, cachectic, chronically ill, weak Neck: normal, supple, no masses, no thyromegaly Respiratory: clear to auscultation bilaterally, no wheezing, no crackles. Normal respiratory effort. No accessory muscle use.  Cardiovascular: Regular rate and rhythm, no murmurs / rubs / gallops. No extremity edema. 2+ pedal pulses. No carotid bruits.   Abdomen: Generalized tenderness, no masses palpated, no hepatosplenomegaly. Bowel sounds positive.  Musculoskeletal: no clubbing / cyanosis. No joint deformity upper and lower extremities. Good ROM, no contractures, no atrophy. Normal muscle tone.  Skin: no rashes, lesions, ulcers. No induration Neurologic: Sensation intact. Strength 5/5 in all 4.   Assessment & Plan:  Principal Problem:   Sepsis (HCC) Active Problems:   Abdominal pain   Hyperlipidemia   Anxiety and depression   Intra-abdominal abscess (HCC)   HTN (hypertension)   Colon cancer (HCC)   Diabetes mellitus type 2, controlled, without complications (HCC)   Hypomagnesemia   Leukocytosis   Enterocolic fistula   Failure to thrive in adult   Sepsis Intra-abdominal abscess  - Suspect source is intra-abdominal in setting of enteroenteric and enterocolic fistula with likely abscess - Leukocytosis improving - IV Zosyn , discontinue Vancomycin , plan to discharge on po Augmentin  tomorrow - s/p IV fluids - General Surgery has been consulted, appreciate recs, no improvement with any surgical intervention - Blood cultures NGTD   Abdominal pain Secondary to cancer related pain PDMP reviewed Patient takes home OxyContin  10 mg twice a day Add oxycodone  5mg  q6 prn   HTN (hypertension) Hydralazine  5 mg IV every 6 hours as needed for SBP greater 170   Failure to thrive in adult GOC Palliative care consulted - plan for hospice at home - likely tomorrow   Hypomagnesemia - resolved Hypokalemia Monitor and replete as needed  Normocytic anemia - Hb 7.6, no evidence of bleeding   Diabetes mellitus type 2, controlled,  without complications (HCC) Insulin  SSI with at bedtime ordered  Chronic CHF with midrange ejection fraction (congestive heart failure) (HCC): Compensated. 2D echo on 12/25/2022 showed EF of 45 to 50% with grade 1 diastolic dysfunction   DVT prophylaxis: Heparin  SQ   Code Status: Limited: Do not attempt  resuscitation (DNR) -DNR-LIMITED -Do Not Intubate/DNI  Disposition:  Home with hospice  Consultants:  General Surgery Palliative care  Procedures:  None  Antimicrobials:  Anti-infectives (From admission, onward)    Start     Dose/Rate Route Frequency Ordered Stop   07/09/24 1200  vancomycin  (VANCOREADY) IVPB 750 mg/150 mL  Status:  Discontinued        750 mg 150 mL/hr over 60 Minutes Intravenous Every 24 hours 07/08/24 1442 07/10/24 0704   07/08/24 2000  piperacillin -tazobactam (ZOSYN ) IVPB 3.375 g        3.375 g 12.5 mL/hr over 240 Minutes Intravenous Every 8 hours 07/08/24 1436     07/08/24 1230  piperacillin -tazobactam (ZOSYN ) IVPB 3.375 g        3.375 g 12.5 mL/hr over 240 Minutes Intravenous  Once 07/08/24 1224 07/08/24 1739   07/08/24 1230  vancomycin  (VANCOCIN ) IVPB 1000 mg/200 mL premix        1,000 mg 200 mL/hr over 60 Minutes Intravenous  Once 07/08/24 1228 07/08/24 1455       Data Reviewed: I have personally reviewed following labs and imaging studies CBC: Recent Labs  Lab 07/08/24 1127 07/09/24 0350 07/10/24 0441 07/11/24 0449  WBC 20.2* 12.6* 12.2* 12.0*  NEUTROABS 18.0*  --   --   --   HGB 9.0* 7.6* 7.3* 7.6*  HCT 29.6* 24.7* 24.4* 25.3*  MCV 83.6 83.7 85.0 84.9  PLT 521* 351 327 353   Basic Metabolic Panel: Recent Labs  Lab 07/08/24 1127 07/09/24 0350 07/10/24 0440 07/10/24 0441 07/11/24 0449  NA 143 143  --  137 141  K 4.4 4.1  --  3.1* 4.2  CL 105 109  --  106 108  CO2 22 25  --  20* 23  GLUCOSE 180* 103*  --  104* 131*  BUN 25* 19  --  16 20  CREATININE 0.95 0.77  --  0.65 0.91  CALCIUM  9.1 8.2*  --  7.8* 8.6*  MG 1.4* 1.7 1.4*  --  2.0  PHOS 4.5  --   --   --   --    GFR: Estimated Creatinine Clearance: 40.9 mL/min (by C-G formula based on SCr of 0.91 mg/dL). Liver Function Tests: Recent Labs  Lab 07/08/24 1127  AST 19  ALT 13  ALKPHOS 53  BILITOT 0.7  PROT 6.4*  ALBUMIN 2.8*   CBG: Recent Labs  Lab 07/10/24 2208  07/10/24 2217 07/11/24 0735 07/11/24 1127 07/11/24 1535  GLUCAP 108* 107* 103* 161* 95    Recent Results (from the past 240 hours)  Blood Culture (routine x 2)     Status: None (Preliminary result)   Collection Time: 07/08/24 12:43 PM   Specimen: Porta Cath; Blood  Result Value Ref Range Status   Specimen Description PORTA CATH  Final   Special Requests   Final    BOTTLES DRAWN AEROBIC AND ANAEROBIC Blood Culture results may not be optimal due to an inadequate volume of blood received in culture bottles   Culture   Final    NO GROWTH 3 DAYS Performed at [facility], [address] [place], [place] [address]    Report Status PENDING  Incomplete  Blood Culture (routine x 2)     Status: None (Preliminary result)   Collection Time: 07/08/24 12:43 PM   Specimen: BLOOD RIGHT ARM  Result Value Ref Range Status   Specimen Description BLOOD RIGHT ARM  Final   Special Requests   Final    BOTTLES DRAWN AEROBIC AND ANAEROBIC Blood Culture results may not be optimal due to an inadequate volume of blood received in culture bottles   Culture   Final    NO GROWTH 3 DAYS Performed at [facility], [address], [place], [place] [address]    Report Status PENDING  Incomplete     Radiology Studies: No results found.   Scheduled Meds:  atorvastatin   40 mg Oral Daily   Chlorhexidine  Gluconate Cloth  6 each Topical Daily   feeding supplement  237 mL Oral BID BM   fenofibrate   54 mg Oral Daily   heparin   5,000 Units Subcutaneous Q8H   insulin  aspart  0-15 Units Subcutaneous TID WC   insulin  aspart  0-5 Units Subcutaneous QHS   metoprolol  succinate  50 mg Oral Daily   oxyCODONE   10 mg Oral Q12H   pantoprazole   40 mg Oral Daily   polyethylene glycol  17 g Oral Daily   ranolazine   500 mg Oral BID   senna-docusate  1 tablet Oral BID   sodium chloride  flush  10-40 mL Intracatheter Q12H   Continuous Infusions:  piperacillin -tazobactam (ZOSYN )  IV 3.375 g (07/11/24  1318)     LOS: 3 days  MDM: Patient is high risk for one or more organ failure.  They necessitate ongoing hospitalization for continued IV therapies and subsequent lab monitoring. Total time spent interpreting labs and vitals, reviewing the medical record, coordinating care amongst consultants and care team members, directly assessing and discussing care with the patient and/or family: 35 min  Laree Lock, MD [facility]  To contact the attending physician between 7A-7P please use Epic Chat. To contact the covering physician during after hours 7P-7A, please review Amion.  07/11/2024, 4:27 PM   *This document has been created with the assistance of dictation software. Please excuse typographical errors. *

## 2024-07-11 NOTE — Plan of Care (Signed)
  Problem: Activity: Goal: Risk for activity intolerance will decrease Outcome: Progressing   Problem: Nutrition: Goal: Adequate nutrition will be maintained Outcome: Progressing   Problem: Elimination: Goal: Will not experience complications related to urinary retention Outcome: Progressing   Problem: Pain Managment: Goal: General experience of comfort will improve and/or be controlled Outcome: Progressing   Problem: Safety: Goal: Ability to remain free from injury will improve Outcome: Progressing   Problem: Skin Integrity: Goal: Risk for impaired skin integrity will decrease Outcome: Progressing

## 2024-07-11 NOTE — Progress Notes (Addendum)
 [facility] Liaison Note  DME company was unable to reach patient's spouse by phone today to schedule DME delivery.  [facility] gave patient's spouse the telephone number to the DME company and asked that he call them today.  He reported that he will call today to schedule delivery for tomorrow since he has to get the house set up for patient.  Hospital team aware.    Please call with any hospice related questions or concerns.  Thank you for the opportunity to participate in this patient's care.    [facility] Liaison [phone]

## 2024-07-11 NOTE — Plan of Care (Signed)

## 2024-07-11 NOTE — Care Management Important Message (Signed)
 Important Message  Patient Details  Name: Jessica Stout MRN: [id number] Date of Birth: March 01, 1951   Important Message Given:  Yes - Medicare IM     [name] 07/11/2024, 12:39 PM

## 2024-07-12 ENCOUNTER — Other Ambulatory Visit: Payer: Self-pay

## 2024-07-12 ENCOUNTER — Inpatient Hospital Stay: Admitting: Hospice and Palliative Medicine

## 2024-07-12 ENCOUNTER — Inpatient Hospital Stay: Admitting: Oncology

## 2024-07-12 ENCOUNTER — Inpatient Hospital Stay

## 2024-07-12 ENCOUNTER — Encounter: Payer: Self-pay | Admitting: Oncology

## 2024-07-12 DIAGNOSIS — R627 Adult failure to thrive: Secondary | ICD-10-CM | POA: Diagnosis not present

## 2024-07-12 DIAGNOSIS — K632 Fistula of intestine: Secondary | ICD-10-CM | POA: Diagnosis not present

## 2024-07-12 LAB — GLUCOSE, CAPILLARY
Glucose-Capillary: 122 mg/dL — ABNORMAL HIGH (ref 70–99)
Glucose-Capillary: 89 mg/dL (ref 70–99)

## 2024-07-12 MED ORDER — HEPARIN SOD (PORK) LOCK FLUSH 100 UNIT/ML IV SOLN
500.0000 [IU] | INTRAVENOUS | Status: AC | PRN
  Administered 2024-07-12 (×2): 500 [IU]

## 2024-07-12 MED ORDER — LIDOCAINE 5 % EX PTCH
1.0000 | MEDICATED_PATCH | CUTANEOUS | Status: DC
  Administered 2024-07-12 (×2): 1 via TRANSDERMAL
  Filled 2024-07-12: qty 1

## 2024-07-12 MED ORDER — POLYETHYLENE GLYCOL 3350 17 G PO PACK
17.0000 g | PACK | Freq: Every day | ORAL | 0 refills | Status: DC
  Filled 2024-07-12: qty 14, 14d supply, fill #0

## 2024-07-12 MED ORDER — POLYETHYLENE GLYCOL 3350 17 G PO PACK: 17.0000 g | PACK | Freq: Every day | ORAL | 0 refills | Status: DC

## 2024-07-12 MED ORDER — AMOXICILLIN-POT CLAVULANATE 875-125 MG PO TABS
1.0000 | ORAL_TABLET | Freq: Two times a day (BID) | ORAL | 0 refills | Status: DC
  Filled 2024-07-12: qty 6, 3d supply, fill #0

## 2024-07-12 MED ORDER — ONDANSETRON HCL 8 MG PO TABS
8.0000 mg | ORAL_TABLET | Freq: Three times a day (TID) | ORAL | 0 refills | Status: DC | PRN
  Filled 2024-07-12: qty 20, 7d supply, fill #0

## 2024-07-12 MED ORDER — ONDANSETRON HCL 8 MG PO TABS: 8.0000 mg | ORAL_TABLET | Freq: Three times a day (TID) | ORAL | 0 refills | Status: DC | PRN

## 2024-07-12 MED ORDER — AMOXICILLIN-POT CLAVULANATE 875-125 MG PO TABS: 1.0000 | ORAL_TABLET | Freq: Two times a day (BID) | ORAL | 0 refills | Status: AC

## 2024-07-12 MED ORDER — LIDOCAINE 5 % EX PTCH
1.0000 | MEDICATED_PATCH | CUTANEOUS | 0 refills | Status: DC
  Filled 2024-07-12: qty 15, 15d supply, fill #0

## 2024-07-12 MED ORDER — OXYCODONE HCL 5 MG PO TABS
5.0000 mg | ORAL_TABLET | Freq: Four times a day (QID) | ORAL | 0 refills | Status: DC | PRN
Start: 2024-07-12 — End: 2024-07-12
  Filled 2024-07-12: qty 10, 3d supply, fill #0

## 2024-07-12 MED ORDER — LIDOCAINE 5 % EX PTCH: 1.0000 | MEDICATED_PATCH | CUTANEOUS | 0 refills | Status: DC

## 2024-07-12 MED ORDER — OXYCODONE HCL 5 MG PO TABS: 5.0000 mg | ORAL_TABLET | Freq: Four times a day (QID) | ORAL | 0 refills | Status: DC | PRN

## 2024-07-12 NOTE — TOC Transition Note (Signed)
 Transition of Care [facility]) - Discharge Note   Patient Details  Name: Jessica Stout MRN: [id number] Date of Birth: 1951/09/13  [facility]) CM/SW Contact:  Lauraine JAYSON Carpen, LCSW Phone Number: 07/12/2024, 1:50 PM   Clinical Narrative:   Patient has orders to discharge home with hospice today. [facility] liaison is aware. CSW has arranged [employer] and she is 5th on the list. No further concerns. CSW signing off.  Final next level of care: Home w Hospice Care Barriers to Discharge: Barriers Resolved   Patient Goals and CMS Choice            Discharge Placement                Patient to be transferred to facility by: [facility] Name of family member notified: Lonie Rummell Patient and family notified of of transfer: 07/12/24  Discharge Plan and Services Additional resources added to the After Visit Summary for                                       Social Drivers of Health (SDOH) Interventions SDOH Screenings   Food Insecurity: No Food Insecurity (07/08/2024)  Housing: Low Risk  (07/08/2024)  Transportation Needs: No Transportation Needs (07/08/2024)  Utilities: Not At Risk (07/08/2024)  Depression (PHQ2-9): Low Risk  (03/11/2022)  Financial Resource Strain: Low Risk  (05/30/2022)   Received from [facility]  Physical Activity: Insufficiently Active (03/27/2022)   Received from [facility]  Social Connections: Unknown (07/08/2024)  Tobacco Use: Medium Risk (07/08/2024)     Readmission Risk Interventions     No data to display

## 2024-07-12 NOTE — TOC Progression Note (Signed)
 Transition of Care [facility]) - Progression Note    Patient Details  Name: Jonte Wollam MRN: [id number] Date of Birth: 1951-06-18  Transition of Care [facility]) CM/SW Contact  Lauraine JAYSON Carpen, LCSW Phone Number: 07/12/2024, 11:14 AM  Clinical Narrative:   Per husband, DME is being delivered now. He will call CSW back once everything is set up so we can figure out a transport time. Address on facesheet is correct. Sent secure chat to MD, RN, and [facility] liaison to notify.  Expected Discharge Plan and Services                                               Social Drivers of Health (SDOH) Interventions SDOH Screenings   Food Insecurity: No Food Insecurity (07/08/2024)  Housing: Low Risk  (07/08/2024)  Transportation Needs: No Transportation Needs (07/08/2024)  Utilities: Not At Risk (07/08/2024)  Depression (PHQ2-9): Low Risk  (03/11/2022)  Financial Resource Strain: Low Risk  (05/30/2022)   Received from [facility]  Physical Activity: Insufficiently Active (03/27/2022)   Received from [facility]  Social Connections: Unknown (07/08/2024)  Tobacco Use: Medium Risk (07/08/2024)    Readmission Risk Interventions     No data to display

## 2024-07-12 NOTE — Plan of Care (Signed)
  Problem: Education: Goal: Ability to describe self-care measures that may prevent or decrease complications (Diabetes Survival Skills Education) will improve Outcome: Adequate for Discharge Goal: Individualized Educational Video(s) Outcome: Adequate for Discharge   Problem: Coping: Goal: Ability to adjust to condition or change in health will improve Outcome: Adequate for Discharge   Problem: Fluid Volume: Goal: Ability to maintain a balanced intake and output will improve Outcome: Adequate for Discharge   Problem: Health Behavior/Discharge Planning: Goal: Ability to identify and utilize available resources and services will improve Outcome: Adequate for Discharge Goal: Ability to manage health-related needs will improve Outcome: Adequate for Discharge   Problem: Metabolic: Goal: Ability to maintain appropriate glucose levels will improve Outcome: Adequate for Discharge   Problem: Nutritional: Goal: Maintenance of adequate nutrition will improve Outcome: Adequate for Discharge Goal: Progress toward achieving an optimal weight will improve Outcome: Adequate for Discharge   Problem: Skin Integrity: Goal: Risk for impaired skin integrity will decrease Outcome: Adequate for Discharge   Problem: Tissue Perfusion: Goal: Adequacy of tissue perfusion will improve Outcome: Adequate for Discharge   Problem: Fluid Volume: Goal: Hemodynamic stability will improve Outcome: Adequate for Discharge   Problem: Clinical Measurements: Goal: Diagnostic test results will improve Outcome: Adequate for Discharge Goal: Signs and symptoms of infection will decrease Outcome: Adequate for Discharge   Problem: Respiratory: Goal: Ability to maintain adequate ventilation will improve Outcome: Adequate for Discharge   Problem: Education: Goal: Knowledge of General Education information will improve Description: Including pain rating scale, medication(s)/side effects and non-pharmacologic  comfort measures Outcome: Adequate for Discharge   Problem: Health Behavior/Discharge Planning: Goal: Ability to manage health-related needs will improve Outcome: Adequate for Discharge   Problem: Clinical Measurements: Goal: Ability to maintain clinical measurements within normal limits will improve Outcome: Adequate for Discharge Goal: Will remain free from infection Outcome: Adequate for Discharge Goal: Diagnostic test results will improve Outcome: Adequate for Discharge Goal: Respiratory complications will improve Outcome: Adequate for Discharge Goal: Cardiovascular complication will be avoided Outcome: Adequate for Discharge   Problem: Activity: Goal: Risk for activity intolerance will decrease Outcome: Adequate for Discharge   Problem: Nutrition: Goal: Adequate nutrition will be maintained Outcome: Adequate for Discharge   Problem: Coping: Goal: Level of anxiety will decrease Outcome: Adequate for Discharge   Problem: Elimination: Goal: Will not experience complications related to bowel motility Outcome: Adequate for Discharge Goal: Will not experience complications related to urinary retention Outcome: Adequate for Discharge   Problem: Pain Managment: Goal: General experience of comfort will improve and/or be controlled Outcome: Adequate for Discharge   Problem: Safety: Goal: Ability to remain free from injury will improve Outcome: Adequate for Discharge   Problem: Skin Integrity: Goal: Risk for impaired skin integrity will decrease Outcome: Adequate for Discharge

## 2024-07-12 NOTE — Discharge Summary (Addendum)
 Physician Discharge Summary   Patient: Jessica Stout MRN: [id number] DOB: 07-23-1951  Admit date:     07/08/2024  Discharge date: 07/12/24  Discharge Physician: Laree Lock   PCP: Corlis Honor BROCKS, MD   Recommendations at discharge:   Follow up with Hospice provider  Discharge Diagnoses: Principal Problem:   Sepsis (HCC) Active Problems:   Abdominal pain   Hyperlipidemia   Anxiety and depression   Intra-abdominal abscess (HCC)   HTN (hypertension)   Colon cancer (HCC)   Diabetes mellitus type 2, controlled, without complications (HCC)   Hypomagnesemia   Leukocytosis   Enterocolic fistula   Failure to thrive in adult  Resolved Problems:   AKI (acute kidney injury) [facility])  Hospital Course: Jessica Stout is a 73 year old female with history of colon cancer status postchemotherapy, partial gastrectomy, right hemicolectomy, hypertension, insulin -dependent diabetes mellitus, hyperlipidemia, CVA, depression, anxiety, COPD/asthma, presented with chief concerns of weakness and abdominal pain, admitted for sepsis likely intra-abdominal in setting of enteroenteric and enterocolic fistula with likely abscess. Seen by General Surgery, no improvement with any surgical intervention. Seen by palliative, plan for discharge to home with home hospice. Hospital course as below   Sepsis - resolved Intra-abdominal abscess  - Suspect source is intra-abdominal in setting of enteroenteric and enterocolic fistula with likely abscess - Leukocytosis improving - Was on IV Zosyn , discharge on po Augmentin  to complete 7 days - s/p IV fluids - General Surgery has been consulted, appreciate recs, no improvement with any surgical intervention - Blood cultures NGTD   Abdominal pain Secondary to cancer related pain PDMP reviewed Patient takes home OxyContin  10 mg twice a day Add oxycodone  5mg  q6 prn, Lidocaine  patch   HTN (hypertension) - Lisinopril held   Failure to thrive in adult GOC Palliative  care consulted - plan for hospice at home   Hypomagnesemia - resolved Hypokalemia   Normocytic anemia   Diabetes mellitus type 2, controlled, without complications (HCC)   Chronic CHF with midrange ejection fraction (congestive heart failure) (HCC): Compensated. 2D echo on 12/25/2022 showed EF of 45 to 50% with grade 1 diastolic dysfunction     Consultants: General Surgery, Palliative Care Procedures performed: None  Disposition: Hospice care at home  Diet recommendation:  Discharge Diet Orders (From admission, onward)     Start     Ordered   07/12/24 0000  Diet general        07/12/24 1312            DISCHARGE MEDICATION: Allergies as of 07/12/2024       Reactions   Codeine Rash, Dermatitis   Happened in 1970s        Medication List     STOP taking these medications    lisinopril 20 MG tablet Commonly known as: ZESTRIL       TAKE these medications    acetaminophen  325 MG tablet Commonly known as: TYLENOL  Take 650 mg by mouth every 6 (six) hours as needed.   albuterol  108 (90 Base) MCG/ACT inhaler Commonly known as: VENTOLIN  HFA Inhale 1-2 puffs into the lungs every 4 (four) hours as needed for wheezing or shortness of breath. Inhale into the lungs.   amoxicillin -clavulanate 875-125 MG tablet Commonly known as: AUGMENTIN  Take 1 tablet by mouth 2 (two) times daily for 3 days.   atorvastatin  40 MG tablet Commonly known as: LIPITOR Take 40 mg by mouth daily.   clonazePAM  1 MG tablet Commonly known as: KLONOPIN  Take 1 mg by mouth 2 (two) times  daily.   fenofibrate  54 MG tablet Take 54 mg by mouth daily.   gabapentin  600 MG tablet Commonly known as: NEURONTIN  Take by mouth 2 (two) times daily.   lidocaine  5 % Commonly known as: LIDODERM  Place 1 patch onto the skin daily. Remove & Discard patch within 12 hours or as directed by MD Start taking on: July 13, 2024   metoprolol  succinate 50 MG 24 hr tablet Commonly known as:  TOPROL -XL Take 50 mg by mouth daily. Take with or immediately following a meal.   naloxone  4 MG/0.1ML Liqd nasal spray kit Commonly known as: NARCAN  Use as needed for accidental opioid overdose and call EMS   OLANZapine  10 MG tablet Commonly known as: ZYPREXA  Take 0.5-1 tablets (5-10 mg total) by mouth at bedtime as needed (nausea).   ondansetron  8 MG tablet Commonly known as: ZOFRAN  Take 1 tablet (8 mg total) by mouth every 8 (eight) hours as needed for nausea or vomiting.   oxyCODONE  10 mg 12 hr tablet Commonly known as: OXYCONTIN  Take 1 tablet (10 mg total) by mouth every 12 (twelve) hours. What changed: Another medication with the same name was changed. Make sure you understand how and when to take each.   oxyCODONE  5 MG immediate release tablet Commonly known as: Oxy IR/ROXICODONE  Take 1 tablet (5 mg total) by mouth every 6 (six) hours as needed for severe pain (pain score 7-10) or moderate pain (pain score 4-6). What changed:  when to take this reasons to take this   pantoprazole  40 MG tablet Commonly known as: Protonix  Take 1 tablet (40 mg total) by mouth daily.   polyethylene glycol 17 g packet Commonly known as: MIRALAX  / GLYCOLAX  Take 17 g by mouth daily. Start taking on: July 13, 2024   ranolazine  500 MG 12 hr tablet Commonly known as: RANEXA  Take 500 mg by mouth 2 (two) times daily.   senna-docusate 8.6-50 MG tablet Commonly known as: Senokot-S Take 1 tablet by mouth 2 (two) times daily.   sitaGLIPtin-metformin 50-1000 MG tablet Commonly known as: JANUMET Take 1 tablet by mouth daily.   venlafaxine  XR 75 MG 24 hr capsule Commonly known as: EFFEXOR -XR Take 75 mg by mouth daily.        Discharge Exam: Filed Weights   07/08/24 2000  Weight: 48.5 kg   Constitutional: appears frail, cachectic, chronically ill, weak Neck: normal, supple, no masses, no thyromegaly Respiratory: clear to auscultation bilaterally, no wheezing, no crackles. Normal  respiratory effort. No accessory muscle use.  Cardiovascular: Regular rate and rhythm, no murmurs / rubs / gallops. No extremity edema. 2+ pedal pulses. No carotid bruits.  Abdomen: Generalized tenderness, no masses palpated, no hepatosplenomegaly. Bowel sounds positive.  Musculoskeletal: no clubbing / cyanosis. No joint deformity upper and lower extremities. Good ROM, no contractures, no atrophy. Normal muscle tone.  Skin: no rashes, lesions, ulcers. No induration Neurologic: Sensation intact. Strength 5/5 in all 4  Condition at discharge: poor  The results of significant diagnostics from this hospitalization (including imaging, microbiology, ancillary and laboratory) are listed below for reference.   Imaging Studies: CT ABDOMEN PELVIS W CONTRAST Result Date: 07/08/2024 CLINICAL DATA:  Abdominal pain, prior intra-abdominal abscess, concern for sepsis. History of colon cancer. * Tracking Code: BO * EXAM: CT ABDOMEN AND PELVIS WITH CONTRAST TECHNIQUE: Multidetector CT imaging of the abdomen and pelvis was performed using the standard protocol following bolus administration of intravenous contrast. RADIATION DOSE REDUCTION: This exam was performed according to the departmental dose-optimization program which  includes automated exposure control, adjustment of the mA and/or kV according to patient size and/or use of iterative reconstruction technique. CONTRAST:  OMNIPAQUE  IOHEXOL  300 MG/ML  SOLN COMPARISON:  CT scan abdomen and pelvis from 06/15/2024. FINDINGS: Lower chest: The lung bases are clear. No pleural effusion. The heart is normal in size. No pericardial effusion. Hepatobiliary: The liver is normal in size. Non-cirrhotic configuration. No suspicious mass. These is mild diffuse hepatic steatosis. No intrahepatic or extrahepatic bile duct dilation. There is a 1 cm sized calcified gallstone without imaging signs of acute cholecystitis. Normal gallbladder wall thickness. No pericholecystic  inflammatory changes. Pancreas: Unremarkable. No pancreatic ductal dilatation or surrounding inflammatory changes. Spleen: Within normal limits. No focal lesion. 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 Stable thickening/hyperplasia of right adrenal gland. No suspicious renal mass. There is a 1.3 x 1.6 cm simple cyst arising from the right kidney upper pole. No hydronephrosis. No renal or ureteric calculi. Urinary bladder is under distended, precluding optimal assessment. However, no large mass or stones identified. No perivesical fat stranding. Stomach/Bowel: Redemonstration of postsurgical changes in the distal stomach. There is irregular mild-to-moderate thickening of the distal stomach, which is grossly similar to the prior study. There is associated fat stranding and fluid in this region. There is slight interval decrease in the size of previously seen perigastric fluid collection (series 5, image 29), which contains small amount of nondependent air. Patient is status post right hemicolectomy with ileocolonic anastomosis in the left upper quadrant. There is an additional enteroenteric anastomosis in the left lower abdomen (series 2, image 45). At this level, there is redemonstration of a mildly irregular thick walled bowel loop measuring 3.9 cm in diameter. No significant interval change. There is also a chronically thick walled small bowel loop in the left lower abdomen which communicates with several adjacent small bowel loops as well as extends up to the mesenteric border of the sigmoid colon (series 5, image 30). 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 Vascular/Lymphatic: No ascites or pneumoperitoneum. No abdominal or pelvic lymphadenopathy, by size criteria. No aneurysmal dilation of the major abdominal arteries. There are moderate peripheral atherosclerotic vascular calcifications of the aorta and its major branches.  Reproductive: Not well evaluated on the CT scan exam. However, there is a retroverted uterus with a hyperattenuating lesion in the fundal region, which may represent subserosal leiomyoma, not well evaluated on this exam. No large adnexal mass. Other: Midline surgical scar noted. There is large hernia mesh. The soft tissues and abdominal wall are otherwise unremarkable. Musculoskeletal: No suspicious osseous lesions. There are moderate multilevel degenerative changes in the visualized spine. IMPRESSION: 1. Redemonstration of postsurgical changes in the distal stomach. There is irregular mild-to-moderate thickening of the distal stomach, which is grossly similar to the prior study. There is associated fat stranding and fluid in this region. There is slight interval decrease in the size of previously seen perigastric fluid collection, which contains small amount of nondependent air. 2. Redemonstration of postsurgical changes in the left upper quadrant with redemonstration of a mildly irregular thick walled bowel loop measuring 3.9 cm in diameter. No significant interval change. 3. There is also a chronically thick walled small bowel loop in the left lower abdomen which communicates with several adjacent small bowel loops as well as extends up to the mesenteric border of the sigmoid colon. 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 4. Multiple other nonacute observations, as described above.  Aortic Atherosclerosis (ICD10-I70.0). Electronically Signed   By: Ree Molt M.D.   On: 07/08/2024 13:49   DG Chest Port 1 View Result Date: 07/08/2024 CLINICAL DATA:  Weakness. EXAM: PORTABLE CHEST 1 VIEW COMPARISON:  CTA chest dated 06/15/2024. FINDINGS: The heart size and mediastinal contours are within normal limits. Right chest Port-A-Cath tip overlies the superior cavoatrial junction. Emphysema. No focal consolidation, pleural effusion, or pneumothorax. Remote healed right-sided rib fractures.  No acute osseous abnormality. IMPRESSION: 1. No acute cardiopulmonary findings. 2. Emphysema. Electronically Signed   By: Harrietta Sherry M.D.   On: 07/08/2024 12:51   CT ABDOMEN PELVIS W CONTRAST Result Date: 06/15/2024 CLINICAL DATA:  Stage IV colon cancer. Abdominal pain, back pain and nausea and vomiting. * Tracking Code: BO * EXAM: CT ANGIOGRAPHY CHEST CT ABDOMEN AND PELVIS WITH CONTRAST TECHNIQUE: Multidetector CT imaging of the chest was performed using the standard protocol during bolus administration of intravenous contrast. Multiplanar CT image reconstructions and MIPs were obtained to evaluate the vascular anatomy. Multidetector CT imaging of the abdomen and pelvis was performed using the standard protocol during bolus administration of intravenous contrast. RADIATION DOSE REDUCTION: This exam was performed according to the departmental dose-optimization program which includes automated exposure control, adjustment of the mA and/or kV according to patient size and/or use of iterative reconstruction technique. CONTRAST:  75mL OMNIPAQUE  IOHEXOL  350 MG/ML SOLN COMPARISON:  PET 04/22/2024. FINDINGS: CTA CHEST FINDINGS Cardiovascular: Negative for pulmonary embolus. Right IJ Port-A-Cath terminates in the right atrium. Atherosclerotic calcification of the aorta, aortic valve and coronary arteries. Enlarged pulmonic trunk and heart. No pericardial effusion. Mediastinum/Nodes: No pathologically enlarged mediastinal, hilar or axillary lymph nodes. Esophagus is grossly unremarkable. Lungs/Pleura: Centrilobular and paraseptal emphysema. Vague ground-glass in the lingula (6/82), likely scarring, unchanged from 04/22/2024. Scarring in the superior segment left lower lobe. No pleural fluid. Airway is unremarkable. Musculoskeletal: Healing or healed pathologic fracture of the posterior left fifth rib, as before. Old right prick rib fractures. Review of the MIP images confirms the above findings. CT ABDOMEN and  PELVIS FINDINGS Hepatobiliary: Small cysts in the inferior right hepatic lobe. No specific follow-up necessary. Gallstone. No biliary ductal dilatation. Pancreas: Negative. Spleen: Negative. Adrenals/Urinary Tract: Slight thickening of the medial limb right adrenal gland. Heterogeneous left adrenal mass measures 2.5 x 3.7 cm, previously characterized as a myelolipoma. No specific follow-up necessary. Small low-attenuation lesion in the right kidney. No specific follow-up necessary. Kidneys are otherwise unremarkable. Ureters are decompressed. Bladder is grossly unremarkable. Stomach/Bowel: Postoperative changes in the mid and distal stomach with antral wall thickening and irregularity, hypermetabolic on PET 04/22/2024. Associated outpouching along the inferior margin of the gastric antrum contains fluid and air (2/33). Extended right hemicolectomy with an enterocolonic anastomosis in the left mid abdomen (2/32). Separate small bowel anastomosis in the high left anatomic pelvis with a large collection somewhat thick-walled fluid and air along the medial margin of the enterocolonic anastomosis, measuring 4.5 x 5.3 cm (2/46). 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 Vascular/Lymphatic: Atherosclerotic calcification of the aorta. 10 mm left periaortic lymph node (2/32), stable. Similar small to borderline enlarged periportal lymph nodes. Reproductive: No adnexal mass. Other: Scattered ascites. No free air. Mesh repair along the ventral abdominal wall. Musculoskeletal: Degenerative changes in the spine. Review of the MIP images confirms the above findings. IMPRESSION: 1. Negative for pulmonary embolus. 2. Partial gastrectomy with fold thickening, irregularity and an outpouching along the anastomotic margin, worrisome for perforation/abscess and possible disease recurrence. 3. Small bowel resection  and extended right hemicolectomy with an extraluminal collection of fluid and air associated  with an enteric anastomosis in the high left anatomic pelvis, suggestive of an anastomotic leak and abscess. Again, disease recurrence may be the underlying etiology. 4. 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 5. Healing or healed pathologic fracture of the posterior left fifth rib, as discussed on PET 04/22/2024. 6. Cholelithiasis. 7. Left adrenal mass, previously characterized as a myelolipoma. 8. Scattered ascites. 9. Aortic atherosclerosis (ICD10-I70.0). Coronary artery calcification. 10. Enlarged pulmonic trunk, indicative of pulmonary arterial hypertension. 11.  Emphysema (ICD10-J43.9). Electronically Signed   By: Newell Eke M.D.   On: 06/15/2024 16:04   CT Angio Chest PE W/Cm &/Or Wo Cm Result Date: 06/15/2024 CLINICAL DATA:  Stage IV colon cancer. Abdominal pain, back pain and nausea and vomiting. * Tracking Code: BO * EXAM: CT ANGIOGRAPHY CHEST CT ABDOMEN AND PELVIS WITH CONTRAST TECHNIQUE: Multidetector CT imaging of the chest was performed using the standard protocol during bolus administration of intravenous contrast. Multiplanar CT image reconstructions and MIPs were obtained to evaluate the vascular anatomy. Multidetector CT imaging of the abdomen and pelvis was performed using the standard protocol during bolus administration of intravenous contrast. RADIATION DOSE REDUCTION: This exam was performed according to the departmental dose-optimization program which includes automated exposure control, adjustment of the mA and/or kV according to patient size and/or use of iterative reconstruction technique. CONTRAST:  75mL OMNIPAQUE  IOHEXOL  350 MG/ML SOLN COMPARISON:  PET 04/22/2024. FINDINGS: CTA CHEST FINDINGS Cardiovascular: Negative for pulmonary embolus. Right IJ Port-A-Cath terminates in the right atrium. Atherosclerotic calcification of the aorta, aortic valve and coronary arteries. Enlarged pulmonic trunk and heart. No pericardial effusion. Mediastinum/Nodes: No  pathologically enlarged mediastinal, hilar or axillary lymph nodes. Esophagus is grossly unremarkable. Lungs/Pleura: Centrilobular and paraseptal emphysema. Vague ground-glass in the lingula (6/82), likely scarring, unchanged from 04/22/2024. Scarring in the superior segment left lower lobe. No pleural fluid. Airway is unremarkable. Musculoskeletal: Healing or healed pathologic fracture of the posterior left fifth rib, as before. Old right prick rib fractures. Review of the MIP images confirms the above findings. CT ABDOMEN and PELVIS FINDINGS Hepatobiliary: Small cysts in the inferior right hepatic lobe. No specific follow-up necessary. Gallstone. No biliary ductal dilatation. Pancreas: Negative. Spleen: Negative. Adrenals/Urinary Tract: Slight thickening of the medial limb right adrenal gland. Heterogeneous left adrenal mass measures 2.5 x 3.7 cm, previously characterized as a myelolipoma. No specific follow-up necessary. Small low-attenuation lesion in the right kidney. No specific follow-up necessary. Kidneys are otherwise unremarkable. Ureters are decompressed. Bladder is grossly unremarkable. Stomach/Bowel: Postoperative changes in the mid and distal stomach with antral wall thickening and irregularity, hypermetabolic on PET 04/22/2024. Associated outpouching along the inferior margin of the gastric antrum contains fluid and air (2/33). Extended right hemicolectomy with an enterocolonic anastomosis in the left mid abdomen (2/32). Separate small bowel anastomosis in the high left anatomic pelvis with a large collection somewhat thick-walled fluid and air along the medial margin of the enterocolonic anastomosis, measuring 4.5 x 5.3 cm (2/46). 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 Vascular/Lymphatic: Atherosclerotic calcification of the aorta. 10 mm left periaortic lymph node (2/32), stable. Similar small to borderline enlarged periportal lymph nodes. Reproductive: No adnexal mass.  Other: Scattered ascites. No free air. Mesh repair along the ventral abdominal wall. Musculoskeletal: Degenerative changes in the spine. Review of the MIP images confirms the above findings. IMPRESSION: 1. Negative for pulmonary embolus. 2. Partial gastrectomy with fold thickening, irregularity and an outpouching  along the anastomotic margin, worrisome for perforation/abscess and possible disease recurrence. 3. Small bowel resection and extended right hemicolectomy with an extraluminal collection of fluid and air associated with an enteric anastomosis in the high left anatomic pelvis, suggestive of an anastomotic leak and abscess. Again, disease recurrence may be the underlying etiology. 4. 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 5. Healing or healed pathologic fracture of the posterior left fifth rib, as discussed on PET 04/22/2024. 6. Cholelithiasis. 7. Left adrenal mass, previously characterized as a myelolipoma. 8. Scattered ascites. 9. Aortic atherosclerosis (ICD10-I70.0). Coronary artery calcification. 10. Enlarged pulmonic trunk, indicative of pulmonary arterial hypertension. 11.  Emphysema (ICD10-J43.9). Electronically Signed   By: Newell Eke M.D.   On: 06/15/2024 16:04    Microbiology: Results for orders placed or performed during the hospital encounter of 07/08/24  Blood Culture (routine x 2)     Status: None (Preliminary result)   Collection Time: 07/08/24 12:43 PM   Specimen: Porta Cath; Blood  Result Value Ref Range Status   Specimen Description PORTA CATH  Final   Special Requests   Final    BOTTLES DRAWN AEROBIC AND ANAEROBIC Blood Culture results may not be optimal due to an inadequate volume of blood received in culture bottles   Culture   Final    NO GROWTH 3 DAYS Performed at [facility], [address] [place], [place] [address]    Report Status PENDING  Incomplete  Blood Culture (routine x 2)     Status: None (Preliminary result)    Collection Time: 07/08/24 12:43 PM   Specimen: BLOOD RIGHT ARM  Result Value Ref Range Status   Specimen Description BLOOD RIGHT ARM  Final   Special Requests   Final    BOTTLES DRAWN AEROBIC AND ANAEROBIC Blood Culture results may not be optimal due to an inadequate volume of blood received in culture bottles   Culture   Final    NO GROWTH 3 DAYS Performed at [facility], [address] [place], [place] [address]    Report Status PENDING  Incomplete    Labs: CBC: Recent Labs  Lab 07/08/24 1127 07/09/24 0350 07/10/24 0441 07/11/24 0449  WBC 20.2* 12.6* 12.2* 12.0*  NEUTROABS 18.0*  --   --   --   HGB 9.0* 7.6* 7.3* 7.6*  HCT 29.6* 24.7* 24.4* 25.3*  MCV 83.6 83.7 85.0 84.9  PLT 521* 351 327 353   Basic Metabolic Panel: Recent Labs  Lab 07/08/24 1127 07/09/24 0350 07/10/24 0440 07/10/24 0441 07/11/24 0449  NA 143 143  --  137 141  K 4.4 4.1  --  3.1* 4.2  CL 105 109  --  106 108  CO2 22 25  --  20* 23  GLUCOSE 180* 103*  --  104* 131*  BUN 25* 19  --  16 20  CREATININE 0.95 0.77  --  0.65 0.91  CALCIUM  9.1 8.2*  --  7.8* 8.6*  MG 1.4* 1.7 1.4*  --  2.0  PHOS 4.5  --   --   --   --    Liver Function Tests: Recent Labs  Lab 07/08/24 1127  AST 19  ALT 13  ALKPHOS 53  BILITOT 0.7  PROT 6.4*  ALBUMIN 2.8*   CBG: Recent Labs  Lab 07/11/24 1127 07/11/24 1535 07/11/24 2106 07/12/24 0828 07/12/24 1135  GLUCAP 161* 95 135* 122* 89    Discharge time spent: greater than 30 minutes.  Signed: Laree Lock,  MD [facility] 07/12/2024

## 2024-07-13 ENCOUNTER — Other Ambulatory Visit (HOSPITAL_COMMUNITY): Payer: Self-pay

## 2024-07-13 LAB — CULTURE, BLOOD (ROUTINE X 2)
Culture: NO GROWTH
Culture: NO GROWTH

## 2024-07-15 ENCOUNTER — Encounter

## 2024-07-19 ENCOUNTER — Other Ambulatory Visit (HOSPITAL_COMMUNITY): Payer: Self-pay

## 2024-07-19 ENCOUNTER — Encounter (HOSPITAL_COMMUNITY): Payer: Self-pay | Admitting: Pharmacy Technician

## 2024-07-19 NOTE — Telephone Encounter (Signed)
 ERROR

## 2024-08-16 ENCOUNTER — Telehealth: Admitting: Hospice and Palliative Medicine

## 2024-08-31 DEATH — deceased
